# Patient Record
Sex: Female | Born: 1945 | Race: White | Hispanic: No | Smoking: Former smoker
Health system: Southern US, Community
[De-identification: ages and names within clinical notes are randomized; demographics above are authoritative.]

## PROBLEM LIST (undated history)

## (undated) DIAGNOSIS — J189 Pneumonia, unspecified organism: Secondary | ICD-10-CM

## (undated) DIAGNOSIS — F329 Major depressive disorder, single episode, unspecified: Secondary | ICD-10-CM

## (undated) DIAGNOSIS — M1990 Unspecified osteoarthritis, unspecified site: Secondary | ICD-10-CM

## (undated) DIAGNOSIS — T8859XA Other complications of anesthesia, initial encounter: Secondary | ICD-10-CM

## (undated) DIAGNOSIS — E785 Hyperlipidemia, unspecified: Secondary | ICD-10-CM

## (undated) DIAGNOSIS — Z9989 Dependence on other enabling machines and devices: Secondary | ICD-10-CM

## (undated) DIAGNOSIS — D369 Benign neoplasm, unspecified site: Secondary | ICD-10-CM

## (undated) DIAGNOSIS — E041 Nontoxic single thyroid nodule: Secondary | ICD-10-CM

## (undated) DIAGNOSIS — M5030 Other cervical disc degeneration, unspecified cervical region: Secondary | ICD-10-CM

## (undated) DIAGNOSIS — G4733 Obstructive sleep apnea (adult) (pediatric): Secondary | ICD-10-CM

## (undated) DIAGNOSIS — F32A Depression, unspecified: Secondary | ICD-10-CM

## (undated) DIAGNOSIS — G8929 Other chronic pain: Secondary | ICD-10-CM

## (undated) DIAGNOSIS — Z9181 History of falling: Secondary | ICD-10-CM

## (undated) DIAGNOSIS — T4145XA Adverse effect of unspecified anesthetic, initial encounter: Secondary | ICD-10-CM

## (undated) DIAGNOSIS — M549 Dorsalgia, unspecified: Secondary | ICD-10-CM

## (undated) DIAGNOSIS — S060X9A Concussion with loss of consciousness of unspecified duration, initial encounter: Secondary | ICD-10-CM

## (undated) DIAGNOSIS — E119 Type 2 diabetes mellitus without complications: Secondary | ICD-10-CM

## (undated) DIAGNOSIS — K219 Gastro-esophageal reflux disease without esophagitis: Secondary | ICD-10-CM

## (undated) DIAGNOSIS — S060XAA Concussion with loss of consciousness status unknown, initial encounter: Secondary | ICD-10-CM

## (undated) DIAGNOSIS — I4891 Unspecified atrial fibrillation: Secondary | ICD-10-CM

## (undated) DIAGNOSIS — R42 Dizziness and giddiness: Secondary | ICD-10-CM

## (undated) DIAGNOSIS — Z8489 Family history of other specified conditions: Secondary | ICD-10-CM

## (undated) DIAGNOSIS — K900 Celiac disease: Secondary | ICD-10-CM

## (undated) DIAGNOSIS — K811 Chronic cholecystitis: Secondary | ICD-10-CM

## (undated) HISTORY — DX: Hyperlipidemia, unspecified: E78.5

## (undated) HISTORY — DX: Celiac disease: K90.0

## (undated) HISTORY — PX: CARPAL TUNNEL RELEASE: SHX101

## (undated) HISTORY — PX: BACK SURGERY: SHX140

## (undated) HISTORY — DX: Benign neoplasm, unspecified site: D36.9

## (undated) HISTORY — DX: Other cervical disc degeneration, unspecified cervical region: M50.30

## (undated) HISTORY — DX: Unspecified atrial fibrillation: I48.91

## (undated) HISTORY — DX: Gastro-esophageal reflux disease without esophagitis: K21.9

## (undated) HISTORY — DX: Nontoxic single thyroid nodule: E04.1

## (undated) HISTORY — PX: CATARACT EXTRACTION, BILATERAL: SHX1313

---

## 1979-02-09 HISTORY — PX: TUBAL LIGATION: SHX77

## 1983-05-05 HISTORY — PX: TONSILLECTOMY: SUR1361

## 2005-05-04 HISTORY — PX: LUMBAR DISC SURGERY: SHX700

## 2007-03-22 ENCOUNTER — Encounter: Admission: RE | Admit: 2007-03-22 | Discharge: 2007-03-22 | Payer: Self-pay | Admitting: Family Medicine

## 2009-11-01 DIAGNOSIS — F411 Generalized anxiety disorder: Secondary | ICD-10-CM | POA: Insufficient documentation

## 2010-05-24 ENCOUNTER — Encounter: Payer: Self-pay | Admitting: Family Medicine

## 2011-06-19 DIAGNOSIS — E119 Type 2 diabetes mellitus without complications: Secondary | ICD-10-CM | POA: Insufficient documentation

## 2012-01-25 ENCOUNTER — Other Ambulatory Visit: Payer: Self-pay | Admitting: Nurse Practitioner

## 2012-01-25 DIAGNOSIS — Z1231 Encounter for screening mammogram for malignant neoplasm of breast: Secondary | ICD-10-CM

## 2012-02-01 ENCOUNTER — Ambulatory Visit
Admission: RE | Admit: 2012-02-01 | Discharge: 2012-02-01 | Disposition: A | Discharge status: Home / Self Care | Payer: PRIVATE HEALTH INSURANCE | Source: Ambulatory Visit | Attending: Nurse Practitioner | Admitting: Nurse Practitioner

## 2012-02-01 DIAGNOSIS — Z1231 Encounter for screening mammogram for malignant neoplasm of breast: Secondary | ICD-10-CM

## 2013-05-04 HISTORY — PX: ANTERIOR CERVICAL DECOMP/DISCECTOMY FUSION: SHX1161

## 2013-07-10 ENCOUNTER — Other Ambulatory Visit: Payer: Self-pay

## 2013-07-11 ENCOUNTER — Other Ambulatory Visit: Payer: Self-pay | Admitting: *Deleted

## 2013-07-11 DIAGNOSIS — N644 Mastodynia: Secondary | ICD-10-CM

## 2013-07-13 ENCOUNTER — Ambulatory Visit
Admission: RE | Admit: 2013-07-13 | Discharge: 2013-07-13 | Disposition: A | Discharge status: Home / Self Care | Payer: Medicare Other | Source: Ambulatory Visit | Attending: *Deleted | Admitting: *Deleted

## 2013-07-13 DIAGNOSIS — N644 Mastodynia: Secondary | ICD-10-CM

## 2013-10-27 DIAGNOSIS — G4733 Obstructive sleep apnea (adult) (pediatric): Secondary | ICD-10-CM | POA: Insufficient documentation

## 2013-12-04 ENCOUNTER — Ambulatory Visit: Payer: Medicare Other | Attending: Orthopaedic Surgery

## 2013-12-04 DIAGNOSIS — G4730 Sleep apnea, unspecified: Secondary | ICD-10-CM | POA: Diagnosis not present

## 2013-12-04 DIAGNOSIS — M79609 Pain in unspecified limb: Secondary | ICD-10-CM | POA: Diagnosis not present

## 2013-12-04 DIAGNOSIS — Z981 Arthrodesis status: Secondary | ICD-10-CM | POA: Diagnosis not present

## 2013-12-04 DIAGNOSIS — E119 Type 2 diabetes mellitus without complications: Secondary | ICD-10-CM | POA: Diagnosis not present

## 2013-12-04 DIAGNOSIS — R5381 Other malaise: Secondary | ICD-10-CM | POA: Insufficient documentation

## 2013-12-04 DIAGNOSIS — IMO0001 Reserved for inherently not codable concepts without codable children: Secondary | ICD-10-CM | POA: Diagnosis present

## 2013-12-04 DIAGNOSIS — R262 Difficulty in walking, not elsewhere classified: Secondary | ICD-10-CM | POA: Insufficient documentation

## 2013-12-04 DIAGNOSIS — M25579 Pain in unspecified ankle and joints of unspecified foot: Secondary | ICD-10-CM | POA: Diagnosis not present

## 2013-12-06 ENCOUNTER — Ambulatory Visit: Payer: Medicare Other | Admitting: Physical Therapy

## 2013-12-13 ENCOUNTER — Ambulatory Visit: Payer: Medicare Other | Admitting: Physical Therapy

## 2013-12-13 DIAGNOSIS — IMO0001 Reserved for inherently not codable concepts without codable children: Secondary | ICD-10-CM | POA: Diagnosis not present

## 2013-12-26 ENCOUNTER — Ambulatory Visit: Payer: Medicare Other

## 2013-12-26 DIAGNOSIS — IMO0001 Reserved for inherently not codable concepts without codable children: Secondary | ICD-10-CM | POA: Diagnosis not present

## 2013-12-29 ENCOUNTER — Ambulatory Visit: Payer: Medicare Other | Admitting: Physical Therapy

## 2013-12-29 DIAGNOSIS — IMO0001 Reserved for inherently not codable concepts without codable children: Secondary | ICD-10-CM | POA: Diagnosis not present

## 2014-01-02 ENCOUNTER — Ambulatory Visit: Payer: Medicare Other | Attending: Orthopaedic Surgery | Admitting: Physical Therapy

## 2014-01-02 DIAGNOSIS — M79609 Pain in unspecified limb: Secondary | ICD-10-CM | POA: Insufficient documentation

## 2014-01-02 DIAGNOSIS — R5381 Other malaise: Secondary | ICD-10-CM | POA: Diagnosis not present

## 2014-01-02 DIAGNOSIS — R262 Difficulty in walking, not elsewhere classified: Secondary | ICD-10-CM | POA: Insufficient documentation

## 2014-01-02 DIAGNOSIS — IMO0001 Reserved for inherently not codable concepts without codable children: Secondary | ICD-10-CM | POA: Diagnosis present

## 2014-01-02 DIAGNOSIS — G4730 Sleep apnea, unspecified: Secondary | ICD-10-CM | POA: Insufficient documentation

## 2014-01-02 DIAGNOSIS — E119 Type 2 diabetes mellitus without complications: Secondary | ICD-10-CM | POA: Insufficient documentation

## 2014-01-02 DIAGNOSIS — Z981 Arthrodesis status: Secondary | ICD-10-CM | POA: Diagnosis not present

## 2014-01-02 DIAGNOSIS — M25579 Pain in unspecified ankle and joints of unspecified foot: Secondary | ICD-10-CM | POA: Diagnosis not present

## 2014-03-06 ENCOUNTER — Other Ambulatory Visit: Payer: Self-pay | Admitting: Physician Assistant

## 2014-03-06 DIAGNOSIS — R5381 Other malaise: Secondary | ICD-10-CM

## 2014-03-07 ENCOUNTER — Other Ambulatory Visit: Payer: Self-pay | Admitting: Physician Assistant

## 2014-03-07 DIAGNOSIS — Z780 Asymptomatic menopausal state: Secondary | ICD-10-CM

## 2014-05-25 ENCOUNTER — Other Ambulatory Visit: Payer: Medicare Other

## 2014-06-12 ENCOUNTER — Ambulatory Visit
Admission: RE | Admit: 2014-06-12 | Discharge: 2014-06-12 | Disposition: A | Discharge status: Home / Self Care | Payer: Medicare Other | Source: Ambulatory Visit | Attending: Physician Assistant | Admitting: Physician Assistant

## 2014-06-12 DIAGNOSIS — Z780 Asymptomatic menopausal state: Secondary | ICD-10-CM

## 2014-08-03 ENCOUNTER — Other Ambulatory Visit: Payer: Self-pay

## 2014-08-03 DIAGNOSIS — Z1231 Encounter for screening mammogram for malignant neoplasm of breast: Secondary | ICD-10-CM

## 2014-09-05 ENCOUNTER — Ambulatory Visit: Payer: Medicare Other

## 2014-10-03 ENCOUNTER — Ambulatory Visit: Payer: Medicare Other

## 2014-10-30 ENCOUNTER — Ambulatory Visit
Admission: RE | Admit: 2014-10-30 | Discharge: 2014-10-30 | Disposition: A | Discharge status: Home / Self Care | Payer: Medicare Other | Source: Ambulatory Visit

## 2014-10-30 DIAGNOSIS — Z1231 Encounter for screening mammogram for malignant neoplasm of breast: Secondary | ICD-10-CM

## 2014-11-09 ENCOUNTER — Other Ambulatory Visit: Payer: Self-pay | Admitting: Neurosurgery

## 2014-11-09 DIAGNOSIS — M5126 Other intervertebral disc displacement, lumbar region: Secondary | ICD-10-CM

## 2014-11-13 ENCOUNTER — Ambulatory Visit
Admission: RE | Admit: 2014-11-13 | Discharge: 2014-11-13 | Disposition: A | Discharge status: Home / Self Care | Payer: Medicare Other | Source: Ambulatory Visit | Attending: Neurosurgery | Admitting: Neurosurgery

## 2014-11-13 DIAGNOSIS — M5126 Other intervertebral disc displacement, lumbar region: Secondary | ICD-10-CM

## 2014-11-13 MED ORDER — GADOBENATE DIMEGLUMINE 529 MG/ML IV SOLN
15.0000 mL | Freq: Once | INTRAVENOUS | Status: AC | PRN
  Administered 2014-11-13: 15 mL via INTRAVENOUS

## 2014-12-13 ENCOUNTER — Ambulatory Visit: Payer: Medicare Other | Attending: Neurosurgery

## 2014-12-13 DIAGNOSIS — M25659 Stiffness of unspecified hip, not elsewhere classified: Secondary | ICD-10-CM | POA: Diagnosis present

## 2014-12-13 DIAGNOSIS — M5441 Lumbago with sciatica, right side: Secondary | ICD-10-CM

## 2014-12-13 NOTE — Patient Instructions (Signed)
Perform all exercises below:  Hold _20___ seconds. Repeat _3___ times.  Do __3__ sessions per day. CAUTION: Movement should be gentle, steady and slow.  Knee to Chest  Lying supine, bend involved knee to chest. Perform with each leg.  Copyright  VHI. All rights reserved.  Double Knee to Chest (Flexion)   Gently pull both knees toward chest. Feel stretch in lower back or buttock area. Breathing deeply, Lumbar Rotation: Caudal - Bilateral (Supine)  Feet and knees together, arms outstretched, rotate knees left, turning head in opposite direction, until stretch is felt.      HIP: Hamstrings - Short Sitting   Rest leg on raised surface. Keep knee straight. Lift chest.   [facility] [address], [place], [place] [address] Phone # [phone] Fax [phone]

## 2014-12-13 NOTE — Therapy (Signed)
[facility] [address] [place], [place], [address] Phone: [phone]   Fax:  [phone]  Physical Therapy Evaluation  Patient Details  Name: Krista Austin MRN: [id number] Date of Birth: 1946/02/11 Referring Provider:  Kary Kos, MD  Encounter Date: 12/13/2014      PT End of Session - 12/13/14 1300    Visit Number 1   Number of Visits 10   Date for PT Re-Evaluation 02/07/15   PT Start Time 9417   PT Stop Time 1310   PT Time Calculation (min) 39 min   Activity Tolerance Patient tolerated treatment well   Behavior During Therapy [facility] for tasks assessed/performed      Past Medical History  Diagnosis Date  . Cataract   . Hyperlipidemia     Past Surgical History  Procedure Laterality Date  . Spine surgery  2007    lumbar  . Spine surgery  2015    cervical fusion    There were no vitals filed for this visit.  Visit Diagnosis:  Bilateral low back pain with right-sided sciatica - Plan: PT plan of care cert/re-cert  Hip stiffness, unspecified laterality - Plan: PT plan of care cert/re-cert      Subjective Assessment - 12/13/14 1230    Subjective Pt presents to PT with LBP and Rt LE radiculopathy.  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  MD recommended injection but pt does not want to do this due to her diabetes.     Patient is accompained by: Family member   Pertinent History Lumbar surgery 2007, neck surgery 2015   Limitations Standing;Walking   How long can you stand comfortably? 1 hour max with housework   How long can you walk comfortably? walking aggravates the pain   Diagnostic tests see MRI results   Currently in Pain? Yes   Pain Score 4   2-4/10   Pain Location Back   Pain Orientation Right;Lower;Lateral   Pain Descriptors / Indicators Constant;Burning;Tingling   Pain Type Chronic pain   Pain Radiating Towards Rt LE-nerve  pain (burning, tingling)   Pain Onset More than a month ago   Pain Frequency Constant   Aggravating Factors  standing, walking, standing on Rt LE   Pain Relieving Factors sitting down, Advil   Effect of Pain on Daily Activities difficulty with preparing meals, not able to walk for exercise, limited community function due to pain   Multiple Pain Sites No            [facility] PT Assessment - 12/13/14 0001    Assessment   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 2  unexplained, just happened   Has the patient had a decrease in activity level because of a fear of falling?  No   Is the patient reluctant to leave their home because of a fear of falling?  No   Home Environment   Living Environment Private residence   Living Arrangements Other relatives   Type of [place] Two level   Prior Function   Level of Independence Independent   Vocation Retired  Vocation Requirements Pt helps to care for her grandchildren.  Pt has an adult child with Down's Syndrome that she cares for.   Cognition   Overall Cognitive Status Within Functional Limits for tasks assessed   Observation/Other Assessments   Focus on Therapeutic Outcomes (FOTO)  50% limitation   Posture/Postural Control   Posture/Postural Control Postural limitations   Postural Limitations Rounded Shoulders;Forward head;Decreased lumbar lordosis   ROM / Strength   AROM / PROM / Strength AROM;PROM;Strength   AROM   Overall AROM  Within functional limits for tasks performed   Overall AROM Comments Pt with full lumbar AROM with Rt lumbar pain reported with Lt sidbending and flexion   PROM   Overall PROM  Deficits   Overall PROM Comments Bil. hip flexibility limited  by 50% in all directions without pain   Strength   Overall Strength Within functional limits for tasks performed   Overall Strength Comments 4+/5 to 5/5 bilateral LE strength   Palpation   Spinal mobility reduced PA mobility with pain T3-L5.     Palpation comment Pt with tenderness and muscle tension over bilateral thoracic, lumbar and deep gluteal musculaure   Special Tests    Special Tests Lumbar   Lumbar Tests Slump Test;Straight Leg Raise   Slump test   Findings Negative   Side Right   Straight Leg Raise   Findings Negative   Side  Right   Ambulation/Gait   Ambulation/Gait Yes   Ambulation/Gait Assistance 7: Independent                           PT Education - 12/13/14 1259    Education provided Yes   Education Details HEP: lumbar flexibility exercises,  discussed home TENs   Person(s) Educated Patient   Methods Explanation;Demonstration;Handout   Comprehension Verbalized understanding;Returned demonstration          PT Short Term Goals - 12/13/14 1315    PT SHORT TERM GOAL #1   Title be independent in initial HEP   Time 4   Period Weeks   Status New   PT SHORT TERM GOAL #2   Title report a 30% reduction in LBP/LE pain with standing to perform cooking and housework   Time 4   Period Weeks   Status New   PT SHORT TERM GOAL #3   Title resume regular walking program for exercise without increase in pain   Time 4   Period Weeks   Status New           PT Long Term Goals - 12/13/14 1318    PT LONG TERM GOAL #1   Title be independent in advanced HEP   Time 8   Period Weeks   Status New   PT LONG TERM GOAL #2   Title reduce FOTO to < or = to 45% limitation   Time 8   Period Weeks   Status New   PT LONG TERM GOAL #3   Title report a 60% reduction in LBP/LE pain with housework and cooking tasks   Time 8   Period Weeks   Status New   PT LONG TERM GOAL #4   Title walk in the community x30-45 minutes for shopping/errands with < or = to  2/10 LBP   Time 8   Period Weeks   Status New               Plan - 12/13/14 1300  Clinical Impression Statement Pt is a y.o. female who presents to PT with chronic history of LBP and radiculopathy.  Recent MRI indicated lumbar degeneration, nerve root impingement and stenosis.  Pt demonstrates bilateral hip stiffness, painful lumbar flexiblity and reduced spinal segmental mobility.  FOTO score is 50% limitation and pt is limited to standing long periods and performing home and community function.  Pt will beneit from PT for flexiblity, core stability, body mechanics educaiton, and modalities/manual PRN   Pt will benefit from skilled therapeutic intervention in order to improve on the following deficits Hypomobility;Pain;Decreased activity tolerance;Impaired flexibility   Rehab Potential Good   PT Frequency 2x / week   PT Duration 8 weeks   PT Treatment/Interventions ADLs/Self Care Home Management;Cryotherapy;Electrical Stimulation;Moist Heat;Traction;Ultrasound;Patient/family education;Neuromuscular re-education;Therapeutic exercise;Therapeutic activities;Manual techniques;Passive range of motion   PT Next Visit Plan body mechanics, decompression exercise, hip and lumbar flexibility, try e-stim and heat   Consulted and Agree with Plan of Care Family member/caregiver   Family Member Consulted Pt's son          G-Codes - Dec 26, 2014 1226    Functional Assessment Tool Used FOTO: 50% limitation   Functional Limitation Other PT primary   Other PT Primary Current Status (M0802) At least 40 percent but less than 60 percent impaired, limited or restricted   Other PT Primary Goal Status (M3361) At least 40 percent but less than 60 percent impaired, limited or restricted       Problem List There are no active problems to display for this patient.   TAKACS,KELLY, PT December 26, 2014, 1:22 PM  [facility] [address], [place], [place], [address] Phone: [phone]   Fax:  [phone]

## 2014-12-19 ENCOUNTER — Ambulatory Visit: Payer: Medicare Other

## 2014-12-27 ENCOUNTER — Encounter: Payer: Medicare Other | Admitting: Physical Therapy

## 2015-01-11 ENCOUNTER — Ambulatory Visit: Payer: Medicare Other | Admitting: Physical Therapy

## 2015-01-14 ENCOUNTER — Encounter: Payer: Medicare Other | Admitting: Physical Therapy

## 2015-01-16 ENCOUNTER — Ambulatory Visit: Payer: Medicare Other | Attending: Neurosurgery

## 2015-01-16 DIAGNOSIS — M25659 Stiffness of unspecified hip, not elsewhere classified: Secondary | ICD-10-CM | POA: Diagnosis present

## 2015-01-16 DIAGNOSIS — R51 Headache: Secondary | ICD-10-CM | POA: Diagnosis present

## 2015-01-16 DIAGNOSIS — M542 Cervicalgia: Secondary | ICD-10-CM | POA: Diagnosis present

## 2015-01-16 DIAGNOSIS — R293 Abnormal posture: Secondary | ICD-10-CM | POA: Insufficient documentation

## 2015-01-16 DIAGNOSIS — M5441 Lumbago with sciatica, right side: Secondary | ICD-10-CM

## 2015-01-16 NOTE — Therapy (Signed)
[facility] [address] [place], [place], [address] Phone: [phone]   Fax:  [phone]  Physical Therapy Treatment  Patient Details  Name: Krista Austin MRN: [id number] Date of Birth: [birth year] Referring Provider:  Kary Kos, MD  Encounter Date: 01/16/2015      PT End of Session - 01/16/15 1214    Visit Number 2   Number of Visits 10   Date for PT Re-Evaluation 02/07/15   PT Start Time 5638   PT Stop Time 7564   PT Time Calculation (min) 50 min   Activity Tolerance Patient tolerated treatment well   Behavior During Therapy [facility] for tasks assessed/performed      Past Medical History  Diagnosis Date  . Cataract   . Hyperlipidemia     Past Surgical History  Procedure Laterality Date  . Spine surgery  2007    lumbar  . Spine surgery  2015    cervical fusion    There were no vitals filed for this visit.  Visit Diagnosis:  Bilateral low back pain with right-sided sciatica  Hip stiffness, unspecified laterality      Subjective Assessment - 01/16/15 1142    Subjective Pt has not been able to attend PT due to death in the family and then had elevated A1C.     Currently in Pain? Yes   Pain Score 2    Pain Location Back   Pain Orientation Right;Lateral;Lower   Pain Descriptors / Indicators Constant;Burning;Tingling   Pain Radiating Towards Rt LE nerve pain (burning, tingling)   Pain Onset More than a month ago   Pain Frequency Constant   Aggravating Factors  standing, walking, standing on Rt LE   Pain Relieving Factors sitting down, Advil                         [facility] Adult PT Treatment/Exercise - 01/16/15 0001    Exercises   Exercises Lumbar;Knee/Hip   Lumbar Exercises: Stretches   Active Hamstring Stretch 3 reps;20 seconds   Single Knee to Chest Stretch 3 reps;20 seconds   Double Knee to Chest Stretch 3 reps;20 seconds   Lower Trunk Rotation 3 reps;20 seconds   Knee/Hip  Exercises: Aerobic   Nustep Level 1 x 8 minutes  seat 7, arms 10   Modalities   Modalities Electrical Stimulation;Ultrasound;Moist Heat   Moist Heat Therapy   Number Minutes Moist Heat 15 Minutes   Moist Heat Location Lumbar Spine   Electrical Stimulation   Electrical Stimulation Location Rt low back/SI joint   Electrical Stimulation Action IFC   Electrical Stimulation Parameters 15 min   Electrical Stimulation Goals Pain   Ultrasound   Ultrasound Location Rt SI joint/lumbar paraspinals   Ultrasound Parameters 1.3 w/cm2 contx 8 mintues   Ultrasound Goals Pain                  PT Short Term Goals - 01/16/15 1148    PT SHORT TERM GOAL #1   Title be independent in initial HEP   Time 4   Period Weeks   Status Achieved   PT SHORT TERM GOAL #2   Title report a 30% reduction in LBP/LE pain with standing to perform cooking and housework   Time 4   Period Weeks   Status On-going  no change due to inability to to HEP or attend PT   PT SHORT TERM GOAL #3   Title resume regular  walking program for exercise without increase in pain   Time 4   Period Weeks   Status On-going  walking shorter distances more frequently but still limited by pain           PT Long Term Goals - 24-Dec-2014 1318    PT LONG TERM GOAL #1   Title be independent in advanced HEP   Time 8   Period Weeks   Status New   PT LONG TERM GOAL #2   Title reduce FOTO to < or = to 45% limitation   Time 8   Period Weeks   Status New   PT LONG TERM GOAL #3   Title report a 60% reduction in LBP/LE pain with housework and cooking tasks   Time 8   Period Weeks   Status New   PT LONG TERM GOAL #4   Title walk in the community x30-45 minutes for shopping/errands with < or = to 2/10 LBP   Time 8   Period Weeks   Status New               Plan - Jan 27, 2015 1150    Clinical Impression Statement Pt with limited attendance 12-24-2014-2014/01/26 due to death in the familiy and other medical issues.  Pt  reports limited ability to perform HEP.  Pt with continued Rt LE and low back pain.  Pt will benefit from skilled PT for flexibility, core stability, body mechanics educaiton and modalities/manual.     Pt will benefit from skilled therapeutic intervention in order to improve on the following deficits Hypomobility;Pain;Decreased activity tolerance;Impaired flexibility   Rehab Potential Good   PT Frequency 2x / week   PT Duration 8 weeks   PT Treatment/Interventions ADLs/Self Care Home Management;Cryotherapy;Electrical Stimulation;Moist Heat;Traction;Ultrasound;Patient/family education;Neuromuscular re-education;Therapeutic exercise;Therapeutic activities;Manual techniques;Passive range of motion   PT Next Visit Plan body mechanics, decompression exercise, hip and lumbar flexibility, modalities PRN   Consulted and Agree with Plan of Care Patient        Problem List There are no active problems to display for this patient.   Deanna Boehlke, PT 27-Jan-2015, 12:21 PM  [facility] [address], [place], [place], [address] Phone: [phone]   Fax:  [phone]

## 2015-01-17 ENCOUNTER — Encounter: Payer: Medicare Other | Admitting: Physical Therapy

## 2015-01-18 ENCOUNTER — Other Ambulatory Visit: Payer: Self-pay | Admitting: Neurosurgery

## 2015-01-18 DIAGNOSIS — M5023 Other cervical disc displacement, cervicothoracic region: Secondary | ICD-10-CM

## 2015-01-22 ENCOUNTER — Ambulatory Visit: Payer: Medicare Other

## 2015-01-22 DIAGNOSIS — R293 Abnormal posture: Secondary | ICD-10-CM

## 2015-01-22 DIAGNOSIS — M5441 Lumbago with sciatica, right side: Secondary | ICD-10-CM | POA: Diagnosis not present

## 2015-01-22 DIAGNOSIS — M542 Cervicalgia: Secondary | ICD-10-CM

## 2015-01-22 DIAGNOSIS — R519 Headache, unspecified: Secondary | ICD-10-CM

## 2015-01-22 DIAGNOSIS — R51 Headache: Secondary | ICD-10-CM

## 2015-01-22 DIAGNOSIS — M25659 Stiffness of unspecified hip, not elsewhere classified: Secondary | ICD-10-CM

## 2015-01-22 NOTE — Therapy (Signed)
[facility] [address], [place], [place], [address] Phone: [phone]   Fax:  [phone]  Physical Therapy Treatment  Patient Details  Name: Krista Austin MRN: [id number] Date of Birth: 05/20/1945 Referring Provider:  Kary Kos, MD  Encounter Date: 01/22/2015      PT End of Session - 01/22/15 1304    Visit Number 3   Number of Visits 10   Date for PT Re-Evaluation 03/19/15   PT Start Time 7253   PT Stop Time 1315   PT Time Calculation (min) 44 min   Activity Tolerance Patient tolerated treatment well   Behavior During Therapy [facility] for tasks assessed/performed      Past Medical History  Diagnosis Date  . Cataract   . Hyperlipidemia     Past Surgical History  Procedure Laterality Date  . Spine surgery  2007    lumbar  . Spine surgery  2015    cervical fusion    There were no vitals filed for this visit.  Visit Diagnosis:  Bilateral low back pain with right-sided sciatica - Plan: PT plan of care cert/re-cert  Hip stiffness, unspecified laterality - Plan: PT plan of care cert/re-cert  Cervicalgia - Plan: PT plan of care cert/re-cert  Posture abnormality - Plan: PT plan of care cert/re-cert  Bilateral headaches - Plan: PT plan of care cert/re-cert      Subjective Assessment - 01/22/15 1225    Subjective Order received for cervical spine.  Pt with chronic neck pain and has fusion last year.  Pt will have neck MRI next week   Pertinent History Lumbar surgery 2007, neck surgery 2015   Diagnostic tests MRI of neck scheduled for 01/30/15   Patient Stated Goals reduce pain, reduce headaches   Currently in Pain? Yes   Pain Score 1    Pain Orientation Right;Left;Lower   Pain Descriptors / Indicators Constant;Burning;Tingling   Pain Type Chronic pain   Pain Onset More than a month ago   Pain Frequency Constant   Aggravating Factors  standing, walking, standing on Rt LE   Pain Relieving Factors  sitting down, Advil   Multiple Pain Sites Yes   Pain Score 1   Pain Location Neck   Pain Orientation Right;Left;Mid   Pain Descriptors / Indicators Aching;Burning   Pain Radiating Towards between shoulder blades.  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 2  unexplained   Has the patient had a decrease in activity level because of a fear of falling?  No   Is the patient reluctant to leave their home because of a fear of falling?  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  Pt has an adult child with Down's Syndrome that she cares for.   Cognition   Overall Cognitive Status Within Functional Limits for tasks assessed   Observation/Other Assessments   Focus on Therapeutic Outcomes (FOTO)  54% limitation  neck   Posture/Postural Control   Posture/Postural Control Postural limitations   Postural Limitations Rounded Shoulders;Forward head;Decreased lumbar lordosis   ROM / Strength   AROM / PROM / Strength AROM;Strength   AROM   Overall AROM  Deficits   Overall AROM Comments Cervical AROM is limited by 25% in all directions and pt reports feeling dizzy with head motion   Strength   Overall Strength Within functional limits for tasks performed   Overall Strength Comments 4+/5 bilateral UE strength    Palpation   Spinal mobility reduced PA mobilty C4-T3   Palpation comment tension and active trigger point over bilateral UT and cervical paraspinals                     [facility] Adult PT Treatment/Exercise - 01/22/15 0001    Modalities   Modalities Electrical Stimulation;Moist Heat   Moist Heat Therapy   Number Minutes Moist Heat 15 Minutes   Moist Heat Location Lumbar Spine;Cervical   Electrical Stimulation   Electrical Stimulation Location bil low back and cervical paraspinals   Electrical Stimulation Action IFC   Electrical Stimulation Parameters 15 min   Electrical Stimulation Goals Pain                PT Education - 01/22/15 1250    Education provided Yes   Education Details HEP: cervical AROM, posture education   Person(s) Educated Patient   Methods Demonstration;Explanation;Handout   Comprehension Verbalized understanding;Returned demonstration          PT Short Term Goals - 01/22/15 1235    PT SHORT TERM GOAL #1   Title be independent in initial HEP   Status Achieved   PT SHORT TERM GOAL #2   Title report a 30% reduction in LBP/LE pain with standing to perform cooking and housework   Time 4   Period Weeks   Status Achieved   PT SHORT TERM GOAL #3   Title resume regular walking program for exercise without increase in pain   Status Achieved  short bouts           PT Long Term Goals - 01/22/15 1236    PT LONG TERM GOAL #1   Title be independent in advanced HEP   Time 8   Period Weeks   Status On-going   PT LONG TERM GOAL #2   Title reduce FOTO to < or = to 45% limitation for lumbar spine   Time 8   Period Weeks   Status On-going   PT LONG TERM GOAL #3   Title report a 60% reduction in LBP/LE pain with housework and cooking tasks   Time 8   Period Weeks   Status On-going  50% report   PT LONG TERM GOAL #4   Title walk in the community x30-45 minutes for shopping/errands with < or = to 2/10 LBP   Time 8   Period Weeks    Status On-going  LBP 3-4/10 after an hour   PT LONG TERM GOAL #5   Title report a 50% reduction in the frequency and intensity of headaches    Time 8   Period Weeks   Status New   Additional Long Term Goals  Additional Long Term Goals Yes   PT LONG TERM GOAL #6   Title reduce FOTO to < or 47% limitation (neck)   Time 8   Period Weeks   Status New               Plan - 02/14/15 1304    Clinical Impression Statement Pt presents to PT with chronic neck pain and headaches that have recently worsened with onset of some dizziness.  Pt will have MRI next week.  Pt also wit hcontniued Rt LE pain and low back pain that is 50% improved since the start of care.  Pt demonstrates postural abnormality and weakness and limited cervical AROM.  FOTO for neck is 54% limitaiton.  Pt will benefit from skilled PT for cervical pain management, postural strength and advancement of exercise for low back/Rt LE pain as needed.     Pt will benefit from skilled therapeutic intervention in order to improve on the following deficits Hypomobility;Pain;Decreased activity tolerance;Impaired flexibility;Postural dysfunction;Decreased strength   Rehab Potential Good   PT Frequency 2x / week   PT Duration 8 weeks   PT Treatment/Interventions ADLs/Self Care Home Management;Cryotherapy;Electrical Stimulation;Moist Heat;Traction;Ultrasound;Patient/family education;Neuromuscular re-education;Therapeutic exercise;Therapeutic activities;Manual techniques;Passive range of motion   PT Next Visit Plan  decompression exercise, postural strength, hip and lumbar flexibility, modalities PRN   Consulted and Agree with Plan of Care Patient          G-Codes - 2015-02-14 1227    Functional Assessment Tool Used FOTO neck: 54% limitation   Functional Limitation Other PT subsequent   Other PT Secondary Current Status (D1761) At least 40 percent but less than 60 percent impaired, limited or restricted   Other PT Secondary Goal  Status (Y0737) At least 40 percent but less than 60 percent impaired, limited or restricted   Other PT Secondary Discharge Status (T0626) At least 40 percent but less than 60 percent impaired, limited or restricted      Problem List There are no active problems to display for this patient.   TAKACS,KELLY, PT 02/14/15, 1:10 PM  [facility] [address], [place], [place], [address] Phone: [phone]   Fax:  [phone]

## 2015-01-22 NOTE — Patient Instructions (Signed)
PERFORM ALL EXERCISES GENTLY AND WITH GOOD POSTURE.    20 SECOND HOLD, 3 REPS TO EACH SIDE. 4-5 TIMES EACH DAY.   AROM: Neck Rotation   Turn head slowly to look over one shoulder, then the other.   AROM: Neck Flexion   Bend head forward.   AROM: Lateral Neck Flexion   Slowly tilt head toward one shoulder, then the other.    Posture - Standing   Good posture is important. Avoid slouching and forward head thrust. Maintain curve in low back and align ears over shoulders, hips over ankles.  Pull your belly button in toward your back bone. Posture Tips DO: - stand tall and erect - keep chin tucked in - keep head and shoulders in alignment - check posture regularly in mirror or large window - pull head back against headrest in car seat;  Change your position often.  Sit with lumbar support. DON'T: - slouch or slump while watching TV or reading - sit, stand or lie in one position  for too long;  Sitting is especially hard on the spine so if you sit at a desk/use the computer, then stand up often! Copyright  VHI. All rights reserved.  Posture - Sitting  Sit upright, head facing forward. Try using a roll to support lower back. Keep shoulders relaxed, and avoid rounded back. Keep hips level with knees. Avoid crossing legs for long periods. Copyright  VHI. All rights reserved.  Chronic neck strain can develop because of poor posture and faulty work habits  Postural strain related to slumped sitting and forward head posture is a leading cause of headaches, neck and upper back pain  General strengthening and flexibility exercises are helpful in the treatment of neck pain.  Most importantly, you should learn to correct the posture that may be contributing to chronic pain.   Change positions frequently  Change your work or home environment to improve posture and mechanics.   [facility] [address], [address] [place],  [address] Phone # [phone] Fax  [phone] 

## 2015-01-29 ENCOUNTER — Ambulatory Visit: Payer: Medicare Other | Admitting: Physical Therapy

## 2015-01-29 ENCOUNTER — Encounter: Payer: Self-pay | Admitting: Physical Therapy

## 2015-01-29 DIAGNOSIS — R51 Headache: Secondary | ICD-10-CM

## 2015-01-29 DIAGNOSIS — M25659 Stiffness of unspecified hip, not elsewhere classified: Secondary | ICD-10-CM

## 2015-01-29 DIAGNOSIS — M5441 Lumbago with sciatica, right side: Secondary | ICD-10-CM

## 2015-01-29 DIAGNOSIS — R519 Headache, unspecified: Secondary | ICD-10-CM

## 2015-01-29 DIAGNOSIS — R293 Abnormal posture: Secondary | ICD-10-CM

## 2015-01-29 DIAGNOSIS — M542 Cervicalgia: Secondary | ICD-10-CM

## 2015-01-29 NOTE — Patient Instructions (Signed)
RE-ALIGNMENT ROUTINE EXERCISES-OSTEOPROROSIS BASIC FOR POSTURAL CORRECTION   RE-ALIGNMENT Tips BENEFITS: 1.It helps to re-align the curves of the back and improve standing posture. 2.It allows the back muscles to rest and strengthen in preparation for more activity. FREQUENCY: Daily, even after weeks, months and years of more advanced exercises. START: 1.All exercises start in the same position: lying on the back, arms resting on the supporting surface, palms up and slightly away from the body, backs of hands down, knees bent, feet flat. 2.The head, neck, arms, and legs are supported according to specific instructions of your therapist. Copyright  VHI. All rights reserved.    1. Decompression Exercise: Basic.   Takes compression off the vertebral bodies; increases tolerance for lying on the back; helps relieve back pain   Lie on back on firm surface, knees bent, feet flat, arms turned up, out to sides (~35 degrees). Head neck and arms supported as necessary. Time _5-15__ minutes. Surface: floor     2. Shoulder Press  Strengthens upper back extensors and scapular retractors.   Press both shoulders down. Hold  5 seconds. Repeat _5__ times. Surface: floor        3. Head Press With Perry  Strengthens neck extensors   Tuck chin SLIGHTLY toward chest, keep mouth closed. Feel weight on back of head. Increase weight by pressing head down. Hold _2-3__ seconds. Relax. Repeat 3-5___ times. Surface: floor   4. Leg Lengthener: stretches quadratus lumborum and hip flexors.  Strengthens quads and ankle dorsiflexors.   Straighten one leg. Pull toes AND forefoot toward knee, extend heel. Lengthen leg by pulling pelvis away from ribs. Hold 5  seconds. Relax. Repeat 1 time. Re-bend knee. Do other leg. Each leg 5  times. Surface: floor  Bed is fine  Copyright  VHI. All rights reserved.   5. Leg Press.   Strengthens gluteus maximus, lower erector spinae and ankle dorsiflexors.     Repeat the above motion (Leg lengthener) and press entire leg downward.  Imagine making an impression of the leg in the sand.  Hold  5 seconds and relax.  Repeat on the other leg.  Each leg 5  times.

## 2015-01-29 NOTE — Therapy (Signed)
[facility] [address] [place], [place], [address] Phone: [phone]   Fax:  [phone]  Physical Therapy Treatment  Patient Details  Name: Krista Austin MRN: [id number] Date of Birth: [birth year] Referring Provider:  Kary Kos, MD  Encounter Date: 01/29/2015      PT End of Session - 01/29/15 1054    Visit Number 4   Number of Visits 10   Date for PT Re-Evaluation 03/19/15   PT Start Time 1017   PT Stop Time 1100   PT Time Calculation (min) 45 min   Activity Tolerance Patient tolerated treatment well   Behavior During Therapy [facility] for tasks assessed/performed      Past Medical History  Diagnosis Date  . Cataract   . Hyperlipidemia     Past Surgical History  Procedure Laterality Date  . Spine surgery  2007    lumbar  . Spine surgery  2015    cervical fusion    There were no vitals filed for this visit.  Visit Diagnosis:  Bilateral low back pain with right-sided sciatica  Hip stiffness, unspecified laterality  Cervicalgia  Posture abnormality  Bilateral headaches      Subjective Assessment - 01/29/15 1022    Subjective My back is doing better pain rated as 1/10. My neck pain is up to 4/10. Pt will have MRI tomorrow and Neurosurgon appointment on Friday Sept 30th. Pt brought in her TENS unit   Currently in Pain? Yes   Pain Score 1    Pain Location Back   Pain Orientation Right;Left;Lower   Pain Descriptors / Indicators Constant   Pain Type Chronic pain   Pain Onset More than a month ago   Pain Frequency Constant   Multiple Pain Sites Yes   Pain Score 1  can go up to 3-4/10   Pain Location Neck   Pain Orientation Right;Left;Mid   Pain Descriptors / Indicators Aching;Burning   Pain Type Chronic pain   Pain Onset More than a month ago   Pain Frequency Constant                         [facility] Adult PT Treatment/Exercise - 01/29/15 0001    Bed Mobility   Bed Mobility  --  TENS pt educated and practiced use of her personal TENS unit   Posture/Postural Control   Posture/Postural Control Postural limitations   Postural Limitations Rounded Shoulders;Forward head;Decreased lumbar lordosis   Exercises   Exercises Neck   Lumbar Exercises: Supine   Other Supine Lumbar Exercises Decompression exercises pt with pillow and small towel under neck,  x 5 & 5 sec hold each exercis, needs vc's for technique   Knee/Hip Exercises: Aerobic   Nustep Level 1 x 6 minutes  Seat#5, arms #8   Knee/Hip Exercises: Standing   Other Standing Knee Exercises leaning on green physioball isometric contraction x 5 with 5 sec hold, squatting with TA activation x 5.                 PT Education - 01/29/15 1048    Education provided Yes   Education Details TENS Unit, Decompression exercises    Person(s) Educated Patient   Methods Demonstration;Explanation;Handout   Comprehension Verbalized understanding;Returned demonstration          PT Short Term Goals - 01/22/15 1235    PT SHORT TERM GOAL #1   Title be independent in initial HEP   Status  Achieved   PT SHORT TERM GOAL #2   Title report a 30% reduction in LBP/LE pain with standing to perform cooking and housework   Time 4   Period Weeks   Status Achieved   PT SHORT TERM GOAL #3   Title resume regular walking program for exercise without increase in pain   Status Achieved  short bouts           PT Long Term Goals - 01/29/15 1158    PT LONG TERM GOAL #1   Title be independent in advanced HEP   Time 8   Period Weeks   Status On-going   PT LONG TERM GOAL #2   Title reduce FOTO to < or = to 45% limitation for lumbar spine   Time 8   Period Weeks   Status On-going   PT LONG TERM GOAL #3   Title report a 60% reduction in LBP/LE pain with housework and cooking tasks   Time 8   Period Weeks   Status On-going   PT LONG TERM GOAL #4   Title walk in the community x30-45 minutes for shopping/errands with <  or = to 2/10 LBP   Time 8   Period Weeks   Status On-going   PT LONG TERM GOAL #5   Title report a 50% reduction in the frequency and intensity of headaches    Time 8   Period Weeks   Status On-going   PT LONG TERM GOAL #6   Title reduce FOTO to < or 47% limitation (neck)   Time 8   Period Weeks   Status On-going               Plan - 01/29/15 1054    Clinical Impression Statement Pt with chronic neck pain and headaches. Pt was able to participate well with strengthening    Pt will benefit from skilled therapeutic intervention in order to improve on the following deficits Hypomobility;Pain;Decreased activity tolerance;Impaired flexibility;Postural dysfunction;Decreased strength   Rehab Potential Good   PT Frequency 2x / week   PT Duration 6 weeks   PT Treatment/Interventions ADLs/Self Care Home Management;Cryotherapy;Electrical Stimulation;Moist Heat;Traction;Ultrasound;Patient/family education;Neuromuscular re-education;Therapeutic exercise;Therapeutic activities;Manual techniques;Passive range of motion   PT Next Visit Plan review decompression exercise, add postural strength, hip and lumbar flexibility, modalities PRN   Consulted and Agree with Plan of Care Patient        Problem List There are no active problems to display for this patient.   NAUMANN-HOUEGNIFIO,ELKE PTA 01/29/2015, 12:01 PM  [facility] [address], [place], [place], [address] Phone: [phone]   Fax:  [phone]

## 2015-01-30 ENCOUNTER — Ambulatory Visit
Admission: RE | Admit: 2015-01-30 | Discharge: 2015-01-30 | Disposition: A | Discharge status: Home / Self Care | Payer: Medicare Other | Source: Ambulatory Visit | Attending: Neurosurgery | Admitting: Neurosurgery

## 2015-01-30 DIAGNOSIS — M5023 Other cervical disc displacement, cervicothoracic region: Secondary | ICD-10-CM

## 2015-02-05 ENCOUNTER — Ambulatory Visit: Payer: Medicare Other | Attending: Neurosurgery

## 2015-02-05 DIAGNOSIS — R519 Headache, unspecified: Secondary | ICD-10-CM

## 2015-02-05 DIAGNOSIS — R51 Headache: Secondary | ICD-10-CM | POA: Insufficient documentation

## 2015-02-05 DIAGNOSIS — M5441 Lumbago with sciatica, right side: Secondary | ICD-10-CM | POA: Diagnosis present

## 2015-02-05 DIAGNOSIS — R293 Abnormal posture: Secondary | ICD-10-CM

## 2015-02-05 DIAGNOSIS — M542 Cervicalgia: Secondary | ICD-10-CM | POA: Insufficient documentation

## 2015-02-05 DIAGNOSIS — M25659 Stiffness of unspecified hip, not elsewhere classified: Secondary | ICD-10-CM | POA: Diagnosis present

## 2015-02-05 NOTE — Therapy (Signed)
[facility] [address], [place], [place], [address] Phone: [phone]   Fax:  [phone]  Physical Therapy Treatment  Patient Details  Name: Krista Austin MRN: [id number] Date of Birth: Apr 27, 1946 Referring Provider:  Kary Kos, MD  Encounter Date: 02/05/2015      PT End of Session - 02/05/15 1050    Visit Number 5   Number of Visits 10   Date for PT Re-Evaluation 03/19/15   PT Start Time 6256   PT Stop Time 1104   PT Time Calculation (min) 50 min   Activity Tolerance Patient tolerated treatment well   Behavior During Therapy [facility] for tasks assessed/performed      Past Medical History  Diagnosis Date  . Cataract   . Hyperlipidemia     Past Surgical History  Procedure Laterality Date  . Spine surgery  2007    lumbar  . Spine surgery  2015    cervical fusion    There were no vitals filed for this visit.  Visit Diagnosis:  Bilateral low back pain with right-sided sciatica  Hip stiffness, unspecified laterality  Cervicalgia  Posture abnormality  Bilateral headaches      Subjective Assessment - 02/05/15 1012    Subjective I've had a great week but woke up with increased pain in the low back.  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     Currently in Pain? Yes   Pain Score 3    Pain Location Back   Pain Descriptors / Indicators Sharp;Shooting   Pain Type Chronic pain   Pain Onset More than a month ago   Pain Frequency Constant   Aggravating Factors  woke up with LBP, activity, standing/walking   Pain Relieving Factors sitting down, heat, Advil, home TENs   Multiple Pain Sites Yes   Pain Score 1   Pain Location Neck   Pain Orientation Right;Left;Mid   Pain Type Chronic pain   Pain  Radiating Towards between shoulder blades   Pain Onset More than a month ago   Pain Frequency Constant                         [facility] Adult PT Treatment/Exercise - 02/05/15 0001    Neck Exercises: Seated   Other Seated Exercise cervical AROM 3 ways 3x20 seconds   Neck Exercises: Supine   Other Supine Exercise decompression, head press, shoulder press  good review of HEP   Modalities   Modalities Electrical Stimulation;Moist Heat   Moist Heat Therapy   Number Minutes Moist Heat 15 Minutes   Moist Heat Location Lumbar Spine;Cervical   Electrical Stimulation   Electrical Stimulation Location bil low back and cervical paraspinals   Electrical Stimulation Action IFC   Electrical Stimulation Parameters 15   Electrical Stimulation Goals Pain   Ultrasound   Ultrasound Location Rt SI joint and lumbar paraspinals   Ultrasound Parameters 1.3 w/cm2 cont x 8 minutes   Ultrasound Goals Pain                  PT Short Term Goals - 01/22/15 1235    PT SHORT TERM GOAL #1   Title be independent in initial HEP   Status Achieved   PT SHORT TERM GOAL #2   Title  report a 30% reduction in LBP/LE pain with standing to perform cooking and housework   Time 4   Period Weeks   Status Achieved   PT SHORT TERM GOAL #3   Title resume regular walking program for exercise without increase in pain   Status Achieved  short bouts           PT Long Term Goals - 02/05/15 1020    PT LONG TERM GOAL #1   Title be independent in advanced HEP   Time 8   Period Weeks   Status On-going   PT LONG TERM GOAL #2   Title reduce FOTO to < or = to 45% limitation for lumbar spine   Time 8   Period Weeks   Status On-going   PT LONG TERM GOAL #3   Title report a 60% reduction in LBP/LE pain with housework and cooking tasks   Time 8   Period Weeks   Status On-going   PT LONG TERM GOAL #5   Title report a 50% reduction in the frequency and intensity of headaches    Time 8   Period  Weeks   Status On-going  >75% reduction   PT LONG TERM GOAL #6   Title reduce FOTO to < or 47% limitation (neck)   Time 8   Period Weeks   Status On-going               Plan - 02/05/15 1024    Clinical Impression Statement Pt with flare up of LBP today due to waking up with sharp and stabbing pain in low back.  Pt with 75% reduction in headache frequency and intensity.  Pt reports that overall reduction in LBP by 50% prior to flare-up today.  Pt demonstrates tight musculature in the spine, painful mobility and will benefit from skilled PT for strength, flexibility, relaxation exercises and modalities PRN   Pt will benefit from skilled therapeutic intervention in order to improve on the following deficits Hypomobility;Pain;Decreased activity tolerance;Impaired flexibility;Postural dysfunction;Decreased strength   Rehab Potential Good   PT Frequency 2x / week   PT Duration 6 weeks   PT Treatment/Interventions ADLs/Self Care Home Management;Cryotherapy;Electrical Stimulation;Moist Heat;Traction;Ultrasound;Patient/family education;Neuromuscular re-education;Therapeutic exercise;Therapeutic activities;Manual techniques;Passive range of motion   PT Next Visit Plan  add postural strength, hip and lumbar flexibility, modalities PRN   Consulted and Agree with Plan of Care Patient        Problem List There are no active problems to display for this patient.   TAKACS,KELLY, PT 02/05/2015, 10:52 AM  [facility] [address] [place], [place], [address] Phone: [phone]   Fax:  [phone]

## 2015-02-27 ENCOUNTER — Ambulatory Visit: Payer: Medicare Other

## 2015-02-27 DIAGNOSIS — M5441 Lumbago with sciatica, right side: Secondary | ICD-10-CM

## 2015-02-27 DIAGNOSIS — M25659 Stiffness of unspecified hip, not elsewhere classified: Secondary | ICD-10-CM

## 2015-02-27 NOTE — Patient Instructions (Signed)
   Lifting Principles  Maintain proper posture and head alignment. Slide object as close as possible before lifting. Move obstacles out of the way. Test before lifting; ask for help if too heavy. Tighten stomach muscles without holding breath. Use smooth movements; do not jerk. Use legs to do the work, and pivot with feet. Distribute the work load symmetrically and close to the center of trunk. Push instead of pull whenever possible.   Squat down and hold basket close to stand. Use leg muscles to do the work.    Avoid twisting or bending back. Pivot around using foot movements, and bend at knees if needed when reaching for articles.        Getting Into / Out of Bed   Lower self to lie down on one side by raising legs and lowering head at the same time. Use arms to assist moving without twisting. Bend both knees to roll onto back if desired. To sit up, start from lying on side, and use same move-ments in reverse. Keep trunk aligned with legs.    Shift weight from front foot to back foot as item is lifted off shelf.    When leaning forward to pick object up from floor, extend one leg out behind. Keep back straight. Hold onto a sturdy support with other hand.      Sit upright, head facing forward. Try using a roll to support lower back. Keep shoulders relaxed, and avoid rounded back. Keep hips level with knees. Avoid crossing legs for long periods.     [facility] [address], [address] [place], [place] [address] Phone # [phone] Fax [phone]  

## 2015-02-27 NOTE — Therapy (Signed)
[facility] [address], [place], [place], [address] Phone: [phone]   Fax:  [phone]  Physical Therapy Treatment  Patient Details  Name: Krista Austin MRN: [id number] Date of Birth: July 24, 1945 Referring Provider: Kary Kos, MD  Encounter Date: 02/27/2015      PT End of Session - 02/27/15 1054    Visit Number 6   PT Start Time 4128   PT Stop Time 1055   PT Time Calculation (min) 39 min   Activity Tolerance Patient tolerated treatment well   Behavior During Therapy [facility] for tasks assessed/performed      Past Medical History  Diagnosis Date  . Cataract   . Hyperlipidemia     Past Surgical History  Procedure Laterality Date  . Spine surgery  2007    lumbar  . Spine surgery  2015    cervical fusion    There were no vitals filed for this visit.  Visit Diagnosis:  Bilateral low back pain with right-sided sciatica  Hip stiffness, unspecified laterality      Subjective Assessment - 02/27/15 1018    Subjective Ready for D/C now.  Will to HEP and walking at home.    Currently in Pain? Yes   Pain Score 1    Pain Location Back   Pain Orientation Right;Medial;Lower   Pain Descriptors / Indicators Sharp   Pain Type Chronic pain   Pain Onset More than a month ago   Pain Frequency Constant   Aggravating Factors  activity, housework, walking   Pain Relieving Factors sitting down, heat, Advil   Multiple Pain Sites Yes   Pain Score 1   Pain Location Neck   Pain Orientation Right;Left;Mid   Pain Descriptors / Indicators Aching   Pain Type Chronic pain   Pain Onset More than a month ago   Pain Frequency Intermittent   Aggravating Factors  unknonwn   Pain Relieving Factors heat, rest, avoiding activity            [facility] PT Assessment - 02/27/15 0001    Assessment   Medical Diagnosis 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  Pt has an adult child with Down's Syndrome that she cares for.   Observation/Other Assessments   Focus on Therapeutic Outcomes (FOTO)  58% lumbar/neck 60% limitation                     [facility] Adult PT Treatment/Exercise - 02/27/15 0001    Modalities   Modalities Ultrasound   Ultrasound   Ultrasound Location Rt SI joint and lumbar paraspinals   Ultrasound Parameters 1.3 w/cm2 cont x 8 minutes   Ultrasound Goals Pain                PT Education - 02/27/15 1026    Education provided Yes   Education Details body mechanics education   Person(s) Educated Patient   Methods Explanation;Demonstration;Handout   Comprehension Verbalized understanding;Returned demonstration          PT Short Term Goals - 01/22/15 1235    PT SHORT TERM GOAL #1   Title be independent in initial HEP   Status Achieved   PT SHORT TERM GOAL #2  Title report a 30% reduction in LBP/LE pain with standing to perform cooking and housework   Time 4   Period Weeks   Status Achieved   PT SHORT TERM GOAL #3   Title resume regular walking program for exercise without increase in pain   Status Achieved  short bouts           PT Long Term Goals - 03/28/15 1026    PT LONG TERM GOAL #1   Title be independent in advanced HEP   Status Achieved   PT LONG TERM GOAL #2   Title reduce FOTO to < or = to 45% limitation for lumbar spine   Status Partially Met  58% limitation   PT LONG TERM GOAL #3   Title report a 60% reduction in LBP/LE pain with housework and cooking tasks   Status Achieved   PT LONG TERM GOAL #4   Title walk in the community x30-45 minutes for shopping/errands with < or = to 2/10 LBP   Status Achieved   PT LONG TERM GOAL #5   Title report a 50% reduction in the frequency and  intensity of headaches    Status Achieved   PT LONG TERM GOAL #6   Title reduce FOTO to < or 47% limitation (neck)   Status Partially Met  60% limiation               Plan - Mar 28, 2015 1028    Clinical Impression Statement Pt would like to D/C to HEP as she is doing well with exercises and is feeling much better.  Pt reports >75% reducition in headache frequency and intensity.  reports 50% reduction in LBP.  Pt will discharge to HEP, body mechanics modifications and use of TENs for pain management.     PT Next Visit Plan D/C PT to HEP   Consulted and Agree with Plan of Care Patient          G-Codes - 03/28/2015 1031    Functional Assessment Tool Used FOTO: 58% and clinical judgement   Functional Limitation Other PT primary   Other PT Primary Goal Status (H0388) At least 40 percent but less than 60 percent impaired, limited or restricted   Other PT Primary Discharge Status (E2800) At least 40 percent but less than 60 percent impaired, limited or restricted      Problem List There are no active problems to display for this patient.  PHYSICAL THERAPY DISCHARGE SUMMARY  Visits from Start of Care: 6  Current functional level related to goals / functional outcomes: See above for current status.  Pt had HEP in place and is using activity modification and HEP to manage symptoms.   Remaining deficits: Pt with chronic neck and lumbar pain.  Pt is managing with exercise, activity modifications and TENs.   Education / Equipment: HEP, Economist Plan: Patient agrees to discharge.  Patient goals were partially met. Patient is being discharged due to being pleased with the current functional level.  ?????     Ezra Denne, PT 03/28/15, 10:55 AM  [facility] [address] [place], [place], [address] Phone: [phone]   Fax:  [phone]  Name: Krista Austin MRN: [id number] Date of Birth:  16-May-1945

## 2015-03-01 ENCOUNTER — Emergency Department (HOSPITAL_COMMUNITY)
Admission: EM | Admit: 2015-03-01 | Discharge: 2015-03-01 | Disposition: A | Discharge status: Home / Self Care | Payer: Medicare Other | Attending: Emergency Medicine | Admitting: Emergency Medicine

## 2015-03-01 ENCOUNTER — Emergency Department (HOSPITAL_COMMUNITY): Payer: Medicare Other

## 2015-03-01 DIAGNOSIS — H269 Unspecified cataract: Secondary | ICD-10-CM | POA: Insufficient documentation

## 2015-03-01 DIAGNOSIS — Z720 Tobacco use: Secondary | ICD-10-CM | POA: Insufficient documentation

## 2015-03-01 DIAGNOSIS — Y92009 Unspecified place in unspecified non-institutional (private) residence as the place of occurrence of the external cause: Secondary | ICD-10-CM | POA: Diagnosis not present

## 2015-03-01 DIAGNOSIS — S0003XA Contusion of scalp, initial encounter: Secondary | ICD-10-CM | POA: Diagnosis not present

## 2015-03-01 DIAGNOSIS — S0990XA Unspecified injury of head, initial encounter: Secondary | ICD-10-CM | POA: Diagnosis present

## 2015-03-01 DIAGNOSIS — Y9389 Activity, other specified: Secondary | ICD-10-CM | POA: Insufficient documentation

## 2015-03-01 DIAGNOSIS — E785 Hyperlipidemia, unspecified: Secondary | ICD-10-CM | POA: Diagnosis not present

## 2015-03-01 DIAGNOSIS — W01198A Fall on same level from slipping, tripping and stumbling with subsequent striking against other object, initial encounter: Secondary | ICD-10-CM | POA: Insufficient documentation

## 2015-03-01 DIAGNOSIS — S199XXA Unspecified injury of neck, initial encounter: Secondary | ICD-10-CM | POA: Diagnosis not present

## 2015-03-01 DIAGNOSIS — Z79899 Other long term (current) drug therapy: Secondary | ICD-10-CM | POA: Insufficient documentation

## 2015-03-01 DIAGNOSIS — Z7982 Long term (current) use of aspirin: Secondary | ICD-10-CM | POA: Diagnosis not present

## 2015-03-01 DIAGNOSIS — Z91040 Latex allergy status: Secondary | ICD-10-CM | POA: Insufficient documentation

## 2015-03-01 DIAGNOSIS — Y998 Other external cause status: Secondary | ICD-10-CM | POA: Diagnosis not present

## 2015-03-01 DIAGNOSIS — E119 Type 2 diabetes mellitus without complications: Secondary | ICD-10-CM | POA: Insufficient documentation

## 2015-03-01 DIAGNOSIS — T148XXA Other injury of unspecified body region, initial encounter: Secondary | ICD-10-CM

## 2015-03-01 DIAGNOSIS — M542 Cervicalgia: Secondary | ICD-10-CM

## 2015-03-01 NOTE — ED Notes (Signed)
Patient had a mechanical fall at home. Observed lump on back of her head post fall. No loss of consciousness, No pre-fall cardiac or neurologic symptoms. Woke this morning with dizziness and mild pain to back of head.

## 2015-03-01 NOTE — Discharge Instructions (Signed)

## 2015-03-01 NOTE — ED Notes (Signed)
Pt hit the back of her head yesterday after having a fall. Slipped on plastic placemat. Did not lose consciousness. Not on blood thinners. Pt initially had a bump on the back of her head and a HA which resolved last night. This am pt has been having dizziness that would not improve. Pt has hx of positional vertigo, but normally is intermittent and stops after offending activity. Also having a HA and neck pain. 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

## 2015-03-01 NOTE — ED Provider Notes (Signed)
CSN: [id number]     Arrival date & time 03/01/15  1024 History   First MD Initiated Contact with Patient 03/01/15 1125     Chief Complaint  Patient presents with  . Fall  . Head Injury   Patient is a 69 y.o. female presenting with fall and head injury.  Fall  Head Injury  Krista Austin is an 69 y.o. female with h/o C-spine and L-spine fusion and arthropathy, vertigo, DM, and HLD who presents to the ED for evaluation after a fall. She states she was in her normal state of health until yesterday evening when she slipped on a plastic placemat and hit the drown of her head on the corner of a wooden cabinet. Denies LOC. States that initially she had a mild occipital headache which initially resolved on its own but returned this morning. She states that she now feels lightheaded and "swimming" but states this does not feel like her baseline vertigo. She also states that she has chronic neck and back pain and just finished physical therapy but now has neck soreness increased from baseline. She states that her vision also feels a little bit more blurry than usual. Denies tinnitus or hearing loss. Denies numbness, tingling, paresthesias, weakness, facial droop. She was able to ambulate after her fall immediately and is able to ambulate today without assistance. Denies chest pain, SOB, fever, abdominal pain. She is not on anticoagulation.    Past Medical History  Diagnosis Date  . Cataract   . Hyperlipidemia    Past Surgical History  Procedure Laterality Date  . Spine surgery  2007    lumbar  . Spine surgery  2015    cervical fusion   No family history on file. Social History  Substance Use Topics  . Smoking status: Current Some Day Smoker  . Smokeless tobacco: Not on file  . Alcohol Use: Not on file   OB History    No data available     Review of Systems  All other systems reviewed and are negative.     Allergies  Lactose intolerance (gi) and Latex  Home Medications   Prior to  Admission medications   Medication Sig Start Date End Date Taking? Authorizing Provider  aspirin EC 81 MG tablet Take 81 mg by mouth at bedtime.    Yes Historical Provider, MD  atorvastatin (LIPITOR) 20 MG tablet Take 20 mg by mouth at bedtime.   Yes Historical Provider, MD  Calcium-Phosphorus-Vitamin D (CITRACAL +D3 PO) Take 1 tablet by mouth at bedtime.   Yes Historical Provider, MD  FLUoxetine (PROZAC) 40 MG capsule Take 40 mg by mouth at bedtime.    Yes Historical Provider, MD  hydrocortisone cream 1 % Apply 1 application topically daily as needed for itching.   Yes Historical Provider, MD  ibuprofen (ADVIL,MOTRIN) 200 MG tablet Take 400-800 mg by mouth 2 (two) times daily as needed for moderate pain.   Yes Historical Provider, MD  loratadine (CLARITIN) 10 MG tablet Take 10 mg by mouth at bedtime.   Yes Historical Provider, MD  Multiple Vitamins-Minerals (MULTIVITAMIN WITH MINERALS) tablet Take 1 tablet by mouth at bedtime.    Yes Historical Provider, MD  omega-3 acid ethyl esters (LOVAZA) 1 G capsule Take 540 mg by mouth at bedtime.    Yes Historical Provider, MD  omeprazole (PRILOSEC) 40 MG capsule Take 40 mg by mouth at bedtime.    Yes Historical Provider, MD  UNABLE TO FIND CPAP   Yes Historical Provider, MD  BP 148/74 mmHg  Pulse 86  Temp(Src) 97.7 F (36.5 C) (Oral)  Resp 16  SpO2 96% Physical Exam  Constitutional: She is oriented to person, place, and time. No distress.  HENT:  Head:    Right Ear: External ear normal.  Left Ear: External ear normal.  Nose: Nose normal.  Mouth/Throat: Oropharynx is clear and moist. No oropharyngeal exudate.  Eyes: Conjunctivae and EOM are normal. Pupils are equal, round, and reactive to light.  Neck: Normal range of motion. Neck supple.  Cardiovascular: Normal rate, regular rhythm, normal heart sounds and intact distal pulses.   No murmur heard. Pulmonary/Chest: Effort normal and breath sounds normal. No respiratory distress. She has no  wheezes. She exhibits no tenderness.  Abdominal: Soft. Bowel sounds are normal. She exhibits no distension. There is no tenderness. There is no rebound and no guarding.  Musculoskeletal: Normal range of motion. She exhibits no edema.  R cervical paraspinal muscles tender. FROM.   Lymphadenopathy:    She has no cervical adenopathy.  Neurological: She is alert and oriented to person, place, and time. No cranial nerve deficit.  5/5 strength upper and lower extremities bilaterally. Good grip strength. Sensation intact. Steady gait. No pronator drift.   Skin: Skin is warm and dry.  Psychiatric: She has a normal mood and affect.  Nursing note and vitals reviewed.   ED Course  Procedures (including critical care time) Labs Review Labs Reviewed - No data to display  Imaging Review Ct Head Wo Contrast  03/01/2015  CLINICAL DATA:  Struck back of head yesterday after a fall, slipped on plastic placemat, no loss of consciousness, bump on back of head, headache, dizziness, neck pain EXAM: CT HEAD WITHOUT CONTRAST CT CERVICAL SPINE WITHOUT CONTRAST TECHNIQUE: Multidetector CT imaging of the head and cervical spine was performed following the standard protocol without intravenous contrast. Multiplanar CT image reconstructions of the cervical spine were also generated. COMPARISON:  None. FINDINGS: CT HEAD FINDINGS Normal ventricular morphology. No midline shift or mass effect. Normal appearance of brain parenchyma. No intracranial hemorrhage, mass lesion or evidence acute infarction. No extra-axial fluid collections. Visualized paranasal sinuses and mastoid air cells clear. Osseous structures appear intact. CT CERVICAL SPINE FINDINGS 2.5 x 1.9 cm diameter RIGHT thyroid nodule with a partially calcified rim. Prevertebral soft tissues normal thickness. Visualized skullbase intact. Prior anterior fusion C5-C6. Minimal disc space narrowing and endplate spur formation C4-C5. Vertebral body and disc space heights  otherwise maintained. No acute fracture, subluxation or bone destruction. Minimal scattered facet degenerative changes slightly greater on RIGHT. Lung apices clear. IMPRESSION: No acute intracranial abnormalities. Prior C5-C6 anterior fusion. No acute cervical spine abnormalities. 2.5 x 1.9 cm diameter RIGHT thyroid nodule with partially calcified rim. Consider further evaluation with thyroid ultrasound. If patient is clinically hyperthyroid, consider nuclear medicine thyroid uptake and scan. Electronically Signed   By: Lavonia Dana M.D.   On: 03/01/2015 12:59   Ct Cervical Spine Wo Contrast  03/01/2015  CLINICAL DATA:  Struck back of head yesterday after a fall, slipped on plastic placemat, no loss of consciousness, bump on back of head, headache, dizziness, neck pain EXAM: CT HEAD WITHOUT CONTRAST CT CERVICAL SPINE WITHOUT CONTRAST TECHNIQUE: Multidetector CT imaging of the head and cervical spine was performed following the standard protocol without intravenous contrast. Multiplanar CT image reconstructions of the cervical spine were also generated. COMPARISON:  None. FINDINGS: CT HEAD FINDINGS Normal ventricular morphology. No midline shift or mass effect. Normal appearance of brain parenchyma. No intracranial  hemorrhage, mass lesion or evidence acute infarction. No extra-axial fluid collections. Visualized paranasal sinuses and mastoid air cells clear. Osseous structures appear intact. CT CERVICAL SPINE FINDINGS 2.5 x 1.9 cm diameter RIGHT thyroid nodule with a partially calcified rim. Prevertebral soft tissues normal thickness. Visualized skullbase intact. Prior anterior fusion C5-C6. Minimal disc space narrowing and endplate spur formation C4-C5. Vertebral body and disc space heights otherwise maintained. No acute fracture, subluxation or bone destruction. Minimal scattered facet degenerative changes slightly greater on RIGHT. Lung apices clear. IMPRESSION: No acute intracranial abnormalities. Prior C5-C6  anterior fusion. No acute cervical spine abnormalities. 2.5 x 1.9 cm diameter RIGHT thyroid nodule with partially calcified rim. Consider further evaluation with thyroid ultrasound. If patient is clinically hyperthyroid, consider nuclear medicine thyroid uptake and scan. Electronically Signed   By: Lavonia Dana M.D.   On: 03/01/2015 12:59   I have personally reviewed and evaluated these images and lab results as part of my medical decision-making.   EKG Interpretation None      MDM   Final diagnoses:  Contusion  Neck pain    Given pt's age, lightheadedness, and possible visual changes, will get CT head. Although she does not meet candian c-spine CT criteria, I will CT her c-spine as well given her age and concern about possibly having damaged something from prior surgeries. She denies pain at this time and does not request any pain medication. I do not see any indication for bloodwork at this time. Anticipate dispo home pending CT scans.    CT shows no acute abnormalities. D/c home with return precautions.    Anne Ng, PA-C 03/01/15 [place], MD 03/06/15 [id number]

## 2015-08-09 ENCOUNTER — Emergency Department (HOSPITAL_COMMUNITY): Payer: Medicare Other

## 2015-08-09 ENCOUNTER — Emergency Department (HOSPITAL_COMMUNITY)
Admission: EM | Admit: 2015-08-09 | Discharge: 2015-08-09 | Disposition: A | Discharge status: Home / Self Care | Payer: Medicare Other | Attending: Emergency Medicine | Admitting: Emergency Medicine

## 2015-08-09 ENCOUNTER — Encounter (HOSPITAL_COMMUNITY): Payer: Self-pay | Admitting: Emergency Medicine

## 2015-08-09 DIAGNOSIS — Z8669 Personal history of other diseases of the nervous system and sense organs: Secondary | ICD-10-CM | POA: Diagnosis not present

## 2015-08-09 DIAGNOSIS — S0990XA Unspecified injury of head, initial encounter: Secondary | ICD-10-CM | POA: Diagnosis present

## 2015-08-09 DIAGNOSIS — W01198A Fall on same level from slipping, tripping and stumbling with subsequent striking against other object, initial encounter: Secondary | ICD-10-CM | POA: Insufficient documentation

## 2015-08-09 DIAGNOSIS — Z91040 Latex allergy status: Secondary | ICD-10-CM | POA: Insufficient documentation

## 2015-08-09 DIAGNOSIS — S0512XA Contusion of eyeball and orbital tissues, left eye, initial encounter: Secondary | ICD-10-CM | POA: Insufficient documentation

## 2015-08-09 DIAGNOSIS — Z79899 Other long term (current) drug therapy: Secondary | ICD-10-CM | POA: Diagnosis not present

## 2015-08-09 DIAGNOSIS — F17200 Nicotine dependence, unspecified, uncomplicated: Secondary | ICD-10-CM | POA: Insufficient documentation

## 2015-08-09 DIAGNOSIS — W19XXXA Unspecified fall, initial encounter: Secondary | ICD-10-CM

## 2015-08-09 DIAGNOSIS — Z7982 Long term (current) use of aspirin: Secondary | ICD-10-CM | POA: Insufficient documentation

## 2015-08-09 DIAGNOSIS — E785 Hyperlipidemia, unspecified: Secondary | ICD-10-CM | POA: Diagnosis not present

## 2015-08-09 DIAGNOSIS — S0081XA Abrasion of other part of head, initial encounter: Secondary | ICD-10-CM

## 2015-08-09 DIAGNOSIS — Y9301 Activity, walking, marching and hiking: Secondary | ICD-10-CM | POA: Insufficient documentation

## 2015-08-09 DIAGNOSIS — Y998 Other external cause status: Secondary | ICD-10-CM | POA: Insufficient documentation

## 2015-08-09 DIAGNOSIS — S6991XA Unspecified injury of right wrist, hand and finger(s), initial encounter: Secondary | ICD-10-CM | POA: Insufficient documentation

## 2015-08-09 DIAGNOSIS — Y92093 Driveway of other non-institutional residence as the place of occurrence of the external cause: Secondary | ICD-10-CM | POA: Insufficient documentation

## 2015-08-09 DIAGNOSIS — M25531 Pain in right wrist: Secondary | ICD-10-CM

## 2015-08-09 MED ORDER — ACETAMINOPHEN 325 MG PO TABS
650.0000 mg | ORAL_TABLET | Freq: Once | ORAL | Status: AC
  Administered 2015-08-09: 650 mg via ORAL
  Filled 2015-08-09: qty 2

## 2015-08-09 MED ORDER — IBUPROFEN 200 MG PO TABS
600.0000 mg | ORAL_TABLET | Freq: Once | ORAL | Status: AC
  Administered 2015-08-09: 600 mg via ORAL
  Filled 2015-08-09: qty 3

## 2015-08-09 NOTE — ED Notes (Signed)
Patient transported to CT 

## 2015-08-09 NOTE — ED Notes (Signed)
Pt with Hx of vertigo tripped and fell today at 1400. Periorbital ecchymosis, edema and point tenderness to right wrist. Dizziness onset after fall. No LOC. No anticoagulants. Only antiplatelet is baby aspirin. No vision changes.

## 2015-08-09 NOTE — Discharge Instructions (Signed)
1. Medications: ibuprofen or tylenol for pain, usual home medications 2. Treatment: rest, drink plenty of fluids, ice and elevate right wrist and wear splint for comfort 3. Follow Up: please followup with your primary doctor for discussion of your diagnoses and further evaluation after today's visit; please return to the ER for severe headache, vomiting, weakness, new or worsening symptoms   Head Injury, Adult You have a head injury. Headaches and throwing up (vomiting) are common after a head injury. It should be easy to wake up from sleeping. Sometimes you must stay in the hospital. Most problems happen within the first 24 hours. Side effects may occur up to 7-10 days after the injury.  WHAT ARE THE TYPES OF HEAD INJURIES? Head injuries can be as minor as a bump. Some head injuries can be more severe. More severe head injuries include:  A jarring injury to the brain (concussion).  A bruise of the brain (contusion). This mean there is bleeding in the brain that can cause swelling.  A cracked skull (skull fracture).  Bleeding in the brain that collects, clots, and forms a bump (hematoma). WHEN SHOULD I GET HELP RIGHT AWAY?   You are confused or sleepy.  You cannot be woken up.  You feel sick to your stomach (nauseous) or keep throwing up (vomiting).  Your dizziness or unsteadiness is getting worse.  You have very bad, lasting headaches that are not helped by medicine. Take medicines only as told by your doctor.  You cannot use your arms or legs like normal.  You cannot walk.  You notice changes in the black spots in the center of the colored part of your eye (pupil).  You have clear or bloody fluid coming from your nose or ears.  You have trouble seeing. During the next 24 hours after the injury, you must stay with someone who can watch you. This person should get help right away (call 911 in the [place] if you start to shake and are not able to control it (have seizures), you  pass out, or you are unable to wake up. HOW CAN I PREVENT A HEAD INJURY IN THE FUTURE?  Wear seat belts.  Wear a helmet while bike riding and playing sports like football.  Stay away from dangerous activities around the house. WHEN CAN I RETURN TO NORMAL ACTIVITIES AND ATHLETICS? See your doctor before doing these activities. You should not do normal activities or play contact sports until 1 week after the following symptoms have stopped:  Headache that does not go away.  Dizziness.  Poor attention.  Confusion.  Memory problems.  Sickness to your stomach or throwing up.  Tiredness.  Fussiness.  Bothered by bright lights or loud noises.  Anxiousness or depression.  Restless sleep. MAKE SURE YOU:   Understand these instructions.  Will watch your condition.  Will get help right away if you are not doing well or get worse.   This information is not intended to replace advice given to you by your health care provider. Make sure you discuss any questions you have with your health care provider.   Document Released: 04/02/2008 Document Revised: 05/11/2014 Document Reviewed: 12/26/2012 Elsevier Interactive Patient Education 2016 [employer]  Wrist Pain There are many things that can cause wrist pain. Some common causes include:  An injury to the wrist area.  Overuse of the joint.  A condition that causes too much pressure to be put on a nerve in the wrist (carpal tunnel syndrome).  Wear  and tear of the joints that happens as a person gets older (osteoarthritis).  Other types of arthritis. Sometimes, the cause is not known. The pain often goes away when you follow instructions from your doctor about relieving pain at home. If your wrist pain does not go away, tests may need to be done to find the cause. HOME CARE Pay attention to any changes in your symptoms. Take these actions to help with your pain:  Rest your wrist for at least 48 hours or as told by your  doctor.  If your doctor tells you to, put ice on the injured area:  Put ice in a plastic bag.  Place a towel between your skin and the bag.  Leave the ice on for 20 minutes, 2-3 times per day.  Keep your arm raised (elevated) above the level of your heart while you are sitting or lying down.  If a splint or elastic bandage has been put on the injured area:  Wear it as told by your doctor.  Take the splint or bandage off only as told by your doctor.  Loosen the splint or bandage if your fingers lose feeling (are numb) or have a tingling feeling, or if they turn cold or blue.  Take over-the-counter and prescription medicines only as told by your doctor.  Keep all follow-up visits as told by your doctor. This is important. GET HELP IF:  Your pain is not helped by treatment.  Your pain gets worse. GET HELP RIGHT AWAY IF:   Your fingers swell.  Your fingers turn white, very red, or cold and blue.  Your fingers lose feeling or have a tingling feeling.  You have trouble moving your fingers.   This information is not intended to replace advice given to you by your health care provider. Make sure you discuss any questions you have with your health care provider.   Document Released: 10/07/2007 Document Revised: 01/09/2015 Document Reviewed: 09/05/2014 Elsevier Interactive Patient Education [employer].

## 2015-08-09 NOTE — ED Notes (Signed)
PT DISCHARGED. INSTRUCTIONS GIVEN. AAOX3. PT IN NO APPARENT DISTRESS. THE OPPORTUNITY TO ASK QUESTIONS WAS PROVIDED. 

## 2015-08-09 NOTE — ED Provider Notes (Signed)
CSN: [id number]     Arrival date & time 08/09/15  1727 History   First MD Initiated Contact with Patient 08/09/15 1812     Chief Complaint  Patient presents with  . Fall  . Arm Injury  . Head Injury    HPI   Krista Austin is a 70 y.o. female with a PMH of HLD and vertigo who presents to the ED s/p fall. She states she was walking down her driveway and fell around 2 PM this afternoon. She states she thinks she braced herself with her right wrist and hand and hit her face and head. She denies LOC. She notes pain to her right wrist and hand, pain to her left forehead, and dizziness. She denies vision changes, neck pain, numbness, weakness, paresthesia. She denies chest pain, shortness of breath, abdominal pain, nausea, vomiting, additional injury.  Past Medical History  Diagnosis Date  . Cataract   . Hyperlipidemia    Past Surgical History  Procedure Laterality Date  . Spine surgery  2007    lumbar  . Spine surgery  2015    cervical fusion   History reviewed. No pertinent family history. Social History  Substance Use Topics  . Smoking status: Current Some Day Smoker  . Smokeless tobacco: None  . Alcohol Use: None   OB History    No data available      Review of Systems  Constitutional: Negative for fever and chills.  Eyes: Negative for visual disturbance.  Respiratory: Negative for shortness of breath.   Cardiovascular: Negative for chest pain.  Gastrointestinal: Negative for nausea, vomiting and abdominal pain.  Musculoskeletal: Positive for arthralgias.  Neurological: Positive for dizziness and headaches. Negative for syncope, weakness, light-headedness and numbness.  All other systems reviewed and are negative.     Allergies  Lactose intolerance (gi) and Latex  Home Medications   Prior to Admission medications   Medication Sig Start Date End Date Taking? Authorizing Provider  aspirin EC 81 MG tablet Take 81 mg by mouth at bedtime.    Yes Historical Provider,  MD  atorvastatin (LIPITOR) 20 MG tablet Take 20 mg by mouth at bedtime.   Yes Historical Provider, MD  Calcium-Phosphorus-Vitamin D (CITRACAL +D3 PO) Take 1 tablet by mouth at bedtime.   Yes Historical Provider, MD  cetirizine (ZYRTEC) 10 MG tablet Take 10 mg by mouth at bedtime.   Yes Historical Provider, MD  FLUoxetine (PROZAC) 40 MG capsule Take 40 mg by mouth at bedtime.    Yes Historical Provider, MD  ibuprofen (ADVIL,MOTRIN) 200 MG tablet Take 400-800 mg by mouth 2 (two) times daily as needed for moderate pain.   Yes Historical Provider, MD  lisinopril (PRINIVIL,ZESTRIL) 10 MG tablet Take 10 mg by mouth at bedtime.  07/17/15  Yes Historical Provider, MD  Multiple Vitamins-Minerals (MULTIVITAMIN WITH MINERALS) tablet Take 1 tablet by mouth at bedtime.    Yes Historical Provider, MD  omeprazole (PRILOSEC) 40 MG capsule Take 40 mg by mouth at bedtime.    Yes Historical Provider, MD  rosuvastatin (CRESTOR) 10 MG tablet Take 10 mg by mouth at bedtime. 07/22/15  Yes Historical Provider, MD  hydrocortisone cream 1 % Apply 1 application topically daily as needed for itching.    Historical Provider, MD    BP 118/76 mmHg  Pulse 70  Temp(Src) 98.2 F (36.8 C) (Oral)  Resp 17  SpO2 98% Physical Exam  Constitutional: She is oriented to person, place, and time. She appears well-developed and well-nourished.  No distress.  HENT:  Head: Normocephalic. Head is with abrasion.    Right Ear: Hearing, tympanic membrane, external ear and ear canal normal. No hemotympanum.  Left Ear: Hearing, tympanic membrane, external ear and ear canal normal. No hemotympanum.  Nose: Nose normal.  Mouth/Throat: Uvula is midline, oropharynx is clear and moist and mucous membranes are normal.  Superficial abrasion to left forehead with associated TTP. Periorbital ecchymosis and edema to left eye.  Eyes: Conjunctivae, EOM and lids are normal. Pupils are equal, round, and reactive to light. Right eye exhibits no discharge.  Left eye exhibits no discharge. No scleral icterus.  Neck: Normal range of motion. Neck supple.  Cardiovascular: Normal rate, regular rhythm, normal heart sounds, intact distal pulses and normal pulses.   Pulmonary/Chest: Effort normal and breath sounds normal. No respiratory distress. She has no wheezes. She has no rales. She exhibits no tenderness.  Abdominal: Soft. Normal appearance and bowel sounds are normal. She exhibits no distension and no mass. There is no tenderness. There is no rigidity, no rebound and no guarding.  Musculoskeletal: Normal range of motion. She exhibits tenderness. She exhibits no edema.  TTP to right wrist and hand with decreased ROM due to pain. No significant edema, erythema, or heat. Otherwise, no TTP to upper extremities, chest wall, spine, pelvis, lower extremities.  Neurological: She is alert and oriented to person, place, and time. She has normal strength. No cranial nerve deficit or sensory deficit.  Skin: Skin is warm, dry and intact. No rash noted. She is not diaphoretic. No erythema. No pallor.  Psychiatric: She has a normal mood and affect. Her speech is normal and behavior is normal.  Nursing note and vitals reviewed.   ED Course  Procedures (including critical care time)  Labs Review Labs Reviewed - No data to display  Imaging Review Dg Wrist Complete Right  08/09/2015  CLINICAL DATA:  Fall this evening on outstretched hand. Diffuse right wrist pain. EXAM: RIGHT WRIST - COMPLETE 3+ VIEW COMPARISON:  None. FINDINGS: No acute fracture or dislocation. 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 Multifocal cystic change throughout the carpal bones without osseous erosion. No focal soft tissue abnormality. IMPRESSION: No acute fracture or dislocation of the right wrist. Osteoarthritis. Electronically Signed   By: Jeb Levering M.D.   On: 08/09/2015 18:31   Ct Head Wo Contrast  08/09/2015  EXAM: CT HEAD WITHOUT CONTRAST CT  MAXILLOFACIAL WITHOUT CONTRAST TECHNIQUE: Multidetector CT imaging of the head and maxillofacial structures were performed using the standard protocol without intravenous contrast. Multiplanar CT image reconstructions of the maxillofacial structures were also generated. COMPARISON:  03/01/2015 FINDINGS: CT HEAD FINDINGS The ventricles are normal in size and configuration. There are no parenchymal masses or mass effect, no evidence of infarct, no extra-axial masses or abnormal fluid collections and no intracranial hemorrhage. No skull fracture. Mastoid air cells and middle ear cavities are clear. CT MAXILLOFACIAL FINDINGS There is preseptal left periorbital soft tissue swelling that extends from the inferior left forehead. There is no postseptal left orbital edema or hemorrhage. There changes consistent with bilateral cataract surgery. Globes are otherwise unremarkable. Normal right orbit. No other soft tissue swelling.  No soft tissue masses or adenopathy. No fractures.  Sinuses are clear. IMPRESSION: HEAD CT:  No intracranial abnormality.  No skull fracture. MAXILLOFACIAL CT: No fractures. Left preseptal periorbital soft tissue swelling. Electronically Signed   By: Lajean Manes M.D.   On: 08/09/2015 19:21   Dg Hand Complete Right  08/09/2015  CLINICAL DATA:  Injury to right hand with pain. EXAM: RIGHT HAND - COMPLETE 3+ VIEW COMPARISON:  None. FINDINGS: No acute fracture or dislocation identified. Mild osteoarthritis is seen involving the interphalangeal joints and the first [place] joint. No bony lesions or erosions. Soft tissues are unremarkable. IMPRESSION: No acute fracture identified.  Mild osteoarthritis of the hand. Electronically Signed   By: Aletta Edouard M.D.   On: 08/09/2015 19:34   Ct Maxillofacial Wo Cm  08/09/2015  EXAM: CT HEAD WITHOUT CONTRAST CT MAXILLOFACIAL WITHOUT CONTRAST TECHNIQUE: Multidetector CT imaging of the head and maxillofacial structures were performed using the standard protocol  without intravenous contrast. Multiplanar CT image reconstructions of the maxillofacial structures were also generated. COMPARISON:  03/01/2015 FINDINGS: CT HEAD FINDINGS The ventricles are normal in size and configuration. There are no parenchymal masses or mass effect, no evidence of infarct, no extra-axial masses or abnormal fluid collections and no intracranial hemorrhage. No skull fracture. Mastoid air cells and middle ear cavities are clear. CT MAXILLOFACIAL FINDINGS There is preseptal left periorbital soft tissue swelling that extends from the inferior left forehead. There is no postseptal left orbital edema or hemorrhage. There changes consistent with bilateral cataract surgery. Globes are otherwise unremarkable. Normal right orbit. No other soft tissue swelling.  No soft tissue masses or adenopathy. No fractures.  Sinuses are clear. IMPRESSION: HEAD CT:  No intracranial abnormality.  No skull fracture. MAXILLOFACIAL CT: No fractures. Left preseptal periorbital soft tissue swelling. Electronically Signed   By: Lajean Manes M.D.   On: 08/09/2015 19:21   I have personally reviewed and evaluated these images and lab results as part of my medical decision-making.   EKG Interpretation None      MDM   Final diagnoses:  Fall  Right wrist pain  Forehead abrasion, initial encounter    70 year old female presents with right wrist pain and head injury s/p fall today prior to arrival. Notes she hit her head, though did not lose consciousness. Reports associated dizziness. Denies vision changes, neck pain, numbness, weakness, paresthesia. Denies chest pain, shortness of breath, abdominal pain, nausea, vomiting, additional injury.  Patient is afebrile. Vital signs stable. GCS 15. Normal neuro exam with no focal deficit. Superficial abrasion to left forehead with associated TTP. Periorbital ecchymosis and edema to left eye. TTP to right wrist and hand with decreased ROM due to pain. No significant  edema, erythema, or heat. Otherwise, no TTP to upper extremities, chest wall, spine, pelvis, lower extremities. Patient is NVI.  Will obtain imaging of right hand and right wrist as well as CT head and CT maxillofacial. Will give tylenol (patient declines vicodin or percocet).  Imaging of right hand negative for acute fracture.  Imaging of right wrist negative for acute fracture or dislocation. CT head negative for intracranial abnormality or skull fracture. CT maxillofacial negative for fracture, remarkable for left preseptal periorbital soft tissue swelling.  Discussed findings with patient and family members. Patient is non-toxic and well-appearing, feel she is stable for discharge at this time. Will give wrist splint for comfort and advised to rest, ice, and elevate and to take tylenol or ibuprofen for pain. Patient to follow-up with PCP. Strict return precautions discussed. Patient verbalizes her understanding and is in agreement with plan.  BP 118/76 mmHg  Pulse 70  Temp(Src) 98.2 F (36.8 C) (Oral)  Resp 17  SpO2 98%     Marella Chimes, PA-C 08/09/15 2013  Veryl Speak, MD 08/09/15 2146

## 2015-09-18 ENCOUNTER — Encounter: Payer: Self-pay | Admitting: Neurology

## 2015-09-18 ENCOUNTER — Ambulatory Visit (INDEPENDENT_AMBULATORY_CARE_PROVIDER_SITE_OTHER): Payer: Medicare Other | Admitting: Neurology

## 2015-09-18 VITALS — BP 106/74 | HR 64 | Ht 65.0 in | Wt 167.0 lb

## 2015-09-18 DIAGNOSIS — R42 Dizziness and giddiness: Secondary | ICD-10-CM

## 2015-09-18 DIAGNOSIS — R296 Repeated falls: Secondary | ICD-10-CM | POA: Diagnosis not present

## 2015-09-18 NOTE — Progress Notes (Signed)
Chart forwarded.  

## 2015-09-18 NOTE — Progress Notes (Addendum)
NEUROLOGY CONSULTATION NOTE  Krista Austin MRN: [id number] DOB: [birth year]  Referring provider: [name], PA Primary care provider: [name], PA  Reason for consult:  Vertigo, falls  HISTORY OF PRESENT ILLNESS: Krista Austin is a 70 year old woman with hyperlipidemia and history of vertigo who presents for dizziness and falls.  History obtained by patient, PCP note and ED note.  Imaging of head and maxillofacial CT personally reviewed.  She has had vertigo for 3 years, however it has progressively gotten worse in regards to longer duration and more frequent.  It is triggered with any quick movement such as turning to walk, looking down or bending down.  She describes a spinning sensation lasting 15 seconds or so.  More recently, she has had some nausea.  She notes occasional ringing in the ear.  She denies headache, facial numbness or double vision.  He has had increased falls, usually related to the dizziness.  However, she has had falls in which she simply tripped or one time she slipped on a mat.  Her last fall was on 08/09/15.  She went to the ED.  CT of head and maxillofacial were negative for fracture or acute intracranial process, although she did demonstrate left preseptal periorbital soft tissue swelling.    Meclizine is helpful.  She tried the Epley maneuver for a couple of months, which helped but the dizziness returned quickly when she discontinued it.  She has been worked up by ENT, including for Meniere's, which has been negative.  She does have cervical disc disease with history of surgery, as demonstrated on recent MRI of cervical spine from September.  No cord compression noted.  Carotid doppler from 08/26/15 showed no hemodynamically significant stenosis.  PAST MEDICAL HISTORY: Past Medical History  Diagnosis Date  . Cataract   . Hyperlipidemia   . Diabetes mellitus without complication ([place])   . DDD (degenerative disc disease), cervical   . Sleep apnea    . Thyroid nodule   . GERD (gastroesophageal reflux disease)   . Adenomatous polyp   . Celiac disease     genetically positive    PAST SURGICAL HISTORY: Past Surgical History  Procedure Laterality Date  . Spine surgery  2007    lumbar  . Spine surgery  2015    cervical fusion  . Tubal ligation  02/09/1979  . Tonsillectomy  05/05/1983  . Cataract extraction  09/10/2014    and 09/24/14 by Darleen Crocker    MEDICATIONS: Current Outpatient Prescriptions on File Prior to Visit  Medication Sig Dispense Refill  . aspirin EC 81 MG tablet Take 81 mg by mouth at bedtime.     Marland Kitchen atorvastatin (LIPITOR) 20 MG tablet Take 20 mg by mouth at bedtime.    . Calcium-Phosphorus-Vitamin D (CITRACAL +D3 PO) Take 1 tablet by mouth at bedtime.    . cetirizine (ZYRTEC) 10 MG tablet Take 10 mg by mouth at bedtime.    Marland Kitchen FLUoxetine (PROZAC) 40 MG capsule Take 40 mg by mouth at bedtime.     . hydrocortisone cream 1 % Apply 1 application topically daily as needed for itching.    Marland Kitchen ibuprofen (ADVIL,MOTRIN) 200 MG tablet Take 400-800 mg by mouth 2 (two) times daily as needed for moderate pain.    Marland Kitchen lisinopril (PRINIVIL,ZESTRIL) 10 MG tablet Take 10 mg by mouth at bedtime.   5  . Multiple Vitamins-Minerals (MULTIVITAMIN WITH MINERALS) tablet Take 1 tablet by mouth at bedtime.     Marland Kitchen omeprazole (  PRILOSEC) 40 MG capsule Take 40 mg by mouth at bedtime.     . rosuvastatin (CRESTOR) 10 MG tablet Take 10 mg by mouth at bedtime. Reported on 09/18/2015  5   No current facility-administered medications on file prior to visit.    ALLERGIES: Allergies  Allergen Reactions  . Lactose Intolerance (Gi) Diarrhea  . Latex Rash    FAMILY HISTORY: Family History  Problem Relation Age of Onset  . Mitral valve prolapse Mother   . Hyperlipidemia Mother   . Macular degeneration Mother   . Hypothyroidism Mother   . Arthritis Mother   . Atrial fibrillation Father   . COPD Father   . Osteoarthritis Sister   . Hypothyroidism  Sister   . Hyperlipidemia Sister   . Dementia Maternal Grandmother   . Breast cancer Maternal Grandmother   . Hypertension Maternal Grandmother   . Arthritis Maternal Grandmother   . Diabetes type I Maternal Grandfather   . Arthritis Sister   . Hypothyroidism Sister   . Fibromyalgia Sister   . Other Other     Osgood-schlatter    SOCIAL HISTORY: Social History   Social History  . Marital Status: Single    Spouse Name: N/A  . Number of Children: N/A  . Years of Education: N/A   Occupational History  . Not on file.   Social History Main Topics  . Smoking status: Current Some Day Smoker  . Smokeless tobacco: Not on file  . Alcohol Use: Not on file  . Drug Use: Not on file  . Sexual Activity: Not on file   Other Topics Concern  . Not on file   Social History Narrative    REVIEW OF SYSTEMS: Constitutional: No fevers, chills, or sweats, no generalized fatigue, change in appetite Eyes: No visual changes, double vision, eye pain Ear, nose and throat: No hearing loss, ear pain, nasal congestion, sore throat Cardiovascular: No chest pain, palpitations Respiratory:  No shortness of breath at rest or with exertion, wheezes GastrointestinaI: No nausea, vomiting, diarrhea, abdominal pain, fecal incontinence Genitourinary:  No dysuria, urinary retention or frequency Musculoskeletal:  No neck pain, back pain Integumentary: No rash, pruritus, skin lesions Neurological: as above Psychiatric: No depression, insomnia, anxiety Endocrine: No palpitations, fatigue, diaphoresis, mood swings, change in appetite, change in weight, increased thirst Hematologic/Lymphatic:  No purpura, petechiae. Allergic/Immunologic: no itchy/runny eyes, nasal congestion, recent allergic reactions, rashes  PHYSICAL EXAM: BP 106/74, HR 64, Wt 167 lb, Ht 5'5" General: No acute distress.  Patient appears well-groomed.  Head:  Normocephalic/atraumatic Eyes:  fundi examined but not visualized Neck: supple,  no paraspinal tenderness, full range of motion Back: No paraspinal tenderness Heart: regular rate and rhythm Lungs: Clear to auscultation bilaterally. Vascular: No carotid bruits. Neurological Exam: Mental status: alert and oriented to person, place, and time, recent and remote memory intact, fund of knowledge intact, attention and concentration intact, speech fluent and not dysarthric, language intact. Cranial nerves: CN I: not tested CN II: pupils equal, round and reactive to light, visual fields intact CN III, IV, VI:  full range of motion, no nystagmus, no ptosis CN V: facial sensation intact CN VII: upper and lower face symmetric CN VIII: hearing intact CN IX, X: gag intact, uvula midline CN XI: sternocleidomastoid and trapezius muscles intact CN XII: tongue midline Bulk & Tone: normal, no fasciculations. Motor:  5/5 throughout  Sensation: temperature and vibration sensation intact. Deep Tendon Reflexes:  2+ throughout, toes downgoing.  Finger to nose testing:  Without dysmetria.  Heel to shin:  Without dysmetria.  Gait:  Cautious but overall normal station and stride.  Able to turn but difficulty with tandem walk. Romberg with sway. Dix-Hallpike negative, although she developed slight vertigo with head turned to right.  IMPRESSION: Positional vertigo.  Clinically, it sounds like BPPV.  However, further investigation is warranted because it is fairly constant and doesn't resolve anymore.  Also, she has had several falls.  PLAN: MRI of brain and internal auditory canals with and without contrast If unremarkable, recommend referral to vestibular rehab  Thank you for allowing me to take part in the care of this patient.  Metta Clines, DO  CC:  [name], PA

## 2015-09-18 NOTE — Patient Instructions (Signed)
We will get an MRI of brain and internal auditory canals with and without contrast to look for something in the brain and ears to cause the dizziness.  If unrevealing, we will refer you to vestibular rehabilitation.

## 2015-09-27 ENCOUNTER — Ambulatory Visit
Admission: RE | Admit: 2015-09-27 | Discharge: 2015-09-27 | Disposition: A | Discharge status: Home / Self Care | Payer: Medicare Other | Source: Ambulatory Visit | Attending: Neurology | Admitting: Neurology

## 2015-09-27 DIAGNOSIS — R42 Dizziness and giddiness: Secondary | ICD-10-CM

## 2015-09-27 DIAGNOSIS — R296 Repeated falls: Secondary | ICD-10-CM

## 2015-09-27 MED ORDER — GADOBENATE DIMEGLUMINE 529 MG/ML IV SOLN
15.0000 mL | Freq: Once | INTRAVENOUS | Status: AC | PRN
  Administered 2015-09-27: 15 mL via INTRAVENOUS

## 2015-10-02 ENCOUNTER — Telehealth: Payer: Self-pay

## 2015-10-02 DIAGNOSIS — H8110 Benign paroxysmal vertigo, unspecified ear: Secondary | ICD-10-CM

## 2015-10-02 NOTE — Telephone Encounter (Signed)
-----   Message from Pieter Partridge, DO sent at 10/02/2015  7:00 AM EDT ----- As MRI of brain and internal auditory canals is normal, recommend vestibular rehabilitation.

## 2015-10-02 NOTE — Telephone Encounter (Signed)
-----   Message from Alda Berthold, DO sent at 09/27/2015  1:54 PM EDT ----- Please inform patient that MRI brain does not reveal any reason for her vertigo, everything looks stable. Dr. Tomi Likens can review upon his return on Wednesday and decide the next step.

## 2015-10-02 NOTE — Telephone Encounter (Signed)
Message relayed to pt. Referral for vestibular rehab submitted.

## 2015-10-03 ENCOUNTER — Encounter: Payer: Self-pay | Admitting: Physical Therapy

## 2015-10-03 ENCOUNTER — Ambulatory Visit: Payer: Medicare Other | Attending: Neurology | Admitting: Physical Therapy

## 2015-10-03 DIAGNOSIS — M542 Cervicalgia: Secondary | ICD-10-CM | POA: Diagnosis present

## 2015-10-03 DIAGNOSIS — R29898 Other symptoms and signs involving the musculoskeletal system: Secondary | ICD-10-CM | POA: Diagnosis present

## 2015-10-03 DIAGNOSIS — R42 Dizziness and giddiness: Secondary | ICD-10-CM | POA: Diagnosis not present

## 2015-10-03 DIAGNOSIS — R2681 Unsteadiness on feet: Secondary | ICD-10-CM | POA: Diagnosis present

## 2015-10-03 NOTE — Patient Instructions (Addendum)
Strengthening: Lateral Flexion - Resisted, Beginning to End Range    Facing forward, fingertips on right temple, head side bent away, tilt head back toward other shoulder. Give light resistance. Hold __30__ seconds. Repeat _2-3_ times per set. Do __1__ sets per session. Do _2-3_ sessions per day.  http://orth.exer.us/335   Copyright  VHI. All rights reserved.  Gaze Stabilization: Tip Card  1.Target must remain in focus, not blurry, and appear stationary while head is in motion. 2.Perform exercises with small head movements (45 to either side of midline). 3.Increase speed of head motion so long as target is in focus. 4.If you wear eyeglasses, be sure you can see target through lens (therapist will give specific instructions for bifocal / progressive lenses). 5.These exercises may provoke dizziness or nausea. Work through these symptoms. If too dizzy, slow head movement slightly. Rest between each exercise. 6.Exercises demand concentration; avoid distractions. 7.For safety, perform standing exercises close to a counter, wall, corner, or next to someone.  Copyright  VHI. All rights reserved.  Gaze Stabilization: Standing Feet Apart (Compliant Surface)   Feet apart on pillow, keeping eyes on target on wall __6_ feet away, tilt head down 15-30 and move head side to side for _60___ seconds. Repeat while moving head up and down for __60__ seconds. Do __3-5__ sessions per day. Repeat using target on pattern background.  Copyright  VHI. All rights reserved.

## 2015-10-06 NOTE — Therapy (Signed)
[facility] [address] [place], [place], [address] Phone: [phone]   Fax:  [phone]  Physical Therapy Evaluation  Patient Details  Name: Krista Austin MRN: [id number] Date of Birth: 1946-02-05 Referring Provider: Dr. Metta Clines  Encounter Date: 10/03/2015      PT End of Session - 10/06/15 0933    Visit Number 1  G1   Number of Visits 9   Date for PT Re-Evaluation 12/02/15   Authorization Type Medicare   Authorization Time Period 10-03-15 - 12-02-15   Authorization - Visit Number 1   PT Start Time 1101   PT Stop Time 1153   PT Time Calculation (min) 52 min      Past Medical History  Diagnosis Date  . Cataract   . Hyperlipidemia   . Diabetes mellitus without complication ([place])   . DDD (degenerative disc disease), cervical   . Sleep apnea   . Thyroid nodule   . GERD (gastroesophageal reflux disease)   . Adenomatous polyp   . Celiac disease     genetically positive    Past Surgical History  Procedure Laterality Date  . Spine surgery  2007    lumbar  . Spine surgery  2015    cervical fusion  . Tubal ligation  02/09/1979  . Tonsillectomy  05/05/1983  . Cataract extraction  09/10/2014    and 09/24/14 by Darleen Crocker    There were no vitals filed for this visit.           [facility] PT Assessment - 10/06/15 0001    Assessment   Medical Diagnosis Vertigo:  BPPV   Referring Provider Dr. Metta Clines   Onset Date/Surgical Date --  approx. 1 year ago   Balance Screen   Has the patient fallen in the past 6 months Yes   How many times? 5   Has the patient had a decrease in activity level because of a fear of falling?  Yes   Is the patient reluctant to leave their home because of a fear of falling?  No   Home Environment   Living Environment Private residence   Living Arrangements Other relatives   Type of [place] Two level   Palpation   Palpation comment Pt has significant  tightnessin bil. upper traps            Vestibular Assessment - 10/06/15 0001    Vestibular Assessment   General Observation Pt is a 69 year old lady with c/o dizziness which she says is mostly constant but is worse with quick turns and with bending over/returing to upright position  pt rates intensity 4-5/10   Symptom Behavior   Type of Dizziness Unsteady with head/body turns   Frequency of Dizziness varies but is mostly constant   Duration of Dizziness constant but severity increases with certain movements   Aggravating Factors Turning body quickly;Forward bending   Relieving Factors Lying supine   Occulomotor Exam   Occulomotor Alignment Normal   Smooth Pursuits Intact   Saccades Intact   Visual Acuity   Static line 8   Dynamic line 5                       PT Education - 10/06/15 0932    Education provided Yes   Education Details cervical stretches/ROM:  Mulligans stretch for rotation:  VOR in standing   Person(s) Educated Patient   Methods Explanation;Demonstration;Handout  Comprehension Verbalized understanding;Returned demonstration             PT Long Term Goals - 10/06/15 0946    PT LONG TERM GOAL #1   Title Complete SOT and establish LTG as appropriate.  11-02-15   Time 4   Period Weeks   Status New   PT LONG TERM GOAL #2   Title Report at least 50% improvement in cervical tightness/discomfort with AROM.  11-02-15   Time 4   Period Weeks   Status New   PT LONG TERM GOAL #3   Title Report at least 50% improvement in vertigo.  11-02-15   Time 4   Period Weeks   Status New   PT LONG TERM GOAL #4   Title Improve DGA to </= 2 line difference for improved VOR.  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  Symptoms are not completely consistent with BPPV and appears to be partly cervicogenic in etiology.  PMH includes chronic cervical and back DDD, DM type 2:, and GERD and sleep apnea with use of C-PAP machine.   Surgical hx includes lumbar  disc sx laminectomy 2007 and ACD with fusion and plating in March 2015.  Pt presents with tightness and trigger points in bil. upper trap regions with c/o discomfort at  end cervical ROM.     Rehab Potential Good   PT Frequency 1x / week   PT Duration 4 weeks   PT Treatment/Interventions ADLs/Self Care Home Management;Canalith Repostioning;Electrical Stimulation;Moist Heat;Ultrasound;Balance training;Therapeutic exercise;Therapeutic activities;Gait training;Neuromuscular re-education;Patient/family education;Passive range of motion;Vestibular   PT Next Visit Plan check HEP - begin US/e-stim for cervical region: Do SOT   PT Home Exercise Plan cervical stretches   Consulted and Agree with Plan of Care Patient      Patient will benefit from skilled therapeutic intervention in order to improve the following deficits and impairments:  Decreased balance, Decreased range of motion, Difficulty walking, Dizziness  Visit Diagnosis: Dizziness and giddiness - Plan: PT plan of care cert/re-cert  Other symptoms and signs involving the musculoskeletal system - Plan: PT plan of care cert/re-cert  Cervicalgia - Plan: PT plan of care cert/re-cert  Unsteadiness on feet - Plan: PT plan of care cert/re-cert     Problem List There are no active problems to display for this patient.   Krista Austin, PT 10/06/2015, 9:57 AM  [facility] [address] [place], [place], [address] Phone: [phone]   Fax:  [phone]  Name: Krista Austin MRN: [id number] Date of Birth: [birth year]

## 2015-10-24 ENCOUNTER — Ambulatory Visit: Payer: Medicare Other | Admitting: Physical Therapy

## 2015-10-24 DIAGNOSIS — R42 Dizziness and giddiness: Secondary | ICD-10-CM

## 2015-10-24 DIAGNOSIS — M542 Cervicalgia: Secondary | ICD-10-CM

## 2015-10-25 ENCOUNTER — Other Ambulatory Visit: Payer: Self-pay | Admitting: Physician Assistant

## 2015-10-25 DIAGNOSIS — Z1231 Encounter for screening mammogram for malignant neoplasm of breast: Secondary | ICD-10-CM

## 2015-10-27 NOTE — Therapy (Signed)
[facility] [address] [place], [place], [address] Phone: [phone]   Fax:  [phone]  Physical Therapy Treatment  Patient Details  Name: Krista Austin MRN: [id number] Date of Birth: [birth year] Referring Provider: Dr. Metta Clines  Encounter Date: 10/24/2015      PT End of Session - 10/27/15 2202    Visit Number 2   Number of Visits 9   Date for PT Re-Evaluation 12/02/15   Authorization Type Medicare   Authorization Time Period 10-03-15 - 12-02-15   Authorization - Visit Number 2   PT Start Time 1020   PT Stop Time 1105   PT Time Calculation (min) 45 min      Past Medical History  Diagnosis Date  . Cataract   . Hyperlipidemia   . Diabetes mellitus without complication ([place])   . DDD (degenerative disc disease), cervical   . Sleep apnea   . Thyroid nodule   . GERD (gastroesophageal reflux disease)   . Adenomatous polyp   . Celiac disease     genetically positive    Past Surgical History  Procedure Laterality Date  . Spine surgery  2007    lumbar  . Spine surgery  2015    cervical fusion  . Tubal ligation  02/09/1979  . Tonsillectomy  05/05/1983  . Cataract extraction  09/10/2014    and 09/24/14 by Darleen Crocker    There were no vitals filed for this visit.      Subjective Assessment - 10/27/15 2157    Subjective Pt states the vertigo is much better since she has been doing the neck stretches   Patient Stated Goals resolve the vertigo   Currently in Pain? No/denies                         [facility] Adult PT Treatment/Exercise - 10/27/15 0001    Modalities   Modalities Ultrasound   Ultrasound   Ultrasound Location R and L upper trap and posterior cervical regions   Ultrasound Parameters 1.5 w/cm2 100% continuous  8"; 5" on R side and 3" on L side   Ultrasound Goals Pain       NeuroRe-ed:  Sensory Organizaiton Test score 71/100 for composite score (N= 69/100)  No falls  occurred during testing Somatosensory input 78/100; N=90/100 Visual input 68/100 with N= 80/100 Vestibular input WNL's at 60/100 with N= 53/100  All trials Normal except on conditions 2 and 4 - with all 3 trials on each of these conditions below N but as stated Above no LOB/FALL occurred on any trial  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  11-02-15   Time 4   Period Weeks   Status New   PT LONG TERM GOAL #2   Title Report at least 50% improvement in cervical tightness/discomfort with AROM.  11-02-15   Time 4  Period Weeks   Status New   PT LONG TERM GOAL #3   Title Report at least 50% improvement in vertigo.  11-02-15   Time 4   Period Weeks   Status New   PT LONG TERM GOAL #4   Title Improve DGA to </= 2 line difference for improved VOR.  11-02-15   Time 4   Period Weeks   Status New   PT LONG TERM GOAL #5   Title Independent in HEP   11-02-15   Time 4   Period Weeks   Status New               Plan - 10/27/15 2203    Clinical Impression Statement Composite score of SOT WNL's with score 71/100:  vestibular input WNL's with decreased somatosensory and visual inputs:  stretches for cervical region appear to be decreasing the vertigo by decreasing cervical musc. tightness   Rehab Potential Good   PT Frequency 1x / week   PT Duration 4 weeks   PT Treatment/Interventions ADLs/Self Care Home Management;Canalith  Repostioning;Electrical Stimulation;Moist Heat;Ultrasound;Balance training;Therapeutic exercise;Therapeutic activities;Gait training;Neuromuscular re-education;Patient/family education;Passive range of motion;Vestibular   PT Next Visit Plan check HEP; [place] if benefit achieved   PT Home Exercise Plan cervical stretches   Consulted and Agree with Plan of Care Patient      Patient will benefit from skilled therapeutic intervention in order to improve the following deficits and impairments:  Decreased balance, Decreased range of motion, Difficulty walking, Dizziness  Visit Diagnosis: Dizziness and giddiness  Cervicalgia     Problem List There are no active problems to display for this patient.   [name], PT 10/27/2015, 10:08 PM  [facility] [address] [place], [place], [address] Phone: [phone]   Fax:  [phone]  Name: Krista Austin MRN: [id number] Date of Birth: 24-Jul-1945

## 2015-10-29 ENCOUNTER — Ambulatory Visit: Payer: Medicare Other | Admitting: Physical Therapy

## 2015-11-18 ENCOUNTER — Ambulatory Visit: Payer: Medicare Other | Admitting: Physical Therapy

## 2015-11-25 ENCOUNTER — Ambulatory Visit: Payer: Medicare Other

## 2016-01-01 ENCOUNTER — Ambulatory Visit: Payer: Medicare Other

## 2016-01-27 ENCOUNTER — Ambulatory Visit: Payer: Medicare Other

## 2016-06-08 ENCOUNTER — Ambulatory Visit
Admission: RE | Admit: 2016-06-08 | Discharge: 2016-06-08 | Disposition: A | Discharge status: Home / Self Care | Payer: Medicare Other | Source: Ambulatory Visit | Attending: Physician Assistant | Admitting: Physician Assistant

## 2016-06-08 DIAGNOSIS — Z1231 Encounter for screening mammogram for malignant neoplasm of breast: Secondary | ICD-10-CM

## 2016-08-05 DIAGNOSIS — Z981 Arthrodesis status: Secondary | ICD-10-CM | POA: Insufficient documentation

## 2016-09-22 ENCOUNTER — Other Ambulatory Visit: Payer: Self-pay | Admitting: Gastroenterology

## 2016-09-22 DIAGNOSIS — R1011 Right upper quadrant pain: Secondary | ICD-10-CM

## 2016-09-22 DIAGNOSIS — R110 Nausea: Secondary | ICD-10-CM

## 2016-10-02 ENCOUNTER — Ambulatory Visit (HOSPITAL_COMMUNITY)
Admission: RE | Admit: 2016-10-02 | Discharge: 2016-10-02 | Disposition: A | Discharge status: Home / Self Care | Payer: Medicare Other | Source: Ambulatory Visit | Attending: Gastroenterology | Admitting: Gastroenterology

## 2016-10-02 ENCOUNTER — Encounter (HOSPITAL_COMMUNITY)
Admission: RE | Admit: 2016-10-02 | Discharge: 2016-10-02 | Disposition: A | Discharge status: Home / Self Care | Payer: Medicare Other | Source: Ambulatory Visit | Attending: Gastroenterology | Admitting: Gastroenterology

## 2016-10-02 DIAGNOSIS — R110 Nausea: Secondary | ICD-10-CM

## 2016-10-02 DIAGNOSIS — R932 Abnormal findings on diagnostic imaging of liver and biliary tract: Secondary | ICD-10-CM | POA: Insufficient documentation

## 2016-10-02 DIAGNOSIS — R1011 Right upper quadrant pain: Secondary | ICD-10-CM

## 2016-10-02 MED ORDER — TECHNETIUM TC 99M MEBROFENIN IV KIT: 5.5000 | PACK | Freq: Once | INTRAVENOUS | Status: DC | PRN

## 2016-10-05 ENCOUNTER — Ambulatory Visit: Payer: Self-pay | Admitting: Surgery

## 2016-10-06 ENCOUNTER — Emergency Department (HOSPITAL_BASED_OUTPATIENT_CLINIC_OR_DEPARTMENT_OTHER): Payer: Medicare Other

## 2016-10-06 ENCOUNTER — Other Ambulatory Visit: Payer: Self-pay

## 2016-10-06 ENCOUNTER — Observation Stay (HOSPITAL_BASED_OUTPATIENT_CLINIC_OR_DEPARTMENT_OTHER)
Admission: EM | Admit: 2016-10-06 | Discharge: 2016-10-07 | Disposition: A | Discharge status: Home / Self Care | Payer: Medicare Other | Attending: Internal Medicine | Admitting: Internal Medicine

## 2016-10-06 ENCOUNTER — Encounter (HOSPITAL_BASED_OUTPATIENT_CLINIC_OR_DEPARTMENT_OTHER): Payer: Self-pay | Admitting: Emergency Medicine

## 2016-10-06 DIAGNOSIS — K828 Other specified diseases of gallbladder: Secondary | ICD-10-CM | POA: Diagnosis not present

## 2016-10-06 DIAGNOSIS — R0602 Shortness of breath: Secondary | ICD-10-CM | POA: Diagnosis not present

## 2016-10-06 DIAGNOSIS — R9389 Abnormal findings on diagnostic imaging of other specified body structures: Secondary | ICD-10-CM

## 2016-10-06 DIAGNOSIS — Z79899 Other long term (current) drug therapy: Secondary | ICD-10-CM | POA: Insufficient documentation

## 2016-10-06 DIAGNOSIS — R938 Abnormal findings on diagnostic imaging of other specified body structures: Secondary | ICD-10-CM | POA: Insufficient documentation

## 2016-10-06 DIAGNOSIS — R42 Dizziness and giddiness: Secondary | ICD-10-CM | POA: Diagnosis not present

## 2016-10-06 DIAGNOSIS — E119 Type 2 diabetes mellitus without complications: Secondary | ICD-10-CM | POA: Insufficient documentation

## 2016-10-06 DIAGNOSIS — R0789 Other chest pain: Secondary | ICD-10-CM | POA: Diagnosis not present

## 2016-10-06 DIAGNOSIS — I10 Essential (primary) hypertension: Secondary | ICD-10-CM | POA: Insufficient documentation

## 2016-10-06 DIAGNOSIS — E785 Hyperlipidemia, unspecified: Secondary | ICD-10-CM | POA: Diagnosis not present

## 2016-10-06 DIAGNOSIS — R079 Chest pain, unspecified: Secondary | ICD-10-CM

## 2016-10-06 DIAGNOSIS — F17210 Nicotine dependence, cigarettes, uncomplicated: Secondary | ICD-10-CM | POA: Diagnosis not present

## 2016-10-06 HISTORY — DX: Family history of other specified conditions: Z84.89

## 2016-10-06 HISTORY — DX: Other complications of anesthesia, initial encounter: T88.59XA

## 2016-10-06 HISTORY — DX: Obstructive sleep apnea (adult) (pediatric): Z99.89

## 2016-10-06 HISTORY — DX: Other chronic pain: G89.29

## 2016-10-06 HISTORY — DX: Depression, unspecified: F32.A

## 2016-10-06 HISTORY — DX: Adverse effect of unspecified anesthetic, initial encounter: T41.45XA

## 2016-10-06 HISTORY — DX: Obstructive sleep apnea (adult) (pediatric): G47.33

## 2016-10-06 HISTORY — DX: Unspecified osteoarthritis, unspecified site: M19.90

## 2016-10-06 HISTORY — DX: Major depressive disorder, single episode, unspecified: F32.9

## 2016-10-06 HISTORY — DX: Type 2 diabetes mellitus without complications: E11.9

## 2016-10-06 HISTORY — DX: Pneumonia, unspecified organism: J18.9

## 2016-10-06 HISTORY — DX: Dorsalgia, unspecified: M54.9

## 2016-10-06 LAB — CBC
HCT: 44.7 % (ref 36.0–46.0)
HCT: 45.9 % (ref 36.0–46.0)
Hemoglobin: 15.6 g/dL — ABNORMAL HIGH (ref 12.0–15.0)
Hemoglobin: 15.6 g/dL — ABNORMAL HIGH (ref 12.0–15.0)
MCH: 31.2 pg (ref 26.0–34.0)
MCH: 31.1 pg (ref 26.0–34.0)
MCHC: 34.9 g/dL (ref 30.0–36.0)
MCHC: 34 g/dL (ref 30.0–36.0)
MCV: 89.4 fL (ref 78.0–100.0)
MCV: 91.4 fL (ref 78.0–100.0)
Platelets: 208 10*3/uL (ref 150–400)
Platelets: 199 10*3/uL (ref 150–400)
RBC: 5 MIL/uL (ref 3.87–5.11)
RBC: 5.02 MIL/uL (ref 3.87–5.11)
RDW: 14.3 % (ref 11.5–15.5)
RDW: 14.2 % (ref 11.5–15.5)
WBC: 7.7 10*3/uL (ref 4.0–10.5)
WBC: 7.9 10*3/uL (ref 4.0–10.5)

## 2016-10-06 LAB — CBG MONITORING, ED: Glucose-Capillary: 155 mg/dL — ABNORMAL HIGH (ref 65–99)

## 2016-10-06 LAB — COMPREHENSIVE METABOLIC PANEL
ALT: 22 U/L (ref 14–54)
AST: 28 U/L (ref 15–41)
Albumin: 4.2 g/dL (ref 3.5–5.0)
Alkaline Phosphatase: 85 U/L (ref 38–126)
Anion gap: 8 (ref 5–15)
BUN: 21 mg/dL — ABNORMAL HIGH (ref 6–20)
CO2: 24 mmol/L (ref 22–32)
Calcium: 9.4 mg/dL (ref 8.9–10.3)
Chloride: 107 mmol/L (ref 101–111)
Creatinine, Ser: 0.98 mg/dL (ref 0.44–1.00)
GFR calc Af Amer: >60 mL/min (ref >60)
GFR calc non Af Amer: 58 mL/min — ABNORMAL LOW (ref >60)
Glucose, Bld: 137 mg/dL — ABNORMAL HIGH (ref 65–99)
Potassium: 3.6 mmol/L (ref 3.5–5.1)
Sodium: 139 mmol/L (ref 135–145)
Total Bilirubin: 0.4 mg/dL (ref 0.3–1.2)
Total Protein: 7.3 g/dL (ref 6.5–8.1)

## 2016-10-06 LAB — TROPONIN I
Troponin I: <0.03 ng/mL (ref <0.03)
Troponin I: <0.03 ng/mL (ref <0.03)
Troponin I: <0.03 ng/mL (ref <0.03)

## 2016-10-06 LAB — LIPASE, BLOOD: Lipase: 35 U/L (ref 11–51)

## 2016-10-06 LAB — CREATININE, SERUM
Creatinine, Ser: 0.93 mg/dL (ref 0.44–1.00)
GFR calc Af Amer: >60 mL/min (ref >60)
GFR calc non Af Amer: >60 mL/min (ref >60)

## 2016-10-06 MED ORDER — NITROGLYCERIN 0.4 MG SL SUBL: 0.4000 mg | SUBLINGUAL_TABLET | SUBLINGUAL | Status: DC | PRN

## 2016-10-06 MED ORDER — ROSUVASTATIN CALCIUM 10 MG PO TABS
10.0000 mg | ORAL_TABLET | Freq: Every day | ORAL | Status: DC
  Administered 2016-10-06: 10 mg via ORAL
  Filled 2016-10-06: qty 1

## 2016-10-06 MED ORDER — PANTOPRAZOLE SODIUM 40 MG PO TBEC
40.0000 mg | DELAYED_RELEASE_TABLET | Freq: Every day | ORAL | Status: DC
  Administered 2016-10-06 – 2016-10-07 (×2): 40 mg via ORAL
  Filled 2016-10-06 (×2): qty 1

## 2016-10-06 MED ORDER — ASPIRIN EC 81 MG PO TBEC
81.0000 mg | DELAYED_RELEASE_TABLET | Freq: Every day | ORAL | Status: DC
  Administered 2016-10-06: 81 mg via ORAL
  Filled 2016-10-06: qty 1

## 2016-10-06 MED ORDER — ASPIRIN 81 MG PO CHEW
324.0000 mg | CHEWABLE_TABLET | Freq: Once | ORAL | Status: AC
  Administered 2016-10-06: 324 mg via ORAL
  Filled 2016-10-06: qty 4

## 2016-10-06 MED ORDER — ACETAMINOPHEN 325 MG PO TABS: 650.0000 mg | ORAL_TABLET | ORAL | Status: DC | PRN

## 2016-10-06 MED ORDER — FAMOTIDINE 20 MG PO TABS: 10.0000 mg | ORAL_TABLET | Freq: Every day | ORAL | Status: DC

## 2016-10-06 MED ORDER — LISINOPRIL 10 MG PO TABS
10.0000 mg | ORAL_TABLET | Freq: Every day | ORAL | Status: DC
  Administered 2016-10-06: 10 mg via ORAL
  Filled 2016-10-06: qty 1

## 2016-10-06 MED ORDER — FLUOXETINE HCL 10 MG PO CAPS
40.0000 mg | ORAL_CAPSULE | Freq: Every day | ORAL | Status: DC
  Administered 2016-10-06: 40 mg via ORAL
  Filled 2016-10-06: qty 4

## 2016-10-06 MED ORDER — ENOXAPARIN SODIUM 40 MG/0.4ML ~~LOC~~ SOLN
40.0000 mg | SUBCUTANEOUS | Status: DC
  Administered 2016-10-06: 40 mg via SUBCUTANEOUS
  Filled 2016-10-06: qty 0.4

## 2016-10-06 MED ORDER — ONDANSETRON HCL 4 MG/2ML IJ SOLN: 4.0000 mg | Freq: Four times a day (QID) | INTRAMUSCULAR | Status: DC | PRN

## 2016-10-06 NOTE — Plan of Care (Signed)
Problem: Education: Goal: Knowledge of [facility] Education information/materials will improve Outcome: Progressing Patient was given and reviewed Welcome packet and gave info and reviewed regarding safety, how to call for RN/N, using the white board, POC and testing and new medications, patient verbalized understanding and all questions were answered.

## 2016-10-06 NOTE — ED Notes (Addendum)
Pt reports feeling some dizziness since 1030 this morning. States this is not unusual due to her diabetes and she felt like her sugar was possibly low. However at 1100 she had sudden onset of severe left arm pain. States this was different pain from any she has had before (she has Hx of degenerative disc pain). Pain is resolved at this time. Speech clear, facial symmetry present, grips strong and equal, pt cao x 4.

## 2016-10-06 NOTE — ED Notes (Signed)
EKG dept. Called and stated to get it a few minutes for the EKG to show up for MD to view. If not to return a call to the EKG dept. RN Joss informed.

## 2016-10-06 NOTE — Plan of Care (Signed)
Problem: Safety: Goal: Ability to remain free from injury will improve Outcome: Progressing Patient is aware to call for assistance and how to use the call light and her white phone, all personal items within her reach at her bedside.

## 2016-10-06 NOTE — ED Notes (Signed)
Patient transported to X-ray 

## 2016-10-06 NOTE — ED Provider Notes (Signed)
[facility]PT [facility] Provider Note   CSN: [id number] Arrival date & time: 10/06/16  1320     History   Chief Complaint Chief Complaint  Patient presents with  . Arm Pain    HPI Krista Austin is a 71 y.o. female.  The history is provided by the patient.  Extremity Pain  This is a new problem. The current episode started 1 to 2 hours ago. The problem occurs constantly. The problem has been resolved. Associated symptoms include chest pain and shortness of breath. Pertinent negatives include no abdominal pain and no headaches. Associated symptoms comments: Nausea, dizziness, and flushing. Nothing aggravates the symptoms. Relieved by: self resolved. She has tried nothing for the symptoms.   No prior MIs, stress testing, cardiac workup.  Denied any leg swelling/pain, recent travel/surgeries, history of DVT/PE, personal history of cancer, use of hormone replacement therapy.  Past Medical History:  Diagnosis Date  . Adenomatous polyp   . Cataract   . Celiac disease    genetically positive  . DDD (degenerative disc disease), cervical   . Diabetes mellitus without complication ([place])   . GERD (gastroesophageal reflux disease)   . Hyperlipidemia   . Sleep apnea   . Thyroid nodule     Patient Active Problem List   Diagnosis Date Noted  . Chest pain 10/06/2016    Past Surgical History:  Procedure Laterality Date  . CATARACT EXTRACTION  09/10/2014   and 09/24/14 by Darleen Crocker  . SPINE SURGERY  2007   lumbar  . SPINE SURGERY  2015   cervical fusion  . TONSILLECTOMY  05/05/1983  . TUBAL LIGATION  02/09/1979    OB History    No data available       Home Medications    Prior to Admission medications   Medication Sig Start Date End Date Taking? Authorizing Provider  ranitidine (ZANTAC) 150 MG capsule Take 150 mg by mouth 2 (two) times daily.   Yes [provider]  aspirin EC 81 MG tablet Take 81 mg by mouth at bedtime.     [provider]    atorvastatin (LIPITOR) 20 MG tablet Take 20 mg by mouth at bedtime.    [provider]  Calcium-Phosphorus-Vitamin D (CITRACAL +D3 PO) Take 1 tablet by mouth at bedtime.    [provider]  cetirizine (ZYRTEC) 10 MG tablet Take 10 mg by mouth at bedtime.    [provider]  FLUoxetine (PROZAC) 40 MG capsule Take 40 mg by mouth at bedtime.     [provider]  hydrocortisone cream 1 % Apply 1 application topically daily as needed for itching.    [provider]  ibuprofen (ADVIL,MOTRIN) 200 MG tablet Take 400-800 mg by mouth 2 (two) times daily as needed for moderate pain.    [provider]  lisinopril (PRINIVIL,ZESTRIL) 10 MG tablet Take 10 mg by mouth at bedtime.  07/17/15   [provider]  Multiple Vitamins-Minerals (MULTIVITAMIN WITH MINERALS) tablet Take 1 tablet by mouth at bedtime.     [provider]  omeprazole (PRILOSEC) 40 MG capsule Take 40 mg by mouth at bedtime.     [provider]  rosuvastatin (CRESTOR) 10 MG tablet Take 10 mg by mouth at bedtime. Reported on 09/18/2015 07/22/15   [provider]    Family History Family History  Problem Relation Age of Onset  . Mitral valve prolapse Mother   . Hyperlipidemia Mother   . Macular degeneration Mother   .  Hypothyroidism Mother   . Arthritis Mother   . Atrial fibrillation Father   . COPD Father   . Osteoarthritis Sister   . Hypothyroidism Sister   . Hyperlipidemia Sister   . Dementia Maternal Grandmother   . Breast cancer Maternal Grandmother   . Hypertension Maternal Grandmother   . Arthritis Maternal Grandmother   . Diabetes type I Maternal Grandfather   . Arthritis Sister   . Hypothyroidism Sister   . Fibromyalgia Sister   . Other Other        [place]-schlatter    Social History Social History  Substance Use Topics  . Smoking status: Current Some Day Smoker  . Smokeless tobacco: Never Used  . Alcohol use No      Allergies   Lactose intolerance (gi) and Latex   Review of Systems Review of Systems  Respiratory: Positive for shortness of breath.   Cardiovascular: Positive for chest pain.  Gastrointestinal: Negative for abdominal pain.  Neurological: Negative for headaches.  All other systems are reviewed and are negative for acute change except as noted in the HPI    Physical Exam Updated Vital Signs BP 132/82 (BP Location: Right Arm)   Pulse 86   Temp 98.6 F (37 C) (Oral)   Resp 18   Ht 5\' 5"  (1.651 m)   Wt 73.5 kg (162 lb)   SpO2 97%   BMI 26.96 kg/m   Physical Exam  Constitutional: She is oriented to person, place, and time. She appears well-developed and well-nourished. No distress.  HENT:  Head: Normocephalic and atraumatic.  Nose: Nose normal.  Eyes: Conjunctivae and EOM are normal. Pupils are equal, round, and reactive to light. Right eye exhibits no discharge. Left eye exhibits no discharge. No scleral icterus.  Neck: Normal range of motion. Neck supple.  Cardiovascular: Normal rate and regular rhythm.  Exam reveals no gallop and no friction rub.   No murmur heard. Pulmonary/Chest: Effort normal and breath sounds normal. No stridor. No respiratory distress. She has no rales.  Abdominal: Soft. She exhibits no distension. There is no tenderness.  Musculoskeletal: She exhibits no edema or tenderness.  Neurological: She is alert and oriented to person, place, and time.  Skin: Skin is warm and dry. No rash noted. She is not diaphoretic. No erythema.  Psychiatric: She has a normal mood and affect.  Vitals reviewed.    ED Treatments / Results  Labs (all labs ordered are listed, but only abnormal results are displayed) Labs Reviewed  CBC - Abnormal; Notable for the following:       Result Value   Hemoglobin 15.6 (*)    All other components within normal limits  COMPREHENSIVE METABOLIC PANEL - Abnormal; Notable for the following:    Glucose, Bld 137 (*)    BUN 21  (*)    GFR calc non Af Amer 58 (*)    All other components within normal limits  CBG MONITORING, ED - Abnormal; Notable for the following:    Glucose-Capillary 155 (*)    All other components within normal limits  LIPASE, BLOOD  TROPONIN I    EKG  EKG Interpretation  Date/Time:  Tuesday October 06 2016 15:02:41 EDT Ventricular Rate:  73 PR Interval:    QRS Duration: 95 QT Interval:  383 QTC Calculation: 422 R Axis:   70 Text Interpretation:  Sinus rhythm Low voltage, precordial leads NO STEMI Confirmed by Addison Lank [phone]) on 10/06/2016 3:18:38 PM       Radiology Dg Chest  2 View  Result Date: 10/06/2016 CLINICAL DATA:  Dizziness began approximately 90 minutes prior to admission associated with 10 out of 10 left upper arm pain. History of diabetes and hyperlipidemia. Current smoker. EXAM: CHEST  2 VIEW COMPARISON:  None in PACs FINDINGS: The lungs are well-expanded. The interstitial markings are coarse. Subtle increased densities noted lateral to the left heart border. The heart is normal in size. The pulmonary vascularity is not engorged. The mediastinum is normal in width. There is faint calcification in the wall of the aortic arch. There is mild multilevel degenerative disc disease of the thoracic spine. IMPRESSION: Mild interstitial prominence of both lungs likely reflecting the patient's smoking history. There is an area of confluent density adjacent to the left heart border laterally. This may reflect focal atelectasis or infiltrate or less likely a parenchymal nodule. This likely lies in the lower lobe. No evidence of CHF. PA and lateral chest X-ray is recommended in 3-4 weeks following trial of antibiotic therapy to ensure resolution and exclude underlying malignancy. Thoracic aortic atherosclerosis. Electronically Signed   By: David  Martinique M.D.   On: 10/06/2016 14:42    Procedures Procedures (including critical care time)  Medications Ordered in ED Medications   nitroGLYCERIN (NITROSTAT) SL tablet 0.4 mg (not administered)  aspirin chewable tablet 324 mg (324 mg Oral Given 10/06/16 1421)     Initial Impression / Assessment and Plan / ED Course  I have reviewed the triage vital signs and the nursing notes.  Pertinent labs & imaging results that were available during my care of the patient were reviewed by me and considered in my medical decision making (see chart for details).  Clinical Course as of Oct 06 1608  Tue Oct 06, 2016  1408 Atypical chest pain. EKG without evidence of acute ischemia or evidence of pericarditis. HEAR 4. If initial troponin is negative and other workup is unremarkable, patient will require admission for ACS rule out.  Low pretest probability for pulmonary embolism. Presentation not classic for aortic dissection or esophageal perforation.  [PC]  9242 Chest x-ray without evidence suggestive of pneumonia, pneumothorax, pneumomediastinum.  No abnormal contour of the mediastinum to suggest dissection. No evidence of acute injuries.  Initial troponin negative. Rest of the labs reassuring.   Discussed case with Dr. [place] Pink, who will admit the patient for ACS rule out.  [PC]    Clinical Course User Index [PC] Cardama, Grayce Sessions, MD      Final Clinical Impressions(s) / ED Diagnoses   Final diagnoses:  Chest pain     Cardama, Grayce Sessions, MD 10/06/16 1610

## 2016-10-06 NOTE — ED Triage Notes (Signed)
Patient states that she became actually dizzy about and hour and half ago and had 10/10 left arm pain. The patient reports that the pain has eased off but still slightly dizzy. No focal neuro deficits at this time.

## 2016-10-06 NOTE — H&P (Signed)
History and Physical    Krista Austin:[id number] DOB: 01-Jun-1945 DOA: 10/06/2016  PCP: Blair Heys, PA-C  Patient coming from: home, [facility]   Chief Complaint: left arm pain   HPI: Krista Austin is a 71 y.o. female with medical history significant of hyperlipidemia, hypertension, GERD, diet-controlled diabetes, tobacco abuse, OSA who presents with sudden onset of sharp and throbbing left arm pain, left shoulder pain that started at rest this morning. It lasted about 10 minutes and resolved spontaneously. She also admits to some pain surrounding her neck which she thought was due to her degenerative disc disease. Her arm pain returned, although was less intense than her initial pain. This also resolved spontaneously. She sought care at [facility] [place]. Currently, she is asymptomatic. She denies any anterior chest pain, back pain, fevers or chills. She does have chronic nausea as well as indigestion due to her biliary dyskinesia. She does have cholecystectomy planned for next Friday. No new abdominal pain, denies any dysuria or leg swelling.  ED Course: Concern for angina equivalent. Initial troponin was negative, initial EKG unremarkable. Patient transferred to [facility] for further evaluation.  Review of Systems: As per HPI otherwise 10 point review of systems negative.   Past Medical History:  Diagnosis Date  . Adenomatous polyp   . Cataract   . Celiac disease    genetically positive  . DDD (degenerative disc disease), cervical   . Diabetes mellitus without complication ([place])   . GERD (gastroesophageal reflux disease)   . Hyperlipidemia   . Sleep apnea   . Thyroid nodule     Past Surgical History:  Procedure Laterality Date  . CATARACT EXTRACTION  09/10/2014   and 09/24/14 by Darleen Crocker  . SPINE SURGERY  2007   lumbar  . SPINE SURGERY  2015   cervical fusion  . TONSILLECTOMY  05/05/1983  . TUBAL LIGATION  02/09/1979     reports that she has been smoking.  She has  never used smokeless tobacco. She reports that she does not drink alcohol or use drugs.  Allergies  Allergen Reactions  . Lactose Intolerance (Gi) Diarrhea  . Latex Rash    Family History  Problem Relation Age of Onset  . Mitral valve prolapse Mother   . Hyperlipidemia Mother   . Macular degeneration Mother   . Hypothyroidism Mother   . Arthritis Mother   . Atrial fibrillation Father   . COPD Father   . Osteoarthritis Sister   . Hypothyroidism Sister   . Hyperlipidemia Sister   . Dementia Maternal Grandmother   . Breast cancer Maternal Grandmother   . Hypertension Maternal Grandmother   . Arthritis Maternal Grandmother   . Diabetes type I Maternal Grandfather   . Arthritis Sister   . Hypothyroidism Sister   . Fibromyalgia Sister   . Other Other        Osgood-schlatter    Prior to Admission medications   Medication Sig Start Date End Date Taking? Authorizing Provider  ranitidine (ZANTAC) 150 MG capsule Take 150 mg by mouth 2 (two) times daily.   Yes [provider]  aspirin EC 81 MG tablet Take 81 mg by mouth at bedtime.     [provider]  atorvastatin (LIPITOR) 20 MG tablet Take 20 mg by mouth at bedtime.    [provider]  Calcium-Phosphorus-Vitamin D (CITRACAL +D3 PO) Take 1 tablet by mouth at bedtime.    [provider]  cetirizine (ZYRTEC) 10 MG tablet Take  10 mg by mouth at bedtime.    [provider]  FLUoxetine (PROZAC) 40 MG capsule Take 40 mg by mouth at bedtime.     [provider]  hydrocortisone cream 1 % Apply 1 application topically daily as needed for itching.    [provider]  ibuprofen (ADVIL,MOTRIN) 200 MG tablet Take 400-800 mg by mouth 2 (two) times daily as needed for moderate pain.    [provider]  lisinopril (PRINIVIL,ZESTRIL) 10 MG tablet Take 10 mg by mouth at bedtime.  07/17/15   [provider]  Multiple Vitamins-Minerals (MULTIVITAMIN WITH MINERALS) tablet  Take 1 tablet by mouth at bedtime.     [provider]  omeprazole (PRILOSEC) 40 MG capsule Take 40 mg by mouth at bedtime.     [provider]  rosuvastatin (CRESTOR) 10 MG tablet Take 10 mg by mouth at bedtime. Reported on 09/18/2015 07/22/15   [provider]    Physical Exam: Vitals:   10/06/16 1502 10/06/16 1625 10/06/16 1850 10/06/16 2006  BP: 126/74 129/80 139/65 120/75  Pulse: 75 74 79 69  Resp: 11 17 18 18   Temp:   97.7 F (36.5 C) 98.3 F (36.8 C)  TempSrc:   Axillary Oral  SpO2: 95% 93% 95% 97%  Weight:   73 kg (160 lb 14.4 oz)   Height:   5\' 5"  (1.651 m)     Constitutional: NAD, calm, comfortable Eyes: PERRL, lids and conjunctivae normal ENMT: Mucous membranes are moist. Normal dentition.  Neck: normal, supple, no masses, no thyromegaly Respiratory: clear to auscultation bilaterally, no wheezing, no crackles. Normal respiratory effort. No accessory muscle use.  Cardiovascular: Regular rate and rhythm, no murmurs / rubs / gallops. No extremity edema.  Abdomen: no tenderness, no masses palpated. No hepatosplenomegaly. Bowel sounds positive.  Musculoskeletal: no clubbing / cyanosis. No joint deformity upper and lower extremities. Good ROM Normal muscle tone.  Skin: no rashes, lesions, ulcers.  Neurologic: CN 2-12 grossly intact. Strength 5/5 in all 4.  Psychiatric: Normal judgment and insight. Alert and oriented x 3. Normal mood.   Labs on Admission: I have personally reviewed following labs and imaging studies  CBC:  Recent Labs Lab 10/06/16 1412 10/06/16 1941  WBC 7.7 7.9  HGB 15.6* 15.6*  HCT 44.7 45.9  MCV 89.4 91.4  PLT 208 169   Basic Metabolic Panel:  Recent Labs Lab 10/06/16 1412  NA 139  K 3.6  CL 107  CO2 24  GLUCOSE 137*  BUN 21*  CREATININE 0.98  CALCIUM 9.4   GFR: Estimated Creatinine Clearance: 54.2 mL/min (by C-G formula based on SCr of 0.98 mg/dL). Liver Function Tests:  Recent Labs Lab 10/06/16 1412    AST 28  ALT 22  ALKPHOS 85  BILITOT 0.4  PROT 7.3  ALBUMIN 4.2    Recent Labs Lab 10/06/16 1412  LIPASE 35   No results for input(s): AMMONIA in the last 168 hours. Coagulation Profile: No results for input(s): INR, PROTIME in the last 168 hours. Cardiac Enzymes:  Recent Labs Lab 10/06/16 1412  TROPONINI <0.03   BNP (last 3 results) No results for input(s): PROBNP in the last 8760 hours. HbA1C: No results for input(s): HGBA1C in the last 72 hours. CBG:  Recent Labs Lab 10/06/16 1350  GLUCAP 155*   Lipid Profile: No results for input(s): CHOL, HDL, LDLCALC, TRIG, CHOLHDL, LDLDIRECT in the last 72 hours. Thyroid Function Tests: No results for input(s): TSH, T4TOTAL, FREET4, T3FREE, THYROIDAB in  the last 72 hours. Anemia Panel: No results for input(s): VITAMINB12, FOLATE, FERRITIN, TIBC, IRON, RETICCTPCT in the last 72 hours. Urine analysis: No results found for: COLORURINE, APPEARANCEUR, LABSPEC, PHURINE, GLUCOSEU, HGBUR, BILIRUBINUR, KETONESUR, PROTEINUR, UROBILINOGEN, NITRITE, LEUKOCYTESUR Sepsis Labs: !!!!!!!!!!!!!!!!!!!!!!!!!!!!!!!!!!!!!!!!!!!! @LABRCNTIP (procalcitonin:4,lacticidven:4) )No results found for this or any previous visit (from the past 240 hour(s)).   Radiological Exams on Admission: Dg Chest 2 View  Result Date: 10/06/2016 CLINICAL DATA:  Dizziness began approximately 90 minutes prior to admission associated with 10 out of 10 left upper arm pain. History of diabetes and hyperlipidemia. Current smoker. EXAM: CHEST  2 VIEW COMPARISON:  None in PACs FINDINGS: The lungs are well-expanded. The interstitial markings are coarse. Subtle increased densities noted lateral to the left heart border. The heart is normal in size. The pulmonary vascularity is not engorged. The mediastinum is normal in width. There is faint calcification in the wall of the aortic arch. There is mild multilevel degenerative disc disease of the thoracic spine. IMPRESSION: Mild  interstitial prominence of both lungs likely reflecting the patient's smoking history. There is an area of confluent density adjacent to the left heart border laterally. This may reflect focal atelectasis or infiltrate or less likely a parenchymal nodule. This likely lies in the lower lobe. No evidence of CHF. PA and lateral chest X-ray is recommended in 3-4 weeks following trial of antibiotic therapy to ensure resolution and exclude underlying malignancy. Thoracic aortic atherosclerosis. Electronically Signed   By: David  Martinique M.D.   On: 10/06/2016 14:42    EKG: Independently reviewed. Normal sinus rhythm, no ST changes.   Assessment/Plan Active Problems:   Chest pain  Atypical chest pain  -Sudden sharp and throbbing left arm pain which resolved spontaneously, concerning for angina equivalent.  -Trend trop. Initial trop negative -Repeat EKG in AM. Initial EKG unremarkable.  -NPO after midnight  -Aspirin -Echo   HLD -Crestor  HTN -Lisinopril  GERD -PPI, zantac   DM type 2 -Diet controlled -SSI   Tobacco abuse -Cessation counseling   Biliary dyskinesia -Cholecystectomy planned for next Friday   Abnormal CXR -Asymptomatic from pulmonary standpoint. CXR with area of confluent density adjacent to the left heart border laterally. PA and lateral chest X-ray is recommended in 3-4 weeks    DVT prophylaxis: lovenox Code Status: full  Family Communication: no family at bedside Disposition Plan: pending work up [employer] called: none, may need cardiology consult in morning   Admission status: observation    Dessa Phi, DO [facility] [email or web address] Password [email or web address] 10/06/2016, 8:25 PM

## 2016-10-07 ENCOUNTER — Encounter (HOSPITAL_COMMUNITY): Payer: Self-pay | Admitting: General Practice

## 2016-10-07 ENCOUNTER — Observation Stay (HOSPITAL_BASED_OUTPATIENT_CLINIC_OR_DEPARTMENT_OTHER): Payer: Medicare Other

## 2016-10-07 ENCOUNTER — Observation Stay (HOSPITAL_COMMUNITY): Payer: Medicare Other

## 2016-10-07 DIAGNOSIS — R079 Chest pain, unspecified: Secondary | ICD-10-CM

## 2016-10-07 DIAGNOSIS — E785 Hyperlipidemia, unspecified: Secondary | ICD-10-CM | POA: Diagnosis not present

## 2016-10-07 DIAGNOSIS — R938 Abnormal findings on diagnostic imaging of other specified body structures: Secondary | ICD-10-CM | POA: Diagnosis not present

## 2016-10-07 DIAGNOSIS — R9389 Abnormal findings on diagnostic imaging of other specified body structures: Secondary | ICD-10-CM

## 2016-10-07 DIAGNOSIS — E119 Type 2 diabetes mellitus without complications: Secondary | ICD-10-CM | POA: Diagnosis not present

## 2016-10-07 DIAGNOSIS — R911 Solitary pulmonary nodule: Secondary | ICD-10-CM | POA: Insufficient documentation

## 2016-10-07 DIAGNOSIS — I5030 Unspecified diastolic (congestive) heart failure: Secondary | ICD-10-CM | POA: Diagnosis not present

## 2016-10-07 DIAGNOSIS — I200 Unstable angina: Secondary | ICD-10-CM

## 2016-10-07 DIAGNOSIS — R0789 Other chest pain: Secondary | ICD-10-CM | POA: Diagnosis not present

## 2016-10-07 DIAGNOSIS — R0602 Shortness of breath: Secondary | ICD-10-CM | POA: Diagnosis not present

## 2016-10-07 LAB — ECHOCARDIOGRAM COMPLETE
Height: 65 in
Weight: 2574.4 oz

## 2016-10-07 LAB — D-DIMER, QUANTITATIVE: D-Dimer, Quant: 0.57 ug/mL-FEU — ABNORMAL HIGH (ref 0.00–0.50)

## 2016-10-07 LAB — LIPID PANEL
Cholesterol: 111 mg/dL (ref 0–200)
HDL: 33 mg/dL — ABNORMAL LOW (ref >40)
LDL Cholesterol: 55 mg/dL (ref 0–99)
Total CHOL/HDL Ratio: 3.4 RATIO
Triglycerides: 113 mg/dL (ref <150)
VLDL: 23 mg/dL (ref 0–40)

## 2016-10-07 LAB — NM MYOCAR MULTI W/SPECT W/WALL MOTION / EF
Estimated workload: 1 METS
Exercise duration (min): 6 min
Exercise duration (sec): 2 s
Peak HR: 109 {beats}/min
Rest HR: 63 {beats}/min

## 2016-10-07 LAB — TROPONIN I: Troponin I: <0.03 ng/mL (ref <0.03)

## 2016-10-07 MED ORDER — TECHNETIUM TC 99M TETROFOSMIN IV KIT: 30.0000 | PACK | Freq: Once | INTRAVENOUS | Status: DC | PRN

## 2016-10-07 MED ORDER — LISINOPRIL 10 MG PO TABS: 10.0000 mg | ORAL_TABLET | Freq: Every day | ORAL | 1 refills | Status: DC

## 2016-10-07 MED ORDER — REGADENOSON 0.4 MG/5ML IV SOLN
INTRAVENOUS | Status: AC
  Filled 2016-10-07: qty 5

## 2016-10-07 MED ORDER — AMINOPHYLLINE 25 MG/ML IV SOLN
INTRAVENOUS | Status: AC
  Filled 2016-10-07: qty 10

## 2016-10-07 MED ORDER — REGADENOSON 0.4 MG/5ML IV SOLN
0.4000 mg | Freq: Once | INTRAVENOUS | Status: AC
  Administered 2016-10-07: 0.4 mg via INTRAVENOUS
  Filled 2016-10-07: qty 5

## 2016-10-07 MED ORDER — TECHNETIUM TC 99M TETROFOSMIN IV KIT
10.0000 | PACK | Freq: Once | INTRAVENOUS | Status: AC | PRN
  Administered 2016-10-07: 10 via INTRAVENOUS

## 2016-10-07 NOTE — Progress Notes (Signed)
Report received in patient's room via [facility] RN using SBAR format, reviewed orders, labs, VS, meds and patient's general condition, assumed care of patient.

## 2016-10-07 NOTE — Discharge Summary (Addendum)
Physician Discharge Summary  Krista Austin MRN: [id number] DOB/AGE: December 16, 1945 71 y.o.  PCP: Blair Heys, PA-C   Admit date: 10/06/2016 Discharge date: 10/07/2016  Discharge Diagnoses:  noncardiac chest pain dyslipidemia OSA HTN    Follow-up recommendations Follow-up with PCP in 3-5 days , including all  additional recommended appointments as below Follow-up CBC, CMP in 3-5 days Patient will follow-up with PCP to review results of CT chest which was still pending at the time of discharge      Current Discharge Medication List    CONTINUE these medications which have CHANGED   Details          CONTINUE these medications which have NOT CHANGED   Details  aspirin EC 81 MG tablet Take 81 mg by mouth at bedtime.     Calcium-Phosphorus-Vitamin D (CITRACAL +D3 PO) Take 1 tablet by mouth at bedtime.    cetirizine (ZYRTEC) 10 MG tablet Take 10 mg by mouth at bedtime.    FLUoxetine (PROZAC) 40 MG capsule Take 40 mg by mouth at bedtime.     hydrocortisone cream 1 % Apply 1 application topically daily as needed for itching.    Multiple Vitamins-Minerals (MULTIVITAMIN WITH MINERALS) tablet Take 1 tablet by mouth at bedtime.     pantoprazole (PROTONIX) 40 MG tablet Take 40 mg by mouth daily.    rosuvastatin (CRESTOR) 10 MG tablet Take 10 mg by mouth at bedtime. Reported on 09/18/2015 Refills: 5      STOP taking these medications     ibuprofen (ADVIL,MOTRIN) 200 MG tablet      omeprazole (PRILOSEC) 40 MG capsule      ranitidine (ZANTAC) 150 MG capsule          Discharge Condition: *stable  Discharge Instructions Get Medicines reviewed and adjusted: Please take all your medications with you for your next visit with your Primary MD  Please request your Primary MD to go over all hospital tests and procedure/radiological results at the follow up, please ask your Primary MD to get all Hospital records sent to his/her office.  If you experience worsening of your  admission symptoms, develop shortness of breath, life threatening emergency, suicidal or homicidal thoughts you must seek medical attention immediately by calling 911 or calling your MD immediately if symptoms less severe.  You must read complete instructions/literature along with all the possible adverse reactions/side effects for all the Medicines you take and that have been prescribed to you. Take any new Medicines after you have completely understood and accpet all the possible adverse reactions/side effects.   Do not drive when taking Pain medications.   Do not take more than prescribed Pain, Sleep and Anxiety Medications  Special Instructions: If you have smoked or chewed Tobacco in the last 2 yrs please stop smoking, stop any regular Alcohol and or any Recreational drug use.  Wear Seat belts while driving.  Please note  You were cared for by a hospitalist during your hospital stay. Once you are discharged, your primary care physician will handle any further medical issues. Please note that NO REFILLS for any discharge medications will be authorized once you are discharged, as it is imperative that you return to your primary care physician (or establish a relationship with a primary care physician if you do not have one) for your aftercare needs so that they can reassess your need for medications and monitor your lab values.     Allergies  Allergen Reactions  . Lactose Intolerance (Gi) Diarrhea  .  Latex Rash      Disposition: 01-Home or Self Care   Consults: cardiology    Significant Diagnostic Studies:  Dg Chest 2 View  Result Date: 10/06/2016 CLINICAL DATA:  Dizziness began approximately 90 minutes prior to admission associated with 10 out of 10 left upper arm pain. History of diabetes and hyperlipidemia. Current smoker. EXAM: CHEST  2 VIEW COMPARISON:  None in PACs FINDINGS: The lungs are well-expanded. The interstitial markings are coarse. Subtle increased densities  noted lateral to the left heart border. The heart is normal in size. The pulmonary vascularity is not engorged. The mediastinum is normal in width. There is faint calcification in the wall of the aortic arch. There is mild multilevel degenerative disc disease of the thoracic spine. IMPRESSION: Mild interstitial prominence of both lungs likely reflecting the patient's smoking history. There is an area of confluent density adjacent to the left heart border laterally. This may reflect focal atelectasis or infiltrate or less likely a parenchymal nodule. This likely lies in the lower lobe. No evidence of CHF. PA and lateral chest X-ray is recommended in 3-4 weeks following trial of antibiotic therapy to ensure resolution and exclude underlying malignancy. Thoracic aortic atherosclerosis. Electronically Signed   By: David  Martinique M.D.   On: 10/06/2016 14:42   US Abdomen Complete  Result Date: 10/02/2016 CLINICAL DATA:  Right upper quadrant pain for 2-3 weeks EXAM: ABDOMEN ULTRASOUND COMPLETE COMPARISON:  None. FINDINGS: Gallbladder: No gallstones or wall thickening visualized. No sonographic Murphy sign noted by sonographer. Common bile duct: Diameter: 4.5 mm Liver: No focal lesion identified. Increased hepatic parenchymal echogenicity (image 56). IVC: Limited visualization secondary to overlying bowel gas. Pancreas: Sir bowel gas Spleen: Size and appearance within normal limits. Right Kidney: Length: 10.9 cm. Echogenicity within normal limits. No mass or hydronephrosis visualized. Left Kidney: Length: 11.4 cm. Echogenicity within normal limits. No mass or hydronephrosis visualized. Abdominal aorta: No aneurysm visualized. Other findings: None. IMPRESSION: No cholelithiasis or sonographic evidence of acute cholecystitis. Mild increased hepatic echogenicity as can be seen with hepatic steatosis. Electronically Signed   By: Kathreen Devoid   On: 10/02/2016 09:15   Nm Hepato W/eject Fract  Result Date: 10/02/2016 CLINICAL  DATA:  RIGHT upper quadrant pain and nausea for 3 weeks EXAM: NUCLEAR MEDICINE HEPATOBILIARY IMAGING WITH GALLBLADDER EF TECHNIQUE: Sequential images of the abdomen were obtained out to 60 minutes following intravenous administration of radiopharmaceutical. After oral ingestion of Ensure, gallbladder ejection fraction was determined. At 60 min, normal ejection fraction is greater than 33%. RADIOPHARMACEUTICALS:  5.5 mCi Tc-75m Choletec IV COMPARISON:  None FINDINGS: Normal tracer extraction from bloodstream indicating normal hepatocellular function. Normal excretion of tracer into biliary tree. Gallbladder visualized at 49 min. Small bowel visualized at 16 min. No hepatic retention of tracer. Mild patient motion artifacts degrade post Ensure imaging. Subjectively, mildly decreased emptying of tracer from gallbladder following fatty meal stimulation. Calculated gallbladder ejection fraction is 30%, below the normal range. Patient reported no symptoms following Ensure ingestion. Normal gallbladder ejection fraction following Ensure ingestion is greater than 33% at 1 hour. IMPRESSION: Patent biliary tree. Slightly decreased gallbladder ejection fraction of 30% following fatty meal stimulation. Electronically Signed   By: MLavonia DanaM.D.   On: 10/02/2016 16:08   Nm Myocar Multi W/spect W/wall Motion / Ef  Result Date: 10/07/2016 CLINICAL DATA:  Chest pain. Diabetes, hypertension, and hyperlipidemia. Smoker. EXAM: MYOCARDIAL IMAGING WITH SPECT (REST AND PHARMACOLOGIC-STRESS) GATED LEFT VENTRICULAR WALL MOTION STUDY LEFT VENTRICULAR EJECTION FRACTION  TECHNIQUE: Standard myocardial SPECT imaging was performed after resting intravenous injection of 10 mCi Tc-13mtetrofosmin. Subsequently, intravenous infusion of Lexiscan was performed under the supervision of the Cardiology staff. At peak effect of the drug, 30 mCi Tc-964metrofosmin was injected intravenously and standard myocardial SPECT imaging was performed.  Quantitative gated imaging was also performed to evaluate left ventricular wall motion, and estimate left ventricular ejection fraction. COMPARISON:  None. FINDINGS: Perfusion: No decreased activity in the left ventricle on stress imaging to suggest reversible ischemia or infarction. Wall Motion: Normal left ventricular wall motion. No left ventricular dilation. Left Ventricular Ejection Fraction: 65 % End diastolic volume 65 ml End systolic volume 23 ml IMPRESSION: 1. No reversible ischemia or infarction. 2. Normal left ventricular wall motion. 3. Left ventricular ejection fraction 65% 4. Non invasive risk stratification*: Low *2012 Appropriate Use Criteria for Coronary Revascularization Focused Update: J Am Coll Cardiol. 209449;67(5):[email or web address]://content.[email or web address]?articleid=[email or web address] Electronically Signed   By: JoEarle Gell.D.   On: 10/07/2016 13:41    echocardiogram  results pending     MYOCARDIAL IMAGING WITH SPECT (REST AND PHARMACOLOGIC-STRESS 1. No reversible ischemia or infarction.  2. Normal left ventricular wall motion.  3. Left ventricular ejection fraction 65%  4. Non invasive risk stratification*: Low  Filed Weights   10/06/16 1326 10/06/16 1850  Weight: 73.5 kg (162 lb) 73 kg (160 lb 14.4 oz)     Microbiology: No results found for this or any previous visit (from the past 240 hour(s)).     Blood Culture No results found for: SDES, SPBunnCULT, REPTSTATUS    Labs: Results for orders placed or performed during the hospital encounter of 10/06/16 (from the past 48 hour(s))  POC CBG, ED     Status: Abnormal   Collection Time: 10/06/16  1:50 PM  Result Value Ref Range   Glucose-Capillary 155 (H) 65 - 99 mg/dL  CBC     Status: Abnormal   Collection Time: 10/06/16  2:12 PM  Result Value Ref Range   WBC 7.7 4.0 - 10.5 K/uL   RBC 5.00 3.87 - 5.11 MIL/uL   Hemoglobin 15.6 (H) 12.0 - 15.0 g/dL   HCT 44.7 36.0 - 46.0 %   MCV 89.4 78.0 - 100.0  fL   MCH 31.2 26.0 - 34.0 pg   MCHC 34.9 30.0 - 36.0 g/dL   RDW 14.3 11.5 - 15.5 %   Platelets 208 150 - 400 K/uL  Comprehensive metabolic panel     Status: Abnormal   Collection Time: 10/06/16  2:12 PM  Result Value Ref Range   Sodium 139 135 - 145 mmol/L   Potassium 3.6 3.5 - 5.1 mmol/L   Chloride 107 101 - 111 mmol/L   CO2 24 22 - 32 mmol/L   Glucose, Bld 137 (H) 65 - 99 mg/dL   BUN 21 (H) 6 - 20 mg/dL   Creatinine, Ser 0.98 0.44 - 1.00 mg/dL   Calcium 9.4 8.9 - 10.3 mg/dL   Total Protein 7.3 6.5 - 8.1 g/dL   Albumin 4.2 3.5 - 5.0 g/dL   AST 28 15 - 41 U/L   ALT 22 14 - 54 U/L   Alkaline Phosphatase 85 38 - 126 U/L   Total Bilirubin 0.4 0.3 - 1.2 mg/dL   GFR calc non Af Amer 58 (L) >60 mL/min   GFR calc Af Amer >60 >60 mL/min    Comment: (NOTE) The eGFR has been calculated using the CKD EPI equation. This calculation has not been  validated in all clinical situations. eGFR's persistently <60 mL/min signify possible Chronic Kidney Disease.    Anion gap 8 5 - 15  Lipase, blood     Status: None   Collection Time: 10/06/16  2:12 PM  Result Value Ref Range   Lipase 35 11 - 51 U/L  Troponin I     Status: None   Collection Time: 10/06/16  2:12 PM  Result Value Ref Range   Troponin I <0.03 <0.03 ng/mL  Troponin I-serum (0, 3, 6 hours)     Status: None   Collection Time: 10/06/16  7:41 PM  Result Value Ref Range   Troponin I <0.03 <0.03 ng/mL  CBC     Status: Abnormal   Collection Time: 10/06/16  7:41 PM  Result Value Ref Range   WBC 7.9 4.0 - 10.5 K/uL   RBC 5.02 3.87 - 5.11 MIL/uL   Hemoglobin 15.6 (H) 12.0 - 15.0 g/dL   HCT 45.9 36.0 - 46.0 %   MCV 91.4 78.0 - 100.0 fL   MCH 31.1 26.0 - 34.0 pg   MCHC 34.0 30.0 - 36.0 g/dL   RDW 14.2 11.5 - 15.5 %   Platelets 199 150 - 400 K/uL  Creatinine, serum     Status: None   Collection Time: 10/06/16  7:41 PM  Result Value Ref Range   Creatinine, Ser 0.93 0.44 - 1.00 mg/dL   GFR calc non Af Amer >60 >60 mL/min   GFR calc  Af Amer >60 >60 mL/min    Comment: (NOTE) The eGFR has been calculated using the CKD EPI equation. This calculation has not been validated in all clinical situations. eGFR's persistently <60 mL/min signify possible Chronic Kidney Disease.   Troponin I-serum (0, 3, 6 hours)     Status: None   Collection Time: 10/06/16  9:38 PM  Result Value Ref Range   Troponin I <0.03 <0.03 ng/mL  Troponin I-serum (0, 3, 6 hours)     Status: None   Collection Time: 10/07/16  1:01 AM  Result Value Ref Range   Troponin I <0.03 <0.03 ng/mL  D-dimer, quantitative (not at [facility])     Status: Abnormal   Collection Time: 10/07/16  8:01 AM  Result Value Ref Range   D-Dimer, Quant 0.57 (H) 0.00 - 0.50 ug/mL-FEU    Comment: (NOTE) At the manufacturer cut-off of 0.50 ug/mL FEU, this assay has been documented to exclude PE with a sensitivity and negative predictive value of 97 to 99%.  At this time, this assay has not been approved by the FDA to exclude DVT/VTE. Results should be correlated with clinical presentation.   Lipid panel     Status: Abnormal   Collection Time: 10/07/16  8:08 AM  Result Value Ref Range   Cholesterol 111 0 - 200 mg/dL   Triglycerides 113 <150 mg/dL   HDL 33 (L) >40 mg/dL   Total CHOL/HDL Ratio 3.4 RATIO   VLDL 23 0 - 40 mg/dL   LDL Cholesterol 55 0 - 99 mg/dL    Comment:        Total Cholesterol/HDL:CHD Risk Coronary Heart Disease Risk Table                     Men   Women  1/2 Average Risk   3.4   3.3  Average Risk       5.0   4.4  2 X Average Risk   9.6   7.1  3 X Average  Risk  23.4   11.0        Use the calculated Patient Ratio above and the CHD Risk Table to determine the patient's CHD Risk.        ATP III CLASSIFICATION (LDL):  <100     mg/dL   Optimal  100-129  mg/dL   Near or Above                    Optimal  130-159  mg/dL   Borderline  160-189  mg/dL   High  >190     mg/dL   Very High      Lipid Panel     Component Value Date/Time   CHOL 111  10/07/2016 0808   TRIG 113 10/07/2016 0808   HDL 33 (L) 10/07/2016 0808   CHOLHDL 3.4 10/07/2016 0808   VLDL 23 10/07/2016 0808   LDLCALC 55 10/07/2016 0808     No results found for: HGBA1C   Lab Results  Component Value Date   LDLCALC 55 10/07/2016   CREATININE 0.93 10/06/2016     HPI : * 71 y.o.femalewith medical history significant of hyperlipidemia, hypertension, GERD, diet-controlled diabetes, tobacco abuse, OSA who presents with sudden onset of sharp and throbbing left arm pain, left shoulder pain that started at rest this morning. It lasted about 10 minutes and resolved spontaneously. She also admits to some pain surrounding her neck which she thought was due to her degenerative disc disease. Her arm pain returned, although was less intense than her initial pain. This also resolved spontaneously. She sought care at [facility] [place]. Currently, she is asymptomatic. She denies any anterior chest pain, back pain, fevers or chills. She does have chronic nausea as well as indigestion due to her biliary dyskinesia. She has cholecystectomy planned for next Friday. Admitted for chest pain associated with dizziness   Hospital course Atypical chest pain, known biliary dyskinesia  Sudden sharp and throbbing left arm pain which resolved spontaneously, concerning for angina equivalent.  Serial troponins negative 3 no acute ECG changes  Continue Aspirin Echo to rule out wall motion abnormalities pending Cardiology consult for risk stratification-stress Myoview low risk ,EF 65% D-dimer 0.57  , slightly elevated at 0.57,   Bilateral lower extremities are negative for deep vein thrombosis doubt PE based on wells score   HLD -Crestor, LDL 55  HTN -Lisinopril dc due to low BP  GERD -PPI, zantac   DM type 2 -Diet controlled, hemoglobin A1c pending     Tobacco abuse -Cessation counseling done, refused nicotine patch  Biliary dyskinesia -Cholecystectomy planned for  next Friday   Abnormal CXR -Asymptomatic from pulmonary standpoint. CXR with area of confluent density adjacent to the left heart border laterally. PA and lateral CT CHEST ordered by cardiology, results pending at this time  History of vertigo Patient presented with dizziness and had a negative MRI last year and has seen Dr. Tomi Likens with neurology May need repeat MRI of vertigo/dizziness persists,attributed to cervical radiculopathy and vertebro basilar insufficiency PT evaluation for possible vestibular rehabilitation      Discharge Exam:  Blood pressure (!) 92/47, pulse 64, temperature 97.8 F (36.6 C), temperature source Oral, resp. rate 18, height 5' 5"  (1.651 m), weight 73 kg (160 lb 14.4 oz), SpO2 99 %.  General exam: Appears calm and comfortable  Respiratory system: Clear to auscultation. Respiratory effort normal. Cardiovascular system: S1 & S2 heard, RRR. No JVD, murmurs, rubs, gallops or clicks. No pedal edema. Gastrointestinal system:  Abdomen is nondistended, soft and nontender. No organomegaly or masses felt. Normal bowel sounds heard. Central nervous system: Alert and oriented. No focal neurological deficits. Extremities: Symmetric 5 x 5 power. Skin: No rashes, lesions or ulcers Psychiatry: Judgement and insight appear normal. Mood & affect appropriate.      Follow-up Information    Long, Caryl Pina, [place]. Call.   Specialty:  Physician Assistant Why:  hospital follow up in 3-5 days  Contact information: [address] [place] [address] [phone]           Signed: Reyne Dumas 10/07/2016, 5:06 PM        Time spent >1 hour

## 2016-10-07 NOTE — Consult Note (Signed)
Cardiology Consultation:   Patient ID: Krista Austin; [id number]; Oct 16, 1945   Admit date: 10/06/2016 Date of Consult: 10/07/2016  Primary Care Provider: Blair Heys, PA-C Primary Cardiologist: New/Callahan Wild Primary Electrophysiologist:  None   Patient Profile:   Krista Austin is a 71 y.o. female with a hx of Smoking, cervical spine disease DM, elevated lipids who is being seen today for the evaluation of chest pain at the request of Abrol.  History of Present Illness:   Krista Austin 71 y.o. had sharp left sided arm pain yesterday morning. Started at rest radiated to shoulder and chest. Lasted 10 minutes and resolved spontaneously. Recurred multiple times During day she went to med [facility]. She has biliary dyskinesis and has some chronic nausea and indigestion but this pain was different. She denies pleuritic component LE edema or leg pain She walks daily without issue She has OSA and wears CPAP She is concerned about heart disease due to her multiple risk factors has not had previous stress testing. R/O ECG no acute ECG changes Admission CXR abnormal No cough sputum or fever No weight loss   Past Medical History:  Diagnosis Date  . Adenomatous polyp   . Cataract   . Celiac disease    genetically positive  . DDD (degenerative disc disease), cervical   . Diabetes mellitus without complication ([address])   . GERD (gastroesophageal reflux disease)   . Hyperlipidemia   . Sleep apnea   . Thyroid nodule     Past Surgical History:  Procedure Laterality Date  . CATARACT EXTRACTION  09/10/2014   and 09/24/14 by Darleen Crocker  . SPINE SURGERY  2007   lumbar  . SPINE SURGERY  2015   cervical fusion  . TONSILLECTOMY  05/05/1983  . TUBAL LIGATION  02/09/1979     Inpatient Medications: Scheduled Meds: . aspirin EC  81 mg Oral QHS  . enoxaparin (LOVENOX) injection  40 mg Subcutaneous Q24H  . famotidine  10 mg Oral Daily  . FLUoxetine  40 mg Oral QHS  . lisinopril  10 mg Oral QHS  .  pantoprazole  40 mg Oral Daily  . rosuvastatin  10 mg Oral QHS   Continuous Infusions:  PRN Meds: acetaminophen, nitroGLYCERIN, ondansetron (ZOFRAN) IV  Allergies:    Allergies  Allergen Reactions  . Lactose Intolerance (Gi) Diarrhea  . Latex Rash    Social History:   Social History   Social History  . Marital status: Single    Spouse name: N/A  . Number of children: N/A  . Years of education: N/A   Occupational History  . Not on file.   Social History Main Topics  . Smoking status: Current Some Day Smoker  . Smokeless tobacco: Never Used  . Alcohol use No  . Drug use: No  . Sexual activity: Not on file   Other Topics Concern  . Not on file   Social History Narrative  . No narrative on file    Family History:   The patient's family history includes Arthritis in her maternal grandmother, mother, and sister; Atrial fibrillation in her father; Breast cancer in her maternal grandmother; COPD in her father; Dementia in her maternal grandmother; Diabetes type I in her maternal grandfather; Fibromyalgia in her sister; Hyperlipidemia in her mother and sister; Hypertension in her maternal grandmother; Hypothyroidism in her mother, sister, and sister; Macular degeneration in her mother; Mitral valve prolapse in her mother; Osteoarthritis in her sister; Other in her other.  ROS:  Please see the history of present illness.  ROS  All other ROS reviewed and negative.     Physical Exam/Data:   Vitals:   10/06/16 2006 10/06/16 2136 10/07/16 0003 10/07/16 0435  BP: 120/75 120/76 116/65 101/63  Pulse: 69  63 63  Resp: 18  18 18   Temp: 98.3 F (36.8 C)  97.9 F (36.6 C) 98.3 F (36.8 C)  TempSrc: Oral  Oral Oral  SpO2: 97%  97% 98%  Weight:      Height:        Intake/Output Summary (Last 24 hours) at 10/07/16 0753 Last data filed at 10/07/16 0000  Gross per 24 hour  Intake              240 ml  Output              100 ml  Net              140 ml   Filed Weights    10/06/16 1326 10/06/16 1850  Weight: 162 lb (73.5 kg) 160 lb 14.4 oz (73 kg)   Body mass index is 26.78 kg/m.  General:  Well nourished, well developed, in no acute distress  HEENT: poor dentition Lymph: no adenopathy Neck: no JVD Endocrine:  No thryomegaly Vascular: No carotid bruits; FA pulses 2+ bilaterally without bruits  Cardiac:  normal S1, S2; RRR; no murmur   Lungs:  clear to auscultation bilaterally, no wheezing, rhonchi or rales  Abd: soft, nontender, no hepatomegaly  Ext: no edema Musculoskeletal:  No deformities, BUE and BLE strength normal and equal Skin: warm and dry  Neuro:  CNs 2-12 intact, no focal abnormalities noted Psych:  Normal affect    EKG:  The EKG was personally reviewed and demonstrates NSR no acute changes  Relevant CV Studies: Echo pending   Laboratory Data:  Chemistry Recent Labs Lab 10/06/16 1412 10/06/16 1941  NA 139  --   K 3.6  --   CL 107  --   CO2 24  --   GLUCOSE 137*  --   BUN 21*  --   CREATININE 0.98 0.93  CALCIUM 9.4  --   GFRNONAA 58* >60  GFRAA >60 >60  ANIONGAP 8  --      Recent Labs Lab 10/06/16 1412  PROT 7.3  ALBUMIN 4.2  AST 28  ALT 22  ALKPHOS 85  BILITOT 0.4   Hematology Recent Labs Lab 10/06/16 1412 10/06/16 1941  WBC 7.7 7.9  RBC 5.00 5.02  HGB 15.6* 15.6*  HCT 44.7 45.9  MCV 89.4 91.4  MCH 31.2 31.1  MCHC 34.9 34.0  RDW 14.3 14.2  PLT 208 199   Cardiac Enzymes Recent Labs Lab 10/06/16 1412 10/06/16 1941 10/06/16 2138 10/07/16 0101  TROPONINI <0.03 <0.03 <0.03 <0.03   No results for input(s): TROPIPOC in the last 168 hours.  BNPNo results for input(s): BNP, PROBNP in the last 168 hours.  DDimer No results for input(s): DDIMER in the last 168 hours.  Radiology/Studies:  Dg Chest 2 View  Result Date: 10/06/2016 CLINICAL DATA:  Dizziness began approximately 90 minutes prior to admission associated with 10 out of 10 left upper arm pain. History of diabetes and hyperlipidemia. Current  smoker. EXAM: CHEST  2 VIEW COMPARISON:  None in PACs FINDINGS: The lungs are well-expanded. The interstitial markings are coarse. Subtle increased densities noted lateral to the left heart border. The heart is normal in size. The pulmonary vascularity is not engorged. The  mediastinum is normal in width. There is faint calcification in the wall of the aortic arch. There is mild multilevel degenerative disc disease of the thoracic spine. IMPRESSION: Mild interstitial prominence of both lungs likely reflecting the patient's smoking history. There is an area of confluent density adjacent to the left heart border laterally. This may reflect focal atelectasis or infiltrate or less likely a parenchymal nodule. This likely lies in the lower lobe. No evidence of CHF. PA and lateral chest X-ray is recommended in 3-4 weeks following trial of antibiotic therapy to ensure resolution and exclude underlying malignancy. Thoracic aortic atherosclerosis. Electronically Signed   By: David  Martinique M.D.   On: 10/06/2016 14:42    Assessment and Plan:   1. Chest/Arm pain: somewhat atypical r/o ECG ok given ER evaluation and risk factors will order exercise myovue.  2. Smoking:  Abnormal CXR will order non contrast chest CT to further evaluate  3. OSA: continue CPAP 4. DM:  Discussed low carb diet.  Target hemoglobin A1c is 6.5 or less.  Continue current medications. 5. HTN:  Well controlled.  Continue current medications and low sodium Dash type diet.   6. Cholesterol:  On statin    Signed, Jenkins Rouge, MD  10/07/2016 7:53 AM

## 2016-10-07 NOTE — Care Management Obs Status (Signed)
[facility] NOTIFICATION   Patient Details  Name: Krista Austin MRN: [id number] Date of Birth: December 11, 1945   Medicare Observation Status Notification Given:  Yes    Carles Collet, RN 10/07/2016, 2:38 PM

## 2016-10-07 NOTE — Progress Notes (Signed)
Myoview low risk.   Kerin Ransom PA-C 10/07/2016 2:43 PM

## 2016-10-07 NOTE — Progress Notes (Signed)
PT Cancellation Note  Patient Details Name: Krista Austin MRN: [id number] DOB: 03-26-1946   Cancelled Treatment:    Reason Eval/Treat Not Completed: Patient at procedure or test/unavailable Pt getting ECHO. Will follow up as time allows.   [name] 10/07/2016, 3:20 PM  Wray Kearns, [place], DPT [phone]

## 2016-10-07 NOTE — Progress Notes (Signed)
Patient presented for exercise myoview however changed to [facility] given hx of dizziness with ambulation. Tolerated procedure well. Pending final stress imaging result.

## 2016-10-07 NOTE — Progress Notes (Signed)
  Echocardiogram 2D Echocardiogram has been performed.  Randa Lynn Sequan Auxier 10/07/2016, 4:34 PM

## 2016-10-07 NOTE — Discharge Instructions (Signed)
Nonspecific Chest Pain °Chest pain can be caused by many different conditions. There is always a chance that your pain could be related to something serious, such as a heart attack or a blood clot in your lungs. Chest pain can also be caused by conditions that are not life-threatening. If you have chest pain, it is very important to follow up with your health care provider. °What are the causes? °Causes of this condition include: °· Heartburn. °· Pneumonia or bronchitis. °· Anxiety or stress. °· Inflammation around your heart (pericarditis) or lung (pleuritis or pleurisy). °· A blood clot in your lung. °· A collapsed lung (pneumothorax). This can develop suddenly on its own (spontaneous pneumothorax) or from trauma to the chest. °· Shingles infection (varicella-zoster virus). °· Heart attack. °· Damage to the bones, muscles, and cartilage that make up your chest wall. This can include: °? Bruised bones due to injury. °? Strained muscles or cartilage due to frequent or repeated coughing or overwork. °? Fracture to one or more ribs. °? Sore cartilage due to inflammation (costochondritis). ° °What increases the risk? °Risk factors for this condition may include: °· Activities that increase your risk for trauma or injury to your chest. °· Respiratory infections or conditions that cause frequent coughing. °· Medical conditions or overeating that can cause heartburn. °· Heart disease or family history of heart disease. °· Conditions or health behaviors that increase your risk of developing a blood clot. °· Having had chicken pox (varicella zoster). ° °What are the signs or symptoms? °Chest pain can feel like: °· Burning or tingling on the surface of your chest or deep in your chest. °· Crushing, pressure, aching, or squeezing pain. °· Dull or sharp pain that is worse when you move, cough, or take a deep breath. °· Pain that is also felt in your back, neck, shoulder, or arm, or pain that spreads to any of these  areas. ° °Your chest pain may come and go, or it may stay constant. °How is this diagnosed? °Lab tests or other studies may be needed to find the cause of your pain. Your health care provider may have you take a test called an ECG (electrocardiogram). An ECG records your heartbeat patterns at the time the test is performed. You may also have other tests, such as: °· Transthoracic echocardiogram (TTE). In this test, sound waves are used to create a picture of the heart structures and to look at how blood flows through your heart. °· Transesophageal echocardiogram (TEE). This is a more advanced imaging test that takes images from inside your body. It allows your health care provider to see your heart in finer detail. °· Cardiac monitoring. This allows your health care provider to monitor your heart rate and rhythm in real time. °· Holter monitor. This is a portable device that records your heartbeat and can help to diagnose abnormal heartbeats. It allows your health care provider to track your heart activity for several days, if needed. °· Stress tests. These can be done through exercise or by taking medicine that makes your heart beat more quickly. °· Blood tests. °· Other imaging tests. ° °How is this treated? °Treatment depends on what is causing your chest pain. Treatment may include: °· Medicines. These may include: °? Acid blockers for heartburn. °? Anti-inflammatory medicine. °? Pain medicine for inflammatory conditions. °? Antibiotic medicine, if an infection is present. °? Medicines to dissolve blood clots. °? Medicines to treat coronary artery disease (CAD). °· Supportive care for conditions that   do not require medicines. This may include: °? Resting. °? Applying heat or cold packs to injured areas. °? Limiting activities until pain decreases. ° °Follow these instructions at home: °Medicines °· If you were prescribed an antibiotic, take it as told by your health care provider. Do not stop taking the  antibiotic even if you start to feel better. °· Take over-the-counter and prescription medicines only as told by your health care provider. °Lifestyle °· Do not use any products that contain nicotine or tobacco, such as cigarettes and e-cigarettes. If you need help quitting, ask your health care provider. °· Do not drink alcohol. °· Make lifestyle changes as directed by your health care provider. These may include: °? Getting regular exercise. Ask your health care provider to suggest some activities that are safe for you. °? Eating a heart-healthy diet. A registered dietitian can help you to learn healthy eating options. °? Maintaining a healthy weight. °? Managing diabetes, if necessary. °? Reducing stress, such as with yoga or relaxation techniques. °General instructions °· Avoid any activities that bring on chest pain. °· If heartburn is the cause for your chest pain, raise (elevate) the head of your bed about 6 inches (15 cm) by putting blocks under the legs. Sleeping with more pillows does not effectively relieve heartburn because it only changes the position of your head. °· Keep all follow-up visits as told by your health care provider. This is important. This includes any further testing if your chest pain does not go away. °Contact a health care provider if: °· Your chest pain does not go away. °· You have a rash with blisters on your chest. °· You have a fever. °· You have chills. °Get help right away if: °· Your chest pain is worse. °· You have a cough that gets worse, or you cough up blood. °· You have severe pain in your abdomen. °· You have severe weakness. °· You faint. °· You have sudden, unexplained chest discomfort. °· You have sudden, unexplained discomfort in your arms, back, neck, or jaw. °· You have shortness of breath at any time. °· You suddenly start to sweat, or your skin gets clammy. °· You feel nauseous or you vomit. °· You suddenly feel light-headed or dizzy. °· Your heart begins to beat  quickly, or it feels like it is skipping beats. °These symptoms may represent a serious problem that is an emergency. Do not wait to see if the symptoms will go away. Get medical help right away. Call your local emergency services (911 in the [place] Do not drive yourself to the hospital. °This information is not intended to replace advice given to you by your health care provider. Make sure you discuss any questions you have with your health care provider. °Document Released: 01/28/2005 Document Revised: 01/13/2016 Document Reviewed: 01/13/2016 °[employer] Interactive Patient Education © 2017 [employer] ° °

## 2016-10-07 NOTE — Progress Notes (Addendum)
[facility] PROGRESS NOTE  Krista Austin NID:[id number] DOB: 1945/06/28 DOA: 10/06/2016   PCP: Blair Heys, PA-C     Assessment/Plan: Active Problems:   Chest pain   71 y.o. female with medical history significant of hyperlipidemia, hypertension, GERD, diet-controlled diabetes, tobacco abuse, OSA who presents with sudden onset of sharp and throbbing left arm pain, left shoulder pain that started at rest this morning. It lasted about 10 minutes and resolved spontaneously. She also admits to some pain surrounding her neck which she thought was due to her degenerative disc disease. Her arm pain returned, although was less intense than her initial pain. This also resolved spontaneously. She sought care at [facility] [place]. Currently, she is asymptomatic. She denies any anterior chest pain, back pain, fevers or chills. She does have chronic nausea as well as indigestion due to her biliary dyskinesia. She has cholecystectomy planned for next Friday. Admitted for chest pain associated with dizziness   Assessment and plan Atypical chest pain, known biliary dyskinesia  -Sudden sharp and throbbing left arm pain which resolved spontaneously, concerning for angina equivalent.  -Serial troponins negative 3  no acute ECG changes  Aspirin Echo to rule out wall motion abnormalities pending Cardiology consult for risk stratification-stress Myoview low risk ,EF 65% D-dimer 0.57  , slightly elevated at 0.57,   venous Doppler to rule out DVT, doubt PE based on wells score  HLD -Crestor, LDL 55  HTN -Lisinopril  GERD -PPI, zantac   DM type 2 -Diet controlled, check hemoglobin A1c -SSI   Tobacco abuse -Cessation counseling   Biliary dyskinesia -Cholecystectomy planned for next Friday   Abnormal CXR -Asymptomatic from pulmonary standpoint. CXR with area of confluent density adjacent to the left heart border laterally. PA and lateral CT CHEST ordered by  cardiology  History of vertigo Patient presented with dizziness and had a negative MRI last year and has seen Dr. Tomi Likens with neurology May need repeat MRI of vertigo/dizziness persists PT evaluation for possible vestibular rehabilitation    DVT prophylaxsis  Lovenox  Code Status:  Full code      Family Communication: Discussed in detail with the patient, all imaging results, lab results explained to the patient   Disposition Plan: dc home if work up negative      Consultants:  Cardiology  Procedures:  None  Antibiotics: Anti-infectives    None         HPI/Subjective:  Currently chest pain-free, admits to smoking a pack a day  Objective: Vitals:   10/06/16 2006 10/06/16 2136 10/07/16 0003 10/07/16 0435  BP: 120/75 120/76 116/65 101/63  Pulse: 69  63 63  Resp: 18  18 18   Temp: 98.3 F (36.8 C)  97.9 F (36.6 C) 98.3 F (36.8 C)  TempSrc: Oral  Oral Oral  SpO2: 97%  97% 98%  Weight:      Height:        Intake/Output Summary (Last 24 hours) at 10/07/16 0756 Last data filed at 10/07/16 0000  Gross per 24 hour  Intake              240 ml  Output              100 ml  Net              140 ml    Exam:  Examination:  General exam: Appears calm and comfortable  Respiratory system: Clear to auscultation. Respiratory effort normal. Cardiovascular system:  S1 & S2 heard, RRR. No JVD, murmurs, rubs, gallops or clicks. No pedal edema. Gastrointestinal system: Abdomen is nondistended, soft and nontender. No organomegaly or masses felt. Normal bowel sounds heard. Central nervous system: Alert and oriented. No focal neurological deficits. Extremities: Symmetric 5 x 5 power. Skin: No rashes, lesions or ulcers Psychiatry: Judgement and insight appear normal. Mood & affect appropriate.     Data Reviewed: I have personally reviewed following labs and imaging studies  Micro Results No results found for this or any previous visit (from the past 240  hour(s)).  Radiology Reports Dg Chest 2 View  Result Date: 10/06/2016 CLINICAL DATA:  Dizziness began approximately 90 minutes prior to admission associated with 10 out of 10 left upper arm pain. History of diabetes and hyperlipidemia. Current smoker. EXAM: CHEST  2 VIEW COMPARISON:  None in PACs FINDINGS: The lungs are well-expanded. The interstitial markings are coarse. Subtle increased densities noted lateral to the left heart border. The heart is normal in size. The pulmonary vascularity is not engorged. The mediastinum is normal in width. There is faint calcification in the wall of the aortic arch. There is mild multilevel degenerative disc disease of the thoracic spine. IMPRESSION: Mild interstitial prominence of both lungs likely reflecting the patient's smoking history. There is an area of confluent density adjacent to the left heart border laterally. This may reflect focal atelectasis or infiltrate or less likely a parenchymal nodule. This likely lies in the lower lobe. No evidence of CHF. PA and lateral chest X-ray is recommended in 3-4 weeks following trial of antibiotic therapy to ensure resolution and exclude underlying malignancy. Thoracic aortic atherosclerosis. Electronically Signed   By: David  Martinique M.D.   On: 10/06/2016 14:42   US Abdomen Complete  Result Date: 10/02/2016 CLINICAL DATA:  Right upper quadrant pain for 2-3 weeks EXAM: ABDOMEN ULTRASOUND COMPLETE COMPARISON:  None. FINDINGS: Gallbladder: No gallstones or wall thickening visualized. No sonographic Murphy sign noted by sonographer. Common bile duct: Diameter: 4.5 mm Liver: No focal lesion identified. Increased hepatic parenchymal echogenicity (image 56). IVC: Limited visualization secondary to overlying bowel gas. Pancreas: Sir bowel gas Spleen: Size and appearance within normal limits. Right Kidney: Length: 10.9 cm. Echogenicity within normal limits. No mass or hydronephrosis visualized. Left Kidney: Length: 11.4 cm.  Echogenicity within normal limits. No mass or hydronephrosis visualized. Abdominal aorta: No aneurysm visualized. Other findings: None. IMPRESSION: No cholelithiasis or sonographic evidence of acute cholecystitis. Mild increased hepatic echogenicity as can be seen with hepatic steatosis. Electronically Signed   By: Kathreen Devoid   On: 10/02/2016 09:15   Nm Hepato W/eject Fract  Result Date: 10/02/2016 CLINICAL DATA:  RIGHT upper quadrant pain and nausea for 3 weeks EXAM: NUCLEAR MEDICINE HEPATOBILIARY IMAGING WITH GALLBLADDER EF TECHNIQUE: Sequential images of the abdomen were obtained out to 60 minutes following intravenous administration of radiopharmaceutical. After oral ingestion of Ensure, gallbladder ejection fraction was determined. At 60 min, normal ejection fraction is greater than 33%. RADIOPHARMACEUTICALS:  5.5 mCi Tc-40m  Choletec IV COMPARISON:  None FINDINGS: Normal tracer extraction from bloodstream indicating normal hepatocellular function. Normal excretion of tracer into biliary tree. Gallbladder visualized at 49 min. Small bowel visualized at 16 min. No hepatic retention of tracer. Mild patient motion artifacts degrade post Ensure imaging. Subjectively, mildly decreased emptying of tracer from gallbladder following fatty meal stimulation. Calculated gallbladder ejection fraction is 30%, below the normal range. Patient reported no symptoms following Ensure ingestion. Normal gallbladder ejection fraction following Ensure ingestion is greater  than 33% at 1 hour. IMPRESSION: Patent biliary tree. Slightly decreased gallbladder ejection fraction of 30% following fatty meal stimulation. Electronically Signed   By: Lavonia Dana M.D.   On: 10/02/2016 16:08     CBC  Recent Labs Lab 10/06/16 1412 10/06/16 1941  WBC 7.7 7.9  HGB 15.6* 15.6*  HCT 44.7 45.9  PLT 208 199  MCV 89.4 91.4  MCH 31.2 31.1  MCHC 34.9 34.0  RDW 14.3 14.2    Chemistries   Recent Labs Lab 10/06/16 1412  10/06/16 1941  NA 139  --   K 3.6  --   CL 107  --   CO2 24  --   GLUCOSE 137*  --   BUN 21*  --   CREATININE 0.98 0.93  CALCIUM 9.4  --   AST 28  --   ALT 22  --   ALKPHOS 85  --   BILITOT 0.4  --    ------------------------------------------------------------------------------------------------------------------ estimated creatinine clearance is 57.1 mL/min (by C-G formula based on SCr of 0.93 mg/dL). ------------------------------------------------------------------------------------------------------------------ No results for input(s): HGBA1C in the last 72 hours. ------------------------------------------------------------------------------------------------------------------ No results for input(s): CHOL, HDL, LDLCALC, TRIG, CHOLHDL, LDLDIRECT in the last 72 hours. ------------------------------------------------------------------------------------------------------------------ No results for input(s): TSH, T4TOTAL, T3FREE, THYROIDAB in the last 72 hours.  Invalid input(s): FREET3 ------------------------------------------------------------------------------------------------------------------ No results for input(s): VITAMINB12, FOLATE, FERRITIN, TIBC, IRON, RETICCTPCT in the last 72 hours.  Coagulation profile No results for input(s): INR, PROTIME in the last 168 hours.  No results for input(s): DDIMER in the last 72 hours.  Cardiac Enzymes  Recent Labs Lab 10/06/16 1941 10/06/16 2138 10/07/16 0101  TROPONINI <0.03 <0.03 <0.03   ------------------------------------------------------------------------------------------------------------------ Invalid input(s): POCBNP   CBG:  Recent Labs Lab 10/06/16 1350  GLUCAP 155*       Studies: Dg Chest 2 View  Result Date: 10/06/2016 CLINICAL DATA:  Dizziness began approximately 90 minutes prior to admission associated with 10 out of 10 left upper arm pain. History of diabetes and hyperlipidemia. Current smoker.  EXAM: CHEST  2 VIEW COMPARISON:  None in PACs FINDINGS: The lungs are well-expanded. The interstitial markings are coarse. Subtle increased densities noted lateral to the left heart border. The heart is normal in size. The pulmonary vascularity is not engorged. The mediastinum is normal in width. There is faint calcification in the wall of the aortic arch. There is mild multilevel degenerative disc disease of the thoracic spine. IMPRESSION: Mild interstitial prominence of both lungs likely reflecting the patient's smoking history. There is an area of confluent density adjacent to the left heart border laterally. This may reflect focal atelectasis or infiltrate or less likely a parenchymal nodule. This likely lies in the lower lobe. No evidence of CHF. PA and lateral chest X-ray is recommended in 3-4 weeks following trial of antibiotic therapy to ensure resolution and exclude underlying malignancy. Thoracic aortic atherosclerosis. Electronically Signed   By: David  Martinique M.D.   On: 10/06/2016 14:42      No results found for: HGBA1C Lab Results  Component Value Date   CREATININE 0.93 10/06/2016       Scheduled Meds: . aspirin EC  81 mg Oral QHS  . enoxaparin (LOVENOX) injection  40 mg Subcutaneous Q24H  . famotidine  10 mg Oral Daily  . FLUoxetine  40 mg Oral QHS  . lisinopril  10 mg Oral QHS  . pantoprazole  40 mg Oral Daily  . rosuvastatin  10 mg Oral QHS   Continuous Infusions:  LOS: 0 days    Time spent: >30 MINS    Reyne Dumas  [facility] Pager [phone]. If 7PM-7AM, please contact night-coverage at [email or web address], password [facility] 10/07/2016, 7:56 AM  LOS: 0 days

## 2016-10-07 NOTE — Progress Notes (Signed)
*  Preliminary Results* Bilateral lower extremity venous duplex completed. Bilateral lower extremities are negative for deep vein thrombosis. There is no evidence of Baker's cyst bilaterally.  10/07/2016 5:10 PM Maudry Mayhew, BS, RVT, RDCS, RDMS

## 2016-10-07 NOTE — Progress Notes (Signed)
Pt discharged to home, ambulatory, discharge education complete, advised pt to follow up with PCP for ct results and post hospital visit, pt accompanied by son and friend, left facility via private vehicle.  Edward Qualia RN

## 2016-10-08 LAB — HEMOGLOBIN A1C
Hgb A1c MFr Bld: 6 % — ABNORMAL HIGH (ref 4.8–5.6)
Mean Plasma Glucose: 126 mg/dL

## 2016-10-14 ENCOUNTER — Encounter (HOSPITAL_COMMUNITY): Payer: Self-pay | Admitting: *Deleted

## 2016-10-14 NOTE — Progress Notes (Signed)
Patient verbalized understanding of instructions  7 days prior to surgery STOP taking any Aspirin, Aleve, Naproxen, Ibuprofen, Motrin, Advil, Goody's, BC's, all herbal medications, fish oil, and all vitamins

## 2016-10-15 ENCOUNTER — Ambulatory Visit: Payer: Self-pay | Admitting: Surgery

## 2016-10-15 MED ORDER — CEFAZOLIN SODIUM-DEXTROSE 2-4 GM/100ML-% IV SOLN
2.0000 g | INTRAVENOUS | Status: AC
  Administered 2016-10-16: 2 g via INTRAVENOUS
  Filled 2016-10-15 (×2): qty 100

## 2016-10-15 NOTE — H&P (Signed)
History of Present Illness Krista Austin. Selma Mink MD; 10/15/2016 4:27 PM) The patient is a 71 year old female who presents with abdominal pain. PCP - [name] Referring - Dr. Juanita Craver Reason: gallbladder symptoms  This is a 71 yo female with PMH significant for OSA, hypertension, and dyslipidemia who presents with a 5-6 week history of epigastric pain radiating through to her back. These intermittent symptoms are exacerbated by eating greasy foods. These attacks are accompanied by nausea as well as diarrhea. The patient has lost about 10 pounds in the last 8 weeks because of decreased by mouth intake especially of red meat. She was referred for evaluation by GI. An ultrasound was unremarkable. A HIDA scan was positive for biliary dyskinesia. The patient has a constant epigastric discomfort with intermittent severe attacks.  Her only abdominal surgery was a tubal ligation.  CLINICAL DATA: Right upper quadrant pain for 2-3 weeks  EXAM: ABDOMEN ULTRASOUND COMPLETE  COMPARISON: None.  FINDINGS: Gallbladder: No gallstones or wall thickening visualized. No sonographic Murphy sign noted by sonographer.  Common bile duct: Diameter: 4.5 mm  Liver: No focal lesion identified. Increased hepatic parenchymal echogenicity (image 56).  IVC: Limited visualization secondary to overlying bowel gas.  Pancreas: Sir bowel gas  Spleen: Size and appearance within normal limits.  Right Kidney: Length: 10.9 cm. Echogenicity within normal limits. No mass or hydronephrosis visualized.  Left Kidney: Length: 11.4 cm. Echogenicity within normal limits. No mass or hydronephrosis visualized.  Abdominal aorta: No aneurysm visualized.  Other findings: None.  IMPRESSION: No cholelithiasis or sonographic evidence of acute cholecystitis.  Mild increased hepatic echogenicity as can be seen with hepatic steatosis.   Electronically Signed By: Kathreen Devoid On: 10/02/2016 09:15  CLINICAL  DATA: RIGHT upper quadrant pain and nausea for 3 weeks  EXAM: NUCLEAR MEDICINE HEPATOBILIARY IMAGING WITH GALLBLADDER EF  TECHNIQUE: Sequential images of the abdomen were obtained out to 60 minutes following intravenous administration of radiopharmaceutical. After oral ingestion of Ensure, gallbladder ejection fraction was determined. At 60 min, normal ejection fraction is greater than 33%.  RADIOPHARMACEUTICALS: 5.5 mCi Tc-54m Choletec IV  COMPARISON: None  FINDINGS: Normal tracer extraction from bloodstream indicating normal hepatocellular function.  Normal excretion of tracer into biliary tree.  Gallbladder visualized at 49 min.  Small bowel visualized at 16 min.  No hepatic retention of tracer.  Mild patient motion artifacts degrade post Ensure imaging.  Subjectively, mildly decreased emptying of tracer from gallbladder following fatty meal stimulation.  Calculated gallbladder ejection fraction is 30%, below the normal range.  Patient reported no symptoms following Ensure ingestion.  Normal gallbladder ejection fraction following Ensure ingestion is greater than 33% at 1 hour.  IMPRESSION: Patent biliary tree.  Slightly decreased gallbladder ejection fraction of 30% following fatty meal stimulation.   Electronically Signed By: Lavonia Dana M.D. On: 10/02/2016 16:08    Past Surgical History Malachy Moan, [place]; 10/15/2016 3:39 PM) Cataract Surgery Bilateral. Spinal Surgery - Lower Back Spinal Surgery - Neck Tonsillectomy  Diagnostic Studies History Malachy Moan, [place]; 10/15/2016 3:39 PM) Colonoscopy 1-5 years ago Mammogram within last year  Allergies Malachy Moan, RMA; 10/15/2016 3:43 PM) Latex Lactose Intolerance *DIGESTIVE AIDS*  Medication History Malachy Moan, RMA; 10/15/2016 3:47 PM) Rosuvastatin Calcium (10MG  Tablet, Oral at bedtime) Active. FLUoxetine HCl (40MG  Capsule, Oral at bedtime) Active. Pantoprazole  Sodium (40MG  Tablet DR, Oral at bedtime) Active. Lisinopril (10MG  Tablet, Oral at bedtime) Active. Citracal +D3 (Oral at bedtime) Specific strength unknown - Active. Daily Multiple Vitamin (Oral at bedtime) Active.  ZyrTEC (10MG  Tablet, Oral at bedtime) Active. Omega-3 (Oral) Specific strength unknown - Active. BuPROPion HCl ER (SR) (150MG  Tablet ER 12HR, Oral) Active. ALPRAZolam (0.5MG  Tablet, Oral three times daily as needed) Active. Advil (200MG  Tablet, 2 tabs Oral as needed) Active. Medications Reconciled  Social History Malachy Moan, [place]; 10/15/2016 3:39 PM) Caffeine use Tea. No alcohol use No drug use  Family History Malachy Moan, [place]; 10/15/2016 3:39 PM) Anesthetic complications Daughter. Arthritis Father, Mother, Sister, Son. Colon Polyps Daughter. Depression Father, Mother. Heart Disease Father, Mother. Migraine Headache Daughter. Thyroid problems Sister.  Pregnancy / Birth History Malachy Moan, [place]; 10/15/2016 3:39 PM) Age at menarche 89 years. Age of menopause 58-50 Contraceptive History Oral contraceptives. Gravida 2 Length (months) of breastfeeding 3-6 Maternal age 48-30 Para 3  Other Problems Malachy Moan, [place]; 10/15/2016 3:39 PM) Arthritis Back Pain Sleep Apnea Thyroid Disease Vascular Disease     Review of Systems Malachy Moan RMA; 10/15/2016 3:39 PM) General Not Present- Appetite Loss, Chills, Fatigue, Fever, Night Sweats, Weight Gain and Weight Loss. Skin Not Present- Change in Wart/Mole, Dryness, Hives, Jaundice, New Lesions, Non-Healing Wounds, Rash and Ulcer. HEENT Present- Wears glasses/contact lenses. Not Present- Earache, Hearing Loss, Hoarseness, Nose Bleed, Oral Ulcers, Ringing in the Ears, Seasonal Allergies, Sinus Pain, Sore Throat, Visual Disturbances and Yellow Eyes. Respiratory Not Present- Bloody sputum, Chronic Cough, Difficulty Breathing, Snoring and Wheezing. Breast Not Present-  Breast Mass, Breast Pain, Nipple Discharge and Skin Changes. Cardiovascular Not Present- Chest Pain, Difficulty Breathing Lying Down, Leg Cramps, Palpitations, Rapid Heart Rate, Shortness of Breath and Swelling of Extremities. Gastrointestinal Present- Abdominal Pain and Nausea. Not Present- Bloating, Bloody Stool, Change in Bowel Habits, Chronic diarrhea, Constipation, Difficulty Swallowing, Excessive gas, Gets full quickly at meals, Hemorrhoids, Indigestion, Rectal Pain and Vomiting. Female Genitourinary Present- Frequency. Not Present- Nocturia, Painful Urination, Pelvic Pain and Urgency. Musculoskeletal Present- Back Pain. Not Present- Joint Pain, Joint Stiffness, Muscle Pain, Muscle Weakness and Swelling of Extremities. Neurological Not Present- Decreased Memory, Fainting, Headaches, Numbness, Seizures, Tingling, Tremor, Trouble walking and Weakness. Psychiatric Not Present- Anxiety, Bipolar, Change in Sleep Pattern, Depression, Fearful and Frequent crying. Endocrine Not Present- Cold Intolerance, Excessive Hunger, Hair Changes, Heat Intolerance, Hot flashes and New Diabetes. Hematology Present- Gland problems. Not Present- Blood Thinners, Easy Bruising, Excessive bleeding, HIV and Persistent Infections.  Vitals Malachy Moan RMA; 10/15/2016 3:48 PM) 10/15/2016 3:47 PM Weight: 158 lb Height: 65in Body Surface Area: 1.79 m Body Mass Index: 26.29 kg/m  Temp.: 97.1F  Pulse: 77 (Regular)  BP: 100/60 (Sitting, Left Arm, Standard)      Physical Exam Rodman Key K. Stanely Sexson MD; 10/15/2016 4:28 PM)  The physical exam findings are as follows: Note:WDWN in NAD Eyes: Pupils equal, round; sclera anicteric HENT: Oral mucosa moist; good dentition Neck: No masses palpated, no thyromegaly Lungs: CTA bilaterally; normal respiratory effort CV: Regular rate and rhythm; no murmurs; extremities well-perfused with no edema Abd: +bowel sounds, soft, mild RUQ epigastric/ RUQ tenderness, no  palpable organomegaly; no palpable hernias Skin: Warm, dry; no sign of jaundice Psychiatric - alert and oriented x 4; calm mood and affect    Assessment & Plan Rodman Key K. 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 The surgical procedure has been discussed with the patient. Potential risks, benefits, alternative treatments, and expected outcomes have been explained. All of the patient's questions at this time have been answered. The likelihood of reaching the patient's treatment goal is  good. The patient understand the proposed surgical procedure and wishes to proceed.  Will keep patient overnight because of sleep apnea.  Krista Austin. Georgette Dover, MD, [facility]  General/ Trauma Surgery  10/15/2016 4:29 PM

## 2016-10-15 NOTE — Progress Notes (Signed)
Anesthesia Chart Review:  Pt is a same day work up  Pt is a 71 year old female scheduled for laparoscopic cholecystectomy with possible intraoperative cholangiogram on 10/16/2016 with Krista Mesa, MD  - PCP is Krista Austin. Krista Austin, [place] (notes in care everywhere)   PMH includes:  DM, hyperlipidemia, OSA thyroid nodule, pulmonary nodule, GERD. Current smoker. BMI 27.  - Hospitalized 6/5-6/18 for chest pain. Serial troponins negative, echo and stress test (results below) unremarkable. Determined to be non-cardiac chest pain.   Medications include: ASA 81 mg, lisinopril, Protonix, rosuvastatin  Labs from hospitalization 6/5-6/18 reviewed.  HbA1c 6.0, glucose 137. CMET and CBC acceptable for surgery.   Chest CT 10/07/16:  1. Small opacity described in the lower left lung on the recent chest radiograph correlates with mild pleural-parenchymal scarring in the lingula. 2. Solitary 6 mm basilar right upper lobe ground-glass pulmonary nodule. Initial follow-up with CT at 6-12 months is recommended to confirm persistence. If persistent, repeat CT is recommended every 2 years until 5 years of stability has been established.  3. Otherwise no active pulmonary disease . 4. Aortic atherosclerosis.  Three-vessel coronary atherosclerosis. 5. Peripherally calcified 2.3 cm right thyroid lobe nodule, for which thyroid ultrasound correlation is recommended.  CXR 10/06/16:  1. Mild interstitial prominence of both lungs likely reflecting the patient's smoking history. There is an area of confluent density adjacent to the left heart border laterally. This may reflect focal atelectasis or infiltrate or less likely a parenchymal nodule. This likely lies in the lower lobe. No evidence of CHF. PA and lateral chest X-ray is recommended in 3-4 weeks following trial of antibiotic therapy to ensure resolution and exclude underlying malignancy. 2. Thoracic aortic atherosclerosis.  EKG 10/07/16: NSR. Cannot rule out Anterior  infarct, age undetermined  Echo 10/07/16:  - Left ventricle: The cavity size was normal. Wall thickness was normal. Systolic function was normal. The estimated ejection fraction was in the range of 60% to 65%. Wall motion was normal; there were no regional wall motion abnormalities. Doppler parameters are consistent with abnormal left ventricular relaxation (grade 1 diastolic dysfunction). - Aortic valve: There was no stenosis. - Mitral valve: There was trivial regurgitation. - Right ventricle: The cavity size was normal. Systolic function was normal. - Pulmonary arteries: No complete TR doppler jet so unable to estimate PA systolic pressure. - Inferior vena cava: The vessel was normal in size. The respirophasic diameter changes were in the normal range (>= 50%), consistent with normal central venous pressure. - Impressions: Normal LV size with EF 60-65%. Normal RV size and systolic function. No significant valvular abnormalities.  Nuclear stress test 10/07/16:  1. No reversible ischemia or infarction. 2. Normal left ventricular wall motion. 3. Left ventricular ejection fraction 65% 4. Non invasive risk stratification: Low  Thyroid US 07/23/16 (care everywhere):  1) Multinodular goiter. No significant changes since previous study. The dominant right lobe nodule measures 3.1 cm in maximum diameter. 2) No significant cervical adenopathy.  Carotid duplex 08/26/15 (care everywhere): Right ECA and carotid bulb plaque. No significant stenosis.  If no changes, I anticipate pt can proceed with surgery as scheduled.   Krista Cass, FNP-BC [facility]/Anesthesiology Phone: [phone] 10/15/2016 9:41 AM

## 2016-10-16 ENCOUNTER — Ambulatory Visit (HOSPITAL_COMMUNITY): Payer: Medicare Other | Admitting: Emergency Medicine

## 2016-10-16 ENCOUNTER — Encounter (HOSPITAL_COMMUNITY): Payer: Self-pay | Admitting: *Deleted

## 2016-10-16 ENCOUNTER — Encounter (HOSPITAL_COMMUNITY)
Admission: RE | Disposition: A | Discharge status: Home / Self Care | Payer: Self-pay | Source: Ambulatory Visit | Attending: Surgery

## 2016-10-16 ENCOUNTER — Ambulatory Visit (HOSPITAL_COMMUNITY)
Admission: RE | Admit: 2016-10-16 | Discharge: 2016-10-17 | Disposition: A | Discharge status: Home / Self Care | Payer: Medicare Other | Source: Ambulatory Visit | Attending: Surgery | Admitting: Surgery

## 2016-10-16 DIAGNOSIS — K811 Chronic cholecystitis: Secondary | ICD-10-CM | POA: Diagnosis present

## 2016-10-16 DIAGNOSIS — Z79899 Other long term (current) drug therapy: Secondary | ICD-10-CM | POA: Diagnosis not present

## 2016-10-16 DIAGNOSIS — I999 Unspecified disorder of circulatory system: Secondary | ICD-10-CM | POA: Diagnosis not present

## 2016-10-16 DIAGNOSIS — N736 Female pelvic peritoneal adhesions (postinfective): Secondary | ICD-10-CM | POA: Diagnosis not present

## 2016-10-16 DIAGNOSIS — K219 Gastro-esophageal reflux disease without esophagitis: Secondary | ICD-10-CM | POA: Insufficient documentation

## 2016-10-16 DIAGNOSIS — Z8249 Family history of ischemic heart disease and other diseases of the circulatory system: Secondary | ICD-10-CM | POA: Insufficient documentation

## 2016-10-16 DIAGNOSIS — Z8261 Family history of arthritis: Secondary | ICD-10-CM | POA: Diagnosis not present

## 2016-10-16 DIAGNOSIS — Z820 Family history of epilepsy and other diseases of the nervous system: Secondary | ICD-10-CM | POA: Diagnosis not present

## 2016-10-16 DIAGNOSIS — Z818 Family history of other mental and behavioral disorders: Secondary | ICD-10-CM | POA: Insufficient documentation

## 2016-10-16 DIAGNOSIS — E042 Nontoxic multinodular goiter: Secondary | ICD-10-CM | POA: Insufficient documentation

## 2016-10-16 DIAGNOSIS — G4733 Obstructive sleep apnea (adult) (pediatric): Secondary | ICD-10-CM | POA: Diagnosis not present

## 2016-10-16 DIAGNOSIS — Z8371 Family history of colonic polyps: Secondary | ICD-10-CM | POA: Diagnosis not present

## 2016-10-16 DIAGNOSIS — E739 Lactose intolerance, unspecified: Secondary | ICD-10-CM | POA: Diagnosis not present

## 2016-10-16 DIAGNOSIS — F17200 Nicotine dependence, unspecified, uncomplicated: Secondary | ICD-10-CM | POA: Insufficient documentation

## 2016-10-16 DIAGNOSIS — I10 Essential (primary) hypertension: Secondary | ICD-10-CM | POA: Insufficient documentation

## 2016-10-16 DIAGNOSIS — Z8349 Family history of other endocrine, nutritional and metabolic diseases: Secondary | ICD-10-CM | POA: Insufficient documentation

## 2016-10-16 DIAGNOSIS — E785 Hyperlipidemia, unspecified: Secondary | ICD-10-CM | POA: Insufficient documentation

## 2016-10-16 DIAGNOSIS — K828 Other specified diseases of gallbladder: Secondary | ICD-10-CM | POA: Diagnosis not present

## 2016-10-16 DIAGNOSIS — Z91040 Latex allergy status: Secondary | ICD-10-CM | POA: Insufficient documentation

## 2016-10-16 HISTORY — DX: History of falling: Z91.81

## 2016-10-16 HISTORY — DX: Concussion with loss of consciousness status unknown, initial encounter: S06.0XAA

## 2016-10-16 HISTORY — DX: Dizziness and giddiness: R42

## 2016-10-16 HISTORY — PX: CHOLECYSTECTOMY: SHX55

## 2016-10-16 HISTORY — DX: Concussion with loss of consciousness of unspecified duration, initial encounter: S06.0X9A

## 2016-10-16 HISTORY — PX: LAPAROSCOPIC CHOLECYSTECTOMY: SUR755

## 2016-10-16 HISTORY — DX: Chronic cholecystitis: K81.1

## 2016-10-16 LAB — GLUCOSE, CAPILLARY
Glucose-Capillary: 113 mg/dL — ABNORMAL HIGH (ref 65–99)
Glucose-Capillary: 123 mg/dL — ABNORMAL HIGH (ref 65–99)
Glucose-Capillary: 119 mg/dL — ABNORMAL HIGH (ref 65–99)

## 2016-10-16 SURGERY — LAPAROSCOPIC CHOLECYSTECTOMY WITH INTRAOPERATIVE CHOLANGIOGRAM
Anesthesia: General | Site: Abdomen

## 2016-10-16 MED ORDER — ONDANSETRON HCL 4 MG/2ML IJ SOLN
INTRAMUSCULAR | Status: DC | PRN
  Administered 2016-10-16: 4 mg via INTRAVENOUS

## 2016-10-16 MED ORDER — SIMETHICONE 80 MG PO CHEW: 40.0000 mg | CHEWABLE_TABLET | Freq: Four times a day (QID) | ORAL | Status: DC | PRN

## 2016-10-16 MED ORDER — ONDANSETRON HCL 4 MG/2ML IJ SOLN: 4.0000 mg | Freq: Four times a day (QID) | INTRAMUSCULAR | Status: DC | PRN

## 2016-10-16 MED ORDER — DEXAMETHASONE SODIUM PHOSPHATE 10 MG/ML IJ SOLN
INTRAMUSCULAR | Status: AC
  Filled 2016-10-16: qty 1

## 2016-10-16 MED ORDER — ROCURONIUM BROMIDE 100 MG/10ML IV SOLN
INTRAVENOUS | Status: DC | PRN
  Administered 2016-10-16: 40 mg via INTRAVENOUS
  Administered 2016-10-16: 10 mg via INTRAVENOUS

## 2016-10-16 MED ORDER — KETOROLAC TROMETHAMINE 30 MG/ML IJ SOLN
INTRAMUSCULAR | Status: DC | PRN
  Administered 2016-10-16: 30 mg via INTRAVENOUS

## 2016-10-16 MED ORDER — DIPHENHYDRAMINE HCL 50 MG/ML IJ SOLN: 12.5000 mg | Freq: Four times a day (QID) | INTRAMUSCULAR | Status: DC | PRN

## 2016-10-16 MED ORDER — ENOXAPARIN SODIUM 40 MG/0.4ML ~~LOC~~ SOLN
40.0000 mg | SUBCUTANEOUS | Status: DC
  Administered 2016-10-17: 40 mg via SUBCUTANEOUS
  Filled 2016-10-16: qty 0.4

## 2016-10-16 MED ORDER — ROCURONIUM BROMIDE 10 MG/ML (PF) SYRINGE
PREFILLED_SYRINGE | INTRAVENOUS | Status: AC
  Filled 2016-10-16: qty 5

## 2016-10-16 MED ORDER — SUGAMMADEX SODIUM 200 MG/2ML IV SOLN
INTRAVENOUS | Status: AC
  Filled 2016-10-16: qty 2

## 2016-10-16 MED ORDER — ASPIRIN EC 81 MG PO TBEC
81.0000 mg | DELAYED_RELEASE_TABLET | Freq: Every day | ORAL | Status: DC
  Administered 2016-10-16: 81 mg via ORAL
  Filled 2016-10-16: qty 1

## 2016-10-16 MED ORDER — CEFAZOLIN SODIUM-DEXTROSE 2-4 GM/100ML-% IV SOLN
2.0000 g | Freq: Three times a day (TID) | INTRAVENOUS | Status: AC
  Administered 2016-10-16: 2 g via INTRAVENOUS
  Filled 2016-10-16: qty 100

## 2016-10-16 MED ORDER — CHLORHEXIDINE GLUCONATE CLOTH 2 % EX PADS: 6.0000 | MEDICATED_PAD | Freq: Once | CUTANEOUS | Status: DC

## 2016-10-16 MED ORDER — EPHEDRINE 5 MG/ML INJ
INTRAVENOUS | Status: AC
  Filled 2016-10-16: qty 10

## 2016-10-16 MED ORDER — PROMETHAZINE HCL 25 MG/ML IJ SOLN
6.2500 mg | INTRAMUSCULAR | Status: DC | PRN
  Administered 2016-10-16: 6.25 mg via INTRAVENOUS

## 2016-10-16 MED ORDER — LORATADINE 10 MG PO TABS
10.0000 mg | ORAL_TABLET | Freq: Every day | ORAL | Status: DC
  Administered 2016-10-16 – 2016-10-17 (×2): 10 mg via ORAL
  Filled 2016-10-16 (×2): qty 1

## 2016-10-16 MED ORDER — EPHEDRINE SULFATE-NACL 50-0.9 MG/10ML-% IV SOSY
PREFILLED_SYRINGE | INTRAVENOUS | Status: DC | PRN
  Administered 2016-10-16: 10 mg via INTRAVENOUS

## 2016-10-16 MED ORDER — PROPOFOL 10 MG/ML IV BOLUS
INTRAVENOUS | Status: AC
  Filled 2016-10-16: qty 20

## 2016-10-16 MED ORDER — SODIUM CHLORIDE 0.9 % IR SOLN
Status: DC | PRN
  Administered 2016-10-16: 1

## 2016-10-16 MED ORDER — KETOROLAC TROMETHAMINE 30 MG/ML IJ SOLN
30.0000 mg | Freq: Four times a day (QID) | INTRAMUSCULAR | Status: DC
  Administered 2016-10-16 – 2016-10-17 (×3): 30 mg via INTRAVENOUS
  Filled 2016-10-16 (×3): qty 1

## 2016-10-16 MED ORDER — KETOROLAC TROMETHAMINE 30 MG/ML IJ SOLN
INTRAMUSCULAR | Status: AC
  Filled 2016-10-16: qty 1

## 2016-10-16 MED ORDER — HYDROCODONE-ACETAMINOPHEN 5-325 MG PO TABS: 1.0000 | ORAL_TABLET | ORAL | Status: DC | PRN

## 2016-10-16 MED ORDER — FENTANYL CITRATE (PF) 100 MCG/2ML IJ SOLN: 50.0000 ug | INTRAMUSCULAR | Status: DC | PRN

## 2016-10-16 MED ORDER — HYDROMORPHONE HCL 1 MG/ML IJ SOLN
0.2500 mg | INTRAMUSCULAR | Status: DC | PRN
  Administered 2016-10-16 (×2): 0.25 mg via INTRAVENOUS

## 2016-10-16 MED ORDER — LIDOCAINE 2% (20 MG/ML) 5 ML SYRINGE
INTRAMUSCULAR | Status: DC | PRN
  Administered 2016-10-16: 100 mg via INTRAVENOUS

## 2016-10-16 MED ORDER — IBUPROFEN 600 MG PO TABS
600.0000 mg | ORAL_TABLET | Freq: Four times a day (QID) | ORAL | Status: DC | PRN
  Administered 2016-10-16 – 2016-10-17 (×2): 600 mg via ORAL
  Filled 2016-10-16 (×3): qty 1

## 2016-10-16 MED ORDER — DEXAMETHASONE SODIUM PHOSPHATE 10 MG/ML IJ SOLN
INTRAMUSCULAR | Status: DC | PRN
  Administered 2016-10-16: 10 mg via INTRAVENOUS

## 2016-10-16 MED ORDER — POLYVINYL ALCOHOL 1.4 % OP SOLN: 1.0000 [drp] | Freq: Three times a day (TID) | OPHTHALMIC | Status: DC | PRN

## 2016-10-16 MED ORDER — LIDOCAINE 2% (20 MG/ML) 5 ML SYRINGE
INTRAMUSCULAR | Status: AC
  Filled 2016-10-16: qty 5

## 2016-10-16 MED ORDER — OXYCODONE HCL 5 MG/5ML PO SOLN: 5.0000 mg | Freq: Once | ORAL | Status: DC | PRN

## 2016-10-16 MED ORDER — MIDAZOLAM HCL 2 MG/2ML IJ SOLN
INTRAMUSCULAR | Status: DC | PRN
  Administered 2016-10-16: 2 mg via INTRAVENOUS

## 2016-10-16 MED ORDER — FENTANYL CITRATE (PF) 250 MCG/5ML IJ SOLN
INTRAMUSCULAR | Status: AC
  Filled 2016-10-16: qty 5

## 2016-10-16 MED ORDER — FLUOXETINE HCL 20 MG PO CAPS
40.0000 mg | ORAL_CAPSULE | Freq: Every day | ORAL | Status: DC
  Administered 2016-10-16: 40 mg via ORAL
  Filled 2016-10-16: qty 2

## 2016-10-16 MED ORDER — ROSUVASTATIN CALCIUM 10 MG PO TABS
10.0000 mg | ORAL_TABLET | Freq: Every day | ORAL | Status: DC
  Administered 2016-10-16: 10 mg via ORAL
  Filled 2016-10-16: qty 1

## 2016-10-16 MED ORDER — LISINOPRIL 10 MG PO TABS
10.0000 mg | ORAL_TABLET | Freq: Every day | ORAL | Status: DC
  Administered 2016-10-17: 10 mg via ORAL
  Filled 2016-10-16: qty 1

## 2016-10-16 MED ORDER — ONDANSETRON 4 MG PO TBDP: 4.0000 mg | ORAL_TABLET | Freq: Four times a day (QID) | ORAL | Status: DC | PRN

## 2016-10-16 MED ORDER — PROMETHAZINE HCL 25 MG/ML IJ SOLN
INTRAMUSCULAR | Status: AC
  Administered 2016-10-16: 6.25 mg via INTRAVENOUS
  Filled 2016-10-16: qty 1

## 2016-10-16 MED ORDER — DIPHENHYDRAMINE HCL 12.5 MG/5ML PO ELIX: 12.5000 mg | ORAL_SOLUTION | Freq: Four times a day (QID) | ORAL | Status: DC | PRN

## 2016-10-16 MED ORDER — BUPROPION HCL ER (SR) 150 MG PO TB12
150.0000 mg | ORAL_TABLET | Freq: Two times a day (BID) | ORAL | Status: DC
  Administered 2016-10-16 – 2016-10-17 (×2): 150 mg via ORAL
  Filled 2016-10-16 (×2): qty 1

## 2016-10-16 MED ORDER — SODIUM CHLORIDE 0.9 % IV SOLN
INTRAVENOUS | Status: DC
  Administered 2016-10-16: 16:00:00 via INTRAVENOUS

## 2016-10-16 MED ORDER — SUGAMMADEX SODIUM 200 MG/2ML IV SOLN
INTRAVENOUS | Status: DC | PRN
  Administered 2016-10-16: 300 mg via INTRAVENOUS

## 2016-10-16 MED ORDER — HYDROMORPHONE HCL 1 MG/ML IJ SOLN
INTRAMUSCULAR | Status: AC
  Administered 2016-10-16: 0.25 mg via INTRAVENOUS
  Filled 2016-10-16: qty 0.5

## 2016-10-16 MED ORDER — MEPERIDINE HCL 25 MG/ML IJ SOLN: 6.2500 mg | INTRAMUSCULAR | Status: DC | PRN

## 2016-10-16 MED ORDER — LACTATED RINGERS IV SOLN
INTRAVENOUS | Status: DC
  Administered 2016-10-16 (×3): via INTRAVENOUS

## 2016-10-16 MED ORDER — IOPAMIDOL (ISOVUE-300) INJECTION 61%
INTRAVENOUS | Status: AC
  Filled 2016-10-16: qty 50

## 2016-10-16 MED ORDER — ONDANSETRON HCL 4 MG/2ML IJ SOLN
INTRAMUSCULAR | Status: AC
  Filled 2016-10-16: qty 2

## 2016-10-16 MED ORDER — OXYCODONE HCL 5 MG PO TABS: 5.0000 mg | ORAL_TABLET | Freq: Once | ORAL | Status: DC | PRN

## 2016-10-16 MED ORDER — BUPIVACAINE-EPINEPHRINE (PF) 0.25% -1:200000 IJ SOLN
INTRAMUSCULAR | Status: AC
  Filled 2016-10-16: qty 30

## 2016-10-16 MED ORDER — 0.9 % SODIUM CHLORIDE (POUR BTL) OPTIME
TOPICAL | Status: DC | PRN
  Administered 2016-10-16: 1000 mL

## 2016-10-16 MED ORDER — MIDAZOLAM HCL 2 MG/2ML IJ SOLN
INTRAMUSCULAR | Status: AC
  Filled 2016-10-16: qty 2

## 2016-10-16 MED ORDER — BUPIVACAINE-EPINEPHRINE 0.25% -1:200000 IJ SOLN
INTRAMUSCULAR | Status: DC | PRN
  Administered 2016-10-16: 10 mL

## 2016-10-16 MED ORDER — FENTANYL CITRATE (PF) 100 MCG/2ML IJ SOLN
INTRAMUSCULAR | Status: DC | PRN
  Administered 2016-10-16: 100 ug via INTRAVENOUS

## 2016-10-16 MED ORDER — PROPOFOL 10 MG/ML IV BOLUS
INTRAVENOUS | Status: DC | PRN
  Administered 2016-10-16: 150 mg via INTRAVENOUS

## 2016-10-16 MED ORDER — PANTOPRAZOLE SODIUM 40 MG PO TBEC
40.0000 mg | DELAYED_RELEASE_TABLET | Freq: Every day | ORAL | Status: DC
  Administered 2016-10-17: 40 mg via ORAL
  Filled 2016-10-16: qty 1

## 2016-10-16 SURGICAL SUPPLY — 40 items
APPLIER CLIP ROT 10 11.4 M/L (STAPLE) ×2
BENZOIN TINCTURE PRP APPL 2/3 (GAUZE/BANDAGES/DRESSINGS) ×2 IMPLANT
BLADE CLIPPER SURG (BLADE) ×2 IMPLANT
CANISTER SUCT 3000ML PPV (MISCELLANEOUS) ×2 IMPLANT
CHLORAPREP W/TINT 26ML (MISCELLANEOUS) ×2 IMPLANT
CLIP APPLIE ROT 10 11.4 M/L (STAPLE) ×1 IMPLANT
COVER MAYO STAND STRL (DRAPES) ×2 IMPLANT
COVER SURGICAL LIGHT HANDLE (MISCELLANEOUS) ×2 IMPLANT
DRAPE C-ARM 42X72 X-RAY (DRAPES) ×2 IMPLANT
DRSG TEGADERM 2-3/8X2-3/4 SM (GAUZE/BANDAGES/DRESSINGS) ×6 IMPLANT
DRSG TEGADERM 4X4.75 (GAUZE/BANDAGES/DRESSINGS) ×2 IMPLANT
ELECT REM PT RETURN 9FT ADLT (ELECTROSURGICAL) ×2
ELECTRODE REM PT RTRN 9FT ADLT (ELECTROSURGICAL) ×1 IMPLANT
FILTER SMOKE EVAC LAPAROSHD (FILTER) ×2 IMPLANT
GAUZE SPONGE 2X2 8PLY STRL LF (GAUZE/BANDAGES/DRESSINGS) ×1 IMPLANT
GLOVE BIO SURGEON STRL SZ7 (GLOVE) IMPLANT
GLOVE BIOGEL PI IND STRL 7.5 (GLOVE) ×1 IMPLANT
GLOVE BIOGEL PI INDICATOR 7.5 (GLOVE) ×1
GOWN STRL REUS W/ TWL LRG LVL3 (GOWN DISPOSABLE) ×3 IMPLANT
GOWN STRL REUS W/TWL LRG LVL3 (GOWN DISPOSABLE) ×3
KIT BASIN OR (CUSTOM PROCEDURE TRAY) ×2 IMPLANT
KIT ROOM TURNOVER OR (KITS) ×2 IMPLANT
NS IRRIG 1000ML POUR BTL (IV SOLUTION) ×2 IMPLANT
PAD ARMBOARD 7.5X6 YLW CONV (MISCELLANEOUS) ×2 IMPLANT
POUCH SPECIMEN RETRIEVAL 10MM (ENDOMECHANICALS) ×2 IMPLANT
SCISSORS LAP 5X35 DISP (ENDOMECHANICALS) ×2 IMPLANT
SET CHOLANGIOGRAPH 5 50 .035 (SET/KITS/TRAYS/PACK) ×2 IMPLANT
SET IRRIG TUBING LAPAROSCOPIC (IRRIGATION / IRRIGATOR) ×2 IMPLANT
SLEEVE ENDOPATH XCEL 5M (ENDOMECHANICALS) ×2 IMPLANT
SPECIMEN JAR SMALL (MISCELLANEOUS) ×2 IMPLANT
SPONGE GAUZE 2X2 STER 10/PKG (GAUZE/BANDAGES/DRESSINGS) ×1
STRIP CLOSURE SKIN 1/2X4 (GAUZE/BANDAGES/DRESSINGS) ×2 IMPLANT
SUT MNCRL AB 4-0 PS2 18 (SUTURE) ×2 IMPLANT
TOWEL OR 17X24 6PK STRL BLUE (TOWEL DISPOSABLE) ×2 IMPLANT
TOWEL OR 17X26 10 PK STRL BLUE (TOWEL DISPOSABLE) ×2 IMPLANT
TRAY LAPAROSCOPIC MC (CUSTOM PROCEDURE TRAY) ×2 IMPLANT
TROCAR XCEL BLUNT TIP 100MML (ENDOMECHANICALS) ×2 IMPLANT
TROCAR XCEL NON-BLD 11X100MML (ENDOMECHANICALS) ×2 IMPLANT
TROCAR XCEL NON-BLD 5MMX100MML (ENDOMECHANICALS) ×2 IMPLANT
TUBING INSUFFLATION (TUBING) ×2 IMPLANT

## 2016-10-16 NOTE — H&P (View-Only) (Signed)
History of Present Illness Krista Austin. Krista Danis MD; 10/15/2016 4:27 PM) The patient is a 71 year old female who presents with abdominal pain. PCP - [name] Referring - Dr. Juanita Craver Reason: gallbladder symptoms  This is a 71 yo female with PMH significant for OSA, hypertension, and dyslipidemia who presents with a 5-6 week history of epigastric pain radiating through to her back. These intermittent symptoms are exacerbated by eating greasy foods. These attacks are accompanied by nausea as well as diarrhea. The patient has lost about 10 pounds in the last 8 weeks because of decreased by mouth intake especially of red meat. She was referred for evaluation by GI. An ultrasound was unremarkable. A HIDA scan was positive for biliary dyskinesia. The patient has a constant epigastric discomfort with intermittent severe attacks.  Her only abdominal surgery was a tubal ligation.  CLINICAL DATA: Right upper quadrant pain for 2-3 weeks  EXAM: ABDOMEN ULTRASOUND COMPLETE  COMPARISON: None.  FINDINGS: Gallbladder: No gallstones or wall thickening visualized. No sonographic Murphy sign noted by sonographer.  Common bile duct: Diameter: 4.5 mm  Liver: No focal lesion identified. Increased hepatic parenchymal echogenicity (image 56).  IVC: Limited visualization secondary to overlying bowel gas.  Pancreas: Sir bowel gas  Spleen: Size and appearance within normal limits.  Right Kidney: Length: 10.9 cm. Echogenicity within normal limits. No mass or hydronephrosis visualized.  Left Kidney: Length: 11.4 cm. Echogenicity within normal limits. No mass or hydronephrosis visualized.  Abdominal aorta: No aneurysm visualized.  Other findings: None.  IMPRESSION: No cholelithiasis or sonographic evidence of acute cholecystitis.  Mild increased hepatic echogenicity as can be seen with hepatic steatosis.   Electronically Signed By: Kathreen Devoid On: 10/02/2016 09:15  CLINICAL  DATA: RIGHT upper quadrant pain and nausea for 3 weeks  EXAM: NUCLEAR MEDICINE HEPATOBILIARY IMAGING WITH GALLBLADDER EF  TECHNIQUE: Sequential images of the abdomen were obtained out to 60 minutes following intravenous administration of radiopharmaceutical. After oral ingestion of Ensure, gallbladder ejection fraction was determined. At 60 min, normal ejection fraction is greater than 33%.  RADIOPHARMACEUTICALS: 5.5 mCi Tc-26m Choletec IV  COMPARISON: None  FINDINGS: Normal tracer extraction from bloodstream indicating normal hepatocellular function.  Normal excretion of tracer into biliary tree.  Gallbladder visualized at 49 min.  Small bowel visualized at 16 min.  No hepatic retention of tracer.  Mild patient motion artifacts degrade post Ensure imaging.  Subjectively, mildly decreased emptying of tracer from gallbladder following fatty meal stimulation.  Calculated gallbladder ejection fraction is 30%, below the normal range.  Patient reported no symptoms following Ensure ingestion.  Normal gallbladder ejection fraction following Ensure ingestion is greater than 33% at 1 hour.  IMPRESSION: Patent biliary tree.  Slightly decreased gallbladder ejection fraction of 30% following fatty meal stimulation.   Electronically Signed By: Lavonia Dana M.D. On: 10/02/2016 16:08    Past Surgical History Malachy Moan, [place]; 10/15/2016 3:39 PM) Cataract Surgery Bilateral. Spinal Surgery - Lower Back Spinal Surgery - Neck Tonsillectomy  Diagnostic Studies History Malachy Moan, [place]; 10/15/2016 3:39 PM) Colonoscopy 1-5 years ago Mammogram within last year  Allergies Malachy Moan, RMA; 10/15/2016 3:43 PM) Latex Lactose Intolerance *DIGESTIVE AIDS*  Medication History Malachy Moan, RMA; 10/15/2016 3:47 PM) Rosuvastatin Calcium (10MG  Tablet, Oral at bedtime) Active. FLUoxetine HCl (40MG  Capsule, Oral at bedtime) Active. Pantoprazole  Sodium (40MG  Tablet DR, Oral at bedtime) Active. Lisinopril (10MG  Tablet, Oral at bedtime) Active. Citracal +D3 (Oral at bedtime) Specific strength unknown - Active. Daily Multiple Vitamin (Oral at bedtime) Active.  ZyrTEC (10MG  Tablet, Oral at bedtime) Active. Omega-3 (Oral) Specific strength unknown - Active. BuPROPion HCl ER (SR) (150MG  Tablet ER 12HR, Oral) Active. ALPRAZolam (0.5MG  Tablet, Oral three times daily as needed) Active. Advil (200MG  Tablet, 2 tabs Oral as needed) Active. Medications Reconciled  Social History Malachy Moan, [place]; 10/15/2016 3:39 PM) Caffeine use Tea. No alcohol use No drug use  Family History Malachy Moan, [place]; 10/15/2016 3:39 PM) Anesthetic complications Daughter. Arthritis Father, Mother, Sister, Son. Colon Polyps Daughter. Depression Father, Mother. Heart Disease Father, Mother. Migraine Headache Daughter. Thyroid problems Sister.  Pregnancy / Birth History Malachy Moan, [place]; 10/15/2016 3:39 PM) Age at menarche 60 years. Age of menopause 66-50 Contraceptive History Oral contraceptives. Gravida 2 Length (months) of breastfeeding 3-6 Maternal age 77-30 Para 3  Other Problems Malachy Moan, [place]; 10/15/2016 3:39 PM) Arthritis Back Pain Sleep Apnea Thyroid Disease Vascular Disease     Review of Systems Malachy Moan RMA; 10/15/2016 3:39 PM) General Not Present- Appetite Loss, Chills, Fatigue, Fever, Night Sweats, Weight Gain and Weight Loss. Skin Not Present- Change in Wart/Mole, Dryness, Hives, Jaundice, New Lesions, Non-Healing Wounds, Rash and Ulcer. HEENT Present- Wears glasses/contact lenses. Not Present- Earache, Hearing Loss, Hoarseness, Nose Bleed, Oral Ulcers, Ringing in the Ears, Seasonal Allergies, Sinus Pain, Sore Throat, Visual Disturbances and Yellow Eyes. Respiratory Not Present- Bloody sputum, Chronic Cough, Difficulty Breathing, Snoring and Wheezing. Breast Not Present-  Breast Mass, Breast Pain, Nipple Discharge and Skin Changes. Cardiovascular Not Present- Chest Pain, Difficulty Breathing Lying Down, Leg Cramps, Palpitations, Rapid Heart Rate, Shortness of Breath and Swelling of Extremities. Gastrointestinal Present- Abdominal Pain and Nausea. Not Present- Bloating, Bloody Stool, Change in Bowel Habits, Chronic diarrhea, Constipation, Difficulty Swallowing, Excessive gas, Gets full quickly at meals, Hemorrhoids, Indigestion, Rectal Pain and Vomiting. Female Genitourinary Present- Frequency. Not Present- Nocturia, Painful Urination, Pelvic Pain and Urgency. Musculoskeletal Present- Back Pain. Not Present- Joint Pain, Joint Stiffness, Muscle Pain, Muscle Weakness and Swelling of Extremities. Neurological Not Present- Decreased Memory, Fainting, Headaches, Numbness, Seizures, Tingling, Tremor, Trouble walking and Weakness. Psychiatric Not Present- Anxiety, Bipolar, Change in Sleep Pattern, Depression, Fearful and Frequent crying. Endocrine Not Present- Cold Intolerance, Excessive Hunger, Hair Changes, Heat Intolerance, Hot flashes and New Diabetes. Hematology Present- Gland problems. Not Present- Blood Thinners, Easy Bruising, Excessive bleeding, HIV and Persistent Infections.  Vitals Malachy Moan RMA; 10/15/2016 3:48 PM) 10/15/2016 3:47 PM Weight: 158 lb Height: 65in Body Surface Area: 1.79 m Body Mass Index: 26.29 kg/m  Temp.: 97.58F  Pulse: 77 (Regular)  BP: 100/60 (Sitting, Left Arm, Standard)      Physical Exam Rodman Key K. Elisabel Hanover MD; 10/15/2016 4:28 PM)  The physical exam findings are as follows: Note:WDWN in NAD Eyes: Pupils equal, round; sclera anicteric HENT: Oral mucosa moist; good dentition Neck: No masses palpated, no thyromegaly Lungs: CTA bilaterally; normal respiratory effort CV: Regular rate and rhythm; no murmurs; extremities well-perfused with no edema Abd: +bowel sounds, soft, mild RUQ epigastric/ RUQ tenderness, no  palpable organomegaly; no palpable hernias Skin: Warm, dry; no sign of jaundice Psychiatric - alert and oriented x 4; calm mood and affect    Assessment & Plan Rodman Key K. Devarious Pavek MD; 10/15/2016 4:28 PM)  CHRONIC CHOLECYSTITIS (K81.1)  Current Plans Schedule for Surgery - Laparoscopic cholecystectomy with intraoperative cholangiogram. The surgical procedure has been discussed with the patient. Potential risks, benefits, alternative treatments, and expected outcomes have been explained. All of the patient's questions at this time have been answered. The likelihood of reaching the patient's treatment goal is  good. The patient understand the proposed surgical procedure and wishes to proceed.  Will keep patient overnight because of sleep apnea.  Krista Austin. Georgette Dover, MD, [facility] Surgery  General/ Trauma Surgery  10/15/2016 4:29 PM

## 2016-10-16 NOTE — Anesthesia Procedure Notes (Signed)
Procedure Name: Intubation Date/Time: 10/16/2016 12:42 PM Performed by: Myna Bright Pre-anesthesia Checklist: Patient identified, Emergency Drugs available, Suction available and Patient being monitored Patient Re-evaluated:Patient Re-evaluated prior to inductionOxygen Delivery Method: Circle System Utilized Preoxygenation: Pre-oxygenation with 100% oxygen Intubation Type: IV induction Ventilation: Mask ventilation without difficulty Laryngoscope Size: Mac and 3 Grade View: Grade III Tube type: Oral Tube size: 7.0 mm Number of attempts: 1 Airway Equipment and Method: Stylet and Oral airway Placement Confirmation: ETT inserted through vocal cords under direct vision,  positive ETCO2 and breath sounds checked- equal and bilateral Secured at: 21 cm Tube secured with: Tape Dental Injury: Teeth and Oropharynx as per pre-operative assessment

## 2016-10-16 NOTE — Progress Notes (Signed)
Received pt from PACU, A&O x4, 4 lap sites to abd with dressing dry and intact. Mild soreness per pt, do not want pain meds at this time.

## 2016-10-16 NOTE — Op Note (Signed)
Laparoscopic Cholecystectomy Procedure Note  Indications: This patient presents with symptomatic gallbladder disease and will undergo laparoscopic cholecystectomy.  HIDA scan confirmed decreased gallbladder ejection fraction.  Pre-operative Diagnosis: Chronic cholecystitis  Post-operative Diagnosis: Same  Surgeon: Verlia Kaney K.   Assistants: none  Anesthesia: General endotracheal anesthesia  ASA Class: 2  Procedure Details  The patient was seen again in the Holding Room. The risks, benefits, complications, treatment options, and expected outcomes were discussed with the patient. The possibilities of reaction to medication, pulmonary aspiration, perforation of viscus, bleeding, recurrent infection, finding a normal gallbladder, the need for additional procedures, failure to diagnose a condition, the possible need to convert to an open procedure, and creating a complication requiring transfusion or operation were discussed with the patient. The likelihood of improving the patient's symptoms with return to their baseline status is good.  The patient and/or family concurred with the proposed plan, giving informed consent. The site of surgery properly noted. The patient was taken to Operating Room, identified as TEHILLAH CIPRIANI and the procedure verified as Laparoscopic Cholecystectomy with Intraoperative Cholangiogram. A Time Out was held and the above information confirmed.  Prior to the induction of general anesthesia, antibiotic prophylaxis was administered. General endotracheal anesthesia was then administered and tolerated well. After the induction, the abdomen was prepped with Chloraprep and draped in sterile fashion. The patient was positioned in the supine position.  Local anesthetic agent was injected into the skin near the umbilicus and an incision made. We dissected down to the abdominal fascia with blunt dissection.  The fascia was incised vertically and we entered the peritoneal  cavity bluntly.  A pursestring suture of 0-Vicryl was placed around the fascial opening.  The Hasson cannula was inserted and secured with the stay suture.  Pneumoperitoneum was then created with CO2 and tolerated well without any adverse changes in the patient's vital signs. An 11-mm port was placed in the subxiphoid position.  Two 5-mm ports were placed in the right upper quadrant. All skin incisions were infiltrated with a local anesthetic agent before making the incision and placing the trocars.   We positioned the patient in reverse Trendelenburg, tilted slightly to the patient's left.  The gallbladder was identified, the fundus grasped and retracted cephalad. Adhesions were lysed bluntly and with the electrocautery where indicated, taking care not to injure any adjacent organs or viscus. The infundibulum was grasped and retracted laterally, exposing the peritoneum overlying the triangle of Calot. This was then divided and exposed in a blunt fashion. The cystic duct was clearly identified and bluntly dissected circumferentially.  The cystic duct is very diminutive and is smaller than a cholangiogram catheter, so we decided against an intraoperative cholangiogram. A critical view of the cystic duct and cystic artery was obtained.  The cystic duct was then ligated with clips and divided. The cystic artery was dissected free, ligated with clips and divided as well.   The gallbladder was dissected from the liver bed in retrograde fashion with the electrocautery. The gallbladder was removed and placed in an Endocatch sac. The liver bed was irrigated and inspected. Hemostasis was achieved with the electrocautery. Copious irrigation was utilized and was repeatedly aspirated until clear.  The gallbladder and Endocatch sac were then removed through the umbilical port site.  The pursestring suture was used to close the umbilical fascia.    We again inspected the right upper quadrant for hemostasis.   Pneumoperitoneum was released as we removed the trocars.  4-0 Monocryl was used to close  the skin.   Benzoin, steri-strips, and clean dressings were applied. The patient was then extubated and brought to the recovery room in stable condition. Instrument, sponge, and needle counts were correct at closure and at the conclusion of the case.   Findings: Cholecystitis without Cholelithiasis  Estimated Blood Loss: Minimal         Drains: none         Specimens: Gallbladder           Complications: None; patient tolerated the procedure well.         Disposition: PACU - hemodynamically stable.         Condition: stable  Imogene Burn. Georgette Dover, MD, [facility] Surgery  General/ Trauma Surgery  10/16/2016 1:30 PM

## 2016-10-16 NOTE — Anesthesia Preprocedure Evaluation (Addendum)
Anesthesia Evaluation  Patient identified by MRN, date of birth, ID band Patient awake    Reviewed: Allergy & Precautions, NPO status , Patient's Chart, lab work & pertinent test results  History of Anesthesia Complications (+) Family history of anesthesia reaction and history of anesthetic complications  Airway Mallampati: II  TM Distance: >3 FB Neck ROM: Full    Dental no notable dental hx.    Pulmonary sleep apnea , pneumonia, Current Smoker,    Pulmonary exam normal breath sounds clear to auscultation       Cardiovascular negative cardio ROS Normal cardiovascular exam Rhythm:Regular Rate:Normal  Echo 10/07/2016 - Left ventricle: The cavity size was normal. Wall thickness was normal. Systolic function was normal. The estimated ejection fraction was in the range of 60% to 65%. Wall motion was normal; there were no regional wall motion abnormalities. Doppler parameters are consistent with abnormal left ventricular relaxation (grade 1 diastolic dysfunction). - Aortic valve: There was no stenosis. - Mitral valve: There was trivial regurgitation. - Right ventricle: The cavity size was normal. Systolic function was normal. - Pulmonary arteries: No complete TR doppler jet so unable to estimate PA systolic pressure. - Inferior vena cava: The vessel was normal in size. The   respirophasic diameter changes were in the normal range (>= 50%), consistent with normal central venous pressure.  Impressions: - Normal LV size with EF 60-65%. Normal RV size and systolic function. No significant valvular abnormalities.   Neuro/Psych PSYCHIATRIC DISORDERS Depression negative neurological ROS     GI/Hepatic Neg liver ROS, GERD  ,  Endo/Other  negative endocrine ROSdiabetes  Renal/GU negative Renal ROS     Musculoskeletal  (+) Arthritis ,   Abdominal   Peds  Hematology negative hematology ROS (+)   Anesthesia Other Findings   Reproductive/Obstetrics negative OB ROS                            Anesthesia Physical Anesthesia Plan  ASA: II  Anesthesia Plan: General   Post-op Pain Management:    Induction: Intravenous  PONV Risk Score and Plan: 3 and Ondansetron, Dexamethasone, Propofol, Midazolam and Treatment may vary due to age or medical condition  Airway Management Planned: Oral ETT  Additional Equipment:   Intra-op Plan:   Post-operative Plan: Extubation in OR  Informed Consent: I have reviewed the patients History and Physical, chart, labs and discussed the procedure including the risks, benefits and alternatives for the proposed anesthesia with the patient or authorized representative who has indicated his/her understanding and acceptance.   Dental advisory given  Plan Discussed with: CRNA  Anesthesia Plan Comments:         Anesthesia Quick Evaluation

## 2016-10-16 NOTE — Transfer of Care (Signed)
Immediate Anesthesia Transfer of Care Note  Patient: ALEZANDRA EGLI  Procedure(s) Performed: Procedure(s): LAPAROSCOPIC CHOLECYSTECTOMY (N/A)  Patient Location: PACU  Anesthesia Type:General  Level of Consciousness: awake, alert  and oriented  Airway & Oxygen Therapy: Patient Spontanous Breathing and Patient connected to nasal cannula oxygen  Post-op Assessment: Report given to RN, Post -op Vital signs reviewed and stable and Patient moving all extremities  Post vital signs: Reviewed and stable  Last Vitals:  Vitals:   10/16/16 1343 10/16/16 1344  BP:  126/72  Pulse:  78  Resp:  14  Temp: 36.4 C     Last Pain:  Vitals:   10/16/16 1343  TempSrc:   PainSc: 0-No pain      Patients Stated Pain Goal: 2 (28/20/60 1561)  Complications: No apparent anesthesia complications

## 2016-10-16 NOTE — Interval H&P Note (Signed)
History and Physical Interval Note:  10/16/2016 10:39 AM  Krista Austin  has presented today for surgery, with the diagnosis of Chronic cholecystitis  The various methods of treatment have been discussed with the patient and family. After consideration of risks, benefits and other options for treatment, the patient has consented to  Procedure(s): LAPAROSCOPIC CHOLECYSTECTOMY WITH POSSIBLE INTRAOPERATIVE CHOLANGIOGRAM (N/A) as a surgical intervention .  The patient's history has been reviewed, patient examined, no change in status, stable for surgery.  I have reviewed the patient's chart and labs.  Questions were answered to the patient's satisfaction.     [name]

## 2016-10-17 DIAGNOSIS — K811 Chronic cholecystitis: Secondary | ICD-10-CM | POA: Diagnosis not present

## 2016-10-17 MED ORDER — HYDROCODONE-ACETAMINOPHEN 5-325 MG PO TABS: 1.0000 | ORAL_TABLET | ORAL | 0 refills | Status: DC | PRN

## 2016-10-17 NOTE — Discharge Instructions (Signed)
[facility] ______[facility] °LAPAROSCOPIC SURGERY: POST OP INSTRUCTIONS °Always review your discharge instruction sheet given to you by the facility where your surgery was performed. °IF YOU HAVE DISABILITY OR FAMILY LEAVE FORMS, YOU MUST BRING THEM TO THE OFFICE FOR PROCESSING.   °DO NOT GIVE THEM TO YOUR DOCTOR. ° °1. A prescription for pain medication may be given to you upon discharge.  Take your pain medication as prescribed, if needed.  If narcotic pain medicine is not needed, then you may take acetaminophen (Tylenol) or ibuprofen (Advil) as needed. °2. Take your usually prescribed medications unless otherwise directed. °3. If you need a refill on your pain medication, please contact your pharmacy.  They will contact our office to request authorization. Prescriptions will not be filled after 5pm or on week-ends. °4. You should follow a light diet the first few days after arrival home, such as soup and crackers, etc.  Be sure to include lots of fluids daily. °5. Most patients will experience some swelling and bruising in the area of the incisions.  Ice packs will help.  Swelling and bruising can take several days to resolve.  °6. It is common to experience some constipation if taking pain medication after surgery.  Increasing fluid intake and taking a stool softener (such as Colace) will usually help or prevent this problem from occurring.  A mild laxative (Milk of Magnesia or Miralax) should be taken according to package instructions if there are no bowel movements after 48 hours. °7. Unless discharge instructions indicate otherwise, you may remove your bandages 24-48 hours after surgery, and you may shower at that time.  You may have steri-strips (small skin tapes) in place directly over the incision.  These strips should be left on the skin for 7-10 days.  If your surgeon used skin glue on the incision, you may shower in 24 hours.  The glue will flake off over the next 2-3 weeks.  Any sutures or  staples will be removed at the office during your follow-up visit. °8. ACTIVITIES:  You may resume regular (light) daily activities beginning the next day--such as daily self-care, walking, climbing stairs--gradually increasing activities as tolerated.  You may have sexual intercourse when it is comfortable.  Refrain from any heavy lifting or straining until approved by your doctor. °a. You may drive when you are no longer taking prescription pain medication, you can comfortably wear a seatbelt, and you can safely maneuver your car and apply brakes. °b. RETURN TO WORK:  __________________________________________________________ °9. You should see your doctor in the office for a follow-up appointment approximately 2-3 weeks after your surgery.  Make sure that you call for this appointment within a day or two after you arrive home to insure a convenient appointment time. °10. OTHER INSTRUCTIONS: __________________________________________________________________________________________________________________________ __________________________________________________________________________________________________________________________ °WHEN TO CALL YOUR DOCTOR: °1. Fever over 101.0 °2. Inability to urinate °3. Continued bleeding from incision. °4. Increased pain, redness, or drainage from the incision. °5. Increasing abdominal pain ° °The clinic staff is available to answer your questions during regular business hours.  Please don’t hesitate to call and ask to speak to one of the nurses for clinical concerns.  If you have a medical emergency, go to the nearest emergency room or call 911.  A surgeon from [facility] is always on call at the hospital. °[address], [address], [place], [place]  [address] ? [address] [address], [place], [place]   [address] °[phone] ? [phone] ? FAX [phone] °Web site:   [email or web address] °

## 2016-10-17 NOTE — Discharge Summary (Signed)
Physician Discharge Summary  Patient ID: AAVA DELAND MRN: [id number] DOB/AGE: [id number] 69 y.o.  Admit date: 10/16/2016 Discharge date: 10/17/2016  Admission Diagnoses: Patient Active Problem List   Diagnosis Date Noted  . Chronic cholecystitis 10/16/2016  . Dyslipidemia 10/07/2016  . Abnormal CXR   . Chest pain 10/06/2016     Discharge Diagnoses:  Active Problems:   Chronic cholecystitis OSA  Discharged Condition: good  Hospital Course:  Pt was admitted to the floor following lap chole by Dr. Georgette Dover 6/15.  She did well overnight with minimal pain.  She has ambulated independently.  She has tolerated diet without n/v.  She feels good.  She is ready for d/c.    Consults: None  Significant Diagnostic Studies: n/a  Treatments: surgery: see above  Discharge Exam: Blood pressure 110/69, pulse 69, temperature 98.1 F (36.7 C), temperature source Oral, resp. rate 18, height 5\' 5"  (1.651 m), weight 72.6 kg (160 lb), SpO2 98 %. General appearance: alert, cooperative and no distress Resp: breathing comfortably GI: soft, non distended, non tender, dressings c/d/i. Extremities: extremities normal, atraumatic, no cyanosis or edema  Disposition: 01-Home or Self Care  Discharge Instructions    Call MD for:  difficulty breathing, headache or visual disturbances    Complete by:  As directed    Call MD for:  persistant dizziness or light-headedness    Complete by:  As directed    Call MD for:  persistant nausea and vomiting    Complete by:  As directed    Call MD for:  redness, tenderness, or signs of infection (pain, swelling, redness, odor or green/yellow discharge around incision site)    Complete by:  As directed    Call MD for:  severe uncontrolled pain    Complete by:  As directed    Call MD for:  temperature >100.4    Complete by:  As directed    Diet - low sodium heart healthy    Complete by:  As directed    Increase activity slowly    Complete by:  As directed       Allergies as of 10/17/2016      Reactions   Lactose Intolerance (gi) Diarrhea   Latex Rash      Medication List    TAKE these medications   ALPRAZolam 0.5 MG tablet Commonly known as:  XANAX Take 0.5 mg by mouth 3 (three) times daily as needed for anxiety.   aspirin EC 81 MG tablet Take 81 mg by mouth at bedtime.   buPROPion 150 MG 12 hr tablet Commonly known as:  WELLBUTRIN SR Take 150 mg by mouth See admin instructions. Take 1 tablet (150 mg) by mouth once daily x3 days then increase to twice daily   cetirizine 10 MG tablet Commonly known as:  ZYRTEC Take 10 mg by mouth at bedtime.   CITRACAL +D3 PO Take 1 tablet by mouth at bedtime.   FLUoxetine 40 MG capsule Commonly known as:  PROZAC Take 40 mg by mouth at bedtime.   HYDROcodone-acetaminophen 5-325 MG tablet Commonly known as:  NORCO/VICODIN Take 1-2 tablets by mouth every 4 (four) hours as needed for moderate pain.   hydrocortisone cream 1 % Apply 1 application topically daily as needed for itching.   ibuprofen 200 MG tablet Commonly known as:  ADVIL,MOTRIN Take 400-800 mg by mouth every 8 (eight) hours as needed (for pain.).   lisinopril 10 MG tablet Commonly known as:  PRINIVIL,ZESTRIL Take 10 mg by mouth daily.  multivitamin with minerals tablet Take 1 tablet by mouth at bedtime.   OMEGA-3 PO Take 540 mg by mouth at bedtime.   pantoprazole 40 MG tablet Commonly known as:  PROTONIX Take 40 mg by mouth daily.   rosuvastatin 10 MG tablet Commonly known as:  CRESTOR Take 10 mg by mouth at bedtime. Reported on 09/18/2015   SYSTANE 0.4-0.3 % Soln Generic drug:  Polyethyl Glycol-Propyl Glycol Place 1 drop into both eyes 3 (three) times daily as needed (for dry/irritated eyes).      Follow-up Information    [name], MD. Schedule an appointment as soon as possible for a visit in 3 week(s).   Specialty:  General Surgery Contact information: [address] [place]  [id number] [phone]           Signed: Stark Klein 10/17/2016, 10:03 AM

## 2016-10-17 NOTE — Progress Notes (Signed)
Patient discharged to home with instructions and prescription. 

## 2016-10-19 ENCOUNTER — Encounter (HOSPITAL_COMMUNITY): Payer: Self-pay | Admitting: Surgery

## 2016-10-19 NOTE — Anesthesia Postprocedure Evaluation (Signed)
Anesthesia Post Note  Patient: JENNIFER PAYES  Procedure(s) Performed: Procedure(s) (LRB): LAPAROSCOPIC CHOLECYSTECTOMY (N/A)     Patient location during evaluation: PACU Anesthesia Type: General Level of consciousness: awake and alert Pain management: pain level controlled Vital Signs Assessment: post-procedure vital signs reviewed and stable Respiratory status: spontaneous breathing, nonlabored ventilation, respiratory function stable and patient connected to nasal cannula oxygen Cardiovascular status: blood pressure returned to baseline and stable Postop Assessment: no signs of nausea or vomiting Anesthetic complications: no    Last Vitals:  Vitals:   10/17/16 0159 10/17/16 0602  BP: (!) 110/56 110/69  Pulse: 67 69  Resp: 16 18  Temp: 36.8 C 36.7 C    Last Pain:  Vitals:   10/17/16 0859  TempSrc:   PainSc: 0-No pain                 Arabell Neria

## 2016-10-28 ENCOUNTER — Telehealth: Payer: Self-pay | Admitting: Cardiovascular Disease

## 2016-10-28 NOTE — Telephone Encounter (Signed)
New message     Pt is requesting to switch from Dr Johnsie Cancel and Dr Marlou Porch her son is a pt of [facility] and she would like the same doctor

## 2016-10-29 NOTE — Telephone Encounter (Signed)
That's fine

## 2016-11-03 NOTE — Telephone Encounter (Signed)
Spoke with pt, aware to follow up as needed. Pt agreed with this plan.

## 2016-11-03 NOTE — Telephone Encounter (Signed)
In review of chart, she does not need cardiology follow up. NUC stress low risk. She may see me back on as needed basis. Thanks  Candee Furbish, MD

## 2016-11-10 ENCOUNTER — Telehealth: Payer: Self-pay

## 2016-11-10 ENCOUNTER — Encounter: Payer: Self-pay | Admitting: Thoracic Surgery (Cardiothoracic Vascular Surgery)

## 2016-11-10 ENCOUNTER — Institutional Professional Consult (permissible substitution) (INDEPENDENT_AMBULATORY_CARE_PROVIDER_SITE_OTHER): Payer: Medicare Other | Admitting: Thoracic Surgery (Cardiothoracic Vascular Surgery)

## 2016-11-10 VITALS — BP 99/62 | HR 76 | Resp 20 | Ht 65.0 in | Wt 155.0 lb

## 2016-11-10 DIAGNOSIS — R911 Solitary pulmonary nodule: Secondary | ICD-10-CM | POA: Diagnosis not present

## 2016-11-10 NOTE — Progress Notes (Signed)
PCP is Blair Heys, PA-C Referring Provider is Blair Heys, [place]  Chief Complaint  Patient presents with  . Lung Lesion    Surgical eval, Chest CT 10/07/2016    HPI: Ms. Iannacone is 71 year old woman sent for consultation regarding a right lung groundglass opacity.  Ms. Mamone is a 71 year old woman with a 50-pack-year history of smoking (quit 10/19/2016), type 2 diabetes, thyroid nodules, hyperlipidemia, obstructive sleep apnea requiring CPAP, arthritis, chronic back pain, reflux, and depression. She recently was seen in the emergency room with chest pain. As part of her evaluation she had a chest x-ray which showed a possible left lung nodule. A CT of the chest showed this was likely pleural parenchymal scarring that there was a 6 mm groundglass opacity in the right upper lobe.  She had a cholecystectomy on 10/16/2016. She did well with that has lost about 15 pounds since the surgery. She has not had any cough or shortness of breath. She has not had any chest pain since her emergency room evaluation. A cardiac workup at that time was negative.  Zubrod Score: At the time of surgery this patient's most appropriate activity status/level should be described as: [x]     0    Normal activity, no symptoms []     1    Restricted in physical strenuous activity but ambulatory, able to do out light work []     2    Ambulatory and capable of self care, unable to do work activities, up and about >50 % of waking hours                              []     3    Only limited self care, in bed greater than 50% of waking hours []     4    Completely disabled, no self care, confined to bed or chair []     5    Moribund   Past Medical History:  Diagnosis Date  . Adenomatous polyp   . Arthritis    "mostly fingers" (10/07/2016)  . At high risk for falls   . 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 Chronic back pain    "worse in my neck; lower back also affected"  (10/07/2016)  . Complication of anesthesia    "never fully regained my taste after my cervical fusion; I don't take much RX so I'm very sensitive to any sedation" (10/07/2016)  . Concussion    X2  . DDD (degenerative disc disease), cervical    lumbar  . Depression    "hx; take Prozac to keep me stable" (10/07/2016)  . Dizziness    caused by compression of cervical disc  . Family history of adverse reaction to anesthesia    "daughters get PONV; one daughter breaks out severely; grandson got suspended in a state of anxiousness after kidney surgery; had to be held for hours"  . GERD (gastroesophageal reflux disease)   . Hyperlipidemia   . OSA on CPAP   . Pneumonia    "once as an adult" (10/07/2016)  . Thyroid nodule   . Type II diabetes mellitus ([place])    controlled by diet and exercide    Past Surgical History:  Procedure Laterality Date  . ANTERIOR CERVICAL DECOMP/DISCECTOMY FUSION  2015  . BACK SURGERY    . CARPAL TUNNEL RELEASE Bilateral   . CATARACT EXTRACTION, BILATERAL Bilateral  09/10/2014 - 09/24/2014   by Darleen Crocker  . CHOLECYSTECTOMY N/A 10/16/2016   Procedure: LAPAROSCOPIC CHOLECYSTECTOMY;  Surgeon: [name], MD;  Location: [facility];  Service: General;  Laterality: N/A;  . LAPAROSCOPIC CHOLECYSTECTOMY  10/16/2016  . [facility]  2007  . TONSILLECTOMY  05/05/1983  . TUBAL LIGATION  02/09/1979    Family History  Problem Relation Age of Onset  . Mitral valve prolapse Mother   . Hyperlipidemia Mother   . Macular degeneration Mother   . Hypothyroidism Mother   . Arthritis Mother   . Atrial fibrillation Father   . COPD Father   . Osteoarthritis Sister   . Hypothyroidism Sister   . Hyperlipidemia Sister   . Dementia Maternal Grandmother   . Breast cancer Maternal Grandmother   . Hypertension Maternal Grandmother   . Arthritis Maternal Grandmother   . Diabetes type I Maternal Grandfather   . Arthritis Sister   . Hypothyroidism Sister   . Fibromyalgia Sister    . Other Other        Osgood-schlatter    Social History Social History  Substance Use Topics  . Smoking status: Former Smoker    Packs/day: 0.25    Years: 50.00    Types: Cigarettes    Quit date: 10/16/2016  . Smokeless tobacco: Never Used     Comment: 10/16/2016 "weaned down the past 9 days; on RX now; done!!!!!!!!"  . Alcohol use No    Current Outpatient Prescriptions  Medication Sig Dispense Refill  . aspirin EC 81 MG tablet Take 81 mg by mouth at bedtime.     Marland Kitchen buPROPion (WELLBUTRIN SR) 150 MG 12 hr tablet Take 150 mg by mouth See admin instructions. Take 1 tablet (150 mg) by mouth once daily x3 days then increase to twice daily    . Calcium-Phosphorus-Vitamin D (CITRACAL +D3 PO) Take 1 tablet by mouth at bedtime.    . cetirizine (ZYRTEC) 10 MG tablet Take 10 mg by mouth at bedtime.    Marland Kitchen FLUoxetine (PROZAC) 40 MG capsule Take 40 mg by mouth at bedtime.     Marland Kitchen ibuprofen (ADVIL,MOTRIN) 200 MG tablet Take 400-800 mg by mouth every 8 (eight) hours as needed (for pain.).    Marland Kitchen lisinopril (PRINIVIL,ZESTRIL) 10 MG tablet Take 10 mg by mouth daily.    . Multiple Vitamins-Minerals (MULTIVITAMIN WITH MINERALS) tablet Take 1 tablet by mouth at bedtime.     . Omega-3 Fatty Acids (OMEGA-3 PO) Take 540 mg by mouth at bedtime.    . pantoprazole (PROTONIX) 40 MG tablet Take 40 mg by mouth daily.    . rosuvastatin (CRESTOR) 10 MG tablet Take 10 mg by mouth at bedtime. Reported on 09/18/2015  5  . hydrocortisone cream 1 % Apply 1 application topically daily as needed for itching.     No current facility-administered medications for this visit.     Allergies  Allergen Reactions  . Lactose Intolerance (Gi) Diarrhea  . Latex Rash    Review of Systems  Constitutional: Positive for unexpected weight change (lost 15 pounds after cholecystectomy). Negative for activity change and appetite change.  HENT: Negative for trouble swallowing and voice change.        Nodule in neck  Respiratory:  Positive for apnea (CPAP). Negative for cough, shortness of breath and wheezing.   Cardiovascular: Positive for chest pain. Negative for palpitations and leg swelling.  Gastrointestinal: Positive for abdominal pain (Reflux). Negative for blood in stool.  Genitourinary: Negative for difficulty urinating  and dysuria.  Musculoskeletal: Positive for arthralgias and joint swelling.  Neurological: Positive for dizziness. Negative for seizures, syncope and headaches.  Hematological: Negative for adenopathy. Does not bruise/bleed easily.    BP 99/62   Pulse 76   Resp 20   Ht 5\' 5"  (1.651 m)   Wt 155 lb (70.3 kg)   SpO2 95% Comment: RA  BMI 25.79 kg/m  Physical Exam  Constitutional: She is oriented to person, place, and time. She appears well-developed and well-nourished. No distress.  HENT:  Head: Normocephalic and atraumatic.  Mouth/Throat: No oropharyngeal exudate.  Eyes: Conjunctivae and EOM are normal. No scleral icterus.  Neck:  Palpable 2 cm nodule right thyroid  Cardiovascular: Normal rate, regular rhythm, normal heart sounds and intact distal pulses.  Exam reveals no gallop and no friction rub.   No murmur heard. Pulmonary/Chest: Effort normal and breath sounds normal. No respiratory distress. She has no wheezes. She has no rales.  Abdominal: Soft. She exhibits no distension. There is no tenderness.  Musculoskeletal: She exhibits no edema or deformity.  Lymphadenopathy:    She has no cervical adenopathy.  Neurological: She is alert and oriented to person, place, and time. No cranial nerve deficit.  No focal motor deficit  Skin: Skin is warm and dry.  Vitals reviewed.    Diagnostic Tests: CT CHEST WITHOUT CONTRAST  TECHNIQUE: Multidetector CT imaging of the chest was performed following the standard protocol without IV contrast.  COMPARISON:  Chest radiograph from one day prior.  FINDINGS: Cardiovascular: Normal heart size. No significant  pericardial fluid/thickening. Left anterior descending, left circumflex and right coronary atherosclerosis. Atherosclerotic nonaneurysmal thoracic. Normal caliber pulmonary arteries.  Mediastinum/Nodes: Peripherally calcified hypodense 2.3 cm right thyroid lobe nodule Unremarkable esophagus. No pathologically enlarged axillary, mediastinal or gross hilar lymph nodes, noting limited sensitivity for the detection of hilar adenopathy on this noncontrast study.  Lungs/Pleura: No pneumothorax. No pleural effusion. Anterior basilar right upper lobe 6 mm ground-glass pulmonary nodule (series 4/ image 71). Minimal pleural-parenchymal scarring in the anterior lingula. No acute consolidative airspace disease, lung masses or additional significant pulmonary nodules.  Upper abdomen: Unremarkable.  Musculoskeletal: No aggressive appearing focal osseous lesions. Mild thoracic spondylosis. Partially visualized surgical hardware from ACDF in the lower cervical spine.  IMPRESSION: 1. Small opacity described in the lower left lung on the recent chest radiograph correlates with mild pleural-parenchymal scarring in the lingula. 2. Solitary 6 mm basilar right upper lobe ground-glass pulmonary nodule. Initial follow-up with CT at 6-12 months is recommended to confirm persistence. If persistent, repeat CT is recommended every 2 years until 5 years of stability has been established. This recommendation follows the consensus statement: Guidelines for Management of Incidental Pulmonary Nodules Detected on CT Images: From the Fleischner Society 2017; Radiology 2017; 284:228-243. 3. Otherwise no active pulmonary disease . 4. Aortic atherosclerosis.  Three-vessel coronary atherosclerosis. 5. Peripherally calcified 2.3 cm right thyroid lobe nodule, for which thyroid ultrasound correlation is recommended. This follows ACR consensus guidelines: Managing Incidental Thyroid Nodules Detected on Imaging:  White Paper of the ACR Incidental Thyroid Findings Committee. J Am Coll Radiol 2015; 12:143-150.   Electronically Signed   By: Ilona Sorrel M.D.   On: 10/07/2016 18:46 I personally reviewed the CT chest and concur with the findings noted above.  Impression: 71 year old woman with a 50-pack-year history of tobacco abuse who has a 6 mm round last nodule in the right upper lobe as well as opacity in the left lower lobe. Radiology feels the left  lower lobe opacity is consistent with pleural-parenchymal scarring in the lingula. While I suspect that his true, I cannot rule out the possibility of a lung nodule in that area. For that reason I think she needs earlier follow-up with a CT at 3 months.  Regarding the groundglass opacity in the right upper lobe. There is no indication for intervention with biopsy or surgery at this time. She does need follow-up CT of that area as well. If not for the findings in the left lower lobe, I would recommend a CT at 6 months. That area will have to be followed either to resolution or out to 5 years to ensure stability if it persists.  She has a right thyroid nodule. She has had biopsies previously. Her primary care is managing that issue.  Plan:  Return in 2 months (3 months from previous scan) with CT chest to follow-up groundglass opacity in right upper lobe and opacity in left lingula.  Melrose Nakayama, MD [facility] [phone]

## 2016-11-10 NOTE — Telephone Encounter (Signed)
SENT NOTES TO SCHEDULING 

## 2016-12-16 ENCOUNTER — Emergency Department (HOSPITAL_COMMUNITY): Payer: Medicare Other

## 2016-12-16 ENCOUNTER — Inpatient Hospital Stay (HOSPITAL_COMMUNITY)
Admission: EM | Admit: 2016-12-16 | Discharge: 2016-12-18 | DRG: 200 | Disposition: A | Discharge status: Home Health | Payer: Medicare Other | Attending: General Surgery | Admitting: General Surgery

## 2016-12-16 ENCOUNTER — Encounter (HOSPITAL_COMMUNITY): Payer: Self-pay

## 2016-12-16 DIAGNOSIS — G4733 Obstructive sleep apnea (adult) (pediatric): Secondary | ICD-10-CM | POA: Diagnosis present

## 2016-12-16 DIAGNOSIS — F329 Major depressive disorder, single episode, unspecified: Secondary | ICD-10-CM | POA: Diagnosis present

## 2016-12-16 DIAGNOSIS — I10 Essential (primary) hypertension: Secondary | ICD-10-CM | POA: Diagnosis present

## 2016-12-16 DIAGNOSIS — Y92002 Bathroom of unspecified non-institutional (private) residence single-family (private) house as the place of occurrence of the external cause: Secondary | ICD-10-CM

## 2016-12-16 DIAGNOSIS — R0902 Hypoxemia: Secondary | ICD-10-CM | POA: Diagnosis present

## 2016-12-16 DIAGNOSIS — W182XXA Fall in (into) shower or empty bathtub, initial encounter: Secondary | ICD-10-CM | POA: Diagnosis present

## 2016-12-16 DIAGNOSIS — Z8249 Family history of ischemic heart disease and other diseases of the circulatory system: Secondary | ICD-10-CM

## 2016-12-16 DIAGNOSIS — Z7982 Long term (current) use of aspirin: Secondary | ICD-10-CM

## 2016-12-16 DIAGNOSIS — J939 Pneumothorax, unspecified: Secondary | ICD-10-CM

## 2016-12-16 DIAGNOSIS — S270XXA Traumatic pneumothorax, initial encounter: Secondary | ICD-10-CM | POA: Diagnosis not present

## 2016-12-16 DIAGNOSIS — Z91040 Latex allergy status: Secondary | ICD-10-CM

## 2016-12-16 DIAGNOSIS — M549 Dorsalgia, unspecified: Secondary | ICD-10-CM | POA: Diagnosis present

## 2016-12-16 DIAGNOSIS — S2231XA Fracture of one rib, right side, initial encounter for closed fracture: Secondary | ICD-10-CM

## 2016-12-16 DIAGNOSIS — S2241XA Multiple fractures of ribs, right side, initial encounter for closed fracture: Secondary | ICD-10-CM | POA: Diagnosis present

## 2016-12-16 DIAGNOSIS — E785 Hyperlipidemia, unspecified: Secondary | ICD-10-CM | POA: Diagnosis present

## 2016-12-16 DIAGNOSIS — Z833 Family history of diabetes mellitus: Secondary | ICD-10-CM

## 2016-12-16 DIAGNOSIS — Z79899 Other long term (current) drug therapy: Secondary | ICD-10-CM

## 2016-12-16 DIAGNOSIS — Z86010 Personal history of colonic polyps: Secondary | ICD-10-CM

## 2016-12-16 DIAGNOSIS — E739 Lactose intolerance, unspecified: Secondary | ICD-10-CM | POA: Diagnosis present

## 2016-12-16 DIAGNOSIS — Z87891 Personal history of nicotine dependence: Secondary | ICD-10-CM

## 2016-12-16 DIAGNOSIS — K219 Gastro-esophageal reflux disease without esophagitis: Secondary | ICD-10-CM | POA: Diagnosis present

## 2016-12-16 DIAGNOSIS — E119 Type 2 diabetes mellitus without complications: Secondary | ICD-10-CM | POA: Diagnosis present

## 2016-12-16 DIAGNOSIS — Y93E1 Activity, personal bathing and showering: Secondary | ICD-10-CM

## 2016-12-16 DIAGNOSIS — G8929 Other chronic pain: Secondary | ICD-10-CM | POA: Diagnosis present

## 2016-12-16 HISTORY — DX: Chronic cholecystitis: K81.1

## 2016-12-16 LAB — BASIC METABOLIC PANEL
Anion gap: 8 (ref 5–15)
BUN: 25 mg/dL — ABNORMAL HIGH (ref 6–20)
CO2: 27 mmol/L (ref 22–32)
Calcium: 9.6 mg/dL (ref 8.9–10.3)
Chloride: 107 mmol/L (ref 101–111)
Creatinine, Ser: 1.01 mg/dL — ABNORMAL HIGH (ref 0.44–1.00)
GFR calc Af Amer: >60 mL/min (ref >60)
GFR calc non Af Amer: 55 mL/min — ABNORMAL LOW (ref >60)
Glucose, Bld: 157 mg/dL — ABNORMAL HIGH (ref 65–99)
Potassium: 4.3 mmol/L (ref 3.5–5.1)
Sodium: 142 mmol/L (ref 135–145)

## 2016-12-16 LAB — CBC WITH DIFFERENTIAL/PLATELET
Basophils Absolute: 0 10*3/uL (ref 0.0–0.1)
Basophils Relative: 0 %
Eosinophils Absolute: 0 10*3/uL (ref 0.0–0.7)
Eosinophils Relative: 0 %
HCT: 41 % (ref 36.0–46.0)
Hemoglobin: 14.6 g/dL (ref 12.0–15.0)
Lymphocytes Relative: 10 %
Lymphs Abs: 1.2 10*3/uL (ref 0.7–4.0)
MCH: 31.4 pg (ref 26.0–34.0)
MCHC: 35.6 g/dL (ref 30.0–36.0)
MCV: 88.2 fL (ref 78.0–100.0)
Monocytes Absolute: 0.7 10*3/uL (ref 0.1–1.0)
Monocytes Relative: 5 %
Neutro Abs: 11 10*3/uL — ABNORMAL HIGH (ref 1.7–7.7)
Neutrophils Relative %: 85 %
Platelets: 198 10*3/uL (ref 150–400)
RBC: 4.65 MIL/uL (ref 3.87–5.11)
RDW: 13.4 % (ref 11.5–15.5)
WBC: 12.9 10*3/uL — ABNORMAL HIGH (ref 4.0–10.5)

## 2016-12-16 MED ORDER — MORPHINE SULFATE (PF) 2 MG/ML IV SOLN
4.0000 mg | Freq: Once | INTRAVENOUS | Status: AC
  Administered 2016-12-16: 4 mg via INTRAVENOUS
  Filled 2016-12-16: qty 2

## 2016-12-16 MED ORDER — HYDROMORPHONE HCL 1 MG/ML IJ SOLN
1.0000 mg | Freq: Once | INTRAMUSCULAR | Status: AC
  Administered 2016-12-16: 1 mg via INTRAVENOUS
  Filled 2016-12-16: qty 1

## 2016-12-16 NOTE — ED Triage Notes (Signed)
She states she slipped and fell in her shower at home. She states she landed on her right side, and is c/o right thoracic/ribs area pain; also c/o some right-sided neck discomfort and is concerned about this d/t having a "cervical fusion in 2015". Some ecchymosis noted at right ribs area and I feel what I believe to be subcutaneous emphysema in a small area of mid right lat. Thorax area. She has rec'd. 143mcg of Fentanyl IV en route to E.D.

## 2016-12-16 NOTE — ED Provider Notes (Signed)
[facility] DEPT Provider Note   CSN: [id number] Arrival date & time: 12/16/16  1745     History   Chief Complaint Chief Complaint  Patient presents with  . Fall  . Rib Injury    HPI Krista Austin is a 71 y.o. female.  HPI   71 year old female presents today with complaints of rib pain.  Patient reports she was taking a shower when she slipped in the tub landing on the side of the tub.  She notes she struck her right lateral chest wall causing immediate pain.  She notes pain with inspiration, but denies any significant shortness of breath.  Patient notes that she has cervical and lumbar fusions, notes that after the fall she is having pain in the posterior neck and lower back.  She notes these pains are not sharp, she did not strike her head, lose consciousness because any other soft tissue damage to these areas.  She denies any loss of distal sensation strength or motor function.  She denies any abdominal pain, nausea or vomiting.   Past Medical History:  Diagnosis Date  . Adenomatous polyp   . Arthritis    "mostly fingers" (10/07/2016)  . At high risk for falls   . 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 Chronic back pain    "worse in my neck; lower back also affected" (10/07/2016)  . Complication of anesthesia    "never fully regained my taste after my cervical fusion; I don't take much RX so I'm very sensitive to any sedation" (10/07/2016)  . Concussion    X2  . DDD (degenerative disc disease), cervical    lumbar  . Depression    "hx; take Prozac to keep me stable" (10/07/2016)  . Dizziness    caused by compression of cervical disc  . Family history of adverse reaction to anesthesia    "daughters get PONV; one daughter breaks out severely; grandson got suspended in a state of anxiousness after kidney surgery; had to be held for hours"  . GERD (gastroesophageal reflux disease)   . Hyperlipidemia   . OSA on CPAP   .  Pneumonia    "once as an adult" (10/07/2016)  . Thyroid nodule   . Type II diabetes mellitus ([place])    controlled by diet and exercide    Patient Active Problem List   Diagnosis Date Noted  . Chronic cholecystitis 10/16/2016  . Dyslipidemia 10/07/2016  . Abnormal CXR   . Chest pain 10/06/2016    Past Surgical History:  Procedure Laterality Date  . ANTERIOR CERVICAL DECOMP/DISCECTOMY FUSION  2015  . BACK SURGERY    . CARPAL TUNNEL RELEASE Bilateral   . CATARACT EXTRACTION, BILATERAL Bilateral 09/10/2014 - 09/24/2014   by Darleen Crocker  . CHOLECYSTECTOMY N/A 10/16/2016   Procedure: LAPAROSCOPIC CHOLECYSTECTOMY;  Surgeon: [name], MD;  Location: [facility];  Service: General;  Laterality: N/A;  . LAPAROSCOPIC CHOLECYSTECTOMY  10/16/2016  . [place] SURGERY  2007  . TONSILLECTOMY  05/05/1983  . TUBAL LIGATION  02/09/1979    OB History    No data available       Home Medications    Prior to Admission medications   Medication Sig Start Date End Date Taking? Authorizing Provider  aspirin EC 81 MG tablet Take 81 mg by mouth at bedtime.    Yes [provider]  buPROPion (WELLBUTRIN SR) 150 MG 12 hr tablet Take 150 mg  by mouth See admin instructions. Take 1 tablet (150 mg) by mouth once daily x3 days then increase to twice daily 10/12/16  Yes [provider]  Calcium-Phosphorus-Vitamin D (CITRACAL +D3 PO) Take 1 tablet by mouth at bedtime.   Yes [provider]  cetirizine (ZYRTEC) 10 MG tablet Take 10 mg by mouth at bedtime.   Yes [provider]  CINNAMON PO Take 2 capsules by mouth daily.   Yes [provider]  FLUoxetine (PROZAC) 40 MG capsule Take 40 mg by mouth at bedtime.    Yes [provider]  ibuprofen (ADVIL,MOTRIN) 200 MG tablet Take 400-800 mg by mouth every 8 (eight) hours as needed (for pain.).   Yes [provider]  lisinopril (PRINIVIL,ZESTRIL) 10 MG tablet Take 10 mg by mouth daily.   Yes [provider]  Multiple Vitamins-Minerals (MULTIVITAMIN WITH MINERALS) tablet Take 1 tablet by mouth at bedtime.    Yes [provider]  Omega-3 Fatty Acids (FISH OIL PO) Take 2 capsules by mouth daily.   Yes [provider]  Omega-3 Fatty Acids (OMEGA-3 PO) Take 540 mg by mouth at bedtime.   Yes [provider]  pantoprazole (PROTONIX) 40 MG tablet Take 40 mg by mouth daily. 09/30/16  Yes [provider]  rosuvastatin (CRESTOR) 10 MG tablet Take 10 mg by mouth at bedtime. Reported on 09/18/2015 07/22/15  Yes [provider]  hydrocortisone cream 1 % Apply 1 application topically daily as needed for itching.    [provider]    Family History Family History  Problem Relation Age of Onset  . Mitral valve prolapse Mother   . Hyperlipidemia Mother   . Macular degeneration Mother   . Hypothyroidism Mother   . Arthritis Mother   . Atrial fibrillation Father   . COPD Father   . Osteoarthritis Sister   . Hypothyroidism Sister   . Hyperlipidemia Sister   . Dementia Maternal Grandmother   . Breast cancer Maternal Grandmother   . Hypertension Maternal Grandmother   . Arthritis Maternal Grandmother   . Diabetes type I Maternal Grandfather   . Arthritis Sister   . Hypothyroidism Sister   . Fibromyalgia Sister   . Other Other        Osgood-schlatter    Social History Social History  Substance Use Topics  . Smoking status: Former Smoker    Packs/day: 0.25    Years: 50.00    Types: Cigarettes    Quit date: 10/16/2016  . Smokeless tobacco: Never Used     Comment: 10/16/2016 "weaned down the past 9 days; on RX now; done!!!!!!!!"  . Alcohol use No     Allergies   Lactose intolerance (gi) and Latex   Review of Systems Review of Systems  All other systems reviewed and are negative.  Physical Exam Updated Vital Signs BP 120/79 (BP Location: Left Arm)   Pulse 70   Temp 98.9 F (37.2 C) (Oral)   Resp 16   Ht 5\' 5"  (1.651 m)    Wt 70.3 kg (155 lb)   SpO2 95%   BMI 25.79 kg/m   Physical Exam  Constitutional: She is oriented to person, place, and time. She appears well-developed and well-nourished.  HENT:  Head: Normocephalic and atraumatic.  Eyes: Pupils are equal, round, and reactive to light. Conjunctivae are normal. Right eye exhibits no discharge. Left eye exhibits no discharge. No scleral icterus.  Neck: Normal range of motion. No JVD present. No tracheal deviation present.  Cardiovascular: Normal rate, regular rhythm and normal heart sounds.   Pulmonary/Chest: Effort normal and breath sounds normal. No stridor.  Tenderness to palpation or right lateral and posterior ribs, lung sounds clear bilateral although poor respiratory effort due to pain   Abdominal: Soft. She exhibits no distension and no mass. There is no tenderness. There is no rebound and no guarding.  Musculoskeletal:  No CT or L-spine tenderness to palpation.   Neurological: She is alert and oriented to person, place, and time. She has normal strength. No cranial nerve deficit or sensory deficit. Coordination normal. GCS eye subscore is 4. GCS verbal subscore is 5. GCS motor subscore is 6.  Skin: Skin is warm.  Psychiatric: She has a normal mood and affect. Her behavior is normal. Judgment and thought content normal.  Nursing note and vitals reviewed.   ED Treatments / Results  Labs (all labs ordered are listed, but only abnormal results are displayed) Labs Reviewed  CBC WITH DIFFERENTIAL/PLATELET  BASIC METABOLIC PANEL    EKG  EKG Interpretation None       Radiology Dg Ribs Unilateral W/chest Right  Result Date: 12/16/2016 CLINICAL DATA:  Fall.  Right lateral chest pain and lower rib pain. EXAM: RIGHT RIBS AND CHEST - 3+ VIEW COMPARISON:  10/07/2016 FINDINGS: There is an acute fracture involving the anterior aspect of the right ninth rib. No additional fractures identified. The lungs are clear. Heart size is normal. No pleural  effusion or edema. IMPRESSION: 1. Acute fracture involves the anterior aspect of the right ninth rib. Electronically Signed   By: Kerby Moors M.D.   On: 12/16/2016 19:20   Dg Cervical Spine Complete  Result Date: 12/16/2016 CLINICAL DATA:  [place] in bathtub, with neck pain EXAM: CERVICAL SPINE - COMPLETE 4+ VIEW COMPARISON:  02/29/2012, 02/01/2012 FINDINGS: Suboptimal visualization of C7. Status post anterior plate and screw fixation at C5 and C6 with interbody disc devices and solid bone fusion present. Straightening. Upper vertebral bodies are maintained. Moderate degenerative changes at C4-C5. Lung apices clear. Dens and lateral masses are within normal limits. IMPRESSION: 1. Inadequate visualization of C7 2. Postsurgical changes at C5 and C6. Degenerative changes. No other specific abnormalities are seen. Electronically Signed   By: Donavan Foil M.D.   On: 12/16/2016 19:22   Dg Thoracic Spine 2 View  Result Date: 12/16/2016 CLINICAL DATA:  Fall EXAM: THORACIC SPINE 2 VIEWS COMPARISON:  None. FINDINGS: There is no evidence of thoracic spine fracture. Alignment is normal. No other significant bone abnormalities are identified. Lower cervical ACDF. IMPRESSION: No compression fracture or static subluxation of the thoracic spine. Electronically Signed   By: Ulyses Jarred M.D.   On: 12/16/2016 19:21   Dg Lumbar Spine Complete  Result Date: 12/16/2016 CLINICAL DATA:  Status post fall. EXAM: LUMBAR SPINE - COMPLETE 4+ VIEW COMPARISON:  None FINDINGS: There is a curvature the lumbar spine which is convex ports the left. The vertebral body heights are well preserved. Disc space narrowing and ventral endplate spurring is noted throughout the lumbar spine. This is most advanced at L2-3. Aortic atherosclerosis identified. IMPRESSION: 1. Mild curvature of the thoracic scratch set mild scoliosis and moderate degenerative disc disease Electronically Signed   By: Kerby Moors M.D.   On: 12/16/2016 19:25   Ct  Chest Wo Contrast  Result Date: 12/16/2016 CLINICAL DATA:  [place] and landed on right side, thoracic and rib pain EXAM: CT CHEST WITHOUT CONTRAST TECHNIQUE: Multidetector CT imaging of the chest was performed following  the standard protocol without IV contrast. COMPARISON:  12/16/2016, 10/07/2016, CT cervical spine 03/01/2015 FINDINGS: Cardiovascular: Limited evaluation without intravenous contrast. Aortic atherosclerosis. No aneurysmal dilatation. Coronary artery calcification. Normal heart size. No large pericardial effusion. Mediastinum/Nodes: Negative for mediastinal fluid collection. Enlarged thyroid with 2.2 cm rim calcified mass in the right lobe, unchanged. Midline trachea. No significantly enlarged lymph nodes. Esophagus within normal limits Lungs/Pleura: Tiny right anterior and basilar pneumothorax, less than 10%. Hazy density in the posterior right lung base may reflect atelectasis or mild contusion. Trace pleural fluid on the right. Previously described ground-glass density in the right upper lobe is less apparent on the current exam. Upper Abdomen: No acute abnormality is seen. Surgical clips at the gallbladder fossa Musculoskeletal: Nondisplaced right ninth posterior rib fracture. Comminuted right posterior tenth rib fracture with bone fragment impinging on the pleural surface. Small subcutaneous emphysema within the posterior lower chest wall soft tissues in the vicinity of the rib fractures. IMPRESSION: 1. Acute right ninth and tenth posterior rib fractures with small right anterior and basilar pneumothorax. No midline shift. Mild hazy density in the posterior right lung base may reflect atelectasis or mild contusion. There is small to moderate soft tissue emphysema within the right posterior chest wall soft tissues in the vicinity of the rib fractures. 2. Stable 2.2 cm rim calcified mass in the right lobe of thyroid 3. The previously described right upper lobe ground-glass nodule is less apparent on  the current study. Critical Value/emergent results were called by telephone at the time of interpretation on 12/16/2016 at 10:04 pm to Dr. Okey Regal , who verbally acknowledged these results. Aortic Atherosclerosis (ICD10-I70.0). Electronically Signed   By: Donavan Foil M.D.   On: 12/16/2016 22:04   Ct Cervical Spine Wo Contrast  Result Date: 12/16/2016 CLINICAL DATA:  71 y/o  F; fall in shower with neck pain. EXAM: CT CERVICAL SPINE WITHOUT CONTRAST TECHNIQUE: Multidetector CT imaging of the cervical spine was performed without intravenous contrast. Multiplanar CT image reconstructions were also generated. COMPARISON:  03/01/2015 CT of the cervical spine. FINDINGS: Alignment: Mild stable reversal of cervical curvature from the C3 through C5 levels. No listhesis. Skull base and vertebrae: No acute fracture or dislocation is identified. Anterior cervical discectomy and fusion at C5-6 with interbody fusion. Soft tissues and spinal canal: No prevertebral fluid or swelling. No visible canal hematoma. Disc levels: Mild C4-5 and C6-7 discogenic degenerative changes. Prominent productive changes of the anterior C1-2 articulation. Upper chest: Trace right apical pneumothorax is partially visualized. Please refer to the concurrent CT of the chest for further evaluation of lungs and mediastinum. Other: Multinodular thyroid goiter with the largest nodule in the right lobe of thyroid measuring up to 22 mm. Nodules are stable from prior CT of the cervical spine. Minimal calcific atherosclerosis of carotid bifurcations. IMPRESSION: 1. No acute fracture or dislocation of the cervical spine identified. 2. Trace right apical pneumothorax is partially visualized. Please refer to the concurrent CT of chest for further evaluation of lungs and mediastinum. These results were called by telephone at the time of interpretation on 12/16/2016 at 10:01 pm to Dr. Okey Regal , who verbally acknowledged these results. Electronically  Signed   By: Kristine Garbe M.D.   On: 12/16/2016 22:02    Procedures Procedures (including critical care time)  Medications Ordered in ED Medications  morphine 2 MG/ML injection 4 mg (4 mg Intravenous Given 12/16/16 1827)  morphine 2 MG/ML injection 4 mg (4 mg Intravenous Given 12/16/16 2000)  HYDROmorphone (DILAUDID) injection 1 mg (1 mg Intravenous Given 12/16/16 2043)    Initial Impression / Assessment and Plan / ED Course  I have reviewed the triage vital signs and the nursing notes.  Pertinent labs & imaging results that were available during my care of the patient were reviewed by me and considered in my medical decision making (see chart for details).     Final Clinical Impressions(s) / ED Diagnoses   Final diagnoses:  Closed fracture of multiple ribs of right side, initial encounter  Traumatic pneumothorax, initial encounter   Labs:   Imaging: CT cervical, CT chest without, DG ribs unilateral his chest, DG cervical dominant DG thoracic, DG lumbar  Consults: General surgery  Therapeutics: Morphine, Dilaudid  Discharge Meds:   Assessment/Plan: 71 year old female presents today with acute right ninth and 10th posterior rib fractures with small anterior and basilar pneumothorax.  Patient in no respiratory distress, pain was managed after several doses of morphine and Dilaudid.  General surgery will be consulted for evaluation.  General surgery is currently in the OR- Pt will be evaluated in the ED. Care sign to oncoming provider pending surgical evaluation and disposition.     New Prescriptions New Prescriptions   No medications on file     Francee Gentile 12/16/16 2245    Gareth Morgan, MD 12/17/16 [phone]

## 2016-12-16 NOTE — ED Notes (Signed)
RN going to draw blood work from IV.

## 2016-12-17 ENCOUNTER — Encounter (HOSPITAL_COMMUNITY): Payer: Self-pay | Admitting: Surgery

## 2016-12-17 ENCOUNTER — Observation Stay (HOSPITAL_COMMUNITY): Payer: Medicare Other

## 2016-12-17 DIAGNOSIS — G4733 Obstructive sleep apnea (adult) (pediatric): Secondary | ICD-10-CM | POA: Diagnosis present

## 2016-12-17 DIAGNOSIS — S2241XA Multiple fractures of ribs, right side, initial encounter for closed fracture: Secondary | ICD-10-CM | POA: Insufficient documentation

## 2016-12-17 DIAGNOSIS — G8929 Other chronic pain: Secondary | ICD-10-CM | POA: Diagnosis present

## 2016-12-17 DIAGNOSIS — J939 Pneumothorax, unspecified: Secondary | ICD-10-CM | POA: Diagnosis present

## 2016-12-17 DIAGNOSIS — F329 Major depressive disorder, single episode, unspecified: Secondary | ICD-10-CM | POA: Diagnosis present

## 2016-12-17 DIAGNOSIS — Z91040 Latex allergy status: Secondary | ICD-10-CM | POA: Diagnosis not present

## 2016-12-17 DIAGNOSIS — Y93E1 Activity, personal bathing and showering: Secondary | ICD-10-CM | POA: Diagnosis not present

## 2016-12-17 DIAGNOSIS — I10 Essential (primary) hypertension: Secondary | ICD-10-CM | POA: Diagnosis present

## 2016-12-17 DIAGNOSIS — Z7982 Long term (current) use of aspirin: Secondary | ICD-10-CM | POA: Diagnosis not present

## 2016-12-17 DIAGNOSIS — R0902 Hypoxemia: Secondary | ICD-10-CM | POA: Diagnosis present

## 2016-12-17 DIAGNOSIS — M549 Dorsalgia, unspecified: Secondary | ICD-10-CM | POA: Diagnosis present

## 2016-12-17 DIAGNOSIS — Z79899 Other long term (current) drug therapy: Secondary | ICD-10-CM | POA: Diagnosis not present

## 2016-12-17 DIAGNOSIS — E119 Type 2 diabetes mellitus without complications: Secondary | ICD-10-CM | POA: Diagnosis present

## 2016-12-17 DIAGNOSIS — Y92002 Bathroom of unspecified non-institutional (private) residence single-family (private) house as the place of occurrence of the external cause: Secondary | ICD-10-CM | POA: Diagnosis not present

## 2016-12-17 DIAGNOSIS — E739 Lactose intolerance, unspecified: Secondary | ICD-10-CM | POA: Diagnosis present

## 2016-12-17 DIAGNOSIS — W182XXA Fall in (into) shower or empty bathtub, initial encounter: Secondary | ICD-10-CM | POA: Diagnosis present

## 2016-12-17 DIAGNOSIS — Z87891 Personal history of nicotine dependence: Secondary | ICD-10-CM | POA: Diagnosis not present

## 2016-12-17 DIAGNOSIS — Z86010 Personal history of colonic polyps: Secondary | ICD-10-CM | POA: Diagnosis not present

## 2016-12-17 DIAGNOSIS — Z8249 Family history of ischemic heart disease and other diseases of the circulatory system: Secondary | ICD-10-CM | POA: Diagnosis not present

## 2016-12-17 DIAGNOSIS — M5136 Other intervertebral disc degeneration, lumbar region: Secondary | ICD-10-CM | POA: Insufficient documentation

## 2016-12-17 DIAGNOSIS — S270XXA Traumatic pneumothorax, initial encounter: Secondary | ICD-10-CM | POA: Diagnosis present

## 2016-12-17 DIAGNOSIS — K219 Gastro-esophageal reflux disease without esophagitis: Secondary | ICD-10-CM | POA: Diagnosis present

## 2016-12-17 DIAGNOSIS — Z833 Family history of diabetes mellitus: Secondary | ICD-10-CM | POA: Diagnosis not present

## 2016-12-17 LAB — MRSA PCR SCREENING: MRSA by PCR: NEGATIVE

## 2016-12-17 MED ORDER — SODIUM CHLORIDE 0.9 % IV SOLN
8.0000 mg | Freq: Four times a day (QID) | INTRAVENOUS | Status: DC | PRN
  Filled 2016-12-17: qty 4

## 2016-12-17 MED ORDER — METOPROLOL TARTRATE 12.5 MG HALF TABLET: 12.5000 mg | ORAL_TABLET | Freq: Two times a day (BID) | ORAL | Status: DC | PRN

## 2016-12-17 MED ORDER — ADULT MULTIVITAMIN W/MINERALS CH
1.0000 | ORAL_TABLET | Freq: Every day | ORAL | Status: DC
  Administered 2016-12-17: 1 via ORAL
  Filled 2016-12-17: qty 1

## 2016-12-17 MED ORDER — OXYCODONE HCL 5 MG PO TABS: 5.0000 mg | ORAL_TABLET | ORAL | Status: DC | PRN

## 2016-12-17 MED ORDER — ONDANSETRON HCL 4 MG/2ML IJ SOLN
4.0000 mg | Freq: Four times a day (QID) | INTRAMUSCULAR | Status: DC | PRN
  Administered 2016-12-17: 4 mg via INTRAVENOUS
  Filled 2016-12-17: qty 2

## 2016-12-17 MED ORDER — MENTHOL 3 MG MT LOZG
1.0000 | LOZENGE | OROMUCOSAL | Status: DC | PRN
  Filled 2016-12-17: qty 9

## 2016-12-17 MED ORDER — ASPIRIN EC 81 MG PO TBEC
81.0000 mg | DELAYED_RELEASE_TABLET | Freq: Every day | ORAL | Status: DC
  Administered 2016-12-17: 81 mg via ORAL
  Filled 2016-12-17: qty 1

## 2016-12-17 MED ORDER — LORATADINE 10 MG PO TABS
10.0000 mg | ORAL_TABLET | Freq: Every day | ORAL | Status: DC
  Administered 2016-12-17 – 2016-12-18 (×2): 10 mg via ORAL
  Filled 2016-12-17 (×2): qty 1

## 2016-12-17 MED ORDER — METHOCARBAMOL 500 MG PO TABS
500.0000 mg | ORAL_TABLET | Freq: Three times a day (TID) | ORAL | Status: DC
  Administered 2016-12-17 – 2016-12-18 (×4): 500 mg via ORAL
  Filled 2016-12-17 (×4): qty 1

## 2016-12-17 MED ORDER — BISACODYL 10 MG RE SUPP: 10.0000 mg | Freq: Two times a day (BID) | RECTAL | Status: DC | PRN

## 2016-12-17 MED ORDER — PHENOL 1.4 % MT LIQD
1.0000 | OROMUCOSAL | Status: DC | PRN
  Filled 2016-12-17: qty 177

## 2016-12-17 MED ORDER — ALUM & MAG HYDROXIDE-SIMETH 200-200-20 MG/5ML PO SUSP: 30.0000 mL | Freq: Four times a day (QID) | ORAL | Status: DC | PRN

## 2016-12-17 MED ORDER — HYDROCORTISONE 1 % EX CREA
1.0000 "application " | TOPICAL_CREAM | Freq: Three times a day (TID) | CUTANEOUS | Status: DC | PRN
  Filled 2016-12-17: qty 28

## 2016-12-17 MED ORDER — GABAPENTIN 300 MG PO CAPS
300.0000 mg | ORAL_CAPSULE | Freq: Two times a day (BID) | ORAL | Status: DC
  Administered 2016-12-17 – 2016-12-18 (×2): 300 mg via ORAL
  Filled 2016-12-17 (×3): qty 1

## 2016-12-17 MED ORDER — POLYETHYLENE GLYCOL 3350 17 G PO PACK: 17.0000 g | PACK | Freq: Two times a day (BID) | ORAL | Status: DC | PRN

## 2016-12-17 MED ORDER — BLISTEX MEDICATED EX OINT
1.0000 "application " | TOPICAL_OINTMENT | Freq: Two times a day (BID) | CUTANEOUS | Status: DC
  Administered 2016-12-17: 1 via TOPICAL
  Filled 2016-12-17: qty 6.3

## 2016-12-17 MED ORDER — PROCHLORPERAZINE EDISYLATE 5 MG/ML IJ SOLN: 5.0000 mg | INTRAMUSCULAR | Status: DC | PRN

## 2016-12-17 MED ORDER — LACTATED RINGERS IV BOLUS (SEPSIS): 1000.0000 mL | Freq: Three times a day (TID) | INTRAVENOUS | Status: DC | PRN

## 2016-12-17 MED ORDER — ENOXAPARIN SODIUM 40 MG/0.4ML ~~LOC~~ SOLN
40.0000 mg | Freq: Every day | SUBCUTANEOUS | Status: DC
  Administered 2016-12-17 (×2): 40 mg via SUBCUTANEOUS
  Filled 2016-12-17 (×3): qty 0.4

## 2016-12-17 MED ORDER — GUAIFENESIN-DM 100-10 MG/5ML PO SYRP
10.0000 mL | ORAL_SOLUTION | ORAL | Status: DC | PRN
Start: 2016-12-17 — End: 2016-12-18

## 2016-12-17 MED ORDER — HYDROMORPHONE HCL 1 MG/ML IJ SOLN
0.5000 mg | INTRAMUSCULAR | Status: DC | PRN
  Administered 2016-12-17 (×2): 1 mg via INTRAVENOUS
  Filled 2016-12-17 (×3): qty 1

## 2016-12-17 MED ORDER — BUPROPION HCL ER (SR) 150 MG PO TB12
150.0000 mg | ORAL_TABLET | Freq: Two times a day (BID) | ORAL | Status: DC
  Administered 2016-12-17 – 2016-12-18 (×3): 150 mg via ORAL
  Filled 2016-12-17 (×3): qty 1

## 2016-12-17 MED ORDER — BACITRACIN-NEOMYCIN-POLYMYXIN OINTMENT TUBE
1.0000 "application " | TOPICAL_OINTMENT | Freq: Every day | CUTANEOUS | Status: DC
  Filled 2016-12-17: qty 14.17

## 2016-12-17 MED ORDER — DIPHENHYDRAMINE HCL 25 MG PO CAPS
25.0000 mg | ORAL_CAPSULE | Freq: Four times a day (QID) | ORAL | Status: DC | PRN
  Administered 2016-12-17 (×2): 25 mg via ORAL
  Filled 2016-12-17 (×2): qty 1

## 2016-12-17 MED ORDER — HYDROCORTISONE 2.5 % RE CREA
1.0000 | TOPICAL_CREAM | Freq: Four times a day (QID) | RECTAL | Status: DC | PRN
Start: 2016-12-17 — End: 2016-12-18
  Filled 2016-12-17: qty 28.35

## 2016-12-17 MED ORDER — HYDROCORTISONE 1 % EX CREA: 1.0000 "application " | TOPICAL_CREAM | Freq: Every day | CUTANEOUS | Status: DC | PRN

## 2016-12-17 MED ORDER — HYDROMORPHONE HCL 1 MG/ML IJ SOLN: 0.5000 mg | INTRAMUSCULAR | Status: DC | PRN

## 2016-12-17 MED ORDER — VITAMIN C 500 MG PO TABS
500.0000 mg | ORAL_TABLET | Freq: Two times a day (BID) | ORAL | Status: DC
  Administered 2016-12-17 – 2016-12-18 (×3): 500 mg via ORAL
  Filled 2016-12-17 (×5): qty 1

## 2016-12-17 MED ORDER — FLUOXETINE HCL 20 MG PO CAPS
40.0000 mg | ORAL_CAPSULE | Freq: Every day | ORAL | Status: DC
  Administered 2016-12-17 (×2): 40 mg via ORAL
  Filled 2016-12-17 (×2): qty 2

## 2016-12-17 MED ORDER — HYDRALAZINE HCL 20 MG/ML IJ SOLN: 5.0000 mg | Freq: Four times a day (QID) | INTRAMUSCULAR | Status: DC | PRN

## 2016-12-17 MED ORDER — ACETAMINOPHEN 500 MG PO TABS
1000.0000 mg | ORAL_TABLET | Freq: Three times a day (TID) | ORAL | Status: DC
  Administered 2016-12-17 – 2016-12-18 (×5): 1000 mg via ORAL
  Filled 2016-12-17 (×5): qty 2

## 2016-12-17 MED ORDER — ROSUVASTATIN CALCIUM 10 MG PO TABS
10.0000 mg | ORAL_TABLET | Freq: Every day | ORAL | Status: DC
  Administered 2016-12-17: 10 mg via ORAL
  Filled 2016-12-17 (×2): qty 1

## 2016-12-17 MED ORDER — METHOCARBAMOL 1000 MG/10ML IJ SOLN
1000.0000 mg | Freq: Four times a day (QID) | INTRAMUSCULAR | Status: DC | PRN
  Filled 2016-12-17: qty 10

## 2016-12-17 MED ORDER — DIPHENHYDRAMINE HCL 50 MG/ML IJ SOLN
12.5000 mg | Freq: Four times a day (QID) | INTRAMUSCULAR | Status: DC | PRN
  Administered 2016-12-17: 25 mg via INTRAVENOUS
  Filled 2016-12-17: qty 1

## 2016-12-17 MED ORDER — PSYLLIUM 95 % PO PACK
1.0000 | PACK | Freq: Every day | ORAL | Status: DC
  Administered 2016-12-18: 1 via ORAL
  Filled 2016-12-17 (×2): qty 1

## 2016-12-17 MED ORDER — OXYCODONE HCL 5 MG PO TABS
5.0000 mg | ORAL_TABLET | ORAL | Status: DC | PRN
  Administered 2016-12-17 – 2016-12-18 (×5): 5 mg via ORAL
  Filled 2016-12-17 (×5): qty 1

## 2016-12-17 MED ORDER — METHOCARBAMOL 500 MG PO TABS: 1000.0000 mg | ORAL_TABLET | Freq: Four times a day (QID) | ORAL | Status: DC | PRN

## 2016-12-17 MED ORDER — METOPROLOL TARTRATE 5 MG/5ML IV SOLN: 5.0000 mg | Freq: Four times a day (QID) | INTRAVENOUS | Status: DC | PRN

## 2016-12-17 MED ORDER — LISINOPRIL 10 MG PO TABS
10.0000 mg | ORAL_TABLET | Freq: Every day | ORAL | Status: DC
  Administered 2016-12-17 – 2016-12-18 (×2): 10 mg via ORAL
  Filled 2016-12-17 (×3): qty 1

## 2016-12-17 MED ORDER — PANTOPRAZOLE SODIUM 40 MG PO TBEC
40.0000 mg | DELAYED_RELEASE_TABLET | Freq: Every day | ORAL | Status: DC
  Administered 2016-12-17 – 2016-12-18 (×2): 40 mg via ORAL
  Filled 2016-12-17 (×2): qty 1

## 2016-12-17 MED ORDER — MAGIC MOUTHWASH
15.0000 mL | Freq: Four times a day (QID) | ORAL | Status: DC | PRN
  Filled 2016-12-17: qty 15

## 2016-12-17 MED ORDER — IBUPROFEN 200 MG PO TABS: 400.0000 mg | ORAL_TABLET | Freq: Four times a day (QID) | ORAL | Status: DC | PRN

## 2016-12-17 NOTE — Progress Notes (Signed)
Patient places herself on her home CPAP. No issues at this time. Will call if any assistance needed.

## 2016-12-17 NOTE — Clinical Social Work Note (Signed)
CSW completed SBIRT and assessed. PT is recommending home health PT-- pt aware and agreeable. Pt has much support at home. Clinical Social Worker will sign off for now as social work intervention is no longer needed. Please consult [place] again if new need arises.   [place], [place]

## 2016-12-17 NOTE — ED Notes (Signed)
Phone report given to [facility]

## 2016-12-17 NOTE — Clinical Social Work Note (Signed)
Clinical Social Work Assessment  Patient Details  Name: GHADA ABBETT MRN: [id number] Date of Birth: 1946-04-24  Date of referral:  12/17/16               Reason for consult:  Trauma                Permission sought to share information with:  Family Supports Permission granted to share information::  Yes, Verbal Permission Granted  Name::     Scientist, research (life sciences)::     Relationship::  Spouse  Contact Information:     Housing/Transportation Living arrangements for the past 2 months:  Single Family Home Source of Information:  Patient Patient Interpreter Needed:  None Criminal Activity/Legal Involvement Pertinent to Current Situation/Hospitalization:  No - Comment as needed Significant Relationships:  Adult Children, Dependent Children Lives with:  Adult Children Do you feel safe going back to the place where you live?  Yes Need for family participation in patient care:  Yes (Comment)  Care giving concerns:  Pt's daughter/POA present at bedside during initial assessment/trauma screening.   Social Worker assessment / plan:  Pt verbalized permission for CSW to complete trauma screening with pt's daughter in the room. Pt lives with her daughter, her daughters family, and her son whom has down syndrome. 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 Pt can recall fall in the shower. Pt reports she slipped. Pt reports her son in law has already gone upstairs and brought down the shower chair. Pt reports she does not drink alcohol or do any drugs and she is 8 weeks free of smoking. Pt denies any concern regarding substance use/abuse. PT recommended home health PT--pt aware.   Employment status:  Retired Forensic scientist:  Medicare PT Recommendations:  Home with [place] / Referral to community resources:  SBIRT  Patient/Family's Response to care:  Pt verbalized understanding of CSW role and expressed appreciation for support. Pt denies any concern regarding pt care at this time.  Pt denies any substance use/abuse at this time.   Patient/Family's Understanding of and Emotional Response to Diagnosis, Current Treatment, and Prognosis:   Pt's responses emotionally appropriate during conversation with CSW. Pt denies any concern regarding treatment plan at this time. Pt denies any substance use/abuse at this time. CSW will continue to provide support and facilitate d/c needs.   Emotional Assessment Appearance:  Appears stated age Attitude/Demeanor/Rapport:   (Patient was appropriate.) Affect (typically observed):  Accepting, Appropriate, Calm, Happy Orientation:  Oriented to Self, Oriented to Place, Oriented to  Time, Oriented to Situation Alcohol / Substance use:  Not Applicable Psych involvement (Current and /or in the community):  No (Comment)  Discharge Needs  Concerns to be addressed:  No discharge needs identified Readmission within the last 30 days:  No Current discharge risk:  Dependent with Mobility Barriers to Discharge:  Continued Medical Work up   [employer], LCSW 12/17/2016, 11:45 AM

## 2016-12-17 NOTE — Progress Notes (Signed)
Trauma Service Note  Subjective: Patient very pleasant.  No acute distress.  A little sleepy and cloudy  Objective: Vital signs in last 24 hours: Temp:  [97.8 F (36.6 C)-98.9 F (37.2 C)] 97.8 F (36.6 C) (08/16 0743) Pulse Rate:  [66-84] 84 (08/16 0825) Resp:  [8-20] 8 (08/16 0743) BP: (103-162)/(64-135) 111/64 (08/16 0743) SpO2:  [92 %-96 %] 94 % (08/16 0825) Weight:  [70.3 kg (155 lb)-72.1 kg (158 lb 15.2 oz)] 72.1 kg (158 lb 15.2 oz) (08/16 0622) Last BM Date: 12/16/16  Intake/Output from previous day: No intake/output data recorded. Intake/Output this shift: No intake/output data recorded.  General: No acute distress  Lungs: Diminished bilaterally.  No crepitance noted bilaterally  Abd: Soft, good bowel sounds.  Benign  Extremities: No changes  Neuro: Intact  Lab Results: CBC   Recent Labs  12/16/16 2240  WBC 12.9*  HGB 14.6  HCT 41.0  PLT 198   BMET  Recent Labs  12/16/16 2240  NA 142  K 4.3  CL 107  CO2 27  GLUCOSE 157*  BUN 25*  CREATININE 1.01*  CALCIUM 9.6   PT/INR No results for input(s): LABPROT, INR in the last 72 hours. ABG No results for input(s): PHART, HCO3 in the last 72 hours.  Invalid input(s): PCO2, PO2  Studies/Results: Dg Ribs Unilateral W/chest Right  Result Date: 12/16/2016 CLINICAL DATA:  Fall.  Right lateral chest pain and lower rib pain. EXAM: RIGHT RIBS AND CHEST - 3+ VIEW COMPARISON:  10/07/2016 FINDINGS: There is an acute fracture involving the anterior aspect of the right ninth rib. No additional fractures identified. The lungs are clear. Heart size is normal. No pleural effusion or edema. IMPRESSION: 1. Acute fracture involves the anterior aspect of the right ninth rib. Electronically Signed   By: Kerby Moors M.D.   On: 12/16/2016 19:20   Dg Cervical Spine Complete  Result Date: 12/16/2016 CLINICAL DATA:  [place] in bathtub, with neck pain EXAM: CERVICAL SPINE - COMPLETE 4+ VIEW COMPARISON:  02/29/2012,  02/01/2012 FINDINGS: Suboptimal visualization of C7. Status post anterior plate and screw fixation at C5 and C6 with interbody disc devices and solid bone fusion present. Straightening. Upper vertebral bodies are maintained. Moderate degenerative changes at C4-C5. Lung apices clear. Dens and lateral masses are within normal limits. IMPRESSION: 1. Inadequate visualization of C7 2. Postsurgical changes at C5 and C6. Degenerative changes. No other specific abnormalities are seen. Electronically Signed   By: Donavan Foil M.D.   On: 12/16/2016 19:22   Dg Thoracic Spine 2 View  Result Date: 12/16/2016 CLINICAL DATA:  Fall EXAM: THORACIC SPINE 2 VIEWS COMPARISON:  None. FINDINGS: There is no evidence of thoracic spine fracture. Alignment is normal. No other significant bone abnormalities are identified. Lower cervical ACDF. IMPRESSION: No compression fracture or static subluxation of the thoracic spine. Electronically Signed   By: Ulyses Jarred M.D.   On: 12/16/2016 19:21   Dg Lumbar Spine Complete  Result Date: 12/16/2016 CLINICAL DATA:  Status post fall. EXAM: LUMBAR SPINE - COMPLETE 4+ VIEW COMPARISON:  None FINDINGS: There is a curvature the lumbar spine which is convex ports the left. The vertebral body heights are well preserved. Disc space narrowing and ventral endplate spurring is noted throughout the lumbar spine. This is most advanced at L2-3. Aortic atherosclerosis identified. IMPRESSION: 1. Mild curvature of the thoracic scratch set mild scoliosis and moderate degenerative disc disease Electronically Signed   By: Kerby Moors M.D.   On: 12/16/2016 19:25  Ct Chest Wo Contrast  Result Date: 12/16/2016 CLINICAL DATA:  [place] and landed on right side, thoracic and rib pain EXAM: CT CHEST WITHOUT CONTRAST TECHNIQUE: Multidetector CT imaging of the chest was performed following the standard protocol without IV contrast. COMPARISON:  12/16/2016, 10/07/2016, CT cervical spine 03/01/2015 FINDINGS:  Cardiovascular: Limited evaluation without intravenous contrast. Aortic atherosclerosis. No aneurysmal dilatation. Coronary artery calcification. Normal heart size. No large pericardial effusion. Mediastinum/Nodes: Negative for mediastinal fluid collection. Enlarged thyroid with 2.2 cm rim calcified mass in the right lobe, unchanged. Midline trachea. No significantly enlarged lymph nodes. Esophagus within normal limits Lungs/Pleura: Tiny right anterior and basilar pneumothorax, less than 10%. Hazy density in the posterior right lung base may reflect atelectasis or mild contusion. Trace pleural fluid on the right. Previously described ground-glass density in the right upper lobe is less apparent on the current exam. Upper Abdomen: No acute abnormality is seen. Surgical clips at the gallbladder fossa Musculoskeletal: Nondisplaced right ninth posterior rib fracture. Comminuted right posterior tenth rib fracture with bone fragment impinging on the pleural surface. Small subcutaneous emphysema within the posterior lower chest wall soft tissues in the vicinity of the rib fractures. IMPRESSION: 1. Acute right ninth and tenth posterior rib fractures with small right anterior and basilar pneumothorax. No midline shift. Mild hazy density in the posterior right lung base may reflect atelectasis or mild contusion. There is small to moderate soft tissue emphysema within the right posterior chest wall soft tissues in the vicinity of the rib fractures. 2. Stable 2.2 cm rim calcified mass in the right lobe of thyroid 3. The previously described right upper lobe ground-glass nodule is less apparent on the current study. Critical Value/emergent results were called by telephone at the time of interpretation on 12/16/2016 at 10:04 pm to Dr. Okey Regal , who verbally acknowledged these results. Aortic Atherosclerosis (ICD10-I70.0). Electronically Signed   By: Donavan Foil M.D.   On: 12/16/2016 22:04   Ct Cervical Spine Wo  Contrast  Result Date: 12/16/2016 CLINICAL DATA:  71 y/o  F; fall in shower with neck pain. EXAM: CT CERVICAL SPINE WITHOUT CONTRAST TECHNIQUE: Multidetector CT imaging of the cervical spine was performed without intravenous contrast. Multiplanar CT image reconstructions were also generated. COMPARISON:  03/01/2015 CT of the cervical spine. FINDINGS: Alignment: Mild stable reversal of cervical curvature from the C3 through C5 levels. No listhesis. Skull base and vertebrae: No acute fracture or dislocation is identified. Anterior cervical discectomy and fusion at C5-6 with interbody fusion. Soft tissues and spinal canal: No prevertebral fluid or swelling. No visible canal hematoma. Disc levels: Mild C4-5 and C6-7 discogenic degenerative changes. Prominent productive changes of the anterior C1-2 articulation. Upper chest: Trace right apical pneumothorax is partially visualized. Please refer to the concurrent CT of the chest for further evaluation of lungs and mediastinum. Other: Multinodular thyroid goiter with the largest nodule in the right lobe of thyroid measuring up to 22 mm. Nodules are stable from prior CT of the cervical spine. Minimal calcific atherosclerosis of carotid bifurcations. IMPRESSION: 1. No acute fracture or dislocation of the cervical spine identified. 2. Trace right apical pneumothorax is partially visualized. Please refer to the concurrent CT of chest for further evaluation of lungs and mediastinum. These results were called by telephone at the time of interpretation on 12/16/2016 at 10:01 pm to Dr. Okey Regal , who verbally acknowledged these results. Electronically Signed   By: Kristine Garbe M.D.   On: 12/16/2016 22:02    Anti-infectives: Anti-infectives  None      Assessment/Plan: s/p  Pain control  Transfer to the floor with telemetry  LOS: 0 days   Kathryne Eriksson. Dahlia Bailiff, MD, FACS [phone] Trauma Surgeon 12/17/2016

## 2016-12-17 NOTE — Care Management Obs Status (Signed)
[facility] NOTIFICATION   Patient Details  Name: Krista Austin MRN: [id number] Date of Birth: [birth year]   Medicare Observation Status Notification Given:  Yes    Ella Bodo, RN 12/17/2016, 2:49 PM

## 2016-12-17 NOTE — Progress Notes (Signed)
Offered Pt a bath. Pt stated "not right now, would like to wait because I did not get any sleep last night". Tech asked Pt to notify her when she is ready. Will recheck for bath.

## 2016-12-17 NOTE — Care Management Note (Signed)
Case Management Note  Patient Details  Name: Krista Austin MRN: [id number] Date of Birth: 04-Sep-1945  Subjective/Objective:  Pt admitted on 12/17/16 s/p fall with rib fx and small PTX.  PTA, pt independent, lives at home with daughter, son in law and two grandchildren.  She also is primary caregiver for her adult son with Downs Syndrome.                   Action/Plan: PT recommending HH follow up; OT consult pending.  Pt agreeable to [facility] follow up at dc; prefers [facility] for [facility] needs.  Will refer to [facility] for [facility] services; start of care 24-48h post dc date.    Expected Discharge Date:                  Expected Discharge Plan:  [place]  In-House Referral:     Discharge planning Services  CM Consult  Post Acute Care Choice:  Home Health Choice offered to:  Patient  DME Arranged:    DME Agency:     HH Arranged:  PT [place]:  [place]  Status of Service:  In process, will continue to follow  If discussed at Long Length of Stay Meetings, dates discussed:    Additional Comments:  Reinaldo Raddle, RN, BSN  Trauma/Neuro ICU Case Manager [phone]

## 2016-12-17 NOTE — Progress Notes (Signed)
1500 Received pt from [place] via bed. A&O x4, pleasant and cooperative.  Pain to right rib cage controlled.

## 2016-12-17 NOTE — H&P (Signed)
[facility]  [address] [place], [place] [address] Phone: [phone] FAX: [place]  1946/04/21 [id number]  CARE TEAM:  PCP: Blair Heys, PA-C  Outpatient Care Team: Patient Care Team: Elayne Guerin as PCP - General (Physician Assistant)  Inpatient Treatment Team: Treatment Team: Attending Provider: Md, Trauma, MD; Technician: Yetta Barre, NT; Physician Assistant: Okey Regal, PA-C; Technician: Felipa Emory, NT; Registered Nurse: Tamera Reason, RN; Physician Assistant: Delaine Lame; Consulting Physician: Md, Trauma, MD; Consulting Physician: Erroll Luna, MD   This patient is a 71 y.o.female who presents today for surgical evaluation at the request of Okey Regal, PA-C.   Chief complaint / Reason for evaluation: follow-through fractures and pneumothorax  Pleasant 71 year old female.  Former smoker.  Quit two months ago.  Was taking a shower.  She slipped in the bathtub.  She landed hard on her right side.  A sharp pain on her chest wall and ribs.  Persistent pain.  Some discomfort in neck and lower back.  Head and neck fusion done in 2015.  She is concerned that her neck may been injured.  Worsening bruising on her ribs as well.  Given sentinel on route to the hospital.  Arise some hypoxia. Obvious rib fractures. Head and neck films underwhelming.  See history of present illness chest confirms two fractures and small apical new or thorax.  Trauma consult requested.  Patient satting around 93% on room air.  Not able to take deep breaths.  Morphine did not work but dilated is controlling her pain now.  Daughter at bedside.  She denies any cardiopulmonary issues.  Had workup for chest pain that was completely negative and found to be more related to cholecystitis.  Tolerated cholecystectomy without difficulty three years ago.  She can walk 30 minutes without difficulty.   She takes aspirin but no other blood thinners.  No other major health issues.   Assessment  Krista Austin  71 y.o. female       Problem List:  Principal Problem:   Pneumothorax on right Active Problems:   Closed fracture of 9th & 10th ribs of right side   Fall onto her right side with two rib fractures and small pneumothorax.  Mild hypoxia but no dyspnea.  Plan:  Admit to trauma service for observation.    Pain control.  Serial exams and chest x-rays.  Hopefully the pneumothorax is small enough that it will resolve on its own.  We will add oxygen supplementation.   Because of her fall, may benefit from physical and occupational therapy evaluation to make sure there are no issues or need for home health care.  Following control hypertension.  GERD-PPI  -VTE prophylaxis- SCDs, etc -mobilize as tolerated to help recovery  45 minutes spent in review, evaluation, examination, counseling, and coordination of care.  More than 50% of that time was spent in counseling.  Adin Hector, M.D., F.A.C.S. Gastrointestinal and Minimally Invasive Surgery [facility] [address], [place], [place] [address] [phone] Main / Paging   12/17/2016      Past Medical History:  Diagnosis Date  . Adenomatous polyp   . Arthritis    "mostly fingers" (10/07/2016)  . At high risk for falls   . 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 Chronic back pain    "  worse in my neck; lower back also affected" (10/07/2016)  . Complication of anesthesia    "never fully regained my taste after my cervical fusion; I don't take much RX so I'm very sensitive to any sedation" (10/07/2016)  . Concussion    X2  . DDD (degenerative disc disease), cervical    lumbar  . Depression    "hx; take Prozac to keep me stable" (10/07/2016)  . Dizziness    caused by compression of cervical disc  . Family history of adverse  reaction to anesthesia    "daughters get PONV; one daughter breaks out severely; grandson got suspended in a state of anxiousness after kidney surgery; had to be held for hours"  . GERD (gastroesophageal reflux disease)   . Hyperlipidemia   . OSA on CPAP   . Pneumonia    "once as an adult" (10/07/2016)  . Thyroid nodule   . Type II diabetes mellitus (HCC)    controlled by diet and exercide    Past Surgical History:  Procedure Laterality Date  . ANTERIOR CERVICAL DECOMP/DISCECTOMY FUSION  2015  . BACK SURGERY    . CARPAL TUNNEL RELEASE Bilateral   . CATARACT EXTRACTION, BILATERAL Bilateral 09/10/2014 - 09/24/2014   by Timothy Bevis  . CHOLECYSTECTOMY N/A 10/16/2016   Procedure: LAPAROSCOPIC CHOLECYSTECTOMY;  Surgeon: Tsuei, Matthew, MD;  Location: [facility] OR;  Service: General;  Laterality: N/A;  . LAPAROSCOPIC CHOLECYSTECTOMY  10/16/2016  . LUMBAR DISC SURGERY  2007  . TONSILLECTOMY  05/05/1983  . TUBAL LIGATION  02/09/1979    Social History   Social History  . Marital status: Divorced    Spouse name: N/A  . Number of children: N/A  . Years of education: N/A   Occupational History  . Not on file.   Social History Main Topics  . Smoking status: Former Smoker    Packs/day: 0.25    Years: 50.00    Types: Cigarettes    Quit date: 10/16/2016  . Smokeless tobacco: Never Used     Comment: 10/16/2016 "weaned down the past 9 days; on RX now; done!!!!!!!!"  . Alcohol use No  . Drug use: No  . Sexual activity: Not Currently   Other Topics Concern  . Not on file   Social History Narrative  . No narrative on file    Family History  Problem Relation Age of Onset  . Mitral valve prolapse Mother   . Hyperlipidemia Mother   . Macular degeneration Mother   . Hypothyroidism Mother   . Arthritis Mother   . Atrial fibrillation Father   . COPD Father   . Osteoarthritis Sister   . Hypothyroidism Sister   . Hyperlipidemia Sister   . Dementia Maternal Grandmother   . Breast cancer  Maternal Grandmother   . Hypertension Maternal Grandmother   . Arthritis Maternal Grandmother   . Diabetes type I Maternal Grandfather   . Arthritis Sister   . Hypothyroidism Sister   . Fibromyalgia Sister   . Other Other        Osgood-schlatter    Current Facility-Administered Medications  Medication Dose Route Frequency Provider Last Rate Last Dose  . acetaminophen (TYLENOL) tablet 1,000 mg  1,000 mg Oral TID Gross, Steven, MD      . alum & mag hydroxide-simeth (MAALOX/MYLANTA) 200-200-20 MG/5ML suspension 30 mL  30 mL Oral Q6H PRN Gross, Steven, MD      . bisacodyl (DULCOLAX) suppository 10 mg  10 mg Rectal Q12H PRN Gross, Steven, MD      .   diphenhydrAMINE (BENADRYL) capsule 25 mg  25 mg Oral Q6H PRN Gross, Steven, MD      . diphenhydrAMINE (BENADRYL) injection 12.5-25 mg  12.5-25 mg Intravenous Q6H PRN Gross, Steven, MD      . enoxaparin (LOVENOX) injection 40 mg  40 mg Subcutaneous QHS Gross, Steven, MD      . gabapentin (NEURONTIN) capsule 300 mg  300 mg Oral BID Gross, Steven, MD      . guaiFENesin-dextromethorphan (ROBITUSSIN DM) 100-10 MG/5ML syrup 10 mL  10 mL Oral Q4H PRN Gross, Steven, MD      . hydrALAZINE (APRESOLINE) injection 5-20 mg  5-20 mg Intravenous Q6H PRN Gross, Steven, MD      . hydrocortisone (ANUSOL-HC) 2.5 % rectal cream 1 application  1 application Topical QID PRN Gross, Steven, MD      . hydrocortisone cream 1 % 1 application  1 application Topical TID PRN Gross, Steven, MD      . HYDROmorphone (DILAUDID) injection 0.5-2 mg  0.5-2 mg Intravenous Q1H PRN Gross, Steven, MD      . ibuprofen (ADVIL,MOTRIN) tablet 400-800 mg  400-800 mg Oral Q6H PRN Gross, Steven, MD      . lactated ringers bolus 1,000 mL  1,000 mL Intravenous Q8H PRN Gross, Steven, MD      . lip balm (CARMEX) ointment 1 application  1 application Topical BID Gross, Steven, MD      . magic mouthwash  15 mL Oral QID PRN Gross, Steven, MD      . menthol-cetylpyridinium (CEPACOL) lozenge 3 mg  1  lozenge Oral PRN Gross, Steven, MD      . methocarbamol (ROBAXIN) 1,000 mg in dextrose 5 % 50 mL IVPB  1,000 mg Intravenous Q6H PRN Gross, Steven, MD      . methocarbamol (ROBAXIN) tablet 1,000 mg  1,000 mg Oral Q6H PRN Gross, Steven, MD      . metoprolol tartrate (LOPRESSOR) injection 5 mg  5 mg Intravenous Q6H PRN Gross, Steven, MD      . metoprolol tartrate (LOPRESSOR) tablet 12.5 mg  12.5 mg Oral Q12H PRN Gross, Steven, MD      . neomycin-bacitracin-polymyxin (NEOSPORIN) ointment 1 application  1 application Topical Daily Gross, Steven, MD      . ondansetron (ZOFRAN) injection 4 mg  4 mg Intravenous Q6H PRN Gross, Steven, MD       Or  . ondansetron (ZOFRAN) 8 mg in sodium chloride 0.9 % 50 mL IVPB  8 mg Intravenous Q6H PRN Gross, Steven, MD      . oxyCODONE (Oxy IR/ROXICODONE) immediate release tablet 5-10 mg  5-10 mg Oral Q4H PRN Gross, Steven, MD      . phenol (CHLORASEPTIC) mouth spray 1-2 spray  1-2 spray Mouth/Throat PRN Gross, Steven, MD      . polyethylene glycol (MIRALAX / GLYCOLAX) packet 17 g  17 g Oral Q12H PRN Gross, Steven, MD      . prochlorperazine (COMPAZINE) injection 5-10 mg  5-10 mg Intravenous Q4H PRN Gross, Steven, MD      . psyllium (HYDROCIL/METAMUCIL) packet 1 packet  1 packet Oral Daily Gross, Steven, MD      . vitamin C (ASCORBIC ACID) tablet 500 mg  500 mg Oral BID Gross, Steven, MD       Current Outpatient Prescriptions  Medication Sig Dispense Refill  . aspirin EC 81 MG tablet Take 81 mg by mouth at bedtime.     . buPROPion (WELLBUTRIN SR) 150 MG 12 hr tablet   Take 150 mg by mouth See admin instructions. Take 1 tablet (150 mg) by mouth once daily x3 days then increase to twice daily    . Calcium-Phosphorus-Vitamin D (CITRACAL +D3 PO) Take 1 tablet by mouth at bedtime.    . cetirizine (ZYRTEC) 10 MG tablet Take 10 mg by mouth at bedtime.    . CINNAMON PO Take 2 capsules by mouth daily.    . FLUoxetine (PROZAC) 40 MG capsule Take 40 mg by mouth at bedtime.     .  ibuprofen (ADVIL,MOTRIN) 200 MG tablet Take 400-800 mg by mouth every 8 (eight) hours as needed (for pain.).    . lisinopril (PRINIVIL,ZESTRIL) 10 MG tablet Take 10 mg by mouth daily.    . Multiple Vitamins-Minerals (MULTIVITAMIN WITH MINERALS) tablet Take 1 tablet by mouth at bedtime.     . Omega-3 Fatty Acids (FISH OIL PO) Take 2 capsules by mouth daily.    . Omega-3 Fatty Acids (OMEGA-3 PO) Take 540 mg by mouth at bedtime.    . pantoprazole (PROTONIX) 40 MG tablet Take 40 mg by mouth daily.    . rosuvastatin (CRESTOR) 10 MG tablet Take 10 mg by mouth at bedtime. Reported on 09/18/2015  5  . hydrocortisone cream 1 % Apply 1 application topically daily as needed for itching.       Allergies  Allergen Reactions  . Lactose Intolerance (Gi) Diarrhea  . Latex Rash    ROS:   All other systems reviewed & are negative except per HPI or as noted below: Constitutional:  No fevers, chills, sweats.  Weight stable Eyes:  No vision changes, No discharge HENT:  No sore throats, nasal drainage Lymph: No neck swelling, No bruising easily Pulmonary:  No cough, productive sputum.  Chest pain to deep breath CV: No orthopnea, PND  Patient walks 20 minutes for about 1 miles without difficulty.  No exertional chest/neck/shoulder/arm pain. GI:  No personal nor family history of GI/colon cancer, inflammatory bowel disease, irritable bowel syndrome, allergy such as Celiac Sprue, dietary/dairy problems, colitis, ulcers nor gastritis.  No recent sick contacts/gastroenteritis.  No travel outside the country.  No changes in diet. Renal: No UTIs, No hematuria Genital:  No drainage, bleeding, masses Musculoskeletal: No severe joint pain.  Good ROM major joints Skin:  No sores or lesions.  No rashes Heme/Lymph:  No easy bleeding.  No swollen lymph nodes Neuro: No focal weakness/numbness.  No seizures Psych: No suicidal ideation.  No hallucinations  BP 123/71 (BP Location: Left Arm)   Pulse 79   Temp 98.9 F (37.2  C) (Oral)   Resp 16   Ht 5' 5" (1.651 m)   Wt 70.3 kg (155 lb)   SpO2 93%   BMI 25.79 kg/m   Physical Exam: General: Pt awake/alert/oriented x4 in no major acute distress Eyes: PERRL, normal EOM. Sclera nonicteric Neuro: CN II-XII intact w/o focal sensory/motor deficits. Lymph: No head/neck/groin lymphadenopathy Psych:  No delerium/psychosis/paranoia HENT: Normocephalic, Mucus membranes moist.  No thrush Neck: Supple, No tracheal deviation Chest: No pain.  Fair respiratory excursion.  Clear to auscultation bilaterally.  Confirmed on right lateral chest wall.  No obvious step-offs.  Some ecchymosis CV:  Pulses intact.  Regular rhythm Abdomen: Soft, Nondistended.  Nontender.  No incarcerated hernias.  Well-healed laparoscopic incisions consistent with prior cholecystectomy Gen:  No inguinal hernias.  No inguinal lymphadenopathy.   Ext:  SCDs BLE.  No significant edema.  No cyanosis Skin: No petechiae / purpurea.  No major sores Musculoskeletal:   No severe joint pain.  Good ROM major joints   Results:   Labs: Results for orders placed or performed during the hospital encounter of 12/16/16 (from the past 48 hour(s))  CBC with Differential     Status: Abnormal   Collection Time: 12/16/16 10:40 PM  Result Value Ref Range   WBC 12.9 (H) 4.0 - 10.5 K/uL   RBC 4.65 3.87 - 5.11 MIL/uL   Hemoglobin 14.6 12.0 - 15.0 g/dL   HCT 41.0 36.0 - 46.0 %   MCV 88.2 78.0 - 100.0 fL   MCH 31.4 26.0 - 34.0 pg   MCHC 35.6 30.0 - 36.0 g/dL   RDW 13.4 11.5 - 15.5 %   Platelets 198 150 - 400 K/uL   Neutrophils Relative % 85 %   Neutro Abs 11.0 (H) 1.7 - 7.7 K/uL   Lymphocytes Relative 10 %   Lymphs Abs 1.2 0.7 - 4.0 K/uL   Monocytes Relative 5 %   Monocytes Absolute 0.7 0.1 - 1.0 K/uL   Eosinophils Relative 0 %   Eosinophils Absolute 0.0 0.0 - 0.7 K/uL   Basophils Relative 0 %   Basophils Absolute 0.0 0.0 - 0.1 K/uL  Basic metabolic panel     Status: Abnormal   Collection Time: 12/16/16 10:40  PM  Result Value Ref Range   Sodium 142 135 - 145 mmol/L   Potassium 4.3 3.5 - 5.1 mmol/L   Chloride 107 101 - 111 mmol/L   CO2 27 22 - 32 mmol/L   Glucose, Bld 157 (H) 65 - 99 mg/dL   BUN 25 (H) 6 - 20 mg/dL   Creatinine, Ser 1.01 (H) 0.44 - 1.00 mg/dL   Calcium 9.6 8.9 - 10.3 mg/dL   GFR calc non Af Amer 55 (L) >60 mL/min   GFR calc Af Amer >60 >60 mL/min    Comment: (NOTE) The eGFR has been calculated using the CKD EPI equation. This calculation has not been validated in all clinical situations. eGFR's persistently <60 mL/min signify possible Chronic Kidney Disease.    Anion gap 8 5 - 15    Imaging / Studies: Dg Ribs Unilateral W/chest Right  Result Date: 12/16/2016 CLINICAL DATA:  Fall.  Right lateral chest pain and lower rib pain. EXAM: RIGHT RIBS AND CHEST - 3+ VIEW COMPARISON:  10/07/2016 FINDINGS: There is an acute fracture involving the anterior aspect of the right ninth rib. No additional fractures identified. The lungs are clear. Heart size is normal. No pleural effusion or edema. IMPRESSION: 1. Acute fracture involves the anterior aspect of the right ninth rib. Electronically Signed   By: Taylor  Stroud M.D.   On: 12/16/2016 19:20   Dg Cervical Spine Complete  Result Date: 12/16/2016 CLINICAL DATA:  Fell in bathtub, with neck pain EXAM: CERVICAL SPINE - COMPLETE 4+ VIEW COMPARISON:  02/29/2012, 02/01/2012 FINDINGS: Suboptimal visualization of C7. Status post anterior plate and screw fixation at C5 and C6 with interbody disc devices and solid bone fusion present. Straightening. Upper vertebral bodies are maintained. Moderate degenerative changes at C4-C5. Lung apices clear. Dens and lateral masses are within normal limits. IMPRESSION: 1. Inadequate visualization of C7 2. Postsurgical changes at C5 and C6. Degenerative changes. No other specific abnormalities are seen. Electronically Signed   By: Kim  Fujinaga M.D.   On: 12/16/2016 19:22   Dg Thoracic Spine 2 View  Result  Date: 12/16/2016 CLINICAL DATA:  Fall EXAM: THORACIC SPINE 2 VIEWS COMPARISON:  None. FINDINGS: There is no evidence of thoracic   spine fracture. Alignment is normal. No other significant bone abnormalities are identified. Lower cervical ACDF. IMPRESSION: No compression fracture or static subluxation of the thoracic spine. Electronically Signed   By: Kevin  Herman M.D.   On: 12/16/2016 19:21   Dg Lumbar Spine Complete  Result Date: 12/16/2016 CLINICAL DATA:  Status post fall. EXAM: LUMBAR SPINE - COMPLETE 4+ VIEW COMPARISON:  None FINDINGS: There is a curvature the lumbar spine which is convex ports the left. The vertebral body heights are well preserved. Disc space narrowing and ventral endplate spurring is noted throughout the lumbar spine. This is most advanced at L2-3. Aortic atherosclerosis identified. IMPRESSION: 1. Mild curvature of the thoracic scratch set mild scoliosis and moderate degenerative disc disease Electronically Signed   By: Taylor  Stroud M.D.   On: 12/16/2016 19:25   Ct Chest Wo Contrast  Result Date: 12/16/2016 CLINICAL DATA:  Fell and landed on right side, thoracic and rib pain EXAM: CT CHEST WITHOUT CONTRAST TECHNIQUE: Multidetector CT imaging of the chest was performed following the standard protocol without IV contrast. COMPARISON:  12/16/2016, 10/07/2016, CT cervical spine 03/01/2015 FINDINGS: Cardiovascular: Limited evaluation without intravenous contrast. Aortic atherosclerosis. No aneurysmal dilatation. Coronary artery calcification. Normal heart size. No large pericardial effusion. Mediastinum/Nodes: Negative for mediastinal fluid collection. Enlarged thyroid with 2.2 cm rim calcified mass in the right lobe, unchanged. Midline trachea. No significantly enlarged lymph nodes. Esophagus within normal limits Lungs/Pleura: Tiny right anterior and basilar pneumothorax, less than 10%. Hazy density in the posterior right lung base may reflect atelectasis or mild contusion. Trace  pleural fluid on the right. Previously described ground-glass density in the right upper lobe is less apparent on the current exam. Upper Abdomen: No acute abnormality is seen. Surgical clips at the gallbladder fossa Musculoskeletal: Nondisplaced right ninth posterior rib fracture. Comminuted right posterior tenth rib fracture with bone fragment impinging on the pleural surface. Small subcutaneous emphysema within the posterior lower chest wall soft tissues in the vicinity of the rib fractures. IMPRESSION: 1. Acute right ninth and tenth posterior rib fractures with small right anterior and basilar pneumothorax. No midline shift. Mild hazy density in the posterior right lung base may reflect atelectasis or mild contusion. There is small to moderate soft tissue emphysema within the right posterior chest wall soft tissues in the vicinity of the rib fractures. 2. Stable 2.2 cm rim calcified mass in the right lobe of thyroid 3. The previously described right upper lobe ground-glass nodule is less apparent on the current study. Critical Value/emergent results were called by telephone at the time of interpretation on 12/16/2016 at 10:04 pm to Dr. JEFFREY HEDGES , who verbally acknowledged these results. Aortic Atherosclerosis (ICD10-I70.0). Electronically Signed   By: Kim  Fujinaga M.D.   On: 12/16/2016 22:04   Ct Cervical Spine Wo Contrast  Result Date: 12/16/2016 CLINICAL DATA:  71 y/o  F; fall in shower with neck pain. EXAM: CT CERVICAL SPINE WITHOUT CONTRAST TECHNIQUE: Multidetector CT imaging of the cervical spine was performed without intravenous contrast. Multiplanar CT image reconstructions were also generated. COMPARISON:  03/01/2015 CT of the cervical spine. FINDINGS: Alignment: Mild stable reversal of cervical curvature from the C3 through C5 levels. No listhesis. Skull base and vertebrae: No acute fracture or dislocation is identified. Anterior cervical discectomy and fusion at C5-6 with interbody fusion.  Soft tissues and spinal canal: No prevertebral fluid or swelling. No visible canal hematoma. Disc levels: Mild C4-5 and C6-7 discogenic degenerative changes. Prominent productive changes of the anterior C1-2 articulation.   Upper chest: Trace right apical pneumothorax is partially visualized. Please refer to the concurrent CT of the chest for further evaluation of lungs and mediastinum. Other: Multinodular thyroid goiter with the largest nodule in the right lobe of thyroid measuring up to 22 mm. Nodules are stable from prior CT of the cervical spine. Minimal calcific atherosclerosis of carotid bifurcations. IMPRESSION: 1. No acute fracture or dislocation of the cervical spine identified. 2. Trace right apical pneumothorax is partially visualized. Please refer to the concurrent CT of chest for further evaluation of lungs and mediastinum. These results were called by telephone at the time of interpretation on 12/16/2016 at 10:01 pm to Dr. Okey Regal , who verbally acknowledged these results. Electronically Signed   By: Kristine Garbe M.D.   On: 12/16/2016 22:02    Medications / Allergies: per chart  Antibiotics: Anti-infectives    None        Note: Portions of this report may have been transcribed using voice recognition software. Every effort was made to ensure accuracy; however, inadvertent computerized transcription errors may be present.   Any transcriptional errors that result from this process are unintentional.    Adin Hector, M.D., F.A.C.S. Gastrointestinal and Minimally Invasive Surgery [facility] [address], [place], [place] [address] [phone] Main / Paging   12/17/2016

## 2016-12-17 NOTE — Evaluation (Signed)
Physical Therapy Evaluation Patient Details Name: Krista Austin MRN: [id number] DOB: November 03, 1945 Today's Date: 12/17/2016   History of Present Illness  71 yo admitted with fall in tub with right 9-10 rib fx and PTX: head and neck fusion 2015, cholecystectomy, DDD  Clinical Impression  Upon arrival pt standing up beside bed attempting to untangle lines. PT asked pt to sit EOB and educated not to get up on her own. 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 Pt also reports she has been taking care of her handicapped son and they live in a house with relatives that are able to assist. Pt able to mobilize well without physical assistance; however mobility is limited due to significant dizziness and nausea. Pt presents with deficits listed in PT problem list below and feel pt will be safe to go home with family assistance; however HHPT recommended for safety screen and fall prevention education in home environment.   Vitals:  Pre ambulation HR 78 SpO2 97% (decreased from 2L O2 to RA with O2 remaining at 97%)   Post ambulation HR 80 SpO2 92%     Follow Up Recommendations Home health PT    Equipment Recommendations  None recommended by PT    Recommendations for Other Services OT consult     Precautions / Restrictions Precautions Precautions: Fall Precaution Comments: pt reports 5 falls in last 2 years with having seen OPPT with assessment of cervicogenic dizziness Restrictions Weight Bearing Restrictions: No      Mobility  Bed Mobility               General bed mobility comments: EOB on arrival   Transfers Overall transfer level: Needs assistance   Transfers: Sit to/from Stand Sit to Stand: Supervision         General transfer comment: Supervision for safety   Ambulation/Gait Ambulation/Gait assistance: Supervision Ambulation Distance  (Feet): 250 Feet Assistive device: Rolling walker (2 wheeled) Gait Pattern/deviations: Decreased stride length;Step-through pattern Gait velocity: decreased Gait velocity interpretation: Below normal speed for age/gender General Gait Details: Pt able to ambulate with supervision with use of RW with decreased gait speed and decreased stride length. No cues needed; however limited due to dizziness and nausea  Stairs            Wheelchair Mobility    Modified Rankin (Stroke Patients Only)       Balance Overall balance assessment: Needs assistance;History of Falls   Sitting balance-Leahy Scale: Good     Standing balance support: Bilateral upper extremity supported;During functional activity Standing balance-Leahy Scale: Fair                               Pertinent Vitals/Pain Pain Assessment: 0-10 Pain Score: 8  Pain Location: right side Pain Descriptors / Indicators: Sore;Aching Pain Intervention(s): Limited activity within patient's tolerance;RN gave pain meds during session;Monitored during session    [place] expects to be discharged to:: Private residence Living Arrangements: Children Available Help at Discharge: Family;Available 24 hours/day Type of Home: House Home Access: Level entry     Home Layout: Two level;Able to live on main level with bedroom/bathroom Home Equipment: Gilford Rile - 2 wheels;Walker - 4 wheels;Shower seat;Bedside commode;Wheelchair - manual;Other (comment) (stander, stair lift)      Prior Function Level of Independence: Independent  Comments: 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     Hand Dominance        Extremity/Trunk Assessment   Upper Extremity Assessment Upper Extremity Assessment: Overall WFL for tasks assessed    Lower Extremity Assessment Lower Extremity Assessment: Overall WFL for tasks assessed    Cervical / Trunk Assessment Cervical / Trunk Assessment:  Other exceptions Cervical / Trunk Exceptions: forward head  Communication   Communication: No difficulties  Cognition Arousal/Alertness: Awake/alert Behavior During Therapy: WFL for tasks assessed/performed Overall Cognitive Status: Within Functional Limits for tasks assessed                                        General Comments      Exercises     Assessment/Plan    PT Assessment Patient needs continued PT services  PT Problem List Decreased mobility;Decreased safety awareness;Decreased activity tolerance;Pain;Decreased balance;Decreased knowledge of use of DME       PT Treatment Interventions Gait training;Patient/family education;Balance training;Stair training;Functional mobility training;DME instruction;Therapeutic activities    PT Goals (Current goals can be found in the Care Plan section)  Acute Rehab PT Goals Patient Stated Goal: decrease falls and dizziness  PT Goal Formulation: With patient Time For Goal Achievement: 12/31/16 Potential to Achieve Goals: Good    Frequency Min 3X/week   Barriers to discharge        Co-evaluation               AM-PAC PT "6 Clicks" Daily Activity  Outcome Measure Difficulty turning over in bed (including adjusting bedclothes, sheets and blankets)?: None Difficulty moving from lying on back to sitting on the side of the bed? : None Difficulty sitting down on and standing up from a chair with arms (e.g., wheelchair, bedside commode, etc,.)?: None Help needed moving to and from a bed to chair (including a wheelchair)?: None Help needed walking in hospital room?: A Little Help needed climbing 3-5 steps with a railing? : A Little 6 Click Score: 22    End of Session Equipment Utilized During Treatment: Gait belt Activity Tolerance: Patient tolerated treatment well Patient left: in chair;with chair alarm set;with call bell/phone within reach Nurse Communication: Mobility status PT Visit Diagnosis:  Difficulty in walking, not elsewhere classified (R26.2);History of falling (Z91.81)    Time: 5726-2035 PT Time Calculation (min) (ACUTE ONLY): 30 min   Charges:   PT Evaluation $PT Eval Moderate Complexity: 1 Mod PT Treatments $Gait Training: 8-22 mins   PT G Codes:   PT G-Codes **NOT FOR INPATIENT CLASS** Functional Assessment Tool Used: Clinical judgement Functional Limitation: Mobility: Walking and moving around Mobility: Walking and Moving Around Current Status (D9741): At least 1 percent but less than 20 percent impaired, limited or restricted Mobility: Walking and Moving Around Goal Status [phone]): 0 percent impaired, limited or restricted    Elberta Leatherwood, [place] [facility] 12/17/2016, 9:47 AM

## 2016-12-17 NOTE — ED Provider Notes (Signed)
Trauma will admit.  Tx to [facility].   Montine Circle, PA-C 12/17/16 [phone]

## 2016-12-17 NOTE — ED Notes (Signed)
Phone report called to [place], Therapist, sports.  [facility] called for transport

## 2016-12-17 NOTE — ED Notes (Signed)
Patients daughter - Jolinda Croak [phone]

## 2016-12-18 ENCOUNTER — Encounter (HOSPITAL_COMMUNITY): Payer: Self-pay | Admitting: *Deleted

## 2016-12-18 MED ORDER — HYDROMORPHONE HCL 1 MG/ML IJ SOLN: 0.5000 mg | Freq: Four times a day (QID) | INTRAMUSCULAR | Status: DC | PRN

## 2016-12-18 MED ORDER — METHOCARBAMOL 500 MG PO TABS: 500.0000 mg | ORAL_TABLET | Freq: Three times a day (TID) | ORAL | 0 refills | Status: DC

## 2016-12-18 MED ORDER — OXYCODONE HCL 5 MG PO TABS: 5.0000 mg | ORAL_TABLET | Freq: Four times a day (QID) | ORAL | 0 refills | Status: DC | PRN

## 2016-12-18 MED ORDER — IBUPROFEN 400 MG PO TABS
400.0000 mg | ORAL_TABLET | Freq: Four times a day (QID) | ORAL | Status: DC
  Administered 2016-12-18: 400 mg via ORAL
  Filled 2016-12-18: qty 1

## 2016-12-18 MED ORDER — ACETAMINOPHEN 500 MG PO TABS: 500.0000 mg | ORAL_TABLET | ORAL | 0 refills | Status: AC

## 2016-12-18 MED ORDER — IBUPROFEN 400 MG PO TABS: 400.0000 mg | ORAL_TABLET | Freq: Four times a day (QID) | ORAL | 0 refills | Status: DC

## 2016-12-18 MED ORDER — BACITRACIN-NEOMYCIN-POLYMYXIN OINTMENT TUBE
1.0000 | TOPICAL_OINTMENT | Freq: Every day | CUTANEOUS | Status: DC
Start: 2016-12-18 — End: 2017-06-15

## 2016-12-18 NOTE — Progress Notes (Signed)
Patient ID: Krista Austin, female   DOB: October 07, 1945, 71 y.o.   MRN: [id number]  [facility] Surgery Progress Note     Subjective: CC- rib pain Patient states that she slept well last night, feeling better today than yesterday. Continues to have some pain in her ribs, but it is manageable. Denies SOB. Worked well with PT yesterday, planning for [facility] PT. Tolerating diet.  Objective: Vital signs in last 24 hours: Temp:  [97.8 F (36.6 C)-98.5 F (36.9 C)] 98.3 F (36.8 C) (08/17 0624) Pulse Rate:  [69-84] 71 (08/17 0624) Resp:  [12-17] 17 (08/17 0624) BP: (100-124)/(57-77) 124/77 (08/17 0624) SpO2:  [94 %-96 %] 96 % (08/17 0624) Last BM Date: 12/16/16  Intake/Output from previous day: 08/16 0701 - 08/17 0700 In: 480 [P.O.:480] Out: -  Intake/Output this shift: No intake/output data recorded.  PE: Gen:  Alert, NAD, pleasant HEENT: EOM's intact, pupils equal and round Card:  RRR, no M/G/R heard Pulm:  CTAB, no W/R/R, effort normal Abd: Soft, NT/ND, +BS, no HSM, no hernia Ext:  No erythema, edema, or tenderness BUE/BLE  Psych: A&Ox3  Skin: no rashes noted, warm and dry  Lab Results:   Recent Labs  12/16/16 2240  WBC 12.9*  HGB 14.6  HCT 41.0  PLT 198   BMET  Recent Labs  12/16/16 2240  NA 142  K 4.3  CL 107  CO2 27  GLUCOSE 157*  BUN 25*  CREATININE 1.01*  CALCIUM 9.6   PT/INR No results for input(s): LABPROT, INR in the last 72 hours. CMP     Component Value Date/Time   NA 142 12/16/2016 2240   K 4.3 12/16/2016 2240   CL 107 12/16/2016 2240   CO2 27 12/16/2016 2240   GLUCOSE 157 (H) 12/16/2016 2240   BUN 25 (H) 12/16/2016 2240   CREATININE 1.01 (H) 12/16/2016 2240   CALCIUM 9.6 12/16/2016 2240   PROT 7.3 10/06/2016 1412   ALBUMIN 4.2 10/06/2016 1412   AST 28 10/06/2016 1412   ALT 22 10/06/2016 1412   ALKPHOS 85 10/06/2016 1412   BILITOT 0.4 10/06/2016 1412   GFRNONAA 55 (L) 12/16/2016 2240   GFRAA >60 12/16/2016 2240   Lipase      Component Value Date/Time   LIPASE 35 10/06/2016 1412       Studies/Results: Dg Ribs Unilateral W/chest Right  Result Date: 12/16/2016 CLINICAL DATA:  Fall.  Right lateral chest pain and lower rib pain. EXAM: RIGHT RIBS AND CHEST - 3+ VIEW COMPARISON:  10/07/2016 FINDINGS: There is an acute fracture involving the anterior aspect of the right ninth rib. No additional fractures identified. The lungs are clear. Heart size is normal. No pleural effusion or edema. IMPRESSION: 1. Acute fracture involves the anterior aspect of the right ninth rib. Electronically Signed   By: Kerby Moors M.D.   On: 12/16/2016 19:20   Dg Cervical Spine Complete  Result Date: 12/16/2016 CLINICAL DATA:  [place] in bathtub, with neck pain EXAM: CERVICAL SPINE - COMPLETE 4+ VIEW COMPARISON:  02/29/2012, 02/01/2012 FINDINGS: Suboptimal visualization of C7. Status post anterior plate and screw fixation at C5 and C6 with interbody disc devices and solid bone fusion present. Straightening. Upper vertebral bodies are maintained. Moderate degenerative changes at C4-C5. Lung apices clear. Dens and lateral masses are within normal limits. IMPRESSION: 1. Inadequate visualization of C7 2. Postsurgical changes at C5 and C6. Degenerative changes. No other specific abnormalities are seen. Electronically Signed   By: Madie Reno.D.  On: 12/16/2016 19:22   Dg Thoracic Spine 2 View  Result Date: 12/16/2016 CLINICAL DATA:  Fall EXAM: THORACIC SPINE 2 VIEWS COMPARISON:  None. FINDINGS: There is no evidence of thoracic spine fracture. Alignment is normal. No other significant bone abnormalities are identified. Lower cervical ACDF. IMPRESSION: No compression fracture or static subluxation of the thoracic spine. Electronically Signed   By: Ulyses Jarred M.D.   On: 12/16/2016 19:21   Dg Lumbar Spine Complete  Result Date: 12/16/2016 CLINICAL DATA:  Status post fall. EXAM: LUMBAR SPINE - COMPLETE 4+ VIEW COMPARISON:  None FINDINGS: There  is a curvature the lumbar spine which is convex ports the left. The vertebral body heights are well preserved. Disc space narrowing and ventral endplate spurring is noted throughout the lumbar spine. This is most advanced at L2-3. Aortic atherosclerosis identified. IMPRESSION: 1. Mild curvature of the thoracic scratch set mild scoliosis and moderate degenerative disc disease Electronically Signed   By: Kerby Moors M.D.   On: 12/16/2016 19:25   Ct Chest Wo Contrast  Result Date: 12/16/2016 CLINICAL DATA:  [place] and landed on right side, thoracic and rib pain EXAM: CT CHEST WITHOUT CONTRAST TECHNIQUE: Multidetector CT imaging of the chest was performed following the standard protocol without IV contrast. COMPARISON:  12/16/2016, 10/07/2016, CT cervical spine 03/01/2015 FINDINGS: Cardiovascular: Limited evaluation without intravenous contrast. Aortic atherosclerosis. No aneurysmal dilatation. Coronary artery calcification. Normal heart size. No large pericardial effusion. Mediastinum/Nodes: Negative for mediastinal fluid collection. Enlarged thyroid with 2.2 cm rim calcified mass in the right lobe, unchanged. Midline trachea. No significantly enlarged lymph nodes. Esophagus within normal limits Lungs/Pleura: Tiny right anterior and basilar pneumothorax, less than 10%. Hazy density in the posterior right lung base may reflect atelectasis or mild contusion. Trace pleural fluid on the right. Previously described ground-glass density in the right upper lobe is less apparent on the current exam. Upper Abdomen: No acute abnormality is seen. Surgical clips at the gallbladder fossa Musculoskeletal: Nondisplaced right ninth posterior rib fracture. Comminuted right posterior tenth rib fracture with bone fragment impinging on the pleural surface. Small subcutaneous emphysema within the posterior lower chest wall soft tissues in the vicinity of the rib fractures. IMPRESSION: 1. Acute right ninth and tenth posterior rib  fractures with small right anterior and basilar pneumothorax. No midline shift. Mild hazy density in the posterior right lung base may reflect atelectasis or mild contusion. There is small to moderate soft tissue emphysema within the right posterior chest wall soft tissues in the vicinity of the rib fractures. 2. Stable 2.2 cm rim calcified mass in the right lobe of thyroid 3. The previously described right upper lobe ground-glass nodule is less apparent on the current study. Critical Value/emergent results were called by telephone at the time of interpretation on 12/16/2016 at 10:04 pm to Dr. Okey Regal , who verbally acknowledged these results. Aortic Atherosclerosis (ICD10-I70.0). Electronically Signed   By: Donavan Foil M.D.   On: 12/16/2016 22:04   Ct Cervical Spine Wo Contrast  Result Date: 12/16/2016 CLINICAL DATA:  71 y/o  F; fall in shower with neck pain. EXAM: CT CERVICAL SPINE WITHOUT CONTRAST TECHNIQUE: Multidetector CT imaging of the cervical spine was performed without intravenous contrast. Multiplanar CT image reconstructions were also generated. COMPARISON:  03/01/2015 CT of the cervical spine. FINDINGS: Alignment: Mild stable reversal of cervical curvature from the C3 through C5 levels. No listhesis. Skull base and vertebrae: No acute fracture or dislocation is identified. Anterior cervical discectomy and fusion at C5-6 with  interbody fusion. Soft tissues and spinal canal: No prevertebral fluid or swelling. No visible canal hematoma. Disc levels: Mild C4-5 and C6-7 discogenic degenerative changes. Prominent productive changes of the anterior C1-2 articulation. Upper chest: Trace right apical pneumothorax is partially visualized. Please refer to the concurrent CT of the chest for further evaluation of lungs and mediastinum. Other: Multinodular thyroid goiter with the largest nodule in the right lobe of thyroid measuring up to 22 mm. Nodules are stable from prior CT of the cervical spine.  Minimal calcific atherosclerosis of carotid bifurcations. IMPRESSION: 1. No acute fracture or dislocation of the cervical spine identified. 2. Trace right apical pneumothorax is partially visualized. Please refer to the concurrent CT of chest for further evaluation of lungs and mediastinum. These results were called by telephone at the time of interpretation on 12/16/2016 at 10:01 pm to Dr. Okey Regal , who verbally acknowledged these results. Electronically Signed   By: Kristine Garbe M.D.   On: 12/16/2016 22:02   Dg Chest Port 1 View  Result Date: 12/17/2016 CLINICAL DATA:  Right rib fracture EXAM: PORTABLE CHEST 1 VIEW COMPARISON:  12/16/2016 FINDINGS: Heart is borderline in size. Mild vascular congestion. Minimal bibasilar atelectasis. No effusions or pneumothorax. IMPRESSION: Mild vascular congestion.  Bibasilar atelectasis. Electronically Signed   By: Rolm Baptise M.D.   On: 12/17/2016 11:28    Anti-infectives: Anti-infectives    None       Assessment/Plan Fall Right rib fractures 9-10 - pulmonary toilet/IS, pain control Right pneumothorax - follow up CXR negative for PNX OSA - CPAP HTN - lisinopril GERD - protonix Chronic back pain  ID - none FEN - regular diet VTE - SCDs, lovenox Foley - none  Plan - schedule tylenol, ibuprofen, robaxin. Continue PT/mobilization/IS this morning. Will plan for discharge home this afternoon. HH PT ordered.   LOS: 1 day    Wellington [place] , [facility] Surgery 12/18/2016, 7:52 AM Pager: [phone] Consults: [phone] Mon-Fri 7:00 am-4:30 pm Sat-Sun 7:00 am-11:30 am

## 2016-12-18 NOTE — Progress Notes (Signed)
Pt was ambulating to the hall with daughter. Pain to right rib cage controlled with oral narcotics.  Discharge instructions given to pt and daughter. Discharged to home accompanied by daughter.

## 2016-12-18 NOTE — Discharge Summary (Signed)
[facility] Discharge Summary   Patient ID: Krista Austin MRN: [id number] DOB/AGE: [birth year] 71 y.o.  Admit date: 12/16/2016 Discharge date: 12/18/2016  Admitting Diagnosis: Fall Right rib fractures 9-10 Right pneumothorax   Discharge Diagnosis Patient Active Problem List   Diagnosis Date Noted  . Closed fracture of 9th & 10th ribs of right side 12/17/2016  . Pneumothorax on right 12/17/2016  . Lumbar degenerative disc disease 12/17/2016  . Essential hypertension 12/17/2016  . Dyslipidemia 10/07/2016  . Pulmonary nodule 10/07/2016  . Abnormal CXR   . Chest pain 10/06/2016  . S/P lumbar spinal fusion 08/05/2016  . Obstructive sleep apnea syndrome, mild 10/27/2013  . Controlled type 2 diabetes mellitus without complication, without long-term current use of insulin ([place]) 06/19/2011  . GAD (generalized anxiety disorder) 11/01/2009    Consultants None  Imaging: Dg Ribs Unilateral W/chest Right  Result Date: 12/16/2016 CLINICAL DATA:  Fall.  Right lateral chest pain and lower rib pain. EXAM: RIGHT RIBS AND CHEST - 3+ VIEW COMPARISON:  10/07/2016 FINDINGS: There is an acute fracture involving the anterior aspect of the right ninth rib. No additional fractures identified. The lungs are clear. Heart size is normal. No pleural effusion or edema. IMPRESSION: 1. Acute fracture involves the anterior aspect of the right ninth rib. Electronically Signed   By: Kerby Moors M.D.   On: 12/16/2016 19:20   Dg Cervical Spine Complete  Result Date: 12/16/2016 CLINICAL DATA:  [place] in bathtub, with neck pain EXAM: CERVICAL SPINE - COMPLETE 4+ VIEW COMPARISON:  02/29/2012, 02/01/2012 FINDINGS: Suboptimal visualization of C7. Status post anterior plate and screw fixation at C5 and C6 with interbody disc devices and solid bone fusion present. Straightening. Upper vertebral bodies are maintained. Moderate degenerative changes at C4-C5. Lung apices clear. Dens and lateral masses are  within normal limits. IMPRESSION: 1. Inadequate visualization of C7 2. Postsurgical changes at C5 and C6. Degenerative changes. No other specific abnormalities are seen. Electronically Signed   By: Donavan Foil M.D.   On: 12/16/2016 19:22   Dg Thoracic Spine 2 View  Result Date: 12/16/2016 CLINICAL DATA:  Fall EXAM: THORACIC SPINE 2 VIEWS COMPARISON:  None. FINDINGS: There is no evidence of thoracic spine fracture. Alignment is normal. No other significant bone abnormalities are identified. Lower cervical ACDF. IMPRESSION: No compression fracture or static subluxation of the thoracic spine. Electronically Signed   By: Ulyses Jarred M.D.   On: 12/16/2016 19:21   Dg Lumbar Spine Complete  Result Date: 12/16/2016 CLINICAL DATA:  Status post fall. EXAM: LUMBAR SPINE - COMPLETE 4+ VIEW COMPARISON:  None FINDINGS: There is a curvature the lumbar spine which is convex ports the left. The vertebral body heights are well preserved. Disc space narrowing and ventral endplate spurring is noted throughout the lumbar spine. This is most advanced at L2-3. Aortic atherosclerosis identified. IMPRESSION: 1. Mild curvature of the thoracic scratch set mild scoliosis and moderate degenerative disc disease Electronically Signed   By: Kerby Moors M.D.   On: 12/16/2016 19:25   Ct Chest Wo Contrast  Result Date: 12/16/2016 CLINICAL DATA:  [place] and landed on right side, thoracic and rib pain EXAM: CT CHEST WITHOUT CONTRAST TECHNIQUE: Multidetector CT imaging of the chest was performed following the standard protocol without IV contrast. COMPARISON:  12/16/2016, 10/07/2016, CT cervical spine 03/01/2015 FINDINGS: Cardiovascular: Limited evaluation without intravenous contrast. Aortic atherosclerosis. No aneurysmal dilatation. Coronary artery calcification. Normal heart size. No large pericardial effusion. Mediastinum/Nodes: Negative for mediastinal fluid collection. Enlarged thyroid  with 2.2 cm rim calcified mass in the right  lobe, unchanged. Midline trachea. No significantly enlarged lymph nodes. Esophagus within normal limits Lungs/Pleura: Tiny right anterior and basilar pneumothorax, less than 10%. Hazy density in the posterior right lung base may reflect atelectasis or mild contusion. Trace pleural fluid on the right. Previously described ground-glass density in the right upper lobe is less apparent on the current exam. Upper Abdomen: No acute abnormality is seen. Surgical clips at the gallbladder fossa Musculoskeletal: Nondisplaced right ninth posterior rib fracture. Comminuted right posterior tenth rib fracture with bone fragment impinging on the pleural surface. Small subcutaneous emphysema within the posterior lower chest wall soft tissues in the vicinity of the rib fractures. IMPRESSION: 1. Acute right ninth and tenth posterior rib fractures with small right anterior and basilar pneumothorax. No midline shift. Mild hazy density in the posterior right lung base may reflect atelectasis or mild contusion. There is small to moderate soft tissue emphysema within the right posterior chest wall soft tissues in the vicinity of the rib fractures. 2. Stable 2.2 cm rim calcified mass in the right lobe of thyroid 3. The previously described right upper lobe ground-glass nodule is less apparent on the current study. Critical Value/emergent results were called by telephone at the time of interpretation on 12/16/2016 at 10:04 pm to Dr. Okey Regal , who verbally acknowledged these results. Aortic Atherosclerosis (ICD10-I70.0). Electronically Signed   By: Donavan Foil M.D.   On: 12/16/2016 22:04   Ct Cervical Spine Wo Contrast  Result Date: 12/16/2016 CLINICAL DATA:  71 y/o  F; fall in shower with neck pain. EXAM: CT CERVICAL SPINE WITHOUT CONTRAST TECHNIQUE: Multidetector CT imaging of the cervical spine was performed without intravenous contrast. Multiplanar CT image reconstructions were also generated. COMPARISON:  03/01/2015 CT of  the cervical spine. FINDINGS: Alignment: Mild stable reversal of cervical curvature from the C3 through C5 levels. No listhesis. Skull base and vertebrae: No acute fracture or dislocation is identified. Anterior cervical discectomy and fusion at C5-6 with interbody fusion. Soft tissues and spinal canal: No prevertebral fluid or swelling. No visible canal hematoma. Disc levels: Mild C4-5 and C6-7 discogenic degenerative changes. Prominent productive changes of the anterior C1-2 articulation. Upper chest: Trace right apical pneumothorax is partially visualized. Please refer to the concurrent CT of the chest for further evaluation of lungs and mediastinum. Other: Multinodular thyroid goiter with the largest nodule in the right lobe of thyroid measuring up to 22 mm. Nodules are stable from prior CT of the cervical spine. Minimal calcific atherosclerosis of carotid bifurcations. IMPRESSION: 1. No acute fracture or dislocation of the cervical spine identified. 2. Trace right apical pneumothorax is partially visualized. Please refer to the concurrent CT of chest for further evaluation of lungs and mediastinum. These results were called by telephone at the time of interpretation on 12/16/2016 at 10:01 pm to Dr. Okey Regal , who verbally acknowledged these results. Electronically Signed   By: Kristine Garbe M.D.   On: 12/16/2016 22:02   Dg Chest Port 1 View  Result Date: 12/17/2016 CLINICAL DATA:  Right rib fracture EXAM: PORTABLE CHEST 1 VIEW COMPARISON:  12/16/2016 FINDINGS: Heart is borderline in size. Mild vascular congestion. Minimal bibasilar atelectasis. No effusions or pneumothorax. IMPRESSION: Mild vascular congestion.  Bibasilar atelectasis. Electronically Signed   By: Rolm Baptise M.D.   On: 12/17/2016 11:28    Procedures None  Hospital Course:  HEYDI SWANGO is a 71yo female who presented to [facility]16 after falling in  the bathtub at home. Landed hard on the right side and immediately had  pain in her chest.  Workup showed two right rib fractures with a small right pneumothorax. Otherwise CT chest and CT cervical spine were without acute injuries.  Patient was admitted to the trauma service at [facility] for pain control and observation. Follow-up chest xray the following day was negative for pneumothorax. patient worked with therapies during this admission. On 8/17 the patient was voiding well, tolerating diet, ambulating well, pain well controlled, vital signs stable and felt stable for discharge home.  Patient will follow up in our office in 2-3 weeks and knows to call with questions or concerns.    I have personally reviewed the patients medication history on the [place] controlled substance database.    Physical Exam: Gen:  Alert, NAD, pleasant HEENT: EOM's intact, pupils equal and round Card:  RRR, no M/G/R heard Pulm:  CTAB, no W/R/R, effort normal Abd: Soft, NT/ND, +BS, no HSM, no hernia Ext:  No erythema, edema, or tenderness BUE/BLE  Psych: A&Ox3  Skin: no rashes noted, warm and dry  Allergies as of 12/18/2016      Reactions   Lactose Intolerance (gi) Diarrhea   Latex Rash      Medication List    TAKE these medications   acetaminophen 500 MG tablet Commonly known as:  TYLENOL Take 1 tablet (500 mg total) by mouth every 4 (four) hours.   aspirin EC 81 MG tablet Take 81 mg by mouth at bedtime.   buPROPion 150 MG 12 hr tablet Commonly known as:  WELLBUTRIN SR Take 150 mg by mouth See admin instructions. Take 1 tablet (150 mg) by mouth once daily x3 days then increase to twice daily   cetirizine 10 MG tablet Commonly known as:  ZYRTEC Take 10 mg by mouth at bedtime.   CINNAMON PO Take 2 capsules by mouth daily.   CITRACAL +D3 PO Take 1 tablet by mouth at bedtime.   FISH OIL PO Take 2 capsules by mouth daily.   FLUoxetine 40 MG capsule Commonly known as:  PROZAC Take 40 mg by mouth at bedtime.   hydrocortisone cream 1 % Apply 1 application topically  daily as needed for itching.   ibuprofen 400 MG tablet Commonly known as:  ADVIL,MOTRIN Take 1 tablet (400 mg total) by mouth every 6 (six) hours. What changed:  medication strength  how much to take  when to take this  reasons to take this   lisinopril 10 MG tablet Commonly known as:  PRINIVIL,ZESTRIL Take 10 mg by mouth daily.   methocarbamol 500 MG tablet Commonly known as:  ROBAXIN Take 1 tablet (500 mg total) by mouth 3 (three) times daily.   multivitamin with minerals tablet Take 1 tablet by mouth at bedtime.   neomycin-bacitracin-polymyxin Oint Commonly known as:  NEOSPORIN Apply 1 application topically daily.   OMEGA-3 PO Take 540 mg by mouth at bedtime.   oxyCODONE 5 MG immediate release tablet Commonly known as:  Oxy IR/ROXICODONE Take 1 tablet (5 mg total) by mouth every 6 (six) hours as needed for severe pain.   pantoprazole 40 MG tablet Commonly known as:  PROTONIX Take 40 mg by mouth daily.   rosuvastatin 10 MG tablet Commonly known as:  CRESTOR Take 10 mg by mouth at bedtime. Reported on 09/18/2015        Follow-up Information    [facility]. Go on 01/07/2017.   Why:  Your appointment is  01/07/17 at 9:30AM. Please arrive 30 minutes prior to your appointment to check in and fill out necessary paperwork. Contact information: [place] [address] [phone]          Signed: Wellington Hampshire, [facility] Surgery 12/18/2016, 2:14 PM Pager: [phone] Consults: [phone] Mon-Fri 7:00 am-4:30 pm Sat-Sun 7:00 am-11:30 am

## 2016-12-18 NOTE — Discharge Instructions (Signed)
Rib Fracture ° °A rib fracture is a break or crack in one of the bones of the ribs. The ribs are a group of long, curved bones that wrap around your chest and attach to your spine. They protect your lungs and other organs in the chest cavity. A broken or cracked rib is often painful, but most do not cause other problems. Most rib fractures heal on their own over time. However, rib fractures can be more serious if multiple ribs are broken or if broken ribs move out of place and push against other structures. °What are the causes? °· A direct blow to the chest. For example, this could happen during contact sports, a car accident, or a fall against a hard object. °· Repetitive movements with high force, such as pitching a baseball or having severe coughing spells. °What are the signs or symptoms? °· Pain when you breathe in or cough. °· Pain when someone presses on the injured area. °How is this diagnosed? °Your caregiver will perform a physical exam. Various imaging tests may be ordered to confirm the diagnosis and to look for related injuries. These tests may include a chest X-ray, computed tomography (CT), magnetic resonance imaging (MRI), or a bone scan. °How is this treated? °Rib fractures usually heal on their own in 1-3 months. The longer healing period is often associated with a continued cough or other aggravating activities. During the healing period, pain control is very important. Medication is usually given to control pain. Hospitalization or surgery may be needed for more severe injuries, such as those in which multiple ribs are broken or the ribs have moved out of place. °Follow these instructions at home: °· Avoid strenuous activity and any activities or movements that cause pain. Be careful during activities and avoid bumping the injured rib. °· Gradually increase activity as directed by your caregiver. °· Only take over-the-counter or prescription medications as directed by your caregiver. Do not take  other medications without asking your caregiver first. °· Apply ice to the injured area for the first 1-2 days after you have been treated or as directed by your caregiver. Applying ice helps to reduce inflammation and pain. °? Put ice in a plastic bag. °? Place a towel between your skin and the bag. °? Leave the ice on for 15-20 minutes at a time, every 2 hours while you are awake. °· Perform deep breathing as directed by your caregiver. This will help prevent pneumonia, which is a common complication of a broken rib. Your caregiver may instruct you to: °? Take deep breaths several times a day. °? Try to cough several times a day, holding a pillow against the injured area. °? Use a device called an incentive spirometer to practice deep breathing several times a day. °· Drink enough fluids to keep your urine clear or pale yellow. This will help you avoid constipation. °· Do not wear a rib belt or binder. These restrict breathing, which can lead to pneumonia. °Get help right away if: °· You have a fever. °· You have difficulty breathing or shortness of breath. °· You develop a continual cough, or you cough up thick or bloody sputum. °· You feel sick to your stomach (nausea), throw up (vomit), or have abdominal pain. °· You have worsening pain not controlled with medications. °This information is not intended to replace advice given to you by your health care provider. Make sure you discuss any questions you have with your health care provider. °Document Released: 04/20/2005   Revised: 09/26/2015 Document Reviewed: 06/22/2012 Elsevier Interactive Patient Education  [employer].    I would recommend scheduling Tylenol, Ibuprofen, and Robxin around the clock for the first 3-5 days to help with pain control. Use oxycodone as needed. Wean off the scheduled medications as able.

## 2016-12-18 NOTE — Care Management Note (Signed)
Case Management Note  Patient Details  Name: Krista Austin MRN: [id number] Date of Birth: 12-Sep-1945  Subjective/Objective:  Pt admitted on 12/17/16 s/p fall with rib fx and small PTX.  PTA, pt independent, lives at home with daughter, son in law and two grandchildren.  She also is primary caregiver for her adult son with Downs Syndrome.                   Action/Plan: PT recommending HH follow up; OT consult pending.  Pt agreeable to [facility] follow up at dc; prefers [facility] for [facility] needs.  Will refer to [facility] for [facility] services; start of care 24-48h post dc date.    Expected Discharge Date:                  Expected Discharge Plan:  [place]  In-House Referral:     Discharge planning Services  CM Consult  Post Acute Care Choice:  Home Health Choice offered to:  Patient  DME Arranged:    DME Agency:     HH Arranged:  PT [place]:  [place]  Status of Service:  Completed, signed off  If discussed at [place] of Stay Meetings, dates discussed:    Additional Comments:  12/18/16 J. Tersea Aulds, RN, BSN Pt for possible dc later today.  Referral to [facility] for HHPT, as recommended, per pt choice.  Start of care 24-48h post dc date.    Reinaldo Raddle, RN, BSN  Trauma/Neuro ICU Case Manager [phone]

## 2016-12-25 ENCOUNTER — Other Ambulatory Visit: Payer: Self-pay | Admitting: Thoracic Surgery (Cardiothoracic Vascular Surgery)

## 2016-12-25 DIAGNOSIS — R911 Solitary pulmonary nodule: Secondary | ICD-10-CM

## 2016-12-27 ENCOUNTER — Encounter: Payer: Self-pay | Admitting: Thoracic Surgery (Cardiothoracic Vascular Surgery)

## 2016-12-29 ENCOUNTER — Encounter: Payer: Self-pay | Admitting: *Deleted

## 2017-01-19 ENCOUNTER — Ambulatory Visit: Payer: Medicare Other | Admitting: Thoracic Surgery (Cardiothoracic Vascular Surgery)

## 2017-01-19 ENCOUNTER — Other Ambulatory Visit: Payer: Medicare Other

## 2017-05-20 ENCOUNTER — Encounter: Payer: Self-pay | Admitting: Thoracic Surgery (Cardiothoracic Vascular Surgery)

## 2017-05-21 ENCOUNTER — Other Ambulatory Visit: Payer: Self-pay | Admitting: Thoracic Surgery (Cardiothoracic Vascular Surgery)

## 2017-05-21 DIAGNOSIS — R911 Solitary pulmonary nodule: Secondary | ICD-10-CM

## 2017-06-15 ENCOUNTER — Ambulatory Visit
Admission: RE | Admit: 2017-06-15 | Discharge: 2017-06-15 | Disposition: A | Discharge status: Home / Self Care | Payer: Medicare Other | Source: Ambulatory Visit | Attending: Thoracic Surgery (Cardiothoracic Vascular Surgery) | Admitting: Thoracic Surgery (Cardiothoracic Vascular Surgery)

## 2017-06-15 ENCOUNTER — Ambulatory Visit (INDEPENDENT_AMBULATORY_CARE_PROVIDER_SITE_OTHER): Payer: Medicare Other | Admitting: Thoracic Surgery (Cardiothoracic Vascular Surgery)

## 2017-06-15 VITALS — BP 122/79 | HR 72 | Resp 18 | Ht 65.0 in | Wt 160.0 lb

## 2017-06-15 DIAGNOSIS — R911 Solitary pulmonary nodule: Secondary | ICD-10-CM | POA: Diagnosis not present

## 2017-06-15 NOTE — Progress Notes (Signed)
[address]       [place] [address]             [phone]     HPI: Krista Austin returns for follow-up of a possible right upper lobe nodule  Krista Austin is a 72 year old woman with a history of tobacco abuse (50 pack years, quit June 2018), thyroid nodules, type 2 diabetes, hyperlipidemia, chronic back pain, reflux, and depression.  She also has obstructive sleep apnea requiring CPAP.   She was being evaluated for chest pain last summer when she had a chest x-ray which showed a possible left lung nodule.  A CT of the chest showed this was probable pleural parenchymal scarring, but there was a 6 mm groundglass opacity in the right upper lobe.  She fell and suffered rib fractures in August.  At that time she had a small right pneumothorax.  The groundglass opacity was not visible, the left lung opacity was stable.  She now returns with a repeat CT scan.  She has been feeling well.  She does still have pain associated with the area of her rib fractures.  She is not having any shortness of breath, coughing, wheezing, or hemoptysis.  Past Medical History:  Diagnosis Date  . Adenomatous polyp   . Arthritis    "mostly fingers" (10/07/2016)  . At high risk for falls   . 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 Chronic back pain    "worse in my neck; lower back also affected" (10/07/2016)  . Chronic cholecystitis 10/16/2016  . Complication of anesthesia    "never fully regained my taste after my cervical fusion; I don't take much RX so I'm very sensitive to any sedation" (10/07/2016)  . Concussion    X2  . DDD (degenerative disc disease), cervical    lumbar  . Depression    "hx; take Prozac to keep me stable" (10/07/2016)  . Dizziness    caused by compression of cervical disc  . Family history of adverse reaction to anesthesia    "daughters get PONV; one daughter breaks out severely; grandson got suspended in a state  of anxiousness after kidney surgery; had to be held for hours"  . GERD (gastroesophageal reflux disease)   . Hyperlipidemia   . OSA on CPAP   . Pneumonia    "once as an adult" (10/07/2016)  . Thyroid nodule   . Type II diabetes mellitus ([place])    controlled by diet and exercide     Current Outpatient Medications  Medication Sig Dispense Refill  . acetaminophen (TYLENOL) 500 MG tablet Take 1 tablet (500 mg total) by mouth every 4 (four) hours. 30 tablet 0  . aspirin EC 81 MG tablet Take 81 mg by mouth at bedtime.     . Calcium-Phosphorus-Vitamin D (CITRACAL +D3 PO) Take 1 tablet by mouth at bedtime.    . cetirizine (ZYRTEC) 10 MG tablet Take 10 mg by mouth at bedtime.    Marland Kitchen CINNAMON PO Take 2 capsules by mouth daily.    Marland Kitchen FLUoxetine (PROZAC) 40 MG capsule Take 40 mg by mouth at bedtime.     . hydrocortisone cream 1 % Apply 1 application topically daily as needed for itching.    Marland Kitchen ibuprofen (ADVIL,MOTRIN) 400 MG tablet Take 1 tablet (400 mg total) by mouth every 6 (six) hours. 30 tablet 0  . lisinopril (PRINIVIL,ZESTRIL) 10 MG tablet Take 10  mg by mouth daily.    . methocarbamol (ROBAXIN) 500 MG tablet Take 1 tablet (500 mg total) by mouth 3 (three) times daily. 20 tablet 0  . Multiple Vitamins-Minerals (MULTIVITAMIN WITH MINERALS) tablet Take 1 tablet by mouth at bedtime.     . Omega-3 Fatty Acids (FISH OIL PO) Take 2 capsules by mouth daily.    . pantoprazole (PROTONIX) 40 MG tablet Take 40 mg by mouth daily.    . rosuvastatin (CRESTOR) 10 MG tablet Take 10 mg by mouth at bedtime. Reported on 09/18/2015  5   No current facility-administered medications for this visit.     Physical Exam BP 122/79   Pulse 72   Resp 18   Ht 5\' 5"  (1.651 m)   Wt 160 lb (72.6 kg)   SpO2 97% Comment: RA  BMI 26.15 kg/m  72 year old woman in no acute distress Alert and oriented x3 with no focal deficits Lungs clear with equal breath sounds bilaterally Cardiac regular rate and rhythm, normal S1 and  S2  Diagnostic Tests: CT CHEST WITHOUT CONTRAST  TECHNIQUE: Multidetector CT imaging of the chest was performed following the standard protocol without IV contrast.  COMPARISON:  12/16/2016.  10/07/2016.  FINDINGS: Cardiovascular: The heart size is normal. No pericardial effusion. Coronary artery calcification is evident. Atherosclerotic calcification is noted in the wall of the thoracic aorta.  Mediastinum/Nodes: No mediastinal lymphadenopathy. No evidence for gross hilar lymphadenopathy although assessment is limited by the lack of intravenous contrast on today's study. The esophagus has normal imaging features. There is no axillary lymphadenopathy. 2.2 cm right thyroid nodule is stable in the interval.  Lungs/Pleura: The 6 mm ground-glass nodule seen on the 10/07/2016 exam has resolved in the interval. There is scattered subsegmental atelectasis. No suspicious pulmonary nodule or mass. No focal airspace consolidation. No pleural effusion.  Upper Abdomen: Unremarkable  Musculoskeletal: Old posterior right rib fractures noted Bone windows reveal no worrisome lytic or sclerotic osseous lesions.  IMPRESSION: 1. 6 mm anterior right upper lobe ground-glass nodule seen on 10/07/2016 has resolved completely. 2. Stable 2.2 cm right thyroid nodule. Ultrasound evaluation recommended. This follows ACR consensus guidelines: Managing Incidental Thyroid Nodules Detected on Imaging: White Paper of the ACR Incidental Thyroid Findings Committee. J Am Coll Radiol 2015; 12:143-150. 3.  Aortic Atherosclerois (ICD10-170.0)   Electronically Signed   By: Misty Stanley M.D.   On: 06/15/2017 12:35 I personally reviewed the CT images and concur with the findings noted above.  There is still the opacity in the lingula that is stable.  Impression: Krista Austin is a 72 year old woman with a history of tobacco abuse who was found to have a groundglass nodule on a CT scan about 6 months  ago.  That has resolved completely.  She does have other pleural parenchymal opacities.  Most worrisome these is in the lingula.  While this may represent scarring, I do not think we can completely rule out that there is a nodule in that area.  Given her smoking history I would like to rescan that again in a year to make sure there is no change before writing it off completely.  Rib fractures-continues to have pain.  Her CT today shows some callus formation and healing in that area.  These often take many months for the pain to resolve.  Tobacco abuse-50 pack years.  Quit in June 2018.Smoking cessation instruction/counseling given:  commended patient for quitting and reviewed strategies for preventing relapses   Plan: Return in 1 year with  CT chest to follow-up lingular opacity  Melrose Nakayama, MD [facility] [phone]

## 2017-06-18 ENCOUNTER — Other Ambulatory Visit: Payer: Self-pay | Admitting: Physician Assistant

## 2017-06-18 DIAGNOSIS — Z1231 Encounter for screening mammogram for malignant neoplasm of breast: Secondary | ICD-10-CM

## 2017-07-12 ENCOUNTER — Ambulatory Visit: Payer: Medicare Other

## 2017-09-08 ENCOUNTER — Ambulatory Visit: Payer: Medicare Other

## 2017-10-08 ENCOUNTER — Ambulatory Visit
Admission: RE | Admit: 2017-10-08 | Discharge: 2017-10-08 | Disposition: A | Discharge status: Home / Self Care | Payer: Medicare Other | Source: Ambulatory Visit | Attending: Physician Assistant | Admitting: Physician Assistant

## 2017-10-08 DIAGNOSIS — Z1231 Encounter for screening mammogram for malignant neoplasm of breast: Secondary | ICD-10-CM

## 2017-11-24 ENCOUNTER — Encounter: Payer: Self-pay | Admitting: Physical Therapy

## 2017-11-24 ENCOUNTER — Other Ambulatory Visit: Payer: Self-pay

## 2017-11-24 ENCOUNTER — Ambulatory Visit: Payer: Medicare Other | Attending: Neurosurgery | Admitting: Physical Therapy

## 2017-11-24 DIAGNOSIS — G8929 Other chronic pain: Secondary | ICD-10-CM | POA: Insufficient documentation

## 2017-11-24 DIAGNOSIS — M545 Low back pain: Secondary | ICD-10-CM | POA: Insufficient documentation

## 2017-11-24 DIAGNOSIS — M62830 Muscle spasm of back: Secondary | ICD-10-CM | POA: Insufficient documentation

## 2017-11-24 DIAGNOSIS — M25512 Pain in left shoulder: Secondary | ICD-10-CM | POA: Diagnosis present

## 2017-11-24 DIAGNOSIS — M542 Cervicalgia: Secondary | ICD-10-CM | POA: Diagnosis present

## 2017-11-24 NOTE — Therapy (Signed)
[facility] [address], [place], [place], [address] Phone: [phone]   Fax:  [phone]  Physical Therapy Evaluation  Patient Details  Name: Krista Austin MRN: [id number] Date of Birth: 15-Jul-1945 Referring Provider: Kary Kos, MD    Encounter Date: 11/24/2017  PT End of Session - 11/24/17 1628    Visit Number  1    Date for PT Re-Evaluation  01/25/18    Authorization Type  Medicare A and B    Authorization Time Period  11/24/17 to 01/25/18    PT Start Time  3536    PT Stop Time  1615    PT Time Calculation (min)  45 min    Activity Tolerance  No increased pain;Patient tolerated treatment well    Behavior During Therapy  [facility] for tasks assessed/performed       Past Medical History:  Diagnosis Date  . Adenomatous polyp   . Arthritis    "mostly fingers" (10/07/2016)  . At high risk for falls   . 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 Chronic back pain    "worse in my neck; lower back also affected" (10/07/2016)  . Chronic cholecystitis 10/16/2016  . Complication of anesthesia    "never fully regained my taste after my cervical fusion; I Austin't take much RX so I'm very sensitive to any sedation" (10/07/2016)  . Concussion    X2  . DDD (degenerative disc disease), cervical    lumbar  . Depression    "hx; take Prozac to keep me stable" (10/07/2016)  . Dizziness    caused by compression of cervical disc  . Family history of adverse reaction to anesthesia    "daughters get PONV; one daughter breaks out severely; grandson got suspended in a state of anxiousness after kidney surgery; had to be held for hours"  . GERD (gastroesophageal reflux disease)   . Hyperlipidemia   . OSA on CPAP   . Pneumonia    "once as an adult" (10/07/2016)  . Thyroid nodule   . Type II diabetes mellitus ([place])    controlled by diet and exercide    Past Surgical History:  Procedure  Laterality Date  . ANTERIOR CERVICAL DECOMP/DISCECTOMY FUSION  2015  . BACK SURGERY    . CARPAL TUNNEL RELEASE Bilateral   . CATARACT EXTRACTION, BILATERAL Bilateral 09/10/2014 - 09/24/2014   by Darleen Crocker  . CHOLECYSTECTOMY N/A 10/16/2016   Procedure: LAPAROSCOPIC CHOLECYSTECTOMY;  Surgeon: [name], MD;  Location: [place];  Service: General;  Laterality: N/A;  . LAPAROSCOPIC CHOLECYSTECTOMY  10/16/2016  . [place] SURGERY  2007  . TONSILLECTOMY  05/05/1983  . TUBAL LIGATION  02/09/1979    There were no vitals filed for this visit.   Subjective Assessment - 11/24/17 1540    Subjective  Pt reports that she has had neck and low back pain for many years. She has a big issue with her neck and low back. It's difficult to go to sleep sometimes. She has pain down her neck and to the Lt elbow. She has tingling down the Rt side of the low back to the foot. She has liked the ultrasound in the past and has not been able to complete her HEP recently due to a busy life style. She tries to walk atleast 20 minutes every day.     Pertinent History  ACDF C5/C6 2015; Lumbar surgery; arthritis  Currently in Pain?  Yes    Pain Score  1     Pain Location  Neck    Pain Orientation  Left    Pain Descriptors / Indicators  Aching    Pain Type  Chronic pain    Pain Radiating Towards  over Lt shoulder     Pain Onset  More than a month ago    Pain Frequency  Intermittent    Aggravating Factors   direct pressure; turning head (Lt>Rt)    Pain Relieving Factors  mostly heat    Effect of Pain on Daily Activities  difficulty sleeping     Multiple Pain Sites  Yes         [facility] PT Assessment - 11/24/17 0001      Assessment   Medical Diagnosis  cervicalgia; lumbago    Referring Provider  Kary Kos, MD     Hand Dominance  Left    Next MD Visit  01/04/18    Prior Therapy  several years ago OPPT      Precautions   Precautions  None      Balance Screen   Has the patient fallen in the past 6 months   No    Has the patient had a decrease in activity level because of a fear of falling?   No    Is the patient reluctant to leave their home because of a fear of falling?   No      Cognition   Overall Cognitive Status  Within Functional Limits for tasks assessed      Sensation   Additional Comments  denies numbness and tingling in UE, notes tingling RLE down to the foot       Posture/Postural Control   Posture Comments  forward head      ROM / Strength   AROM / PROM / Strength  AROM;Strength      AROM   AROM Assessment Site  Cervical;Lumbar    Cervical Flexion  -- (+) pain    Cervical Extension  20    Cervical - Right Side Bend  15    Cervical - Left Side Bend  15    Cervical - Right Rotation  45    Cervical - Left Rotation  50    Lumbar Flexion  x10 reps, no chang in symptoms     Lumbar Extension  x10 reps, no change in pain reported       Strength   Strength Assessment Site  Shoulder;Elbow;Hip;Knee;Ankle    Right/Left Shoulder  Right;Left    Right Shoulder Flexion  5/5    Right Shoulder ABduction  5/5    Right Shoulder Internal Rotation  5/5    Right Shoulder External Rotation  4/5    Left Shoulder Flexion  4/5    Left Shoulder ABduction  4/5 pain    Left Shoulder Internal Rotation  3/5 (+) pain    Left Shoulder External Rotation  4/5    Right/Left Elbow  Right;Left    Right Elbow Flexion  5/5    Right Elbow Extension  5/5    Left Elbow Flexion  5/5    Left Elbow Extension  5/5    Right/Left Hip  Right;Left    Right Hip Flexion  5/5    Right Hip Extension  5/5    Right Hip ABduction  5/5    Left Hip Flexion  5/5    Left Hip Extension  5/5    Left Hip  ABduction  5/5    Right/Left Knee  Right;Left    Right Knee Flexion  5/5    Right Knee Extension  5/5    Left Knee Flexion  5/5    Left Knee Extension  5/5    Right/Left Ankle  Right;Left      Flexibility   Soft Tissue Assessment /Muscle Length  yes    Hamstrings  WNL    Quadriceps  -- (+) thomas test Lt and Rt       Palpation   Palpation comment  tenderness along cervical paraspinals, Lt anterior shoulder/subscap tendon and bicep tendon; palpable tenderness along Rt gluteals, ITB                Objective measurements completed on examination: See above findings.              PT Education - 11/24/17 1627    Education Details  eval findings/POC    Person(s) Educated  Patient    Methods  Explanation    Comprehension  Verbalized understanding       PT Short Term Goals - 11/24/17 1637      PT SHORT TERM GOAL #1   Title  Pt will be independent with her initial HEP to decrease pain and improve cervical ROM.     Time  4    Period  Weeks    Status  New      PT SHORT TERM GOAL #2   Title  Pt will report atleast 30% decrease in her neck and shoulder pain from the start of PT which will allow her to complete daily housework with less difficulty.     Time  4    Period  Weeks    Status  New      PT SHORT TERM GOAL #3   Title  Pt will demo improved cervical active rotation to atleast 60 deg each direction which will allow her to check her blind spot while driving.     Time  4    Period  Weeks    Status  New        PT Long Term Goals - 11/24/17 1639      PT LONG TERM GOAL #1   Title  Pt will demo improved Lt shoulder strength to 5/5 MMT which will impove her efficiency with daily activity.     Time  8    Period  Weeks    Status  New      PT LONG TERM GOAL #2   Title  Pt will report atleast 60% improvement in cervical tightness/discomfort with daily activity.    Time  8    Period  Weeks    Status  New      PT LONG TERM GOAL #3   Title  Pt will demo improved thoracic active ROM to atleast 45 deg each direction which will assist with shoulder and neck ROM.     Time  8    Period  Weeks    Status  New      PT LONG TERM GOAL #4   Title  Pt will be independent with her advanced HEP and verbalize understanding of exercise progressions which will allow her to maintain  progress after discharge from PT.     Time  8    Period  Weeks    Status  New             Plan - 11/24/17 1629  Clinical Impression Statement  Pt is a pleasant 72 y.o F referred to OPPT with complaints of both cervical and lumbar pain, chronic in nature. She has had some success with PT in the past. She presents today with midline cervical pain, Lt shoulder pain with weakness into abduction and IR, as well as midline low back and Rt hip pain. She has limitations in cervical active and passive ROM, limited Lt shoulder strength and near full LE strength. Noted palpable tenderness to the Rt gluteals and ITB as well as the cervical paraspinals and Lt subscapularis and bicep tendon. 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      History and Personal Factors relevant to plan of care:  Cervical and lumbar surgery    Clinical Presentation  Stable    Clinical Decision Making  Low    Rehab Potential  Good    PT Frequency  2x / week    PT Duration  8 weeks    PT Treatment/Interventions  ADLs/Self Care Home Management;Traction;Moist Heat;Electrical Stimulation;Cryotherapy;Therapeutic exercise;Therapeutic activities;Functional mobility training;Neuromuscular re-education;Patient/family education;Manual techniques;Passive range of motion;Dry needling;Taping    PT Next Visit Plan  DN cervical spine; cervical ROM stretches, lumbar stretches, posture strengthening    PT Home Exercise Plan  next session    Consulted and Agree with Plan of Care  Patient       Patient will benefit from skilled therapeutic intervention in order to improve the following deficits and impairments:  Decreased activity tolerance, Decreased strength, Impaired flexibility, Postural dysfunction, Pain, Improper body mechanics, Decreased range of motion, Increased muscle spasms,  Hypomobility  Visit Diagnosis: Cervicalgia  Left shoulder pain, unspecified chronicity  Chronic low back pain, unspecified back pain laterality, with sciatica presence unspecified  Muscle spasm of back     Problem List Patient Active Problem List   Diagnosis Date Noted  . Closed fracture of 9th & 10th ribs of right side 12/17/2016  . Pneumothorax on right 12/17/2016  . Lumbar degenerative disc disease 12/17/2016  . Essential hypertension 12/17/2016  . Dyslipidemia 10/07/2016  . Pulmonary nodule 10/07/2016  . Abnormal CXR   . Chest pain 10/06/2016  . S/P lumbar spinal fusion 08/05/2016  . Obstructive sleep apnea syndrome, mild 10/27/2013  . Controlled type 2 diabetes mellitus without complication, without long-term current use of insulin ([place]) 06/19/2011  . GAD (generalized anxiety disorder) 11/01/2009   4:47 PM,11/24/17 Sherol Dade PT, DPT [place] [facility] [address] [place], [place], [address] Phone: [phone]   Fax:  [phone]  Name: Krista Austin MRN: [id number] Date of Birth: [birth year]

## 2017-11-24 NOTE — Patient Instructions (Signed)

## 2017-12-01 ENCOUNTER — Encounter: Payer: Self-pay | Admitting: Physical Therapy

## 2017-12-01 ENCOUNTER — Ambulatory Visit: Payer: Medicare Other | Admitting: Physical Therapy

## 2017-12-01 DIAGNOSIS — M62830 Muscle spasm of back: Secondary | ICD-10-CM

## 2017-12-01 DIAGNOSIS — M542 Cervicalgia: Secondary | ICD-10-CM | POA: Diagnosis not present

## 2017-12-01 DIAGNOSIS — G8929 Other chronic pain: Secondary | ICD-10-CM

## 2017-12-01 DIAGNOSIS — M545 Low back pain: Secondary | ICD-10-CM

## 2017-12-01 DIAGNOSIS — M25512 Pain in left shoulder: Secondary | ICD-10-CM

## 2017-12-01 NOTE — Therapy (Signed)
[facility] [address] [place], [place], [address] Phone: [phone]   Fax:  [phone]  Physical Therapy Treatment  Patient Details  Name: Krista Austin MRN: [id number] Date of Birth: Jan 23, 1946 Referring Provider: Kary Kos, MD    Encounter Date: 12/01/2017  PT End of Session - 12/01/17 1016    Visit Number  2    Date for PT Re-Evaluation  01/25/18    Authorization Type  Medicare A and B    Authorization Time Period  11/24/17 to 01/25/18    PT Start Time  1005    PT Stop Time  1050    PT Time Calculation (min)  45 min    Activity Tolerance  No increased pain;Patient tolerated treatment well    Behavior During Therapy  [facility] for tasks assessed/performed       Past Medical History:  Diagnosis Date  . Adenomatous polyp   . Arthritis    "mostly fingers" (10/07/2016)  . At high risk for falls   . 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 Chronic back pain    "worse in my neck; lower back also affected" (10/07/2016)  . Chronic cholecystitis 10/16/2016  . Complication of anesthesia    "never fully regained my taste after my cervical fusion; I don't take much RX so I'm very sensitive to any sedation" (10/07/2016)  . Concussion    X2  . DDD (degenerative disc disease), cervical    lumbar  . Depression    "hx; take Prozac to keep me stable" (10/07/2016)  . Dizziness    caused by compression of cervical disc  . Family history of adverse reaction to anesthesia    "daughters get PONV; one daughter breaks out severely; grandson got suspended in a state of anxiousness after kidney surgery; had to be held for hours"  . GERD (gastroesophageal reflux disease)   . Hyperlipidemia   . OSA on CPAP   . Pneumonia    "once as an adult" (10/07/2016)  . Thyroid nodule   . Type II diabetes mellitus ([place])    controlled by diet and exercide    Past Surgical History:  Procedure  Laterality Date  . ANTERIOR CERVICAL DECOMP/DISCECTOMY FUSION  2015  . BACK SURGERY    . CARPAL TUNNEL RELEASE Bilateral   . CATARACT EXTRACTION, BILATERAL Bilateral 09/10/2014 - 09/24/2014   by Darleen Crocker  . CHOLECYSTECTOMY N/A 10/16/2016   Procedure: LAPAROSCOPIC CHOLECYSTECTOMY;  Surgeon: [name], MD;  Location: [place];  Service: General;  Laterality: N/A;  . LAPAROSCOPIC CHOLECYSTECTOMY  10/16/2016  . [place] SURGERY  2007  . TONSILLECTOMY  05/05/1983  . TUBAL LIGATION  02/09/1979    There were no vitals filed for this visit.  Subjective Assessment - 12/01/17 1007    Subjective  Pt states that she saw her doctor over the past week and was confirmed she has carpal tunnel on the Lt. She is going to have an injection in one week. She still will have pain on her low neck when putting direct pressure on the neck.     Pertinent History  ACDF C5/C6 2015; Lumbar surgery; arthritis    Currently in Pain?  Yes    Pain Score  3     Pain Location  Neck    Pain Orientation  Left;Medial    Pain Descriptors / Indicators  Aching    Pain Type  Chronic  pain    Pain Radiating Towards  over the Lt shoulder     Pain Onset  More than a month ago    Pain Frequency  Intermittent    Aggravating Factors   direct pressure; turning head    Pain Relieving Factors  mostly heat    Effect of Pain on Daily Activities  difficulty sleeping                        [facility] Adult PT Treatment/Exercise - 12/01/17 0001      Exercises   Exercises  Neck;Lumbar      Neck Exercises: Machines for Strengthening   Nustep  L1 x6 min, PT present for update on doctor's visit      Neck Exercises: Supine   Neck Retraction  15 reps;3 secs      Neck Exercises: Sidelying   Other Sidelying Exercise  thoracic rotation x10 reps each side       Lumbar Exercises: Supine   Other Supine Lumbar Exercises  hooklying low trunk rotation x15 reps each side       Manual Therapy   Manual Therapy  Soft tissue  mobilization;Joint mobilization    Joint Mobilization  CPAs grade III along upper thoracic spine x2 bouts    Soft tissue mobilization  sub-occipital release, STM cervical paraspinals, Lt upper trap        Trigger Point Dry Needling - 12/01/17 1059    Consent Given?  Yes    Education Handout Provided  Yes    Muscles Treated Upper Body  Upper trapezius Multifidi from T2 to C4 on the Lt    Upper Trapezius Response  Twitch reponse elicited;Palpable increased muscle length           PT Education - 12/01/17 1016    Education Details  technique with therex; HEP provided     Person(s) Educated  Patient    Methods  Explanation;Handout;Verbal cues    Comprehension  Verbalized understanding;Returned demonstration       PT Short Term Goals - 12/01/17 1058      PT SHORT TERM GOAL #1   Title  Pt will be independent with her initial HEP to decrease pain and improve cervical ROM.     Time  4    Period  Weeks    Status  On-going      PT SHORT TERM GOAL #2   Title  Pt will report atleast 30% decrease in her neck and shoulder pain from the start of PT which will allow her to complete daily housework with less difficulty.     Time  4    Period  Weeks    Status  On-going      PT SHORT TERM GOAL #3   Title  Pt will demo improved cervical active rotation to atleast 60 deg each direction which will allow her to check her blind spot while driving.     Time  4    Period  Weeks    Status  New        PT Long Term Goals - 11/24/17 1639      PT LONG TERM GOAL #1   Title  Pt will demo improved Lt shoulder strength to 5/5 MMT which will impove her efficiency with daily activity.     Time  8    Period  Weeks    Status  New      PT LONG TERM GOAL #2  Title  Pt will report atleast 60% improvement in cervical tightness/discomfort with daily activity.    Time  8    Period  Weeks    Status  New      PT LONG TERM GOAL #3   Title  Pt will demo improved thoracic active ROM to atleast 45 deg  each direction which will assist with shoulder and neck ROM.     Time  8    Period  Weeks    Status  New      PT LONG TERM GOAL #4   Title  Pt will be independent with her advanced HEP and verbalize understanding of exercise progressions which will allow her to maintain progress after discharge from PT.     Time  8    Period  Weeks    Status  New            Plan - 12/01/17 1053    Clinical Impression Statement  Pt arrived with mild discomfort of the Lt neck and shoulder region. Therapist implemented pt's HEP and ensured she had full understanding of this. Also completed manual treatment to decrease soft tissue and joint restrictions of the thoracic and cervical spine. Pt was agreeable to dry needling treatment with several twitch responses noted during today's treatment. Ended session without increase in neck soreness and will continue to address restrictions in mobility, strength and increase her ability to complete daily activity without limitation.     Rehab Potential  Good    PT Frequency  2x / week    PT Duration  8 weeks    PT Treatment/Interventions  ADLs/Self Care Home Management;Traction;Moist Heat;Electrical Stimulation;Cryotherapy;Therapeutic exercise;Therapeutic activities;Functional mobility training;Neuromuscular re-education;Patient/family education;Manual techniques;Passive range of motion;Dry needling;Taping    PT Next Visit Plan  DN cervical spine and sub-occipitals; STM and manual to cervical/thoracic spine; cervical rotation stretches, lumbar stretches, posture strengthening    PT Home Exercise Plan  [id number]    Consulted and Agree with Plan of Care  Patient       Patient will benefit from skilled therapeutic intervention in order to improve the following deficits and impairments:  Decreased activity tolerance, Decreased strength, Impaired flexibility, Postural dysfunction, Pain, Improper body mechanics, Decreased range of motion, Increased muscle spasms,  Hypomobility  Visit Diagnosis: Cervicalgia  Left shoulder pain, unspecified chronicity  Chronic low back pain, unspecified back pain laterality, with sciatica presence unspecified  Muscle spasm of back     Problem List Patient Active Problem List   Diagnosis Date Noted  . Closed fracture of 9th & 10th ribs of right side 12/17/2016  . Pneumothorax on right 12/17/2016  . Lumbar degenerative disc disease 12/17/2016  . Essential hypertension 12/17/2016  . Dyslipidemia 10/07/2016  . Pulmonary nodule 10/07/2016  . Abnormal CXR   . Chest pain 10/06/2016  . S/P lumbar spinal fusion 08/05/2016  . Obstructive sleep apnea syndrome, mild 10/27/2013  . Controlled type 2 diabetes mellitus without complication, without long-term current use of insulin ([place]) 06/19/2011  . GAD (generalized anxiety disorder) 11/01/2009   11:04 AM,12/01/17 Sherol Dade PT, DPT [place] [facility] [address] [place], [place], [address] Phone: [phone]   Fax:  [phone]  Name: NATHAN STALLWORTH MRN: [id number] Date of Birth: 05/04/1946

## 2017-12-03 ENCOUNTER — Encounter: Payer: Self-pay | Admitting: Physical Therapy

## 2017-12-03 ENCOUNTER — Ambulatory Visit: Payer: Medicare Other | Attending: Neurosurgery | Admitting: Physical Therapy

## 2017-12-03 DIAGNOSIS — G8929 Other chronic pain: Secondary | ICD-10-CM | POA: Diagnosis present

## 2017-12-03 DIAGNOSIS — M545 Low back pain: Secondary | ICD-10-CM | POA: Insufficient documentation

## 2017-12-03 DIAGNOSIS — M25512 Pain in left shoulder: Secondary | ICD-10-CM | POA: Insufficient documentation

## 2017-12-03 DIAGNOSIS — M542 Cervicalgia: Secondary | ICD-10-CM | POA: Diagnosis present

## 2017-12-03 DIAGNOSIS — M62830 Muscle spasm of back: Secondary | ICD-10-CM | POA: Insufficient documentation

## 2017-12-03 NOTE — Therapy (Signed)
[facility] [address], [place], [place], [address] Phone: [phone]   Fax:  [phone]  Physical Therapy Treatment  Patient Details  Name: Krista Austin MRN: [id number] Date of Birth: [birth year] Referring Provider: Kary Kos, MD    Encounter Date: 12/03/2017  PT End of Session - 12/03/17 1006    Visit Number  3    Date for PT Re-Evaluation  01/25/18    Authorization Type  Medicare A and B    Authorization Time Period  11/24/17 to 01/25/18    PT Start Time  0931    PT Stop Time  1011    PT Time Calculation (min)  40 min    Activity Tolerance  No increased pain;Patient tolerated treatment well    Behavior During Therapy  [facility] for tasks assessed/performed       Past Medical History:  Diagnosis Date  . Adenomatous polyp   . Arthritis    "mostly fingers" (10/07/2016)  . At high risk for falls   . 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 Chronic back pain    "worse in my neck; lower back also affected" (10/07/2016)  . Chronic cholecystitis 10/16/2016  . Complication of anesthesia    "never fully regained my taste after my cervical fusion; I don't take much RX so I'm very sensitive to any sedation" (10/07/2016)  . Concussion    X2  . DDD (degenerative disc disease), cervical    lumbar  . Depression    "hx; take Prozac to keep me stable" (10/07/2016)  . Dizziness    caused by compression of cervical disc  . Family history of adverse reaction to anesthesia    "daughters get PONV; one daughter breaks out severely; grandson got suspended in a state of anxiousness after kidney surgery; had to be held for hours"  . GERD (gastroesophageal reflux disease)   . Hyperlipidemia   . OSA on CPAP   . Pneumonia    "once as an adult" (10/07/2016)  . Thyroid nodule   . Type II diabetes mellitus ([place])    controlled by diet and exercide    Past Surgical History:  Procedure  Laterality Date  . ANTERIOR CERVICAL DECOMP/DISCECTOMY FUSION  2015  . BACK SURGERY    . CARPAL TUNNEL RELEASE Bilateral   . CATARACT EXTRACTION, BILATERAL Bilateral 09/10/2014 - 09/24/2014   by Darleen Crocker  . CHOLECYSTECTOMY N/A 10/16/2016   Procedure: LAPAROSCOPIC CHOLECYSTECTOMY;  Surgeon: [name], MD;  Location: [place];  Service: General;  Laterality: N/A;  . LAPAROSCOPIC CHOLECYSTECTOMY  10/16/2016  . [place] SURGERY  2007  . TONSILLECTOMY  05/05/1983  . TUBAL LIGATION  02/09/1979    There were no vitals filed for this visit.  Subjective Assessment - 12/03/17 0933    Subjective  Pt states that she was doing good the past couple of days, without neck or low back pain. Her Lt shoulder will still bother her especially in the mornings.     Pertinent History  ACDF C5/C6 2015; Lumbar surgery; arthritis    Currently in Pain?  Yes    Pain Score  3     Pain Location  Shoulder    Pain Orientation  Left;Lateral    Pain Descriptors / Indicators  Throbbing    Pain Type  Chronic pain    Pain Radiating Towards  none     Pain Onset  More than  a month ago    Pain Frequency  Intermittent    Aggravating Factors   sleeping on the shoulder, alot of use of the Lt arm     Pain Relieving Factors  unsure                        [facility] Adult PT Treatment/Exercise - 12/03/17 0001      Neck Exercises: Machines for Strengthening   Nustep  L1 x5 min, PT present to discuss progress       Neck Exercises: Standing   Other Standing Exercises  LUE ranger into flexion x20 reps, L35      Neck Exercises: Seated   Other Seated Exercise  rows x10 reps (heavy cuing to decrease upper trap shrug), x10 reps moderate PT cuing for technique       Neck Exercises: Supine   Shoulder ABduction  Both;10 reps;Limitations    Shoulder Abduction Limitations  x2 sets, red TB (horizontal abduction)     Other Supine Exercise  BUE ER with towel squeeze with yellow TB 2x10 reps       Neck Exercises:  Stretches   Other Neck Stretches  cervical rotation with towel 5x10 sec hold each side             PT Education - 12/03/17 1006    Education Details  technique with therex; updated HEP    Person(s) Educated  Patient    Methods  Explanation;Handout;Verbal cues    Comprehension  Verbalized understanding;Returned demonstration       PT Short Term Goals - 12/01/17 1058      PT SHORT TERM GOAL #1   Title  Pt will be independent with her initial HEP to decrease pain and improve cervical ROM.     Time  4    Period  Weeks    Status  On-going      PT SHORT TERM GOAL #2   Title  Pt will report atleast 30% decrease in her neck and shoulder pain from the start of PT which will allow her to complete daily housework with less difficulty.     Time  4    Period  Weeks    Status  On-going      PT SHORT TERM GOAL #3   Title  Pt will demo improved cervical active rotation to atleast 60 deg each direction which will allow her to check her blind spot while driving.     Time  4    Period  Weeks    Status  New        PT Long Term Goals - 11/24/17 1639      PT LONG TERM GOAL #1   Title  Pt will demo improved Lt shoulder strength to 5/5 MMT which will impove her efficiency with daily activity.     Time  8    Period  Weeks    Status  New      PT LONG TERM GOAL #2   Title  Pt will report atleast 60% improvement in cervical tightness/discomfort with daily activity.    Time  8    Period  Weeks    Status  New      PT LONG TERM GOAL #3   Title  Pt will demo improved thoracic active ROM to atleast 45 deg each direction which will assist with shoulder and neck ROM.     Time  8    Period  Weeks  Status  New      PT LONG TERM GOAL #4   Title  Pt will be independent with her advanced HEP and verbalize understanding of exercise progressions which will allow her to maintain progress after discharge from PT.     Time  8    Period  Weeks    Status  New            Plan - 12/03/17  1007    Clinical Impression Statement  Pt continues to make progress towards her goals, noting no cervical or lumbar pain for the past 2 days. Session focused on improving scapular strength, and pt had increased difficulty with avoiding shoulder shrug and specifically activating the middle traps. Therapist provided heavy tactile and verbal cuing to improve technique and will continue to reinforce this moving forward. Ended session without reports of increased pain. Will continue with current POC.     Rehab Potential  Good    PT Frequency  2x / week    PT Duration  8 weeks    PT Treatment/Interventions  ADLs/Self Care Home Management;Traction;Moist Heat;Electrical Stimulation;Cryotherapy;Therapeutic exercise;Therapeutic activities;Functional mobility training;Neuromuscular re-education;Patient/family education;Manual techniques;Passive range of motion;Dry needling;Taping    PT Next Visit Plan  DN cervical spine and sub-occipitals if needed; STM and manual to cervical/thoracic spine; attempt rows again,  scap series in supine, lumbar stretches, posture strengthening    PT Home Exercise Plan  [id number]    Consulted and Agree with Plan of Care  Patient       Patient will benefit from skilled therapeutic intervention in order to improve the following deficits and impairments:  Decreased activity tolerance, Decreased strength, Impaired flexibility, Postural dysfunction, Pain, Improper body mechanics, Decreased range of motion, Increased muscle spasms, Hypomobility  Visit Diagnosis: Cervicalgia  Left shoulder pain, unspecified chronicity  Chronic low back pain, unspecified back pain laterality, with sciatica presence unspecified  Muscle spasm of back     Problem List Patient Active Problem List   Diagnosis Date Noted  . Closed fracture of 9th & 10th ribs of right side 12/17/2016  . Pneumothorax on right 12/17/2016  . Lumbar degenerative disc disease 12/17/2016  . Essential hypertension  12/17/2016  . Dyslipidemia 10/07/2016  . Pulmonary nodule 10/07/2016  . Abnormal CXR   . Chest pain 10/06/2016  . S/P lumbar spinal fusion 08/05/2016  . Obstructive sleep apnea syndrome, mild 10/27/2013  . Controlled type 2 diabetes mellitus without complication, without long-term current use of insulin ([place]) 06/19/2011  . GAD (generalized anxiety disorder) 11/01/2009    10:15 AM,12/03/17 Sherol Dade PT, DPT [place] [facility] [address], [place], [place], [address] Phone: [phone]   Fax:  [phone]  Name: Krista Austin MRN: [id number] Date of Birth: 1946-04-14

## 2017-12-09 ENCOUNTER — Encounter: Payer: Medicare Other | Admitting: Physical Therapy

## 2017-12-13 ENCOUNTER — Encounter: Payer: Self-pay | Admitting: Physical Therapy

## 2017-12-13 ENCOUNTER — Ambulatory Visit: Payer: Medicare Other | Admitting: Physical Therapy

## 2017-12-13 DIAGNOSIS — M542 Cervicalgia: Secondary | ICD-10-CM

## 2017-12-13 DIAGNOSIS — M25512 Pain in left shoulder: Secondary | ICD-10-CM

## 2017-12-13 DIAGNOSIS — M545 Low back pain: Secondary | ICD-10-CM

## 2017-12-13 DIAGNOSIS — G8929 Other chronic pain: Secondary | ICD-10-CM

## 2017-12-13 DIAGNOSIS — M62830 Muscle spasm of back: Secondary | ICD-10-CM

## 2017-12-13 NOTE — Therapy (Signed)
[facility] [address], [place], [place], [address] Phone: [phone]   Fax:  [phone]  Physical Therapy Treatment  Patient Details  Name: Krista Austin MRN: [id number] Date of Birth: Sep 06, 1945 Referring Provider: Kary Kos, MD    Encounter Date: 12/13/2017  PT End of Session - 12/13/17 1026    Visit Number  4    Date for PT Re-Evaluation  01/25/18    Authorization Type  Medicare A and B    Authorization Time Period  11/24/17 to 01/25/18    PT Start Time  1015    PT Stop Time  1058    PT Time Calculation (min)  43 min    Activity Tolerance  No increased pain;Patient tolerated treatment well    Behavior During Therapy  [facility] for tasks assessed/performed       Past Medical History:  Diagnosis Date  . Adenomatous polyp   . Arthritis    "mostly fingers" (10/07/2016)  . At high risk for falls   . 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 Chronic back pain    "worse in my neck; lower back also affected" (10/07/2016)  . Chronic cholecystitis 10/16/2016  . Complication of anesthesia    "never fully regained my taste after my cervical fusion; I don't take much RX so I'm very sensitive to any sedation" (10/07/2016)  . Concussion    X2  . DDD (degenerative disc disease), cervical    lumbar  . Depression    "hx; take Prozac to keep me stable" (10/07/2016)  . Dizziness    caused by compression of cervical disc  . Family history of adverse reaction to anesthesia    "daughters get PONV; one daughter breaks out severely; grandson got suspended in a state of anxiousness after kidney surgery; had to be held for hours"  . GERD (gastroesophageal reflux disease)   . Hyperlipidemia   . OSA on CPAP   . Pneumonia    "once as an adult" (10/07/2016)  . Thyroid nodule   . Type II diabetes mellitus ([place])    controlled by diet and exercide    Past Surgical History:  Procedure  Laterality Date  . ANTERIOR CERVICAL DECOMP/DISCECTOMY FUSION  2015  . BACK SURGERY    . CARPAL TUNNEL RELEASE Bilateral   . CATARACT EXTRACTION, BILATERAL Bilateral 09/10/2014 - 09/24/2014   by Darleen Crocker  . CHOLECYSTECTOMY N/A 10/16/2016   Procedure: LAPAROSCOPIC CHOLECYSTECTOMY;  Surgeon: [name], MD;  Location: [place];  Service: General;  Laterality: N/A;  . LAPAROSCOPIC CHOLECYSTECTOMY  10/16/2016  . [place] SURGERY  2007  . TONSILLECTOMY  05/05/1983  . TUBAL LIGATION  02/09/1979    There were no vitals filed for this visit.  Subjective Assessment - 12/13/17 1016    Subjective  Pt reports that her injection went well. She has not been doing her HEP and has noticed increase in soreness of the neck, shoulder and low back.     Pertinent History  ACDF C5/C6 2015; Lumbar surgery; arthritis    Currently in Pain?  Yes    Pain Score  2     Pain Location  Neck    Pain Orientation  Left    Pain Descriptors / Indicators  Throbbing;Dull    Pain Type  Chronic pain    Pain Radiating Towards  none     Pain Onset  More than a month  ago    Pain Frequency  Intermittent    Aggravating Factors   sleeping on the Lt shoulder, turning head Lt/Rt and looking down     Pain Relieving Factors  exercises help alot     Effect of Pain on Daily Activities  difficulty sleeping, limited activity tolerance                       [facility] Adult PT Treatment/Exercise - 12/13/17 0001      Exercises   Exercises  Neck;Lumbar      Neck Exercises: Machines for Strengthening   Nustep  L1 x4 min, PT present to discuss progress and activity modification       Neck Exercises: Seated   Other Seated Exercise  rows 2x15 reps, yellow band increased to red TB      Neck Exercises: Supine   Shoulder ABduction  Both;10 reps;Limitations    Shoulder Abduction Limitations  x2 sets, red TB (horizontal abduction)    Other Supine Exercise  BUE ER with towel squeeze with yellow TB 2x10 reps        Lumbar Exercises: Supine   Other Supine Lumbar Exercises  B knees to chest with LE on red physioball x15 reps       Manual Therapy   Manual Therapy  Myofascial release    Soft tissue mobilization  STM Rt cervical paraspinals    Myofascial Release  trigger point release Rt upper trap, Rt levator scap, sub occipital release       Neck Exercises: Stretches   Other Neck Stretches  Rt scalene stretch 10x10 sec             PT Education - 12/13/17 1100    Education Details  technique with therex; updates to HEP    Person(s) Educated  Patient    Methods  Explanation;Verbal cues;Tactile cues    Comprehension  Verbalized understanding;Returned demonstration       PT Short Term Goals - 12/13/17 1100      PT SHORT TERM GOAL #1   Title  Pt will be independent with her initial HEP to decrease pain and improve cervical ROM.     Time  4    Period  Weeks    Status  Achieved      PT SHORT TERM GOAL #2   Title  Pt will report atleast 30% decrease in her neck and shoulder pain from the start of PT which will allow her to complete daily housework with less difficulty.     Time  4    Period  Weeks    Status  On-going      PT SHORT TERM GOAL #3   Title  Pt will demo improved cervical active rotation to atleast 60 deg each direction which will allow her to check her blind spot while driving.     Time  4    Period  Weeks    Status  On-going        PT Long Term Goals - 11/24/17 1639      PT LONG TERM GOAL #1   Title  Pt will demo improved Lt shoulder strength to 5/5 MMT which will impove her efficiency with daily activity.     Time  8    Period  Weeks    Status  New      PT LONG TERM GOAL #2   Title  Pt will report atleast 60% improvement in cervical tightness/discomfort with daily  activity.    Time  8    Period  Weeks    Status  New      PT LONG TERM GOAL #3   Title  Pt will demo improved thoracic active ROM to atleast 45 deg each direction which will assist with shoulder and  neck ROM.     Time  8    Period  Weeks    Status  New      PT LONG TERM GOAL #4   Title  Pt will be independent with her advanced HEP and verbalize understanding of exercise progressions which will allow her to maintain progress after discharge from PT.     Time  8    Period  Weeks    Status  New            Plan - 12/13/17 1101    Clinical Impression Statement  Pt reports some increase in neck and low back pain since her last session. She was not able to complete her HEP as she had been prior. Attempted rows again this session and pt demonstrated improved understanding of scapular position with this. Pt does have palpable trigger point of the upper trap and sub occipitals which the therapist addressed with manual techniques. Ended session with neck pain resolved. Will continue with current POC.     Rehab Potential  Good    PT Frequency  2x / week    PT Duration  8 weeks    PT Treatment/Interventions  ADLs/Self Care Home Management;Traction;Moist Heat;Electrical Stimulation;Cryotherapy;Therapeutic exercise;Therapeutic activities;Functional mobility training;Neuromuscular re-education;Patient/family education;Manual techniques;Passive range of motion;Dry needling;Taping    PT Next Visit Plan  DN cervical spine and sub-occipitals; STM and manual to cervical/thoracic spine as needed; lumbar stretches, posture strengthening    PT Home Exercise Plan  [id number]    Consulted and Agree with Plan of Care  Patient       Patient will benefit from skilled therapeutic intervention in order to improve the following deficits and impairments:  Decreased activity tolerance, Decreased strength, Impaired flexibility, Postural dysfunction, Pain, Improper body mechanics, Decreased range of motion, Increased muscle spasms, Hypomobility  Visit Diagnosis: Cervicalgia  Left shoulder pain, unspecified chronicity  Chronic low back pain, unspecified back pain laterality, with sciatica presence  unspecified  Muscle spasm of back     Problem List Patient Active Problem List   Diagnosis Date Noted  . Closed fracture of 9th & 10th ribs of right side 12/17/2016  . Pneumothorax on right 12/17/2016  . Lumbar degenerative disc disease 12/17/2016  . Essential hypertension 12/17/2016  . Dyslipidemia 10/07/2016  . Pulmonary nodule 10/07/2016  . Abnormal CXR   . Chest pain 10/06/2016  . S/P lumbar spinal fusion 08/05/2016  . Obstructive sleep apnea syndrome, mild 10/27/2013  . Controlled type 2 diabetes mellitus without complication, without long-term current use of insulin ([place]) 06/19/2011  . GAD (generalized anxiety disorder) 11/01/2009   11:48 AM,12/13/17 Sherol Dade PT, DPT [place] [facility] [address] [place], [place], [address] Phone: [phone]   Fax:  [phone]  Name: Krista Austin MRN: [id number] Date of Birth: 1945/10/08

## 2017-12-15 ENCOUNTER — Ambulatory Visit: Payer: Medicare Other

## 2017-12-15 DIAGNOSIS — M542 Cervicalgia: Secondary | ICD-10-CM

## 2017-12-15 DIAGNOSIS — G8929 Other chronic pain: Secondary | ICD-10-CM

## 2017-12-15 DIAGNOSIS — M25512 Pain in left shoulder: Secondary | ICD-10-CM

## 2017-12-15 DIAGNOSIS — M62830 Muscle spasm of back: Secondary | ICD-10-CM

## 2017-12-15 DIAGNOSIS — M545 Low back pain: Secondary | ICD-10-CM

## 2017-12-15 NOTE — Therapy (Signed)
[facility] [address], [place], [place], [address] Phone: [phone]   Fax:  [phone]  Physical Therapy Treatment  Patient Details  Name: Krista Austin MRN: [id number] Date of Birth: 12-09-1945 Referring Provider: Kary Kos, MD    Encounter Date: 12/15/2017  PT End of Session - 12/15/17 1105    Visit Number  5    Date for PT Re-Evaluation  01/25/18    Authorization Type  Medicare A and B    Authorization Time Period  11/24/17 to 01/25/18    PT Start Time  1016    PT Stop Time  1108    PT Time Calculation (min)  52 min    Activity Tolerance  No increased pain;Patient tolerated treatment well    Behavior During Therapy  [facility] for tasks assessed/performed       Past Medical History:  Diagnosis Date  . Adenomatous polyp   . Arthritis    "mostly fingers" (10/07/2016)  . At high risk for falls   . 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 Chronic back pain    "worse in my neck; lower back also affected" (10/07/2016)  . Chronic cholecystitis 10/16/2016  . Complication of anesthesia    "never fully regained my taste after my cervical fusion; I don't take much RX so I'm very sensitive to any sedation" (10/07/2016)  . Concussion    X2  . DDD (degenerative disc disease), cervical    lumbar  . Depression    "hx; take Prozac to keep me stable" (10/07/2016)  . Dizziness    caused by compression of cervical disc  . Family history of adverse reaction to anesthesia    "daughters get PONV; one daughter breaks out severely; grandson got suspended in a state of anxiousness after kidney surgery; had to be held for hours"  . GERD (gastroesophageal reflux disease)   . Hyperlipidemia   . OSA on CPAP   . Pneumonia    "once as an adult" (10/07/2016)  . Thyroid nodule   . Type II diabetes mellitus ([place])    controlled by diet and exercide    Past Surgical History:  Procedure  Laterality Date  . ANTERIOR CERVICAL DECOMP/DISCECTOMY FUSION  2015  . BACK SURGERY    . CARPAL TUNNEL RELEASE Bilateral   . CATARACT EXTRACTION, BILATERAL Bilateral 09/10/2014 - 09/24/2014   by Darleen Crocker  . CHOLECYSTECTOMY N/A 10/16/2016   Procedure: LAPAROSCOPIC CHOLECYSTECTOMY;  Surgeon: [name], MD;  Location: [place];  Service: General;  Laterality: N/A;  . LAPAROSCOPIC CHOLECYSTECTOMY  10/16/2016  . [place] SURGERY  2007  . TONSILLECTOMY  05/05/1983  . TUBAL LIGATION  02/09/1979    There were no vitals filed for this visit.  Subjective Assessment - 12/15/17 1020    Subjective  I have been doing my exercises and they have been helping    Pertinent History  ACDF C5/C6 2015; Lumbar surgery; arthritis    Currently in Pain?  Yes    Pain Score  1     Pain Location  Neck    Pain Orientation  Left                       [facility] Adult PT Treatment/Exercise - 12/15/17 0001      Exercises   Exercises  Neck;Lumbar      Neck Exercises: Machines for Strengthening   Nustep  L1 x6  min, PT present to discuss progress and activity modification       Neck Exercises: Supine   Shoulder ABduction  Both;10 reps;Limitations    Shoulder Abduction Limitations  2 sets with red theraband    Upper Extremity D2  Flexion;10 reps   pain on Lt so stopped this motion   Theraband Level (UE D2)  Level 2 (Red)    Other Supine Exercise  BUE ER with towel squeeze with red TB 2x10 reps       Lumbar Exercises: Stretches   Lower Trunk Rotation  5 reps;10 seconds    Lower Trunk Rotation Limitations  red ball under legs      Modalities   Modalities  Moist Heat      Moist Heat Therapy   Number Minutes Moist Heat  10 Minutes    Moist Heat Location  Cervical      Manual Therapy   Manual Therapy  Myofascial release    Soft tissue mobilization  STM Rt cervical paraspinals    Myofascial Release  trigger point release Rt upper trap, Rt levator scap, sub occipital release                 PT Short Term Goals - 12/15/17 1040      PT SHORT TERM GOAL #2   Title  Pt will report atleast 30% decrease in her neck and shoulder pain from the start of PT which will allow her to complete daily housework with less difficulty.     Baseline  60-70% in neck, no change in Lt shoulder    Time  4    Period  Weeks    Status  On-going        PT Long Term Goals - 11/24/17 1639      PT LONG TERM GOAL #1   Title  Pt will demo improved Lt shoulder strength to 5/5 MMT which will impove her efficiency with daily activity.     Time  8    Period  Weeks    Status  New      PT LONG TERM GOAL #2   Title  Pt will report atleast 60% improvement in cervical tightness/discomfort with daily activity.    Time  8    Period  Weeks    Status  New      PT LONG TERM GOAL #3   Title  Pt will demo improved thoracic active ROM to atleast 45 deg each direction which will assist with shoulder and neck ROM.     Time  8    Period  Weeks    Status  New      PT LONG TERM GOAL #4   Title  Pt will be independent with her advanced HEP and verbalize understanding of exercise progressions which will allow her to maintain progress after discharge from PT.     Time  8    Period  Weeks    Status  New            Plan - 12/15/17 1043    Clinical Impression Statement  Pt reports 80% improvement in LBP and 60-70% reduction in neck pain.  No change in Lt shoulder pain.  Pt requires tactile and verbal cues for scapular depression with UE exercise. Pt with trigger points in Lt>Rt neck and upper traps with manual therapy today.  Pt is compliant and independent in HEP for strength and flexibility.  Pt will continue to benefit from  skilled PT for manual, strength and flexibility.    Rehab Potential  Good    PT Frequency  2x / week    PT Duration  8 weeks    PT Treatment/Interventions  ADLs/Self Care Home Management;Traction;Moist Heat;Electrical Stimulation;Cryotherapy;Therapeutic  exercise;Therapeutic activities;Functional mobility training;Neuromuscular re-education;Patient/family education;Manual techniques;Passive range of motion;Dry needling;Taping    PT Next Visit Plan  DN cervical spine and sub-occipitals; STM and manual to cervical/thoracic spine as needed; lumbar stretches, posture strengthening    PT Home Exercise Plan  [email or web address]    Recommended Other Services  initial order is signed    Consulted and Agree with Plan of Care  Patient       Patient will benefit from skilled therapeutic intervention in order to improve the following deficits and impairments:  Decreased activity tolerance, Decreased strength, Impaired flexibility, Postural dysfunction, Pain, Improper body mechanics, Decreased range of motion, Increased muscle spasms, Hypomobility  Visit Diagnosis: Cervicalgia  Left shoulder pain, unspecified chronicity  Chronic low back pain, unspecified back pain laterality, with sciatica presence unspecified  Muscle spasm of back     Problem List Patient Active Problem List   Diagnosis Date Noted  . Closed fracture of 9th & 10th ribs of right side 12/17/2016  . Pneumothorax on right 12/17/2016  . Lumbar degenerative disc disease 12/17/2016  . Essential hypertension 12/17/2016  . Dyslipidemia 10/07/2016  . Pulmonary nodule 10/07/2016  . Abnormal CXR   . Chest pain 10/06/2016  . S/P lumbar spinal fusion 08/05/2016  . Obstructive sleep apnea syndrome, mild 10/27/2013  . Controlled type 2 diabetes mellitus without complication, without long-term current use of insulin ([place]) 06/19/2011  . GAD (generalized anxiety disorder) 11/01/2009     Sigurd Sos, PT 12/15/17 11:07 AM  [facility] [address] [place], [place], [address] Phone: [phone]   Fax:  [phone]  Name: DESSIREE SZE MRN: [id number] Date of Birth: [birth year]

## 2017-12-22 ENCOUNTER — Ambulatory Visit: Payer: Medicare Other | Admitting: Physical Therapy

## 2017-12-22 ENCOUNTER — Encounter: Payer: Self-pay | Admitting: Physical Therapy

## 2017-12-22 DIAGNOSIS — M542 Cervicalgia: Secondary | ICD-10-CM

## 2017-12-22 DIAGNOSIS — G8929 Other chronic pain: Secondary | ICD-10-CM

## 2017-12-22 DIAGNOSIS — M545 Low back pain: Secondary | ICD-10-CM

## 2017-12-22 DIAGNOSIS — M25512 Pain in left shoulder: Secondary | ICD-10-CM

## 2017-12-22 DIAGNOSIS — M62830 Muscle spasm of back: Secondary | ICD-10-CM

## 2017-12-22 NOTE — Therapy (Signed)
[facility] [address] [place], [place], [address] Phone: [phone]   Fax:  [phone]  Physical Therapy Treatment  Patient Details  Name: Krista Austin MRN: [id number] Date of Birth: January 20, 1946 Referring Provider: Kary Kos, MD    Encounter Date: 12/22/2017  PT End of Session - 12/22/17 1047    Visit Number  6    Date for PT Re-Evaluation  01/25/18    Authorization Type  Medicare A and B    Authorization Time Period  11/24/17 to 01/25/18    PT Start Time  1016    PT Stop Time  1055    PT Time Calculation (min)  39 min    Activity Tolerance  No increased pain;Patient tolerated treatment well    Behavior During Therapy  [facility] for tasks assessed/performed       Past Medical History:  Diagnosis Date  . Adenomatous polyp   . Arthritis    "mostly fingers" (10/07/2016)  . At high risk for falls   . 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 Chronic back pain    "worse in my neck; lower back also affected" (10/07/2016)  . Chronic cholecystitis 10/16/2016  . Complication of anesthesia    "never fully regained my taste after my cervical fusion; I don't take much RX so I'm very sensitive to any sedation" (10/07/2016)  . Concussion    X2  . DDD (degenerative disc disease), cervical    lumbar  . Depression    "hx; take Prozac to keep me stable" (10/07/2016)  . Dizziness    caused by compression of cervical disc  . Family history of adverse reaction to anesthesia    "daughters get PONV; one daughter breaks out severely; grandson got suspended in a state of anxiousness after kidney surgery; had to be held for hours"  . GERD (gastroesophageal reflux disease)   . Hyperlipidemia   . OSA on CPAP   . Pneumonia    "once as an adult" (10/07/2016)  . Thyroid nodule   . Type II diabetes mellitus ([place])    controlled by diet and exercide    Past Surgical History:  Procedure  Laterality Date  . ANTERIOR CERVICAL DECOMP/DISCECTOMY FUSION  2015  . BACK SURGERY    . CARPAL TUNNEL RELEASE Bilateral   . CATARACT EXTRACTION, BILATERAL Bilateral 09/10/2014 - 09/24/2014   by Darleen Crocker  . CHOLECYSTECTOMY N/A 10/16/2016   Procedure: LAPAROSCOPIC CHOLECYSTECTOMY;  Surgeon: [name], MD;  Location: [place];  Service: General;  Laterality: N/A;  . LAPAROSCOPIC CHOLECYSTECTOMY  10/16/2016  . [place] SURGERY  2007  . TONSILLECTOMY  05/05/1983  . TUBAL LIGATION  02/09/1979    There were no vitals filed for this visit.  Subjective Assessment - 12/22/17 1018    Subjective  Pt reports that her neck is stiff in the mornings, but no worse. She is now able to tolerate pressure on the base of her neck. She will notice her Lt shoulder bothers her during the day.     Pertinent History  ACDF C5/C6 2015; Lumbar surgery; arthritis    Currently in Pain?  No/denies         [facility] PT Assessment - 12/22/17 0001      Strength   Right Shoulder External Rotation  5/5    Left Shoulder Flexion  5/5    Left Shoulder ABduction  4/5    Left  Shoulder Internal Rotation  5/5    Left Shoulder External Rotation  4/5      Palpation   Palpation comment  tenderness along Lt bicep tendon, wrist extensors                    [facility] Adult PT Treatment/Exercise - 12/22/17 0001      Exercises   Exercises  Neck      Neck Exercises: Machines for Strengthening   Nustep  L1 x6 min, PT present to discuss progress       Neck Exercises: Seated   Cervical Isometrics  Right lateral flexion;Left lateral flexion;Right rotation;Left rotation;5 secs;10 reps;Flexion    Cervical Rotation  Both;5 reps    Cervical Rotation Limitations  with over pressure     Other Seated Exercise  Lt shoulder external rotation with yellow TB 2x10     Other Seated Exercise  Lt wrist pronation and supination with yellow TB x10 reps each       Neck Exercises: Supine   Shoulder Flexion  Left;15 reps     Shoulder Flexion Limitations  yellow TB    Other Supine Exercise  BUE flexion with yellow TB around wrists 2x5 reps              PT Education - 12/22/17 1046    Education Details  technique with therex; updates to HEP    Person(s) Educated  Patient    Methods  Explanation;Verbal cues;Handout    Comprehension  Verbalized understanding;Returned demonstration       PT Short Term Goals - 12/15/17 1040      PT SHORT TERM GOAL #2   Title  Pt will report atleast 30% decrease in her neck and shoulder pain from the start of PT which will allow her to complete daily housework with less difficulty.     Baseline  60-70% in neck, no change in Lt shoulder    Time  4    Period  Weeks    Status  On-going        PT Long Term Goals - 11/24/17 1639      PT LONG TERM GOAL #1   Title  Pt will demo improved Lt shoulder strength to 5/5 MMT which will impove her efficiency with daily activity.     Time  8    Period  Weeks    Status  New      PT LONG TERM GOAL #2   Title  Pt will report atleast 60% improvement in cervical tightness/discomfort with daily activity.    Time  8    Period  Weeks    Status  New      PT LONG TERM GOAL #3   Title  Pt will demo improved thoracic active ROM to atleast 45 deg each direction which will assist with shoulder and neck ROM.     Time  8    Period  Weeks    Status  New      PT LONG TERM GOAL #4   Title  Pt will be independent with her advanced HEP and verbalize understanding of exercise progressions which will allow her to maintain progress after discharge from PT.     Time  8    Period  Weeks    Status  New            Plan - 12/22/17 1102    Clinical Impression Statement  Pt's primary complaint is Lt shoulder pain with several  exercises and home activities completed throughout the day. Pt's technique was reviewed and adjustments were made with good understanding demonstrated. Pt's shoulder strength on the Rt is improved and there are remaining  limitations in Lt shoulder abduction and external rotation. Pt's HEP was updated to progress cervical muscle strength and increase shoulder resistance. Some additional cuing was required to improve technique, but there was good understanding noted end of session. No increase in pain was reported.     Rehab Potential  Good    PT Frequency  2x / week    PT Duration  8 weeks    PT Treatment/Interventions  ADLs/Self Care Home Management;Traction;Moist Heat;Electrical Stimulation;Cryotherapy;Therapeutic exercise;Therapeutic activities;Functional mobility training;Neuromuscular re-education;Patient/family education;Manual techniques;Passive range of motion;Dry needling;Taping    PT Next Visit Plan  DN cervical spine and sub-occipitals; STM and manual to cervical/thoracic spine as needed; lumbar stretches, posture strengthening    PT Home Exercise Plan  [email or web address]    Consulted and Agree with Plan of Care  Patient       Patient will benefit from skilled therapeutic intervention in order to improve the following deficits and impairments:  Decreased activity tolerance, Decreased strength, Impaired flexibility, Postural dysfunction, Pain, Improper body mechanics, Decreased range of motion, Increased muscle spasms, Hypomobility  Visit Diagnosis: Cervicalgia  Left shoulder pain, unspecified chronicity  Chronic low back pain, unspecified back pain laterality, with sciatica presence unspecified  Muscle spasm of back     Problem List Patient Active Problem List   Diagnosis Date Noted  . Closed fracture of 9th & 10th ribs of right side 12/17/2016  . Pneumothorax on right 12/17/2016  . Lumbar degenerative disc disease 12/17/2016  . Essential hypertension 12/17/2016  . Dyslipidemia 10/07/2016  . Pulmonary nodule 10/07/2016  . Abnormal CXR   . Chest pain 10/06/2016  . S/P lumbar spinal fusion 08/05/2016  . Obstructive sleep apnea syndrome, mild 10/27/2013  . Controlled type 2 diabetes mellitus  without complication, without long-term current use of insulin ([place]) 06/19/2011  . GAD (generalized anxiety disorder) 11/01/2009   11:14 AM,12/22/17 Sherol Dade PT, DPT [place] [facility] [address], [place], [place], [address] Phone: [phone]   Fax:  [phone]  Name: Krista Austin MRN: [id number] Date of Birth: January 23, 1946

## 2017-12-24 ENCOUNTER — Encounter: Payer: Medicare Other | Admitting: Physical Therapy

## 2017-12-28 ENCOUNTER — Encounter: Payer: Self-pay | Admitting: Physical Therapy

## 2017-12-28 ENCOUNTER — Ambulatory Visit: Payer: Medicare Other | Admitting: Physical Therapy

## 2017-12-28 DIAGNOSIS — M542 Cervicalgia: Secondary | ICD-10-CM

## 2017-12-28 DIAGNOSIS — M545 Low back pain: Secondary | ICD-10-CM

## 2017-12-28 DIAGNOSIS — M25512 Pain in left shoulder: Secondary | ICD-10-CM

## 2017-12-28 DIAGNOSIS — G8929 Other chronic pain: Secondary | ICD-10-CM

## 2017-12-28 DIAGNOSIS — M62830 Muscle spasm of back: Secondary | ICD-10-CM

## 2017-12-28 NOTE — Therapy (Signed)
[facility] [address] [place], [place], [address] Phone: [phone]   Fax:  [phone]  Physical Therapy Treatment  Patient Details  Name: Krista Austin MRN: [id number] Date of Birth: 20-Jul-1945 Referring Provider: Kary Kos, MD    Encounter Date: 12/28/2017  PT End of Session - 12/28/17 1611    Visit Number  7    Date for PT Re-Evaluation  01/25/18    Authorization Type  Medicare A and B    Authorization Time Period  11/24/17 to 01/25/18    PT Start Time  4081    PT Stop Time  1610    PT Time Calculation (min)  39 min    Activity Tolerance  No increased pain;Patient tolerated treatment well    Behavior During Therapy  [facility] for tasks assessed/performed       Past Medical History:  Diagnosis Date  . Adenomatous polyp   . Arthritis    "mostly fingers" (10/07/2016)  . At high risk for falls   . 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 Chronic back pain    "worse in my neck; lower back also affected" (10/07/2016)  . Chronic cholecystitis 10/16/2016  . Complication of anesthesia    "never fully regained my taste after my cervical fusion; I don't take much RX so I'm very sensitive to any sedation" (10/07/2016)  . Concussion    X2  . DDD (degenerative disc disease), cervical    lumbar  . Depression    "hx; take Prozac to keep me stable" (10/07/2016)  . Dizziness    caused by compression of cervical disc  . Family history of adverse reaction to anesthesia    "daughters get PONV; one daughter breaks out severely; grandson got suspended in a state of anxiousness after kidney surgery; had to be held for hours"  . GERD (gastroesophageal reflux disease)   . Hyperlipidemia   . OSA on CPAP   . Pneumonia    "once as an adult" (10/07/2016)  . Thyroid nodule   . Type II diabetes mellitus ([place])    controlled by diet and exercide    Past Surgical History:  Procedure  Laterality Date  . ANTERIOR CERVICAL DECOMP/DISCECTOMY FUSION  2015  . BACK SURGERY    . CARPAL TUNNEL RELEASE Bilateral   . CATARACT EXTRACTION, BILATERAL Bilateral 09/10/2014 - 09/24/2014   by Darleen Crocker  . CHOLECYSTECTOMY N/A 10/16/2016   Procedure: LAPAROSCOPIC CHOLECYSTECTOMY;  Surgeon: [name], MD;  Location: [place];  Service: General;  Laterality: N/A;  . LAPAROSCOPIC CHOLECYSTECTOMY  10/16/2016  . [place] SURGERY  2007  . TONSILLECTOMY  05/05/1983  . TUBAL LIGATION  02/09/1979    There were no vitals filed for this visit.  Subjective Assessment - 12/28/17 1537    Subjective  Pt reports that her shoulder was sore for a day following her last session. She took her exercises easy. No pain currently. She feels that her neck is 80-90% improved overall and her leg is 90-95% improved as well.     Pertinent History  ACDF C5/C6 2015; Lumbar surgery; arthritis    Currently in Pain?  No/denies                       [facility] Adult PT Treatment/Exercise - 12/28/17 0001      Exercises   Exercises  Shoulder;Elbow      Shoulder  Exercises: Pulleys   Flexion  2 minutes    ABduction  2 minutes      Shoulder Exercises: Stretch   Other Shoulder Stretches  Lt wrist extensor stretch 2x10 sec       Manual Therapy   Joint Mobilization  Lt elbow lateral mobilization with movement during elbow flexion x10 reps, Lt GH grade III-IV inferior mobilization x3 bouts    Soft tissue mobilization  STM Lt wrist extensors, Lt bicep, Lt deltoid (anterior, middle)     Myofascial Release  Trigger point release Lt bicep              PT Education - 12/28/17 1610    Education Details  technique with therex; possibility of extending PT POC to address shoulder pain remaining     Person(s) Educated  Patient    Methods  Explanation    Comprehension  Verbalized understanding       PT Short Term Goals - 12/28/17 1614      PT SHORT TERM GOAL #1   Title  Pt will be independent  with her initial HEP to decrease pain and improve cervical ROM.     Time  4    Period  Weeks    Status  Achieved      PT SHORT TERM GOAL #2   Title  Pt will report atleast 30% decrease in her neck and shoulder pain from the start of PT which will allow her to complete daily housework with less difficulty.     Time  4    Period  Weeks    Status  Achieved      PT SHORT TERM GOAL #3   Title  Pt will demo improved cervical active rotation to atleast 60 deg each direction which will allow her to check her blind spot while driving.     Time  4    Period  Weeks    Status  On-going        PT Long Term Goals - 12/28/17 1614      PT LONG TERM GOAL #1   Title  Pt will demo improved Lt shoulder strength to 5/5 MMT which will impove her efficiency with daily activity.     Time  8    Period  Weeks    Status  New      PT LONG TERM GOAL #2   Title  Pt will report atleast 60% improvement in cervical tightness/discomfort with daily activity.    Baseline  80-90% improvement    Time  8    Period  Weeks    Status  Achieved      PT LONG TERM GOAL #3   Title  Pt will demo improved thoracic active ROM to atleast 45 deg each direction which will assist with shoulder and neck ROM.     Time  8    Period  Weeks    Status  New      PT LONG TERM GOAL #4   Title  Pt will be independent with her advanced HEP and verbalize understanding of exercise progressions which will allow her to maintain progress after discharge from PT.     Time  8    Period  Weeks    Status  New            Plan - 12/28/17 1611    Clinical Impression Statement  Pt feels that her neck and low back are 80-90% improved overall. Her Lt shoulder and  elbow continue to give her problems. She does have palpable trigger points in the Lt bicep and wrist extensors. Therapist completed soft tissue mobilization to the Lt forearm and shoulder as well as inferior glenohumeral mobilization to improve shoulder mobility. Pt reported  decrease in shoulder and elbow pain with mobilization techniques completed. Ended session without increase in pain and pt was encouraged to apply modality intervention at home as needed.     Rehab Potential  Good    PT Frequency  2x / week    PT Duration  8 weeks    PT Treatment/Interventions  ADLs/Self Care Home Management;Traction;Moist Heat;Electrical Stimulation;Cryotherapy;Therapeutic exercise;Therapeutic activities;Functional mobility training;Neuromuscular re-education;Patient/family education;Manual techniques;Passive range of motion;Dry needling;Taping    PT Next Visit Plan  re-eval; possible extension for Lt shoulder; STM and manual to cervical/thoracic spine as needed; lumbar stretches, posture strengthening    PT Home Exercise Plan  [email or web address]    Consulted and Agree with Plan of Care  Patient       Patient will benefit from skilled therapeutic intervention in order to improve the following deficits and impairments:  Decreased activity tolerance, Decreased strength, Impaired flexibility, Postural dysfunction, Pain, Improper body mechanics, Decreased range of motion, Increased muscle spasms, Hypomobility  Visit Diagnosis: Cervicalgia  Left shoulder pain, unspecified chronicity  Chronic low back pain, unspecified back pain laterality, with sciatica presence unspecified  Muscle spasm of back     Problem List Patient Active Problem List   Diagnosis Date Noted  . Closed fracture of 9th & 10th ribs of right side 12/17/2016  . Pneumothorax on right 12/17/2016  . Lumbar degenerative disc disease 12/17/2016  . Essential hypertension 12/17/2016  . Dyslipidemia 10/07/2016  . Pulmonary nodule 10/07/2016  . Abnormal CXR   . Chest pain 10/06/2016  . S/P lumbar spinal fusion 08/05/2016  . Obstructive sleep apnea syndrome, mild 10/27/2013  . Controlled type 2 diabetes mellitus without complication, without long-term current use of insulin ([place]) 06/19/2011  . GAD (generalized anxiety  disorder) 11/01/2009     4:22 PM,12/28/17 Sherol Dade PT, DPT [place] [facility] [address] [place], [place], [address] Phone: [phone]   Fax:  [phone]  Name: QUNISHA BRYK MRN: [id number] Date of Birth: [birth year]

## 2017-12-30 ENCOUNTER — Ambulatory Visit: Payer: Medicare Other | Admitting: Physical Therapy

## 2017-12-30 ENCOUNTER — Encounter: Payer: Self-pay | Admitting: Physical Therapy

## 2017-12-30 DIAGNOSIS — M62830 Muscle spasm of back: Secondary | ICD-10-CM

## 2017-12-30 DIAGNOSIS — M542 Cervicalgia: Secondary | ICD-10-CM | POA: Diagnosis not present

## 2017-12-30 DIAGNOSIS — M25512 Pain in left shoulder: Secondary | ICD-10-CM

## 2017-12-30 DIAGNOSIS — M545 Low back pain: Secondary | ICD-10-CM

## 2017-12-30 DIAGNOSIS — G8929 Other chronic pain: Secondary | ICD-10-CM

## 2017-12-30 NOTE — Therapy (Signed)
[facility] [address], [place], [place], [address] Phone: [phone]   Fax:  [phone]  Physical Therapy Treatment/Re-evaluation  Patient Details  Name: Krista Austin MRN: [id number] Date of Birth: Mar 12, 1946 Referring Provider: Kary Kos, MD    Encounter Date: 12/30/2017  PT End of Session - 12/30/17 1057    Visit Number  8    Date for PT Re-Evaluation  01/25/18    Authorization Type  Medicare A and B    Authorization Time Period  01/26/18 to 02/25/18    PT Start Time  1016    PT Stop Time  1057    PT Time Calculation (min)  41 min    Activity Tolerance  No increased pain;Patient tolerated treatment well    Behavior During Therapy  [facility] for tasks assessed/performed       Past Medical History:  Diagnosis Date  . Adenomatous polyp   . Arthritis    "mostly fingers" (10/07/2016)  . At high risk for falls   . 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 Chronic back pain    "worse in my neck; lower back also affected" (10/07/2016)  . Chronic cholecystitis 10/16/2016  . Complication of anesthesia    "never fully regained my taste after my cervical fusion; I don't take much RX so I'm very sensitive to any sedation" (10/07/2016)  . Concussion    X2  . DDD (degenerative disc disease), cervical    lumbar  . Depression    "hx; take Prozac to keep me stable" (10/07/2016)  . Dizziness    caused by compression of cervical disc  . Family history of adverse reaction to anesthesia    "daughters get PONV; one daughter breaks out severely; grandson got suspended in a state of anxiousness after kidney surgery; had to be held for hours"  . GERD (gastroesophageal reflux disease)   . Hyperlipidemia   . OSA on CPAP   . Pneumonia    "once as an adult" (10/07/2016)  . Thyroid nodule   . Type II diabetes mellitus ([place])    controlled by diet and exercide    Past Surgical History:   Procedure Laterality Date  . ANTERIOR CERVICAL DECOMP/DISCECTOMY FUSION  2015  . BACK SURGERY    . CARPAL TUNNEL RELEASE Bilateral   . CATARACT EXTRACTION, BILATERAL Bilateral 09/10/2014 - 09/24/2014   by Darleen Crocker  . CHOLECYSTECTOMY N/A 10/16/2016   Procedure: LAPAROSCOPIC CHOLECYSTECTOMY;  Surgeon: [name], MD;  Location: [place];  Service: General;  Laterality: N/A;  . LAPAROSCOPIC CHOLECYSTECTOMY  10/16/2016  . [place] SURGERY  2007  . TONSILLECTOMY  05/05/1983  . TUBAL LIGATION  02/09/1979    There were no vitals filed for this visit.  Subjective Assessment - 12/30/17 1020    Subjective  Pt reports that her arm was sore following her last session, but today it is all feeling really good today. She is hoping to focus more on her shoulder moving forward since she thinks her neck and back are good enough to keep working at home.      Pertinent History  ACDF C5/C6 2015; Lumbar surgery; arthritis    Currently in Pain?  No/denies         [facility] PT Assessment - 12/30/17 0001      Assessment   Medical Diagnosis  cervicalgia; lumbago    Referring Provider  Kary Kos, MD  Hand Dominance  Left    Next MD Visit  01/04/18    Prior Therapy  several years ago OPPT      Precautions   Precautions  None      Restrictions   Weight Bearing Restrictions  No      Balance Screen   Has the patient fallen in the past 6 months  No    Has the patient had a decrease in activity level because of a fear of falling?   No    Is the patient reluctant to leave their home because of a fear of falling?   No      Prior Function   Leisure  does alot of work around the house, cares for her son      Cognition   Overall Cognitive Status  Within Functional Limits for tasks assessed      Sensation   Additional Comments  denies numbness/tingling in LE      Posture/Postural Control   Posture Comments  rounded shoulders       ROM / Strength   AROM / PROM / Strength  AROM      AROM    Overall AROM Comments  Lt shoulder reach behind head unable to reach C7; Lt shoulder pain and unable to reach behind back below bra strap     Cervical Flexion  25   pulling Lt side of neck    Cervical Extension  30   soreness end range   Cervical - Right Side Bend  20    Cervical - Left Side Bend  20    Cervical - Right Rotation  45    Cervical - Left Rotation  45    Lumbar Flexion  no pain reported    Lumbar Extension  no pain reported       Strength   Right Shoulder Flexion  5/5    Right Shoulder ABduction  5/5    Right Shoulder Internal Rotation  5/5    Right Shoulder External Rotation  5/5    Left Shoulder Flexion  5/5    Left Shoulder ABduction  4/5   pain   Left Shoulder Internal Rotation  5/5   (+) pain elbow   Left Shoulder External Rotation  4/5   (+) elbow pain    Right Elbow Flexion  5/5    Right Elbow Extension  5/5    Left Elbow Flexion  5/5    Left Elbow Extension  5/5    Right Hip Flexion  5/5    Right Hip Extension  5/5    Right Hip ABduction  5/5    Left Hip Flexion  5/5    Left Hip Extension  5/5    Left Hip ABduction  5/5    Right Knee Flexion  5/5    Right Knee Extension  5/5    Left Knee Flexion  5/5    Left Knee Extension  5/5      Flexibility   Soft Tissue Assessment /Muscle Length  yes    Hamstrings  WNL    Quadriceps  --   (+) thomas test Lt and Rt     Palpation   Palpation comment  tenderness along wrist extensors, along bicep, along anterior shoulder                    [facility] Adult PT Treatment/Exercise - 12/30/17 0001      Exercises   Exercises  Shoulder  Shoulder Exercises: Seated   Other Seated Exercises  Lt shoulder inferior mobilization at 90 deg abduction 3x10 reps       Manual Therapy   Joint Mobilization  Lt GH Grade III-IV inferior mobilization x3 bouts, Lt shoulder IR MWM 2x10 reps in scap plane     Soft tissue mobilization  STM Lt posterior shoulder, teres, infraspinatus, posterior deltoid              PT Education - 12/30/17 1101    Education Details  addition to HEP; discussed progress with cervical rehab and possible shoulder limitations contributing to remaining Lt shoulder pain     Person(s) Educated  Patient    Methods  Explanation;Verbal cues;Handout    Comprehension  Verbalized understanding;Returned demonstration       PT Short Term Goals - 12/30/17 1030      PT SHORT TERM GOAL #1   Title  Pt will be independent with her initial HEP to decrease pain and improve cervical ROM.     Time  4    Period  Weeks    Status  Achieved      PT SHORT TERM GOAL #2   Title  Pt will report atleast 30% decrease in her neck and shoulder pain from the start of PT which will allow her to complete daily housework with less difficulty.     Time  4    Period  Weeks    Status  Achieved      PT SHORT TERM GOAL #3   Title  Pt will demo improved cervical active rotation to atleast 60 deg each direction which will allow her to check her blind spot while driving.     Baseline  45 deg, but pt pleased with this     Time  4    Period  Weeks    Status  Achieved      PT SHORT TERM GOAL #4   Title  Pt will have improved Lt shoulder IR to atleast 40 deg which will improve her ability to reach behind her back to fasten her bra strap.      Time  4    Period  Weeks    Status  New    Target Date  03/03/18        PT Long Term Goals - 12/30/17 1031      PT LONG TERM GOAL #1   Title  Pt will demo improved Lt shoulder strength to 5/5 MMT which will impove her efficiency with daily activity.     Baseline  Lt shoulder ER and abduction are still 4/5 MMT, flexion and IR are improved     Time  8    Period  Weeks    Status  Partially Met      PT LONG TERM GOAL #2   Title  Pt will report atleast 60% improvement in cervical tightness/discomfort with daily activity.    Baseline  80-90% improvement    Time  8    Period  Weeks    Status  Achieved      PT LONG TERM GOAL #3   Title  Pt  will demo improved thoracic active ROM to atleast 45 deg each direction which will assist with shoulder and neck ROM.     Time  8    Period  Weeks    Status  New      PT LONG TERM GOAL #4   Title  Pt will be independent with  her advanced HEP and verbalize understanding of exercise progressions which will allow her to maintain progress after discharge from PT.     Time  8    Period  Weeks    Status  On-going      PT LONG TERM GOAL #5   Title  Pt will report being able to complete repetitive shoulder movements such a vaccuming with 50% improvement.     Time  8    Period  Weeks    Status  New    Target Date  03/03/18            Plan - 12/30/17 1125    Clinical Impression Statement  Pt is pleased with the progress made on her neck and low back, feeling 80-90% improvement with this. Her Lt shoulder continues to be an issue for her, limiting her ability to complete housework and other heavy activity around the home for her family. She demonstrates improvements in cervical active ROM up to 5 and 10 degrees, and her Lt shoulder strength has improved into flexion and external rotation. She does still report shoulder pain with repetitive activity and with resisted external rotation and abduction. She has significant limitations in Lt shoulder rotation as well as elevation. Therapist completed soft tissue mobilization and joibt mobilization to the Lt shoulder this session and pt's HEP was updated. She would continue to benefit from skilled PT to further address Lt shoulder complaints present at her evaluation and to improve her UE functional use moving forward.     Rehab Potential  Good    PT Frequency  2x / week    PT Duration  8 weeks    PT Treatment/Interventions  ADLs/Self Care Home Management;Traction;Moist Heat;Electrical Stimulation;Cryotherapy;Therapeutic exercise;Therapeutic activities;Functional mobility training;Neuromuscular re-education;Patient/family education;Manual  techniques;Passive range of motion;Dry needling;Taping    PT Next Visit Plan  shoulder mobilization (inferior, posterior); STM and manual to bicep and posterior shoulder as needed; supine shoulder strength, serratus strength     PT Home Exercise Plan  [email or web address]    Consulted and Agree with Plan of Care  Patient       Patient will benefit from skilled therapeutic intervention in order to improve the following deficits and impairments:  Decreased activity tolerance, Decreased strength, Impaired flexibility, Postural dysfunction, Pain, Improper body mechanics, Decreased range of motion, Increased muscle spasms, Hypomobility  Visit Diagnosis: Left shoulder pain, unspecified chronicity  Cervicalgia  Chronic low back pain, unspecified back pain laterality, with sciatica presence unspecified  Muscle spasm of back     Problem List Patient Active Problem List   Diagnosis Date Noted  . Closed fracture of 9th & 10th ribs of right side 12/17/2016  . Pneumothorax on right 12/17/2016  . Lumbar degenerative disc disease 12/17/2016  . Essential hypertension 12/17/2016  . Dyslipidemia 10/07/2016  . Pulmonary nodule 10/07/2016  . Abnormal CXR   . Chest pain 10/06/2016  . S/P lumbar spinal fusion 08/05/2016  . Obstructive sleep apnea syndrome, mild 10/27/2013  . Controlled type 2 diabetes mellitus without complication, without long-term current use of insulin ([place]) 06/19/2011  . GAD (generalized anxiety disorder) 11/01/2009    11:36 AM,12/30/17 Sherol Dade PT, DPT [place] [address] [place], [place], [address] Phone: [phone]   Fax:  [phone]  Name: Krista Austin MRN: [id number] Date of Birth: 04-24-1946

## 2017-12-30 NOTE — Patient Instructions (Signed)
Access Code: [email or web address]  URL: [email or web address]  Date: 12/30/2017  Prepared by: Elly Modena   Exercises  Supine Lower Trunk Rotation - 15 reps - 2x daily - 7x weekly  Single Knee to Chest Stretch - 5 reps - 20 hold - 1x daily - 7x weekly  Sidelying Thoracic Lumbar Rotation - 15 reps - 2x daily - 7x weekly  Seated Single Arm Shoulder Row with Anchored Resistance - 15 reps - 2 sets - 2x daily - 7x weekly  Seated Isometric Cervical Rotation - 10 reps - 1 sets - 5 hold - 1x daily - 7x weekly  Seated Isometric Cervical Flexion - 10 reps - 1 sets - 5 hold - 1x daily - 7x weekly  Seated Isometric Cervical Sidebending - 10 reps - 1 sets - 5 hold - 1x daily - 7x weekly  Standing Cervical Rotation AROM with Overpressure - 10 reps - 5 hold - 1x daily - 7x weekly  Seated Shoulder External Rotation with Resistance - 10 reps - 2 sets - 1x daily - 7x weekly  Seated Shoulder Inferior Glide in Abduction Below 90 - 10 reps - 3 sets - 1x daily - 7x weekly    [facility] [address], [place], [place] [address] Phone # [phone] Fax [phone]

## 2018-01-14 ENCOUNTER — Encounter: Payer: Medicare Other | Admitting: Physical Therapy

## 2018-01-25 ENCOUNTER — Ambulatory Visit: Payer: Medicare Other

## 2018-02-01 ENCOUNTER — Ambulatory Visit: Payer: Medicare Other

## 2018-02-15 ENCOUNTER — Ambulatory Visit: Payer: Medicare Other | Attending: Neurosurgery

## 2018-02-15 DIAGNOSIS — M62830 Muscle spasm of back: Secondary | ICD-10-CM | POA: Insufficient documentation

## 2018-02-15 DIAGNOSIS — M25512 Pain in left shoulder: Secondary | ICD-10-CM | POA: Diagnosis present

## 2018-02-15 DIAGNOSIS — M542 Cervicalgia: Secondary | ICD-10-CM

## 2018-02-15 NOTE — Therapy (Signed)
[facility] [address] [place], [place], [address] Phone: [phone]   Fax:  [phone]  Physical Therapy Treatment  Patient Details  Name: Krista Austin MRN: [id number] Date of Birth: 10-26-1945 Referring Provider (PT): Kary Kos, MD    Encounter Date: 02/15/2018  PT End of Session - 02/15/18 1143    Visit Number  9    Date for PT Re-Evaluation  04/12/18    Authorization Type  Medicare A and B    PT Start Time  1100    PT Stop Time  1143    PT Time Calculation (min)  43 min       Past Medical History:  Diagnosis Date  . Adenomatous polyp   . Arthritis    "mostly fingers" (10/07/2016)  . At high risk for falls   . 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 Chronic back pain    "worse in my neck; lower back also affected" (10/07/2016)  . Chronic cholecystitis 10/16/2016  . Complication of anesthesia    "never fully regained my taste after my cervical fusion; I don't take much RX so I'm very sensitive to any sedation" (10/07/2016)  . Concussion    X2  . DDD (degenerative disc disease), cervical    lumbar  . Depression    "hx; take Prozac to keep me stable" (10/07/2016)  . Dizziness    caused by compression of cervical disc  . Family history of adverse reaction to anesthesia    "daughters get PONV; one daughter breaks out severely; grandson got suspended in a state of anxiousness after kidney surgery; had to be held for hours"  . GERD (gastroesophageal reflux disease)   . Hyperlipidemia   . OSA on CPAP   . Pneumonia    "once as an adult" (10/07/2016)  . Thyroid nodule   . Type II diabetes mellitus ([place])    controlled by diet and exercide    Past Surgical History:  Procedure Laterality Date  . ANTERIOR CERVICAL DECOMP/DISCECTOMY FUSION  2015  . BACK SURGERY    . CARPAL TUNNEL RELEASE Bilateral   . CATARACT EXTRACTION, BILATERAL Bilateral 09/10/2014 -  09/24/2014   by Darleen Crocker  . CHOLECYSTECTOMY N/A 10/16/2016   Procedure: LAPAROSCOPIC CHOLECYSTECTOMY;  Surgeon: [name], MD;  Location: [place];  Service: General;  Laterality: N/A;  . LAPAROSCOPIC CHOLECYSTECTOMY  10/16/2016  . [place] SURGERY  2007  . TONSILLECTOMY  05/05/1983  . TUBAL LIGATION  02/09/1979    There were no vitals filed for this visit.  Subjective Assessment - 02/15/18 1104    Subjective  Lapse in treatment since late August.   My son broke his Rt foot and I have had to help bathe and dress him.  Things are getting worse with this.      Pertinent History  ACDF C5/C6 2015; Lumbar surgery; arthritis    Diagnostic tests  MRI: DDD, not ready for surgery per pt report, NCV on Lt- mild carpal tunnel    Patient Stated Goals  reduce pain, avoid injection    Currently in Pain?  Yes    Pain Score  3     Pain Location  Neck    Pain Orientation  Left    Pain Descriptors / Indicators  Throbbing;Aching;Dull;Sharp    Pain Type  Chronic pain    Pain Onset  More than a month ago  Pain Frequency  Intermittent    Aggravating Factors   use of Lt UE, turning head, drying hair, cooking    Effect of Pain on Daily Activities  heat, relaxing, Flexeril    Multiple Pain Sites  Yes    Pain Score  2    Pain Location  Back    Pain Orientation  Right    Pain Descriptors / Indicators  Burning;Numbness    Pain Type  Chronic pain    Pain Radiating Towards  Rt leg    Pain Onset  More than a month ago    Pain Frequency  Intermittent    Aggravating Factors   standing for housework, walking    Pain Relieving Factors  supine to decompress         [facility] PT Assessment - 02/15/18 0001      Assessment   Medical Diagnosis  cervicalgia; lumbago    Referring Provider (PT)  Kary Kos, MD     Onset Date/Surgical Date  --   chronic   Hand Dominance  Left      Precautions   Precautions  None      Restrictions   Weight Bearing Restrictions  No      Prior Function   Leisure  does  alot of work around the house, cares for her son      Cognition   Overall Cognitive Status  Within Functional Limits for tasks assessed      ROM / Strength   AROM / PROM / Strength  AROM;PROM;Strength      AROM   Cervical Flexion  35    Cervical Extension  35    Cervical - Right Side Bend  15    Cervical - Left Side Bend  25    Cervical - Right Rotation  50    Cervical - Left Rotation  50      Strength   Left Shoulder Flexion  5/5    Left Shoulder ABduction  4+/5    Left Shoulder Internal Rotation  5/5    Left Shoulder External Rotation  4/5                   [facility] Adult PT Treatment/Exercise - 02/15/18 0001      Exercises   Exercises  Knee/Hip;Neck;Shoulder      Knee/Hip Exercises: Stretches   Active Hamstring Stretch  Both;20 seconds;3 reps      Knee/Hip Exercises: Aerobic   Nustep  LEvel 2x 6 minutes    Rt UE and bil legs only     Shoulder Exercises: Seated   Row  Strengthening;Both;20 reps;Theraband    Theraband Level (Shoulder Row)  Level 2 (Red)             PT Education - 02/15/18 1147    Education Details   Access Code: [email or web address]     Person(s) Educated  Patient    Methods  Explanation;Demonstration;Handout    Comprehension  Verbalized understanding;Returned demonstration       PT Short Term Goals - 12/30/17 1030      PT SHORT TERM GOAL #1   Title  Pt will be independent with her initial HEP to decrease pain and improve cervical ROM.     Time  4    Period  Weeks    Status  Achieved      PT SHORT TERM GOAL #2   Title  Pt will report atleast 30% decrease in her neck and shoulder pain from the  start of PT which will allow her to complete daily housework with less difficulty.     Time  4    Period  Weeks    Status  Achieved      PT SHORT TERM GOAL #3   Title  Pt will demo improved cervical active rotation to atleast 60 deg each direction which will allow her to check her blind spot while driving.     Baseline  45 deg, but pt pleased  with this     Time  4    Period  Weeks    Status  Achieved      PT SHORT TERM GOAL #4   Title  Pt will have improved Lt shoulder IR to atleast 40 deg which will improve her ability to reach behind her back to fasten her bra strap.      Time  4    Period  Weeks    Status  New    Target Date  03/03/18        PT Long Term Goals - 02/15/18 1116      PT LONG TERM GOAL #1   Title  Pt will demo improved Lt shoulder strength to 5/5 MMT which will impove her efficiency with daily activity including drying hair and cooking     Baseline  ER and abduction 4/5, all other 5/5    Time  8    Period  Weeks    Status  On-going    Target Date  04/12/18      PT LONG TERM GOAL #2   Title  Pt will report atleast 80-90% improvement in cervical tightness/discomfort with daily activity.    Baseline  60-70%    Time  8    Status  Revised    Target Date  04/12/18      PT LONG TERM GOAL #3   Title  reduce Rt LE pain by 50% with household tasks and care of son    Time  8    Period  Weeks    Status  New    Target Date  04/12/18      PT LONG TERM GOAL #4   Title  Pt will be independent with her advanced HEP and verbalize understanding of exercise progressions which will allow her to maintain progress after discharge from PT.     Time  8    Period  Weeks    Status  On-going    Target Date  04/12/18      PT LONG TERM GOAL #5   Title  Pt will report being able to complete repetitive shoulder movements such a vaccuming with 50% improvement.     Baseline  still very limited    Time  8    Period  Weeks    Status  On-going    Target Date  04/12/18            Plan - 02/15/18 1132    Clinical Impression Statement  Pt with lapse in treatment due to travel and her son had an injury.  Pain was better in the neck, Lt arm and Rt LE and now pain has increased due to helping her son get dressed and with bathing.  Pt reports 3-5/10 neck and Lt UE pain and low back pain and Lt LE pain.  Pt is limited use  of Lt UE due to pain and limited endurance with use.  Pt with recent increase in Rt LE pain that had  recently resolved.  Pt with chronic cervical and lumbar pain and will benefit from resuming PT for strength, flexibility and manual/modalities for pain management.      Rehab Potential  Good    PT Frequency  2x / week    PT Duration  8 weeks    PT Treatment/Interventions  ADLs/Self Care Home Management;Traction;Moist Heat;Electrical Stimulation;Cryotherapy;Therapeutic exercise;Therapeutic activities;Functional mobility training;Neuromuscular re-education;Patient/family education;Manual techniques;Passive range of motion;Dry needling;Taping    PT Next Visit Plan  cervical traction, core strength. Kt UE strength    PT Home Exercise Plan  [email or web address]    Recommended Other Services  initial and recert signed, new recert sent 57/32/20    Consulted and Agree with Plan of Care  Patient       Patient will benefit from skilled therapeutic intervention in order to improve the following deficits and impairments:  Decreased activity tolerance, Decreased strength, Impaired flexibility, Postural dysfunction, Pain, Improper body mechanics, Decreased range of motion, Increased muscle spasms, Hypomobility  Visit Diagnosis: Left shoulder pain, unspecified chronicity - Plan: PT plan of care cert/re-cert  Cervicalgia - Plan: PT plan of care cert/re-cert  Muscle spasm of back - Plan: PT plan of care cert/re-cert     Problem List Patient Active Problem List   Diagnosis Date Noted  . Closed fracture of 9th & 10th ribs of right side 12/17/2016  . Pneumothorax on right 12/17/2016  . Lumbar degenerative disc disease 12/17/2016  . Essential hypertension 12/17/2016  . Dyslipidemia 10/07/2016  . Pulmonary nodule 10/07/2016  . Abnormal CXR   . Chest pain 10/06/2016  . S/P lumbar spinal fusion 08/05/2016  . Obstructive sleep apnea syndrome, mild 10/27/2013  . Controlled type 2 diabetes mellitus without  complication, without long-term current use of insulin ([place]) 06/19/2011  . GAD (generalized anxiety disorder) 11/01/2009     Sigurd Sos, PT 02/15/18 11:55 AM  [facility] [address] [place], [place], [address] Phone: [phone]   Fax:  [phone]  Name: Krista Austin MRN: [id number] Date of Birth: 06/25/1945

## 2018-02-15 NOTE — Patient Instructions (Signed)
Access Code: [email or web address]  URL: [email or web address]  Date: 02/15/2018  Prepared by: Sigurd Sos     Seated Hamstring Stretch - 3 reps - 20 hold - 3x daily - 7x weekly

## 2018-02-22 ENCOUNTER — Ambulatory Visit: Payer: Medicare Other

## 2018-02-22 DIAGNOSIS — M25512 Pain in left shoulder: Secondary | ICD-10-CM

## 2018-02-22 DIAGNOSIS — M62830 Muscle spasm of back: Secondary | ICD-10-CM

## 2018-02-22 DIAGNOSIS — M542 Cervicalgia: Secondary | ICD-10-CM

## 2018-02-22 NOTE — Patient Instructions (Signed)
Access Code: [email or web address]  URL: [email or web address]  Date: 02/22/2018  Prepared by: Sigurd Sos   Exercises Seated Cervical Flexion AROM - 3 reps - 1 sets - 20 hold - 3x daily - 7x weekly  Seated Cervical Sidebending AROM - 3 reps - 1 sets - 20 hold - 3x daily - 7x weekly  Seated Cervical Rotation AROM - 3 reps - 1 sets - 20 hold - 3x daily - 7x weekly  Seated Correct Posture - 10 reps - 3 sets - 1x daily - 7x weekly

## 2018-02-22 NOTE — Therapy (Signed)
[facility] [address] [place], [place], [address] Phone: [phone]   Fax:  [phone]  Physical Therapy Treatment  Patient Details  Name: Krista Austin MRN: [id number] Date of Birth: 1945-07-20 Referring Provider (PT): Kary Kos, MD    Encounter Date: 02/22/2018 Progress Note Reporting Period 11/24/17 to 02/22/18  See note below for Objective Data and Assessment of Progress/Goals.   Pt with inconsistent progress over 10 visits. Was making steady progress and had a decline due to caring for her son after an injury.     PT End of Session - 02/22/18 1144    Visit Number  10    Date for PT Re-Evaluation  04/12/18    Authorization Type  Medicare A and B    PT Start Time  1100    PT Stop Time  1146    PT Time Calculation (min)  46 min    Activity Tolerance  No increased pain;Patient tolerated treatment well    Behavior During Therapy  [facility] for tasks assessed/performed       Past Medical History:  Diagnosis Date  . Adenomatous polyp   . Arthritis    "mostly fingers" (10/07/2016)  . At high risk for falls   . 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 Chronic back pain    "worse in my neck; lower back also affected" (10/07/2016)  . Chronic cholecystitis 10/16/2016  . Complication of anesthesia    "never fully regained my taste after my cervical fusion; I don't take much RX so I'm very sensitive to any sedation" (10/07/2016)  . Concussion    X2  . DDD (degenerative disc disease), cervical    lumbar  . Depression    "hx; take Prozac to keep me stable" (10/07/2016)  . Dizziness    caused by compression of cervical disc  . Family history of adverse reaction to anesthesia    "daughters get PONV; one daughter breaks out severely; grandson got suspended in a state of anxiousness after kidney surgery; had to be held for hours"  . GERD (gastroesophageal reflux disease)    . Hyperlipidemia   . OSA on CPAP   . Pneumonia    "once as an adult" (10/07/2016)  . Thyroid nodule   . Type II diabetes mellitus ([place])    controlled by diet and exercide    Past Surgical History:  Procedure Laterality Date  . ANTERIOR CERVICAL DECOMP/DISCECTOMY FUSION  2015  . BACK SURGERY    . CARPAL TUNNEL RELEASE Bilateral   . CATARACT EXTRACTION, BILATERAL Bilateral 09/10/2014 - 09/24/2014   by Darleen Crocker  . CHOLECYSTECTOMY N/A 10/16/2016   Procedure: LAPAROSCOPIC CHOLECYSTECTOMY;  Surgeon: [name], MD;  Location: [facility];  Service: General;  Laterality: N/A;  . LAPAROSCOPIC CHOLECYSTECTOMY  10/16/2016  . [place] SURGERY  2007  . TONSILLECTOMY  05/05/1983  . TUBAL LIGATION  02/09/1979    There were no vitals filed for this visit.  Subjective Assessment - 02/22/18 1104    Subjective  I have a shooting pain on the Lt side of my neck.      Patient Stated Goals  reduce pain, avoid injection    Currently in Pain?  Yes    Pain Score  3     Pain Location  Neck    Pain Orientation  Left    Pain Descriptors / Indicators  Shooting;Aching    Pain  Type  Chronic pain    Pain Onset  More than a month ago    Pain Frequency  Intermittent    Aggravating Factors   turning head, drying hair, using Lt UE    Pain Relieving Factors  exercise, stretching    Pain Score  3    Pain Location  Back    Pain Orientation  Right    Pain Descriptors / Indicators  Burning;Numbness    Pain Type  Chronic pain    Pain Onset  More than a month ago    Pain Frequency  Intermittent    Aggravating Factors   standing, walking    Pain Relieving Factors  supine                        [facility] Adult PT Treatment/Exercise - 02/22/18 0001      Neck Exercises: Machines for Strengthening   Nustep  L1 x 8 min, PT present to discuss progress       Modalities   Modalities  Moist Heat      Moist Heat Therapy   Number Minutes Moist Heat  15 Minutes    Moist Heat Location  Cervical       Manual Therapy   Soft tissue mobilization  soft tissue elongation to bil upper traps and bil cervical paraspinals       Neck Exercises: Stretches   Upper Trapezius Stretch  Left;Right;2 reps;20 seconds   cervical flexion and rotation 3x20 seconds      Trigger Point Dry Needling - 02/22/18 1105    Consent Given?  Yes    Muscles Treated Upper Body  Upper trapezius   Lt upper trap, bil multifidi   Upper Trapezius Response  Twitch reponse elicited;Palpable increased muscle length           PT Education - 02/22/18 1120    Education Details   Access Code: [email or web address]     Person(s) Educated  Patient    Methods  Explanation;Demonstration;Handout    Comprehension  Verbalized understanding;Returned demonstration       PT Short Term Goals - 12/30/17 1030      PT SHORT TERM GOAL #1   Title  Pt will be independent with her initial HEP to decrease pain and improve cervical ROM.     Time  4    Period  Weeks    Status  Achieved      PT SHORT TERM GOAL #2   Title  Pt will report atleast 30% decrease in her neck and shoulder pain from the start of PT which will allow her to complete daily housework with less difficulty.     Time  4    Period  Weeks    Status  Achieved      PT SHORT TERM GOAL #3   Title  Pt will demo improved cervical active rotation to atleast 60 deg each direction which will allow her to check her blind spot while driving.     Baseline  45 deg, but pt pleased with this     Time  4    Period  Weeks    Status  Achieved      PT SHORT TERM GOAL #4   Title  Pt will have improved Lt shoulder IR to atleast 40 deg which will improve her ability to reach behind her back to fasten her bra strap.      Time  4    Period  Weeks    Status  New    Target Date  03/03/18        PT Long Term Goals - 02/22/18 1145      PT LONG TERM GOAL #1   Title  Pt will demo improved Lt shoulder strength to 5/5 MMT which will impove her efficiency with daily activity including drying  hair and cooking     Baseline  ER and abduction 4/5, all other 5/5    Time  8    Period  Weeks    Status  On-going      PT LONG TERM GOAL #2   Title  Pt will report atleast 80-90% improvement in cervical tightness/discomfort with daily activity.    Baseline  60-70%    Time  8    Period  Weeks    Status  On-going      PT LONG TERM GOAL #3   Title  reduce Rt LE pain by 50% with household tasks and care of son    Time  8    Period  Weeks    Status  On-going      PT LONG TERM GOAL #4   Title  Pt will be independent with her advanced HEP and verbalize understanding of exercise progressions which will allow her to maintain progress after discharge from PT.     Time  8    Period  Weeks    Status  On-going      PT LONG TERM GOAL #5   Title  Pt will report being able to complete repetitive shoulder movements such a vaccuming with 50% improvement.     Baseline  still very limited    Time  8    Period  Weeks    Status  On-going            Plan - 02/22/18 1110    Clinical Impression Statement  Pt with Lt upper trap and neck pain today and describes as shooting and more frequent over the past week.  Pt with tension and trigger points in Lt upper trap and bil cervical paraspinals and demonstrated improved tissue mobility and reduced pain after dry needling and manual today.  Pt is independent and compliant in HEP for flexibility and strength.  Pt will continue to benefit from skilled PT for postural strength, core strength, flexibility and manual for tissue tension and pain.      Rehab Potential  Good    PT Frequency  2x / week    PT Duration  8 weeks    PT Next Visit Plan  assess response to dry needling, traction, core strength, manual, flexibility.      PT Home Exercise Plan  [email or web address]    Consulted and Agree with Plan of Care  Patient       Patient will benefit from skilled therapeutic intervention in order to improve the following deficits and impairments:  Decreased activity  tolerance, Decreased strength, Impaired flexibility, Postural dysfunction, Pain, Improper body mechanics, Decreased range of motion, Increased muscle spasms, Hypomobility  Visit Diagnosis: Left shoulder pain, unspecified chronicity  Cervicalgia  Muscle spasm of back     Problem List Patient Active Problem List   Diagnosis Date Noted  . Closed fracture of 9th & 10th ribs of right side 12/17/2016  . Pneumothorax on right 12/17/2016  . Lumbar degenerative disc disease 12/17/2016  . Essential hypertension 12/17/2016  . Dyslipidemia 10/07/2016  . Pulmonary nodule 10/07/2016  . Abnormal CXR   .  Chest pain 10/06/2016  . S/P lumbar spinal fusion 08/05/2016  . Obstructive sleep apnea syndrome, mild 10/27/2013  . Controlled type 2 diabetes mellitus without complication, without long-term current use of insulin ([place]) 06/19/2011  . GAD (generalized anxiety disorder) 11/01/2009    Sigurd Sos, PT 02/22/18 11:48 AM   [facility] [address] [place], [place], [address] Phone: [phone]   Fax:  [phone]  Name: JONETTE WASSEL MRN: [id number] Date of Birth: [birth year]

## 2018-03-01 ENCOUNTER — Ambulatory Visit: Payer: Medicare Other

## 2018-03-01 DIAGNOSIS — M25512 Pain in left shoulder: Secondary | ICD-10-CM | POA: Diagnosis not present

## 2018-03-01 DIAGNOSIS — M62830 Muscle spasm of back: Secondary | ICD-10-CM

## 2018-03-01 DIAGNOSIS — M542 Cervicalgia: Secondary | ICD-10-CM

## 2018-03-01 NOTE — Therapy (Signed)
[facility] [address], [place], [place], [address] Phone: [phone]   Fax:  [phone]  Physical Therapy Treatment  Patient Details  Name: Krista Austin MRN: [id number] Date of Birth: 1945-07-18 Referring Provider (PT): Kary Kos, MD    Encounter Date: 03/01/2018  PT End of Session - 03/01/18 1136    Visit Number  11    Date for PT Re-Evaluation  04/12/18    Authorization Type  Medicare A and B    PT Start Time  8891    PT Stop Time  6945   pt asked to leave early   PT Time Calculation (min)  33 min    Activity Tolerance  No increased pain;Patient tolerated treatment well    Behavior During Therapy  [facility] for tasks assessed/performed       Past Medical History:  Diagnosis Date  . Adenomatous polyp   . Arthritis    "mostly fingers" (10/07/2016)  . At high risk for falls   . 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 Chronic back pain    "worse in my neck; lower back also affected" (10/07/2016)  . Chronic cholecystitis 10/16/2016  . Complication of anesthesia    "never fully regained my taste after my cervical fusion; I don't take much RX so I'm very sensitive to any sedation" (10/07/2016)  . Concussion    X2  . DDD (degenerative disc disease), cervical    lumbar  . Depression    "hx; take Prozac to keep me stable" (10/07/2016)  . Dizziness    caused by compression of cervical disc  . Family history of adverse reaction to anesthesia    "daughters get PONV; one daughter breaks out severely; grandson got suspended in a state of anxiousness after kidney surgery; had to be held for hours"  . GERD (gastroesophageal reflux disease)   . Hyperlipidemia   . OSA on CPAP   . Pneumonia    "once as an adult" (10/07/2016)  . Thyroid nodule   . Type II diabetes mellitus ([place])    controlled by diet and exercide    Past Surgical History:  Procedure Laterality Date  .  ANTERIOR CERVICAL DECOMP/DISCECTOMY FUSION  2015  . BACK SURGERY    . CARPAL TUNNEL RELEASE Bilateral   . CATARACT EXTRACTION, BILATERAL Bilateral 09/10/2014 - 09/24/2014   by Darleen Crocker  . CHOLECYSTECTOMY N/A 10/16/2016   Procedure: LAPAROSCOPIC CHOLECYSTECTOMY;  Surgeon: [name], MD;  Location: [facility];  Service: General;  Laterality: N/A;  . LAPAROSCOPIC CHOLECYSTECTOMY  10/16/2016  . [place] SURG[facility]  2007  . TONSILLECTOMY  05/05/1983  . TUBAL LIGATION  02/09/1979    There were no vitals filed for this visit.  Subjective Assessment - 03/01/18 1104    Subjective  The long drive to [facility] was bad on my back.  My neck has felt so much better since the dry needling.  The Lt shooting pain is gone and hasn't come back.  My neck feels tense and I would like to work on those muscles today.      Pertinent History  ACDF C5/C6 2015; Lumbar surgery; arthritis    Diagnostic tests  MRI: DDD, not ready for surgery per pt report, NCV on Lt- mild carpal tunnel    Currently in Pain?  No/denies    Pain Score  --    Pain Location  --    Pain  Orientation  --    Pain Type  --    Pain Onset  --    Pain Frequency  --    Pain Score  1    Pain Location  Back    Pain Orientation  Right    Pain Descriptors / Indicators  Numbness;Burning    Pain Type  Chronic pain    Pain Onset  More than a month ago    Pain Frequency  Intermittent    Aggravating Factors   standing, housework, walking    Pain Relieving Factors  rest                       [facility] Adult PT Treatment/Exercise - 03/01/18 0001      Knee/Hip Exercises: Aerobic   Nustep  Level 2x 6 minutes       Manual Therapy   Soft tissue mobilization  soft tissue elongation to bil upper traps and bil cervical paraspinals       Neck Exercises: Stretches   Upper Trapezius Stretch  Left;Right;2 reps;20 seconds   cervical flexion and rotation 3x20 seconds              PT Short Term Goals - 12/30/17 1030      PT  SHORT TERM GOAL #1   Title  Pt will be independent with her initial HEP to decrease pain and improve cervical ROM.     Time  4    Period  Weeks    Status  Achieved      PT SHORT TERM GOAL #2   Title  Pt will report atleast 30% decrease in her neck and shoulder pain from the start of PT which will allow her to complete daily housework with less difficulty.     Time  4    Period  Weeks    Status  Achieved      PT SHORT TERM GOAL #3   Title  Pt will demo improved cervical active rotation to atleast 60 deg each direction which will allow her to check her blind spot while driving.     Baseline  45 deg, but pt pleased with this     Time  4    Period  Weeks    Status  Achieved      PT SHORT TERM GOAL #4   Title  Pt will have improved Lt shoulder IR to atleast 40 deg which will improve her ability to reach behind her back to fasten her bra strap.      Time  4    Period  Weeks    Status  New    Target Date  03/03/18        PT Long Term Goals - 02/22/18 1145      PT LONG TERM GOAL #1   Title  Pt will demo improved Lt shoulder strength to 5/5 MMT which will impove her efficiency with daily activity including drying hair and cooking     Baseline  ER and abduction 4/5, all other 5/5    Time  8    Period  Weeks    Status  On-going      PT LONG TERM GOAL #2   Title  Pt will report atleast 80-90% improvement in cervical tightness/discomfort with daily activity.    Baseline  60-70%    Time  8    Period  Weeks    Status  On-going      PT LONG  TERM GOAL #3   Title  reduce Rt LE pain by 50% with household tasks and care of son    Time  8    Period  Weeks    Status  On-going      PT LONG TERM GOAL #4   Title  Pt will be independent with her advanced HEP and verbalize understanding of exercise progressions which will allow her to maintain progress after discharge from PT.     Time  8    Period  Weeks    Status  On-going      PT LONG TERM GOAL #5   Title  Pt will report being able  to complete repetitive shoulder movements such a vaccuming with 50% improvement.     Baseline  still very limited    Time  8    Period  Weeks    Status  On-going            Plan - 03/01/18 1118    Clinical Impression Statement  Pt reports that Lt UE pain has resolved after dry needling.  Pt with bil neck tension and requested to address this tension today.  Pt is able to do NuStep x 10 minutes without increased arm pain today.  Pt is independent and compliant in her HEP for flexibility and strength.  Pt with tension and trigger points in bil neck and upper traps and demonstrated improved tissue mobility after manual therapy today.  Pt will continue to benefit from skilled PT for strength, flexibility and manual therapy as needed.      Rehab Potential  Good    PT Frequency  2x / week    PT Duration  8 weeks    PT Treatment/Interventions  ADLs/Self Care Home Management;Traction;Moist Heat;Electrical Stimulation;Cryotherapy;Therapeutic exercise;Therapeutic activities;Functional mobility training;Neuromuscular re-education;Patient/family education;Manual techniques;Passive range of motion;Dry needling;Taping    PT Next Visit Plan  dry needling as needed,  traction, core strength, manual, flexibility.      PT Home Exercise Plan  [email or web address]    Consulted and Agree with Plan of Care  Patient       Patient will benefit from skilled therapeutic intervention in order to improve the following deficits and impairments:  Decreased activity tolerance, Decreased strength, Impaired flexibility, Postural dysfunction, Pain, Improper body mechanics, Decreased range of motion, Increased muscle spasms, Hypomobility  Visit Diagnosis: Left shoulder pain, unspecified chronicity  Cervicalgia  Muscle spasm of back     Problem List Patient Active Problem List   Diagnosis Date Noted  . Closed fracture of 9th & 10th ribs of right side 12/17/2016  . Pneumothorax on right 12/17/2016  . Lumbar degenerative  disc disease 12/17/2016  . Essential hypertension 12/17/2016  . Dyslipidemia 10/07/2016  . Pulmonary nodule 10/07/2016  . Abnormal CXR   . Chest pain 10/06/2016  . S/P lumbar spinal fusion 08/05/2016  . Obstructive sleep apnea syndrome, mild 10/27/2013  . Controlled type 2 diabetes mellitus without complication, without long-term current use of insulin ([place]) 06/19/2011  . GAD (generalized anxiety disorder) 11/01/2009    Sigurd Sos, PT 03/01/18 11:41 AM  [facility] [address], [place], [place], [address] Phone: [phone]   Fax:  [phone]  Name: Krista Austin MRN: [id number] Date of Birth: June 27, 1945

## 2018-03-15 ENCOUNTER — Ambulatory Visit: Payer: Medicare Other | Admitting: Physical Therapy

## 2018-03-23 ENCOUNTER — Ambulatory Visit: Payer: Medicare Other | Attending: Neurosurgery

## 2018-03-23 DIAGNOSIS — M25512 Pain in left shoulder: Secondary | ICD-10-CM | POA: Diagnosis present

## 2018-03-23 DIAGNOSIS — M62830 Muscle spasm of back: Secondary | ICD-10-CM | POA: Diagnosis present

## 2018-03-23 DIAGNOSIS — M542 Cervicalgia: Secondary | ICD-10-CM | POA: Diagnosis present

## 2018-03-23 NOTE — Patient Instructions (Signed)
Access Code: [email or web address]  URL: [email or web address]  Date: 03/23/2018  Prepared by: Sigurd Sos   Exercises    Supine Shoulder Horizontal Abduction with Resistance - 10 reps - 2 sets - 2x daily - 7x weekly  Supine Bilateral Shoulder External Rotation with Resistance - 10 reps - 2 sets - 2x daily - 7x weekly  Supine PNF D2 Flexion with Resistance - 10 reps - 2 sets - 2x daily - 7x weekly

## 2018-03-23 NOTE — Therapy (Signed)
[facility] [address], [place], [place], [address] Phone: [phone]   Fax:  [phone]  Physical Therapy Treatment  Patient Details  Name: Krista Austin MRN: [id number] Date of Birth: 1946/02/13 Referring Provider (PT): Kary Kos, MD    Encounter Date: 03/23/2018  PT End of Session - 03/23/18 1221    Visit Number  12    PT Start Time  4132    PT Stop Time  1222    PT Time Calculation (min)  44 min    Activity Tolerance  No increased pain;Patient tolerated treatment well    Behavior During Therapy  [facility] for tasks assessed/performed       Past Medical History:  Diagnosis Date  . Adenomatous polyp   . Arthritis    "mostly fingers" (10/07/2016)  . At high risk for falls   . 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 Chronic back pain    "worse in my neck; lower back also affected" (10/07/2016)  . Chronic cholecystitis 10/16/2016  . Complication of anesthesia    "never fully regained my taste after my cervical fusion; I don't take much RX so I'm very sensitive to any sedation" (10/07/2016)  . Concussion    X2  . DDD (degenerative disc disease), cervical    lumbar  . Depression    "hx; take Prozac to keep me stable" (10/07/2016)  . Dizziness    caused by compression of cervical disc  . Family history of adverse reaction to anesthesia    "daughters get PONV; one daughter breaks out severely; grandson got suspended in a state of anxiousness after kidney surgery; had to be held for hours"  . GERD (gastroesophageal reflux disease)   . Hyperlipidemia   . OSA on CPAP   . Pneumonia    "once as an adult" (10/07/2016)  . Thyroid nodule   . Type II diabetes mellitus ([place])    controlled by diet and exercide    Past Surgical History:  Procedure Laterality Date  . ANTERIOR CERVICAL DECOMP/DISCECTOMY FUSION  2015  . BACK SURGERY    . CARPAL TUNNEL RELEASE Bilateral   .  CATARACT EXTRACTION, BILATERAL Bilateral 09/10/2014 - 09/24/2014   by Darleen Crocker  . CHOLECYSTECTOMY N/A 10/16/2016   Procedure: LAPAROSCOPIC CHOLECYSTECTOMY;  Surgeon: [name], MD;  Location: [facility];  Service: General;  Laterality: N/A;  . LAPAROSCOPIC CHOLECYSTECTOMY  10/16/2016  . [place] SURGERY  2007  . TONSILLECTOMY  05/05/1983  . TUBAL LIGATION  02/09/1979    There were no vitals filed for this visit.  Subjective Assessment - 03/23/18 1142    Subjective  I am feeling good.  I am ready to discharge to my homework.  I feel 75% overall improvement since the start of care.  I am doing my exercises consistently and they are helping.      Pertinent History  ACDF C5/C6 2015; Lumbar surgery; arthritis    Diagnostic tests  MRI: DDD, not ready for surgery per pt report, NCV on Lt- mild carpal tunnel    Patient Stated Goals  reduce pain, avoid injection         [facility] PT Assessment - 03/23/18 0001      Assessment   Medical Diagnosis  cervicalgia; lumbago      Cognition   Overall Cognitive Status  Within Functional Limits for tasks assessed      Strength  Left Shoulder Flexion  5/5    Left Shoulder ABduction  4+/5    Left Shoulder Internal Rotation  5/5    Left Shoulder External Rotation  4+/5                   [facility] Adult PT Treatment/Exercise - 03/23/18 0001      Exercises   Exercises  Knee/Hip;Neck;Shoulder      Shoulder Exercises: Supine   Horizontal ABduction  Strengthening;Both;20 reps;Theraband    Theraband Level (Shoulder Horizontal ABduction)  Level 2 (Red)    External Rotation  Strengthening;Both;20 reps;Theraband    Theraband Level (Shoulder External Rotation)  Level 2 (Red)    Diagonals  Strengthening;Both;20 reps    Theraband Level (Shoulder Diagonals)  Level 2 (Red)      Manual Therapy   Soft tissue mobilization  soft tissue elongation to bil upper traps and bil cervical paraspinals              PT Education - 03/23/18 1156     Education Details   Access Code: [email or web address]     Person(s) Educated  Patient    Methods  Explanation;Demonstration;Handout    Comprehension  Verbalized understanding;Returned demonstration       PT Short Term Goals - 12/30/17 1030      PT SHORT TERM GOAL #1   Title  Pt will be independent with her initial HEP to decrease pain and improve cervical ROM.     Time  4    Period  Weeks    Status  Achieved      PT SHORT TERM GOAL #2   Title  Pt will report atleast 30% decrease in her neck and shoulder pain from the start of PT which will allow her to complete daily housework with less difficulty.     Time  4    Period  Weeks    Status  Achieved      PT SHORT TERM GOAL #3   Title  Pt will demo improved cervical active rotation to atleast 60 deg each direction which will allow her to check her blind spot while driving.     Baseline  45 deg, but pt pleased with this     Time  4    Period  Weeks    Status  Achieved      PT SHORT TERM GOAL #4   Title  Pt will have improved Lt shoulder IR to atleast 40 deg which will improve her ability to reach behind her back to fasten her bra strap.      Time  4    Period  Weeks    Status  New    Target Date  03/03/18        PT Long Term Goals - 03/23/18 1145      PT LONG TERM GOAL #1   Title  Pt will demo improved Lt shoulder strength to 5/5 MMT which will impove her efficiency with daily activity including drying hair and cooking     Baseline  ER 4+/5, abduction 4/5    Status  Partially Met      PT LONG TERM GOAL #2   Title  Pt will report atleast 80-90% improvement in cervical tightness/discomfort with daily activity.    Baseline  75%     Status  Partially Met      PT LONG TERM GOAL #3   Title  reduce Rt LE pain by 50% with household tasks and care of  son    Status  Achieved      PT LONG TERM GOAL #4   Title  Pt will be independent with her advanced HEP and verbalize understanding of exercise progressions which will allow her to maintain  progress after discharge from PT.     Status  Achieved      PT LONG TERM GOAL #5   Title  Pt will report being able to complete repetitive shoulder movements such a vaccuming with 50% improvement.     Status  Achieved            Plan - 03/23/18 1201    Clinical Impression Statement  Pt reports 75% reduction in overall pain since the start of care.  Pt has improved Lt UE strength yet still limited in ER and abduction.  Pt required minor verbal cues for technique with supine exercises. Pt is making modifications to her activity and taking rests when needed to manage symptoms.  Pt has a comprehensive HEP in place for flexibility and strength and will continue with this program for continued gains.  Pt will follow-up with MD as needed.      PT Next Visit Plan  D/C PT to HEP today    PT Home Exercise Plan  [email or web address]    Consulted and Agree with Plan of Care  Patient       Patient will benefit from skilled therapeutic intervention in order to improve the following deficits and impairments:     Visit Diagnosis: Left shoulder pain, unspecified chronicity  Cervicalgia  Muscle spasm of back     Problem List Patient Active Problem List   Diagnosis Date Noted  . Closed fracture of 9th & 10th ribs of right side 12/17/2016  . Pneumothorax on right 12/17/2016  . Lumbar degenerative disc disease 12/17/2016  . Essential hypertension 12/17/2016  . Dyslipidemia 10/07/2016  . Pulmonary nodule 10/07/2016  . Abnormal CXR   . Chest pain 10/06/2016  . S/P lumbar spinal fusion 08/05/2016  . Obstructive sleep apnea syndrome, mild 10/27/2013  . Controlled type 2 diabetes mellitus without complication, without long-term current use of insulin ([place]) 06/19/2011  . GAD (generalized anxiety disorder) 11/01/2009   PHYSICAL THERAPY DISCHARGE SUMMARY  Visits from Start of Care: 7  Current functional level related to goals / functional outcomes: Pt reports 75% overall improvement and will discharge  to HEP.  See above for current status.     Remaining deficits: Pt with intermittent pain and endurance deficits due to chronic condition.  Pt has HEP and is pacing herself with activity   Education / Equipment: HEP, posture/body mechanics Plan: Patient agrees to discharge.  Patient goals were partially met. Patient is being discharged due to being pleased with the current functional level.  ?????        Sigurd Sos, PT 03/23/18 12:24 PM  [facility] [address], [place], [place], [address] Phone: [phone]   Fax:  [phone]  Name: Krista Austin MRN: [id number] Date of Birth: 16-Apr-1946

## 2018-03-28 ENCOUNTER — Ambulatory Visit: Payer: Medicare Other

## 2018-05-12 ENCOUNTER — Other Ambulatory Visit: Payer: Self-pay | Admitting: Thoracic Surgery (Cardiothoracic Vascular Surgery)

## 2018-05-12 DIAGNOSIS — R911 Solitary pulmonary nodule: Secondary | ICD-10-CM

## 2018-06-10 ENCOUNTER — Ambulatory Visit
Admission: RE | Admit: 2018-06-10 | Discharge: 2018-06-10 | Disposition: A | Discharge status: Home / Self Care | Payer: Medicare Other | Source: Ambulatory Visit | Attending: Thoracic Surgery (Cardiothoracic Vascular Surgery) | Admitting: Thoracic Surgery (Cardiothoracic Vascular Surgery)

## 2018-06-10 DIAGNOSIS — R911 Solitary pulmonary nodule: Secondary | ICD-10-CM

## 2018-06-14 ENCOUNTER — Encounter: Payer: Self-pay | Admitting: Thoracic Surgery (Cardiothoracic Vascular Surgery)

## 2018-06-14 ENCOUNTER — Ambulatory Visit (INDEPENDENT_AMBULATORY_CARE_PROVIDER_SITE_OTHER): Payer: Medicare Other | Admitting: Thoracic Surgery (Cardiothoracic Vascular Surgery)

## 2018-06-14 VITALS — BP 119/62 | HR 88 | Resp 20 | Ht 65.0 in | Wt 163.0 lb

## 2018-06-14 DIAGNOSIS — R911 Solitary pulmonary nodule: Secondary | ICD-10-CM

## 2018-06-14 NOTE — Progress Notes (Signed)
[address]       [place] [address]             [phone]     HPI: [name] returns for follow-up regarding abnormal chest CT  Krista Austin is a 73 year old former smoker (50-pack-year, quit 2018), type 2 diabetes, hyperlipidemia, chronic back pain, reflux, thyroid nodules, struct of sleep apnea and depression. She was being evaluated for chest pain in 2018.  A CT of the chest showed a 6 mm groundglass opacity in the right upper lobe.  A repeat CT and August showed resolution of the groundglass opacity.  She did have some persistent opacity in the left lung.  We did a repeat CT in February 2019 and the changes appeared stable.  She has not smoked in 2 years now.  She feels well.  She does still have some right sided rib pain associated with her previous fractures.  She has not had any respiratory issues.  Past Medical History:  Diagnosis Date  . Adenomatous polyp   . Arthritis    "mostly fingers" (10/07/2016)  . At high risk for falls   . 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 Chronic back pain    "worse in my neck; lower back also affected" (10/07/2016)  . Chronic cholecystitis 10/16/2016  . Complication of anesthesia    "never fully regained my taste after my cervical fusion; I don't take much RX so I'm very sensitive to any sedation" (10/07/2016)  . Concussion    X2  . DDD (degenerative disc disease), cervical    lumbar  . Depression    "hx; take Prozac to keep me stable" (10/07/2016)  . Dizziness    caused by compression of cervical disc  . Family history of adverse reaction to anesthesia    "daughters get PONV; one daughter breaks out severely; grandson got suspended in a state of anxiousness after kidney surgery; had to be held for hours"  . GERD (gastroesophageal reflux disease)   . Hyperlipidemia   . OSA on CPAP   . Pneumonia    "once as an adult" (10/07/2016)  . Thyroid nodule   . Type II  diabetes mellitus ([place])    controlled by diet and exercide    Current Outpatient Medications  Medication Sig Dispense Refill  . acetaminophen (TYLENOL) 500 MG tablet Take 1 tablet (500 mg total) by mouth every 4 (four) hours. 30 tablet 0  . aspirin EC 81 MG tablet Take 81 mg by mouth at bedtime.     . Calcium-Phosphorus-Vitamin D (CITRACAL +D3 PO) Take 1 tablet by mouth at bedtime.    . cetirizine (ZYRTEC) 10 MG tablet Take 10 mg by mouth at bedtime.    Marland Kitchen FLUoxetine (PROZAC) 40 MG capsule Take 40 mg by mouth at bedtime.     . hydrocortisone cream 1 % Apply 1 application topically daily as needed for itching.    Marland Kitchen ibuprofen (ADVIL,MOTRIN) 400 MG tablet Take 1 tablet (400 mg total) by mouth every 6 (six) hours. 30 tablet 0  . lisinopril (PRINIVIL,ZESTRIL) 10 MG tablet Take 10 mg by mouth daily.    . methocarbamol (ROBAXIN) 500 MG tablet Take 1 tablet (500 mg total) by mouth 3 (three) times daily. (Patient taking differently: Take 500 mg by mouth 2 (two) times daily. ) 20 tablet 0  . Multiple Vitamins-Minerals (MULTIVITAMIN WITH MINERALS) tablet Take 1 tablet by mouth  at bedtime.     . Omega-3 Fatty Acids (FISH OIL PO) Take 1 capsule by mouth daily.     . pantoprazole (PROTONIX) 40 MG tablet Take 40 mg by mouth daily.    . rosuvastatin (CRESTOR) 10 MG tablet Take 10 mg by mouth at bedtime. Reported on 09/18/2015  5   No current facility-administered medications for this visit.     Physical Exam BP 119/62   Pulse 88   Resp 20   Ht 5\' 5"  (1.651 m)   Wt 163 lb (73.9 kg)   SpO2 95% Comment: RA  BMI 27.5 kg/m  73 year old woman in no acute distress Alert and oriented x3 with no focal deficits Lungs clear with equal breath sounds bilaterally Tender to palpation right ninth and 10th ribs laterally Cardiac regular rate and rhythm normal S1 and S2  Diagnostic Tests: CT CHEST WITHOUT CONTRAST  TECHNIQUE: Multidetector CT imaging of the chest was performed following the standard  protocol without IV contrast.  COMPARISON:  CT chest dated 06/15/2017 and 12/16/2016  FINDINGS: Cardiovascular: The heart is normal in size. No pericardial effusion.  No evidence of thoracic aortic aneurysm. Atherosclerotic calcifications of the aortic arch.  Three vessel coronary atherosclerosis.  Mediastinum/Nodes: No suspicious mediastinal lymphadenopathy.  Stable 2.3 cm right thyroid nodule.  Lungs/Pleura: No suspicious pulmonary nodules.  No focal consolidation.  No pleural effusion or pneumothorax.  Upper Abdomen: Visualized upper abdomen is notable for prior cholecystectomy.  Musculoskeletal: Mild degenerative changes of the visualized thoracolumbar spine.  Old/healed right posterior 9th and 10th rib fractures.  IMPRESSION: No suspicious pulmonary nodules.  Stable 2.3 cm right thyroid nodule. Consider thyroid ultrasound as clinically warranted further evaluation.  No evidence of acute cardiopulmonary disease.  Aortic Atherosclerosis (ICD10-I70.0).   Electronically Signed   By: Julian Hy M.D.   On: 06/10/2018 08:48 I personally reviewed the CT images and concur with the findings noted above  Impression: Krista Austin is a 73 year old former smoker with a past history significant for type 2 diabetes, hyperlipidemia, chronic back pain, reflux, thyroid nodules, struct of sleep apnea and depression.  She was noted to have an abnormal CT about a year and a half ago with a 6 mm groundglass opacity in the right upper lobe.  There also was some pleural-parenchymal scarring on the left.  On follow-up the groundglass opacity in the right upper lobe resolved but she had residual places on the left lung that were worrisome.  I recommended a follow-up CT at a year.  Her scan today shows no suspicious lung nodules.  The lingular opacity has decreased in size.  It does appear to be scarring.  She does have a 50-pack-year history of tobacco abuse.  I  think she would benefit from being in the lung cancer screening program.  We will refer her to see Eric Form about that  Plan:  We will refer her to the lung cancer screening program  I will be happy to see her back anytime in the future if I can be of any further assistance with her care  Melrose Nakayama, MD [facility] [phone]

## 2018-07-29 ENCOUNTER — Telehealth (INDEPENDENT_AMBULATORY_CARE_PROVIDER_SITE_OTHER): Payer: Self-pay | Admitting: Radiology

## 2018-07-29 NOTE — Telephone Encounter (Signed)
Called and spoke with patient, patient answered NO to all pre screening questions.  

## 2018-08-01 ENCOUNTER — Ambulatory Visit (INDEPENDENT_AMBULATORY_CARE_PROVIDER_SITE_OTHER): Payer: Medicare Other | Admitting: Orthopedic Surgery

## 2018-08-01 ENCOUNTER — Ambulatory Visit (INDEPENDENT_AMBULATORY_CARE_PROVIDER_SITE_OTHER): Payer: Medicare Other

## 2018-08-01 ENCOUNTER — Encounter (INDEPENDENT_AMBULATORY_CARE_PROVIDER_SITE_OTHER): Payer: Self-pay | Admitting: Orthopedic Surgery

## 2018-08-01 ENCOUNTER — Other Ambulatory Visit: Payer: Self-pay

## 2018-08-01 DIAGNOSIS — M25532 Pain in left wrist: Secondary | ICD-10-CM | POA: Diagnosis not present

## 2018-08-01 NOTE — Progress Notes (Signed)
Office Visit Note   Patient: Krista Austin           Date of Birth: Aug 14, 1945           MRN: [id number] Visit Date: 08/01/2018 Requested by: Blair Heys, PA-C [address] [place], [place] [address] PCP: Blair Heys, PA-C  Subjective: Chief Complaint  Patient presents with  . Left Wrist - Pain    HPI: Krista Austin is a left-hand-dominant patient with left wrist pain.  She had a fall 07/25/2018.  Reporting pain at the base of the thumb.  Outside radiographs unremarkable.  New radiographs obtained today.  She has been wearing a thumb spica splint with some relief.              ROS: All systems reviewed are negative as they relate to the chief complaint within the history of present illness.  Patient denies  fevers or chills.   Assessment & Plan: Visit Diagnoses:  1. Pain in left wrist     Plan: Impression is left hand pain with base of the thumb CMC type pain with no acute fracture and functional thumb flexor and extensor tendon.  I think he would be good to wear that splint for 1 more week then if in 3 weeks she is still symptomatic call me up and we will get an MRI scan to look for any type of occult soft tissue or bony injury.  Follow-Up Instructions: Return if symptoms worsen or fail to improve.   Orders:  Orders Placed This Encounter  Procedures  . XR Wrist Complete Left   No orders of the defined types were placed in this encounter.     Procedures: No procedures performed   Clinical Data: No additional findings.  Objective: Vital Signs: There were no vitals taken for this visit.  Physical Exam:   Constitutional: Patient appears well-developed HEENT:  Head: Normocephalic Eyes:EOM are normal Neck: Normal range of motion Cardiovascular: Normal rate Pulmonary/chest: Effort normal Neurologic: Patient is alert Skin: Skin is warm Psychiatric: Patient has normal mood and affect    Ortho Exam: Ortho exam demonstrates full active and passive range of motion  of the left wrist.  She has good EPL FPL function of the left thumb.  A little bit of pain at the base of that first [facility] joint but negative grind test.  No tenderness over the snuffbox or radial styloid.  Finkelstein's test negative  Specialty Comments:  No specialty comments available.  Imaging: Xr Wrist Complete Left  Result Date: 08/01/2018 AP lateral oblique and scaphoid view left wrist reviewed.  Mild CMC arthritis is present.  No acute fracture is noted.  There is mild radiocarpal arthritis and ulnar negative variance.    PMFS History: Patient Active Problem List   Diagnosis Date Noted  . Closed fracture of 9th & 10th ribs of right side 12/17/2016  . Pneumothorax on right 12/17/2016  . Lumbar degenerative disc disease 12/17/2016  . Essential hypertension 12/17/2016  . Dyslipidemia 10/07/2016  . Pulmonary nodule 10/07/2016  . Abnormal CXR   . Chest pain 10/06/2016  . S/P lumbar spinal fusion 08/05/2016  . Obstructive sleep apnea syndrome, mild 10/27/2013  . Controlled type 2 diabetes mellitus without complication, without long-term current use of insulin ([place]) 06/19/2011  . GAD (generalized anxiety disorder) 11/01/2009   Past Medical History:  Diagnosis Date  . Adenomatous polyp   . Arthritis    "mostly fingers" (10/07/2016)  . At high risk for falls   .  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 Chronic back pain    "worse in my neck; lower back also affected" (10/07/2016)  . Chronic cholecystitis 10/16/2016  . Complication of anesthesia    "never fully regained my taste after my cervical fusion; I don't take much RX so I'm very sensitive to any sedation" (10/07/2016)  . Concussion    X2  . DDD (degenerative disc disease), cervical    lumbar  . Depression    "hx; take Prozac to keep me stable" (10/07/2016)  . Dizziness    caused by compression of cervical disc  . Family history of adverse reaction to anesthesia     "daughters get PONV; one daughter breaks out severely; grandson got suspended in a state of anxiousness after kidney surgery; had to be held for hours"  . GERD (gastroesophageal reflux disease)   . Hyperlipidemia   . OSA on CPAP   . Pneumonia    "once as an adult" (10/07/2016)  . Thyroid nodule   . Type II diabetes mellitus ([place])    controlled by diet and exercide    Family History  Problem Relation Age of Onset  . Mitral valve prolapse Mother   . Hyperlipidemia Mother   . Macular degeneration Mother   . Hypothyroidism Mother   . Arthritis Mother   . Atrial fibrillation Father   . COPD Father   . Osteoarthritis Sister   . Hypothyroidism Sister   . Hyperlipidemia Sister   . Dementia Maternal Grandmother   . Breast cancer Maternal Grandmother   . Hypertension Maternal Grandmother   . Arthritis Maternal Grandmother   . Diabetes type I Maternal Grandfather   . Arthritis Sister   . Hypothyroidism Sister   . Fibromyalgia Sister   . Other Other        [place]schlatter    Past Surgical History:  Procedure Laterality Date  . ANTERIOR CERVICAL DECOMP/DISCECTOMY FUSION  2015  . BACK SURGERY    . CARPAL TUNNEL RELEASE Bilateral   . CATARACT EXTRACTION, BILATERAL Bilateral 09/10/2014 - 09/24/2014   by Darleen Crocker  . CHOLECYSTECTOMY N/A 10/16/2016   Procedure: LAPAROSCOPIC CHOLECYSTECTOMY;  Surgeon: [name], MD;  Location: [facility];  Service: General;  Laterality: N/A;  . LAPAROSCOPIC CHOLECYSTECTOMY  10/16/2016  . [place] SURGERY  2007  . TONSILLECTOMY  05/05/1983  . TUBAL LIGATION  02/09/1979   Social History   Occupational History  . Not on file  Tobacco Use  . Smoking status: Former Smoker    Packs/day: 1.00    Years: 50.00    Pack years: 50.00    Types: Cigarettes    Last attempt to quit: 10/16/2016    Years since quitting: 1.7  . Smokeless tobacco: Never Used  . Tobacco comment: 10/16/2016 "weaned down the past 9 days; on RX now; done!!!!!!!!"  Substance and  Sexual Activity  . Alcohol use: No  . Drug use: No  . Sexual activity: Not Currently

## 2018-11-10 ENCOUNTER — Other Ambulatory Visit: Payer: Self-pay

## 2018-11-10 ENCOUNTER — Ambulatory Visit (INDEPENDENT_AMBULATORY_CARE_PROVIDER_SITE_OTHER): Payer: Medicare Other

## 2018-11-10 ENCOUNTER — Ambulatory Visit (INDEPENDENT_AMBULATORY_CARE_PROVIDER_SITE_OTHER): Payer: Medicare Other | Admitting: Orthopedic Surgery

## 2018-11-10 ENCOUNTER — Encounter: Payer: Self-pay | Admitting: Orthopedic Surgery

## 2018-11-10 ENCOUNTER — Ambulatory Visit: Payer: Self-pay

## 2018-11-10 DIAGNOSIS — M25551 Pain in right hip: Secondary | ICD-10-CM | POA: Diagnosis not present

## 2018-11-10 DIAGNOSIS — M25562 Pain in left knee: Secondary | ICD-10-CM | POA: Diagnosis not present

## 2018-11-10 NOTE — Progress Notes (Signed)
Office Visit Note   Patient: Krista Austin           Date of Birth: 1946/01/12           MRN: [id number] Visit Date: 11/10/2018 Requested by: Blair Heys, PA-C [address] [place],   [address] PCP: Blair Heys, PA-C  Subjective: Chief Complaint  Patient presents with  . Neck - Pain  . Lower Back - Pain  . Left Knee - Pain    HPI: Lakaya is a patient presents with multiple orthopedic problems today.  Patient describes neck pain.  Her old notes were reviewed.  She had previous fusion in 2015 by Dr. Saintclair Halsted.  This was a 1 level fusion.  Now she describes pretty significant pain over the past year in that left arm.  Physical therapy helped at once.  She has not had any injections because diabetes causes her blood sugar to increase and she also gets very agitated with cortisone injections.  Is been keeping her up now for about 2 months.  She describes left arm weakness subjectively.  She does have a prescription for nonsteroidals and muscle relaxers but she states she is sensitive to medication.  Patient also describes right "hip" pain which radiates down below the knee into the lateral calf.  She is followed by Dr. Saintclair Halsted for this also.  She reports no numbness and tingling but a burning sensation in her legs worse on the right-hand side.  She did have MRI scan in July 2019 which shows primary primarily left-sided foraminal stenosis.  This does keep her up at night on that right-hand side.  She does have some occasional groin pain on the right as well.  She does have a history of surgery for laminectomy in 2007 in [place].  Patient also describes left knee pain.  Reports primarily medial sided pain.  She had a fall and March.  Reports primarily medial sided knee pain with no real mechanical symptoms or effusion.  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              ROS: All systems reviewed are negative as they  relate to the chief complaint within the history of present illness.  Patient denies  fevers or chills.   Assessment & Plan: Visit Diagnoses:  1. Left knee pain, unspecified chronicity   2. Pain in right hip     Plan: Impression is left knee pain with no effusion normal radiographs stable collateral cruciate ligaments.  Discussed Toradol injection today but she wants to hold off on that intervention.  I think the knee is something that may or may not have some degenerative meniscal pathology but in general she wants to live with that.  Regarding the low back I think she does have some right-sided radiculopathy.  MRI scan from a year ago really does not explain right-sided symptoms.  She may also have some occult right hip arthritis based on her exam and slight limitation of internal rotation on the right-hand side compared to the left.  This is bothering her enough that she would consider intervention of a surgical nature.  It is keeping her up from sleep at night.  Plan MRI of lumbar spine to evaluate right-sided radiculopathy MRI pelvis to evaluate for occult arthritis in that right hip.  I will see her back after those studies.  Follow-Up Instructions: Return for after MRI.   Orders:  Orders Placed This Encounter  Procedures  . XR HIP UNILAT W OR W/O PELVIS 2-3 VIEWS RIGHT  . XR Knee 1-2 Views Left  . MR Lumbar Spine w/o contrast  . MR Pelvis w/o contrast   No orders of the defined types were placed in this encounter.     Procedures: No procedures performed   Clinical Data: No additional findings.  Objective: Vital Signs: There were no vitals taken for this visit.  Physical Exam:   Constitutional: Patient appears well-developed HEENT:  Head: Normocephalic Eyes:EOM are normal Neck: Normal range of motion Cardiovascular: Normal rate Pulmonary/chest: Effort normal Neurologic: Patient is alert Skin: Skin is warm Psychiatric: Patient has normal mood and affect     Ortho Exam: Ortho exam demonstrates pretty normal gait alignment with no Trendelenburg gait.  She does have some groin pain with internal X rotation of the right leg but not the left leg.  She has good ankle dorsiflexion plantarflexion quad hamstring strength and no real nerve root tension signs.  She does have burning which she describes down that right leg but no definite paresthesias L1 S1 bilaterally.  Reflexes symmetric 0 to 1+ out of 4 bilateral patella and Achilles.  She has good pretty reasonable cervical spine range of motion with symmetric 5 out of 5 grip EPL FPL interosseous wrist flexion extension bicep triceps and deltoid strength with palpable radial pulses.  No definite paresthesias C5-T1.  Reflexes symmetric 1+ out of 4 bilateral biceps and triceps.  Specialty Comments:  No specialty comments available.  Imaging: Xr Hip Unilat W Or W/o Pelvis 2-3 Views Right  Result Date: 11/10/2018 AP pelvis lateral right hip reviewed.  No significant joint space narrowing or spurring is noted in the hip joint.  Visualized bony pelvis otherwise normal.  Degenerative changes are noted in the lumbar spine.    PMFS History: Patient Active Problem List   Diagnosis Date Noted  . Closed fracture of 9th & 10th ribs of right side 12/17/2016  . Pneumothorax on right 12/17/2016  . Lumbar degenerative disc disease 12/17/2016  . Essential hypertension 12/17/2016  . Dyslipidemia 10/07/2016  . Pulmonary nodule 10/07/2016  . Abnormal CXR   . Chest pain 10/06/2016  . S/P lumbar spinal fusion 08/05/2016  . Obstructive sleep apnea syndrome, mild 10/27/2013  . Controlled type 2 diabetes mellitus without complication, without long-term current use of insulin ([place]) 06/19/2011  . GAD (generalized anxiety disorder) 11/01/2009   Past Medical History:  Diagnosis Date  . Adenomatous polyp   . Arthritis    "mostly fingers" (10/07/2016)  . At high risk for falls   . Celiac disease    "I do not have the disease;  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" (10/07/2016)  . Chronic back pain    "worse in my neck; lower back also affected" (10/07/2016)  . Chronic cholecystitis 10/16/2016  . Complication of anesthesia    "never fully regained my taste after my cervical fusion; I don't take much RX so I'm very sensitive to any sedation" (10/07/2016)  . Concussion    X2  . DDD (degenerative disc disease), cervical    lumbar  . Depression    "hx; take Prozac to keep me stable" (10/07/2016)  . Dizziness    caused by compression of cervical disc  . Family history of adverse reaction to anesthesia    "daughters get PONV; one daughter breaks out severely; grandson got suspended in a state of anxiousness after kidney surgery; had to be held for hours"  .  GERD (gastroesophageal reflux disease)   . Hyperlipidemia   . OSA on CPAP   . Pneumonia    "once as an adult" (10/07/2016)  . Thyroid nodule   . Type II diabetes mellitus ([place])    controlled by diet and exercide    Family History  Problem Relation Age of Onset  . Mitral valve prolapse Mother   . Hyperlipidemia Mother   . Macular degeneration Mother   . Hypothyroidism Mother   . Arthritis Mother   . Atrial fibrillation Father   . COPD Father   . Osteoarthritis Sister   . Hypothyroidism Sister   . Hyperlipidemia Sister   . Dementia Maternal Grandmother   . Breast cancer Maternal Grandmother   . Hypertension Maternal Grandmother   . Arthritis Maternal Grandmother   . Diabetes type I Maternal Grandfather   . Arthritis Sister   . Hypothyroidism Sister   . Fibromyalgia Sister   . Other Other        Osgood-schlatter    Past Surgical History:  Procedure Laterality Date  . ANTERIOR CERVICAL DECOMP/DISCECTOMY FUSION  2015  . BACK SURGERY    . CARPAL TUNNEL RELEASE Bilateral   . CATARACT EXTRACTION, BILATERAL Bilateral 09/10/2014 - 09/24/2014   by Darleen Crocker  . CHOLECYSTECTOMY N/A 10/16/2016   Procedure: LAPAROSCOPIC CHOLECYSTECTOMY;  Surgeon: [name], MD;   Location: [facility];  Service: General;  Laterality: N/A;  . LAPAROSCOPIC CHOLECYSTECTOMY  10/16/2016  . [place] SURGERY  2007  . TONSILLECTOMY  05/05/1983  . TUBAL LIGATION  02/09/1979   Social History   Occupational History  . Not on file  Tobacco Use  . Smoking status: Former Smoker    Packs/day: 1.00    Years: 50.00    Pack years: 50.00    Types: Cigarettes    Quit date: 10/16/2016    Years since quitting: 2.0  . Smokeless tobacco: Never Used  . Tobacco comment: 10/16/2016 "weaned down the past 9 days; on RX now; done!!!!!!!!"  Substance and Sexual Activity  . Alcohol use: No  . Drug use: No  . Sexual activity: Not Currently

## 2018-11-17 ENCOUNTER — Encounter: Payer: Self-pay | Admitting: Orthopedic Surgery

## 2018-12-14 ENCOUNTER — Ambulatory Visit
Admission: RE | Admit: 2018-12-14 | Discharge: 2018-12-14 | Disposition: A | Discharge status: Home / Self Care | Payer: Medicare Other | Source: Ambulatory Visit | Attending: Orthopedic Surgery | Admitting: Orthopedic Surgery

## 2018-12-14 DIAGNOSIS — M25551 Pain in right hip: Secondary | ICD-10-CM

## 2018-12-15 ENCOUNTER — Encounter: Payer: Self-pay | Admitting: Orthopedic Surgery

## 2018-12-15 NOTE — Telephone Encounter (Signed)
Can you please advise on MRI results of hip?

## 2018-12-15 NOTE — Telephone Encounter (Signed)
Hip underwhelming a little tendonitis minimal oa back is the problem

## 2018-12-15 NOTE — Progress Notes (Signed)
Please call patient with results.  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  I think that if she wants to get some relief she should go see Dr. Ernestina Patches and see if he can injector

## 2018-12-18 ENCOUNTER — Encounter: Payer: Self-pay | Admitting: Orthopedic Surgery

## 2018-12-19 ENCOUNTER — Ambulatory Visit: Payer: Medicare Other | Admitting: Orthopedic Surgery

## 2018-12-19 ENCOUNTER — Other Ambulatory Visit: Payer: Self-pay

## 2018-12-19 MED ORDER — GABAPENTIN 100 MG PO CAPS: 100.0000 mg | ORAL_CAPSULE | Freq: Two times a day (BID) | ORAL | 0 refills | Status: DC

## 2018-12-19 NOTE — Telephone Encounter (Signed)
Ok for neurontin 100 po bid for 3 weeks # 50 pld call  hx

## 2019-01-24 ENCOUNTER — Other Ambulatory Visit: Payer: Self-pay

## 2019-01-24 DIAGNOSIS — Z20822 Contact with and (suspected) exposure to covid-19: Secondary | ICD-10-CM

## 2019-01-25 LAB — NOVEL CORONAVIRUS, NAA: SARS-CoV-2, NAA: NOT DETECTED

## 2019-02-05 ENCOUNTER — Encounter: Payer: Self-pay | Admitting: Orthopedic Surgery

## 2019-02-06 MED ORDER — GABAPENTIN 100 MG PO CAPS: 100.0000 mg | ORAL_CAPSULE | Freq: Two times a day (BID) | ORAL | 0 refills | Status: DC

## 2019-02-06 NOTE — Telephone Encounter (Signed)
Okay to prescribe 1 more time and then that should go to the primary care provider.

## 2019-03-27 ENCOUNTER — Other Ambulatory Visit: Payer: Self-pay | Admitting: Physician Assistant

## 2019-03-27 DIAGNOSIS — Z1231 Encounter for screening mammogram for malignant neoplasm of breast: Secondary | ICD-10-CM

## 2019-04-03 ENCOUNTER — Ambulatory Visit
Admission: RE | Admit: 2019-04-03 | Discharge: 2019-04-03 | Disposition: A | Discharge status: Home / Self Care | Payer: Medicare Other | Source: Ambulatory Visit | Attending: Physician Assistant | Admitting: Physician Assistant

## 2019-04-03 ENCOUNTER — Other Ambulatory Visit: Payer: Self-pay

## 2019-04-03 DIAGNOSIS — Z1231 Encounter for screening mammogram for malignant neoplasm of breast: Secondary | ICD-10-CM

## 2019-05-29 ENCOUNTER — Other Ambulatory Visit: Payer: Self-pay | Admitting: *Deleted

## 2019-05-29 DIAGNOSIS — Z87891 Personal history of nicotine dependence: Secondary | ICD-10-CM

## 2019-06-15 ENCOUNTER — Ambulatory Visit: Payer: Medicare Other | Attending: Internal Medicine

## 2019-06-15 DIAGNOSIS — Z23 Encounter for immunization: Secondary | ICD-10-CM

## 2019-06-15 NOTE — Progress Notes (Signed)
   Covid-19 Vaccination Clinic  Name:  Krista Austin    MRN: [id number] DOB: 21-May-1945  06/15/2019  [name] was observed post Covid-19 immunization for 15 minutes without incidence. She was provided with Vaccine Information Sheet and instruction to access the V-Safe system.   [name] was instructed to call 911 with any severe reactions post vaccine: Marland Kitchen Difficulty breathing  . Swelling of your face and throat  . A fast heartbeat  . A bad rash all over your body  . Dizziness and weakness    Immunizations Administered    Name Date Dose VIS Date Route   Pfizer COVID-19 Vaccine 06/15/2019  9:42 AM 0.3 mL 04/14/2019 Intramuscular   Manufacturer: [place]   Lot: [id number]   [place]: [id number]

## 2019-06-16 ENCOUNTER — Ambulatory Visit (INDEPENDENT_AMBULATORY_CARE_PROVIDER_SITE_OTHER): Payer: Medicare Other | Admitting: Primary Care

## 2019-06-16 ENCOUNTER — Ambulatory Visit
Admission: RE | Admit: 2019-06-16 | Discharge: 2019-06-16 | Disposition: A | Discharge status: Home / Self Care | Payer: Medicare Other | Source: Ambulatory Visit | Attending: Acute Care | Admitting: Acute Care

## 2019-06-16 ENCOUNTER — Other Ambulatory Visit: Payer: Self-pay

## 2019-06-16 ENCOUNTER — Encounter: Payer: Self-pay | Admitting: Primary Care

## 2019-06-16 VITALS — BP 94/58 | HR 89 | Temp 97.0°F | Ht 65.0 in | Wt 166.0 lb

## 2019-06-16 DIAGNOSIS — Z87891 Personal history of nicotine dependence: Secondary | ICD-10-CM | POA: Diagnosis not present

## 2019-06-16 DIAGNOSIS — Z122 Encounter for screening for malignant neoplasm of respiratory organs: Secondary | ICD-10-CM | POA: Diagnosis not present

## 2019-06-16 NOTE — Patient Instructions (Signed)

## 2019-06-16 NOTE — Progress Notes (Signed)
Shared Decision Making Visit Lung Cancer Screening Program [id number])   Eligibility:  Age 74 y.o.  Pack Years Smoking History Calculation 50 (# packs/per year x # years smoked)  Recent History of coughing up blood  no  Unexplained weight loss? no ( >Than 15 pounds within the last 6 months )  Prior History Lung / other cancer no (Diagnosis within the last 5 years already requiring surveillance chest CT Scans).  Smoking Status Former Smoker  Former Smokers: Years since quit: 3 years  Quit Date: 2018  Visit Components:  Discussion included one or more decision making aids. yes  Discussion included risk/benefits of screening. yes  Discussion included potential follow up diagnostic testing for abnormal scans. yes  Discussion included meaning and risk of over diagnosis. yes  Discussion included meaning and risk of False Positives. yes  Discussion included meaning of total radiation exposure. yes  Counseling Included:  Importance of adherence to annual lung cancer LDCT screening. yes  Impact of comorbidities on ability to participate in the program. yes  Ability and willingness to under diagnostic treatment. yes  Smoking Cessation Counseling:  Current Smokers:   Discussed importance of smoking cessation. yes  Information about tobacco cessation classes and interventions provided to patient. yes  Patient provided with "ticket" for LDCT Scan. yes  Symptomatic Patient. no  Diagnosis Code: Tobacco Use Z72.0  Asymptomatic Patient yes  Counseling (Intermediate counseling: > three minutes counseling) [id number]  Former Smokers:   Discussed the importance of maintaining cigarette abstinence. yes  Diagnosis Code: Personal History of Nicotine Dependence. [id number]  Information about tobacco cessation classes and interventions provided to patient. Yes  Patient provided with "ticket" for LDCT Scan. yes  Written Order for Lung Cancer Screening with LDCT placed in Epic.  Yes (CT Chest Lung Cancer Screening Low Dose W/O CM) [id number] Z12.2-Screening of respiratory organs Z87.891-Personal history of nicotine dependence  BP (!) 94/58 (BP Location: Right Arm, Cuff Size: Normal)   Pulse 89   Temp (!) 97 F (36.1 C) (Temporal)   Ht 5\' 5"  (1.651 m)   Wt 166 lb (75.3 kg)   SpO2 99% Comment: RA  BMI 27.62 kg/m    I have spent 25 minutes of face to face time with her discussing the risks and benefits of lung cancer screening. We viewed a power point together that explained in detail the above noted topics. We paused at intervals to allow for questions to be asked and answered to ensure understanding.We discussed that the single most powerful action that she can take to decrease her risk of developing lung cancer is to continued smoking abstinence. I have also given her Magnus Sinning card and contact information in the event she needs to contact her. We discussed the time and location of the scan, and that either Doroteo Glassman RN or Judson Roch will call with the results within 24-48 hours of receiving them. I have offered her  a copy of the power point we viewed  as a resource in the event they need reinforcement of the concepts we discussed today in the office. The patient verbalized understanding of all of  the above and had no further questions upon leaving the office. They have my contact information in the event they have any further questions.  I spent 10mins minutes counseling on importance of continued smoking abstinence  I explained to the patient that there has been a high incidence of coronary artery disease noted on these exams. I explained that this is a non-gated exam  therefore degree or severity cannot be determined. This patient is on statin therapy. I have asked the patient to follow-up with their PCP regarding any incidental finding of coronary artery disease and management with diet or medication as their PCP  feels is clinically indicated. The patient verbalized  understanding of the above and had no further questions upon completion of the visit.  She has hx of sleep apnea and thyroid nodules which are being followed. She has known cervical/ lumbar disc disease   Martyn Ehrich, NP

## 2019-06-19 ENCOUNTER — Telehealth: Payer: Self-pay | Admitting: Acute Care

## 2019-06-19 NOTE — Telephone Encounter (Signed)
Call report on 2/12 lung cancer screening CT.  Impression copied below, full report in Epic.  SG please advise, thanks!   1. Lung-RADS 2S, benign appearance or behavior. Continue annual screening with low-dose chest CT without contrast in 12 months. 2. The "S" modifier above refers to potentially clinically significant non lung cancer related findings. Specifically, there is a new poorly defined central liver lesion estimated to measure approximately 3.6 x 2.2 cm. This is of uncertain etiology and significance, but further evaluation with nonemergent abdominal MRI with and without IV gadolinium is strongly recommended in the near future to evaluate for potential malignancy. 3. Aortic atherosclerosis, in addition to 3 vessel coronary artery disease. Assessment for potential risk factor modification, dietary therapy or pharmacologic therapy may be warranted, if clinically indicated. 4. Mild diffuse bronchial wall thickening with very mild centrilobular and paraseptal emphysema; imaging findings suggestive of underlying COPD.

## 2019-06-21 ENCOUNTER — Other Ambulatory Visit: Payer: Self-pay | Admitting: *Deleted

## 2019-06-21 DIAGNOSIS — Z87891 Personal history of nicotine dependence: Secondary | ICD-10-CM

## 2019-06-21 NOTE — Telephone Encounter (Signed)
Patient is aware per result note: Krista Beckers, RN  06/21/2019 10:41 AM EST  Order placed for 1 year f/u Low dose ct.   [name], NP  06/21/2019 9:58 AM EST  These results have been called to the patient. Lung RADS 2: nodules that are benign in appearance and behavior with a very low likelihood of becoming a clinically active cancer due to size or lack of growth. Recommendation per radiology is for a repeat LDCT in 12 months.  The notation of a new liver lesion has had immediate follow up. Pt. Was seen by her PCP 2/16, and she has follow up with oncology ( Dr. Georgiann Cocker at [facility]) today 2/17. She verbalized understanding of the results. Langley Gauss, PCP already has the results, please schedule for 06/2020 annual screening scan. We will need to confirm this new liver lesion is not malignant before screeing her next year. Thanks    Will sign off.

## 2019-06-21 NOTE — Progress Notes (Signed)
These results have been called to the patient. Lung RADS 2: nodules that are benign in appearance and behavior with a very low likelihood of becoming a clinically active cancer due to size or lack of growth. Recommendation per radiology is for a repeat LDCT in 12 months.  The notation of a new liver lesion has had immediate follow up. Pt. Was seen by her PCP 2/16, and she has follow up with oncology ( Dr. Georgiann Cocker at [facility]) today 2/17. She verbalized understanding of the results. Langley Gauss, PCP already has the results, please schedule for 06/2020 annual screening scan. We will need to confirm this new liver lesion is not malignant before screeing her next year. Thanks

## 2019-07-08 ENCOUNTER — Ambulatory Visit: Payer: Medicare Other | Attending: Internal Medicine

## 2019-07-08 DIAGNOSIS — Z23 Encounter for immunization: Secondary | ICD-10-CM

## 2019-07-08 NOTE — Progress Notes (Signed)
   Covid-19 Vaccination Clinic  Name:  ALENY SZAFRAN    MRN: [id number] DOB: 06/20/1945  07/08/2019  Ms. Gunion was observed post Covid-19 immunization for 15 minutes. After 15 minutes she felt dizzy. Patient has a history of vertigo. She became dizzy after her first vaccine. BP=114/66, HR=90, O2 sat=100%. She was given water and a fan. 106/71, HR=82, O2 sat=100%. Patient was observed an additional 15 minutes and "felt much better" afterwards. Patient was accompanied by her daughter. She was provided with Vaccine Information Sheet and instruction to access the V-Safe system.   Ms. Thang was instructed to call 911 with any severe reactions post vaccine: Marland Kitchen Difficulty breathing  . Swelling of face and throat  . A fast heartbeat  . A bad rash all over body  . Dizziness and weakness   Immunizations Administered    Name Date Dose VIS Date Route   Pfizer COVID-19 Vaccine 07/08/2019 10:17 AM 0.3 mL 04/14/2019 Intramuscular   Manufacturer: [place]   Lot: [id number]   [place]: [id number]

## 2019-07-16 ENCOUNTER — Encounter: Payer: Self-pay | Admitting: Orthopedic Surgery

## 2020-05-02 ENCOUNTER — Ambulatory Visit: Payer: Medicare Other | Attending: Neurosurgery | Admitting: Physical Therapy

## 2020-05-02 ENCOUNTER — Other Ambulatory Visit: Payer: Self-pay

## 2020-05-02 DIAGNOSIS — M545 Low back pain, unspecified: Secondary | ICD-10-CM | POA: Insufficient documentation

## 2020-05-02 DIAGNOSIS — G8929 Other chronic pain: Secondary | ICD-10-CM | POA: Insufficient documentation

## 2020-05-02 DIAGNOSIS — M6281 Muscle weakness (generalized): Secondary | ICD-10-CM

## 2020-05-03 ENCOUNTER — Encounter: Payer: Self-pay | Admitting: Physical Therapy

## 2020-05-03 NOTE — Therapy (Addendum)
[facility] [address], [place], [place], [address] Phone: [phone]   Fax:  [phone]  Physical Therapy Evaluation  Patient Details  Name: Krista Austin MRN: [id number] Date of Birth: [birth year] Referring Provider (PT): Kary Kos, MD   Encounter Date: 05/02/2020   PT End of Session - 05/03/20 1924    Visit Number 1    Date for PT Re-Evaluation 07/25/20    Authorization Type medicare A/B    Progress Note Due on Visit 10    PT Start Time 1616    PT Stop Time 1657    PT Time Calculation (min) 41 min    Activity Tolerance Patient tolerated treatment well    Behavior During Therapy [facility] for tasks assessed/performed           Past Medical History:  Diagnosis Date  . Adenomatous polyp   . Arthritis    "mostly fingers" (10/07/2016)  . At high risk for falls   . 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 Chronic back pain    "worse in my neck; lower back also affected" (10/07/2016)  . Chronic cholecystitis 10/16/2016  . Complication of anesthesia    "never fully regained my taste after my cervical fusion; I don't take much RX so I'm very sensitive to any sedation" (10/07/2016)  . Concussion    X2  . DDD (degenerative disc disease), cervical    lumbar  . Depression    "hx; take Prozac to keep me stable" (10/07/2016)  . Dizziness    caused by compression of cervical disc  . Family history of adverse reaction to anesthesia    "daughters get PONV; one daughter breaks out severely; grandson got suspended in a state of anxiousness after kidney surgery; had to be held for hours"  . GERD (gastroesophageal reflux disease)   . Hyperlipidemia   . OSA on CPAP   . Pneumonia    "once as an adult" (10/07/2016)  . Thyroid nodule   . Type II diabetes mellitus ([place])    controlled by diet and exercide    Past Surgical History:  Procedure Laterality Date  . ANTERIOR CERVICAL  DECOMP/DISCECTOMY FUSION  2015  . BACK SURGERY    . CARPAL TUNNEL RELEASE Bilateral   . CATARACT EXTRACTION, BILATERAL Bilateral 09/10/2014 - 09/24/2014   by Darleen Crocker  . CHOLECYSTECTOMY N/A 10/16/2016   Procedure: LAPAROSCOPIC CHOLECYSTECTOMY;  Surgeon: [name], MD;  Location: [facility];  Service: General;  Laterality: N/A;  . LAPAROSCOPIC CHOLECYSTECTOMY  10/16/2016  . [place] SURGERY  2007  . TONSILLECTOMY  05/05/1983  . TUBAL LIGATION  02/09/1979    There were no vitals filed for this visit.    Subjective Assessment - 05/02/20 1619    Subjective Pt taking gabapentin and flexoril now and working up dose and states this has helped a lot.  Pt had cervical fusion and low back pain for many years.  Pt has had flare up 8 weeks ago.  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  Pt reports numbness in Rt heel and side of the foot; and pain is in buttock and into the Rt calf. Imaging shows more issues on the Left but her pain is more on the Rt side.   Pertinent History DM, stage III kidney;    Limitations Sitting;Standing    How long can you sit comfortably? 1-2 hours  How long can you stand comfortably? 15-20 min    How long can you walk comfortably? 1/2 mile    Diagnostic tests MRI shows disc bulging    Patient Stated Goals be able walk 1 mile; do 2-3 hours of housework;do not want to come to clinic a lot because of high risk with Covid   Currently in Pain? Yes    Pain Score 2    5/10 on the upper end   Pain Location Buttocks    Pain Orientation Right    Pain Descriptors / Indicators Tingling;Numbness;Burning;Pins and needles    Pain Type Chronic pain    Pain Radiating Towards down Rt leg into the heel; Rt buttock is the burning and pins    Pain Onset More than a month ago    Pain Frequency Intermittent    Aggravating Factors  increases throughout the day; stairs; stooping and bending    Pain Relieving Factors rotating positions, current medication    Effect of Pain  on Daily Activities caring for her 32 y/o son with down syndrome              [facility] PT Assessment - 05/07/20 0001      Assessment   Medical Diagnosis M51.26 (ICD-10-CM) - Other intervertebral disc displacement, lumbar region    Referring Provider (PT) Kary Kos, MD    Onset Date/Surgical Date --   8 weeks flare up   Prior Therapy Not for back      Precautions   Precautions None      Balance Screen   Has the patient fallen in the past 6 months No      [place] residence    Living Arrangements Children   daughter and son     Prior Function   Level of Independence Independent    Vocation --   full time caregiver   Vocation Requirements has to reconfigure living area for her son; 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      Cognition   Overall Cognitive Status Within Functional Limits for tasks assessed      Observation/Other Assessments   Focus on Therapeutic Outcomes (FOTO)  48/100 functional      Posture/Postural Control   Posture/Postural Control Postural limitations    Postural Limitations Flexed trunk;Anterior pelvic tilt;Weight shift left      ROM / Strength   AROM / PROM / Strength AROM;PROM;Strength      AROM   Overall AROM Comments lumbar ext and sidebend caused pain and 50% limited extension      PROM   Overall PROM Comments hip ER/IR 50%      Strength   Strength Assessment Site Hip;Knee;Ankle    Right/Left Hip Right;Left    Right Hip Flexion 4/5    Right Hip Extension 4/5    Right Hip External Rotation  4+/5    Right Hip Internal Rotation 4+/5    Right Hip ABduction 4+/5    Right Hip ADduction 5/5    Left Hip Flexion 5/5    Left Hip Extension 5/5    Left Hip External Rotation 5/5    Left Hip Internal Rotation 5/5    Left Hip ABduction 5/5    Left Hip ADduction 5/5    Right/Left Knee Right;Left    Right Knee Flexion 4+/5    Right Knee Extension 5/5    Left Knee Flexion 5/5    Left Knee Extension 5/5    Right/Left  Ankle Right;Left  Right Ankle Dorsiflexion 4+/5    Left Ankle Dorsiflexion 4/5      Flexibility   Soft Tissue Assessment /Muscle Length yes    Hamstrings 70%      Palpation   SI assessment  normal    Palpation comment gluteals, hamstrings tight      Special Tests    Special Tests Lumbar    Lumbar Tests Straight Leg Raise      Straight Leg Raise   Findings Positive    Side  Right    Comment pain increase about 65 deg; increased with chin to chest after brought out of pain      Ambulation/Gait   Gait Pattern Decreased stride length   decreased hip ext; trunk flexed                     Objective measurements completed on examination: See above findings.       [facility] Adult PT Treatment/Exercise - 05/07/20 0001      Self-Care   Self-Care Other Self-Care Comments    Other Self-Care Comments  initial HEP as seen in chart                  PT Education - 05/05/20 1037    Education Details Access Code: [email or web address]    Person(s) Educated Patient    Methods Explanation;Demonstration;Tactile cues;Verbal cues;Handout    Comprehension Verbalized understanding;Returned demonstration            PT Short Term Goals - 05/03/20 1932      PT SHORT TERM GOAL #1   Title Pt will be independent with her initial HEP to decrease pain and improve ability to be on her feet.    Time 4    Period Weeks    Status New    Target Date 05/30/20      PT SHORT TERM GOAL #2   Title Pt will report atleast 30% decrease in her back and leg pain/numbness.    Time 4    Period Weeks    Status New    Target Date 05/30/20             PT Long Term Goals - 05/05/20 1046      PT LONG TERM GOAL #1   Title Pt will demonstrate PSLR to 70 degrees on RLE without increased pain due to improved neural and muscular flexibility for pain management    Baseline 45 deg provokes pain    Time 12    Period Weeks    Status New    Target Date 07/25/20      PT LONG TERM GOAL #2   Title  Pt will report atleast 70% improvement in low back and buttock pain with daily activity.    Baseline .Marland Kitchen    Time 12    Period Weeks    Status New      PT LONG TERM GOAL #3   Title reduce Rt LE numbness and tingling by 50% with household tasks and care of son    Time 12    Period Weeks    Status New      PT LONG TERM GOAL #4   Title Pt will be independent with her advanced HEP and verbalize understanding of exercise progressions which will allow her to maintain progress after discharge from PT.     Time 12    Period Weeks    Status New    Target Date 07/25/20  PT LONG TERM GOAL #5   Title Pt will report being able to walk 1 mile daily without increased pain or radicular symptoms due to improved strength    Time 12    Period Weeks    Status New    Target Date 07/25/20                  Plan - 05/05/20 1111    Clinical Impression Statement Pt is friendly 74 y/o female familiar to this clinic.  Pt has recently had flare up of low back pain that began around the holidays from doing a lot of house work as well as moving things in home to care for her son.  Pt has imaging showing disc degeneration and stenosis throughout lumbar, mild/mod retrolisthesis L1-2,L2-3, L3-4 and Lt foramen encroachment L5-S1.  Pt has most of her symptoms on the Rt side including numbness and tingling down leg to the Rt foot at times. Pt has decreased lumbar ROM with increased symptoms provoked with SB bilaterally and lumbar extension.  No increased pain with fwd flexion but limited ROM due to muscle tension.  Pt has LE weakness hip abdcution and gltues Rt>Lt and decreased ability to stabilize with her core doing ASLR.  Pt has PSLR positive for neural tension on the Rt side.  Pt will benefit from skilled PT to address these and all above mentioned impairments and focus on HEP for her ability to maintain and improve strength and health for maximum funciton and ability to care for her family.    Personal  Factors and Comorbidities Age;Comorbidity 2;Time since onset of injury/illness/exacerbation    Comorbidities DM; CKD    Examination-Activity Limitations Caring for Others;Carry;Lift;Bend;Squat    Examination-Participation Restrictions Cleaning;Community Activity;Meal Prep   cooking for family gatherings   Stability/Clinical Decision Making Evolving/Moderate complexity    Clinical Decision Making Low    Rehab Potential Excellent    PT Frequency Biweekly   wants to only come several times to avoid risk of infections   PT Duration 12 weeks    PT Treatment/Interventions ADLs/Self Care Home Management;Biofeedback;Cryotherapy;Electrical Stimulation;Moist Heat;Traction;Therapeutic activities;Therapeutic exercise;Neuromuscular re-education;Patient/family education;Manual techniques;Passive range of motion;Dry needling;Taping    PT Next Visit Plan f/u on initial HEP; possibly dry needling to lumbar; core and hip strength progression    PT Home Exercise Plan Access Code: [email or web address]    Consulted and Agree with Plan of Care Patient           Patient will benefit from skilled therapeutic intervention in order to improve the following deficits and impairments:  Abnormal gait,Pain,Increased fascial restricitons,Decreased strength,Decreased activity tolerance,Decreased range of motion,Increased muscle spasms,Difficulty walking,Decreased endurance  Visit Diagnosis: Chronic bilateral low back pain, unspecified whether sciatica present  Muscle weakness (generalized)     Problem List Patient Active Problem List   Diagnosis Date Noted  . Closed fracture of 9th & 10th ribs of right side 12/17/2016  . Pneumothorax on right 12/17/2016  . Lumbar degenerative disc disease 12/17/2016  . Essential hypertension 12/17/2016  . Dyslipidemia 10/07/2016  . Pulmonary nodule 10/07/2016  . Abnormal CXR   . Chest pain 10/06/2016  . S/P lumbar spinal fusion 08/05/2016  . Obstructive sleep apnea syndrome, mild  10/27/2013  . Controlled type 2 diabetes mellitus without complication, without long-term current use of insulin ([place]) 06/19/2011  . GAD (generalized anxiety disorder) 11/01/2009    Jule Ser, PT 05/07/2020, 9:29 AM  [facility] [address], [place], [place],  [address] Phone: [phone]   Fax:  [phone]  Name: Krista Austin MRN: [id number] Date of Birth: February 18, 1946

## 2020-05-05 NOTE — Patient Instructions (Signed)
Access Code: [email or web address] URL: [email or web address] Date: 05/05/2020 Prepared by: Dwana Curd  Exercises Supine Double Knee to Chest - 1 x daily - 7 x weekly - 1 sets - 3 reps - 20-30 hold Supine Hip Internal and External Rotation - 1 x daily - 7 x weekly - 1 sets - 10 reps - 5 sec hold Hooklying Hamstring Stretch with Strap - 1 x daily - 7 x weekly - 1 sets - 3 reps - 30 sec hold Supine ITB Stretch with Strap - 1 x daily - 7 x weekly - 1 sets - 3 reps - 30 sec hold Supine Sciatic Nerve Glide - 1 x daily - 7 x weekly - 1 sets - 10 reps Hooklying Small March - 1 x daily - 7 x weekly - 3 sets - 10 reps Hooklying Clamshell with Resistance - 1 x daily - 7 x weekly - 3 sets - 10 reps Clamshell with Resistance - 1 x daily - 7 x weekly - 3 sets - 10 reps

## 2020-05-07 NOTE — Addendum Note (Signed)
Addended by: Beatris Si on: 05/07/2020 09:31 AM   Modules accepted: Orders

## 2020-05-22 ENCOUNTER — Telehealth: Payer: Self-pay | Admitting: Acute Care

## 2020-05-22 ENCOUNTER — Other Ambulatory Visit: Payer: Self-pay | Admitting: Physician Assistant

## 2020-05-22 DIAGNOSIS — Z1231 Encounter for screening mammogram for malignant neoplasm of breast: Secondary | ICD-10-CM

## 2020-05-22 NOTE — Telephone Encounter (Signed)
Krista Austin, can you contact pt to schedule f/u lung screening CT?

## 2020-05-23 ENCOUNTER — Encounter: Payer: Medicare Other | Admitting: Physical Therapy

## 2020-05-23 NOTE — Telephone Encounter (Signed)
She is not due until 06/2020 but this is the note on the referral  Will need to make sure pt had negative liver biopsy from 06/2019 Has this be done?

## 2020-05-23 NOTE — Telephone Encounter (Signed)
Per her [facility] oncology note from 06/28/19 the liver exam was benign and no further f/u is needed for that.  Ok to go ahead and schedule low dose chest CT

## 2020-05-23 NOTE — Telephone Encounter (Signed)
I have left a message for Krista Austin to call and schedule her LCS CT [phone]

## 2020-05-24 NOTE — Telephone Encounter (Signed)
I have spoke with Krista Austin and her LCS CT has been scheduled on 06/17/2020 @ 10:30am and she is aware of the appt and location

## 2020-06-06 ENCOUNTER — Encounter: Payer: Medicare Other | Admitting: Physical Therapy

## 2020-06-11 ENCOUNTER — Telehealth: Payer: Self-pay | Admitting: Acute Care

## 2020-06-11 NOTE — Telephone Encounter (Signed)
Krista Austin will the pt still need the yearly CT scan?

## 2020-06-11 NOTE — Telephone Encounter (Signed)
Pt is scheduled for CT scan. Pt states CT from last year showed a lesion on her liver. She reached out to oncologist and had an MRI that showed fatty deposit on liver. Pt also states she had an MRI vertebrae and it show degenerating disk and mild bone marrow- heterogeneity - scatter T1/T2 hyperintense foli hemangiomata  Pt states she shared this information to avoid additional unnecessary testing.

## 2020-06-12 NOTE — Telephone Encounter (Signed)
Spoke with pt regarding upcoming lung screening CT. Pt wanted to know if they would be able to visualize the liver issue from last year. I advised pt that the radiologist will be able to notate any changes is they compare it to last years scan. Pt verbalized understanding. Nothing further needed at this time.

## 2020-06-17 ENCOUNTER — Other Ambulatory Visit: Payer: Self-pay

## 2020-06-17 ENCOUNTER — Ambulatory Visit
Admission: RE | Admit: 2020-06-17 | Discharge: 2020-06-17 | Disposition: A | Discharge status: Home / Self Care | Payer: Medicare Other | Source: Ambulatory Visit | Attending: Acute Care | Admitting: Acute Care

## 2020-06-17 DIAGNOSIS — Z87891 Personal history of nicotine dependence: Secondary | ICD-10-CM

## 2020-06-20 ENCOUNTER — Other Ambulatory Visit: Payer: Self-pay

## 2020-06-20 ENCOUNTER — Encounter: Payer: Self-pay | Admitting: Physical Therapy

## 2020-06-20 ENCOUNTER — Ambulatory Visit: Payer: Medicare Other | Attending: Neurosurgery | Admitting: Physical Therapy

## 2020-06-20 DIAGNOSIS — M6281 Muscle weakness (generalized): Secondary | ICD-10-CM | POA: Diagnosis present

## 2020-06-20 DIAGNOSIS — M545 Low back pain, unspecified: Secondary | ICD-10-CM | POA: Insufficient documentation

## 2020-06-20 DIAGNOSIS — G8929 Other chronic pain: Secondary | ICD-10-CM | POA: Diagnosis present

## 2020-06-20 NOTE — Therapy (Signed)
[facility] [address] [place], [place], [address] Phone: [phone]   Fax:  [phone]  Physical Therapy Treatment  Patient Details  Name: NIKA YAZZIE MRN: [id number] Date of Birth: Mar 07, 1946 Referring Provider (PT): Kary Kos, MD   Encounter Date: 06/20/2020   PT End of Session - 06/20/20 1229    Visit Number 2    Date for PT Re-Evaluation 07/25/20    Authorization Type medicare A/B    Progress Note Due on Visit 10    PT Start Time 0315    PT Stop Time 1227    PT Time Calculation (min) 42 min    Activity Tolerance Patient tolerated treatment well    Behavior During Therapy [facility] for tasks assessed/performed           Past Medical History:  Diagnosis Date  . Adenomatous polyp   . Arthritis    "mostly fingers" (10/07/2016)  . At high risk for falls   . 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 Chronic back pain    "worse in my neck; lower back also affected" (10/07/2016)  . Chronic cholecystitis 10/16/2016  . Complication of anesthesia    "never fully regained my taste after my cervical fusion; I don't take much RX so I'm very sensitive to any sedation" (10/07/2016)  . Concussion    X2  . DDD (degenerative disc disease), cervical    lumbar  . Depression    "hx; take Prozac to keep me stable" (10/07/2016)  . Dizziness    caused by compression of cervical disc  . Family history of adverse reaction to anesthesia    "daughters get PONV; one daughter breaks out severely; grandson got suspended in a state of anxiousness after kidney surgery; had to be held for hours"  . GERD (gastroesophageal reflux disease)   . Hyperlipidemia   . OSA on CPAP   . Pneumonia    "once as an adult" (10/07/2016)  . Thyroid nodule   . Type II diabetes mellitus ([place])    controlled by diet and exercide    Past Surgical History:  Procedure Laterality Date  . ANTERIOR CERVICAL  DECOMP/DISCECTOMY FUSION  2015  . BACK SURGERY    . CARPAL TUNNEL RELEASE Bilateral   . CATARACT EXTRACTION, BILATERAL Bilateral 09/10/2014 - 09/24/2014   by Darleen Crocker  . CHOLECYSTECTOMY N/A 10/16/2016   Procedure: LAPAROSCOPIC CHOLECYSTECTOMY;  Surgeon: [name], MD;  Location: [facility];  Service: General;  Laterality: N/A;  . LAPAROSCOPIC CHOLECYSTECTOMY  10/16/2016  . [place] SURGERY  2007  . TONSILLECTOMY  05/05/1983  . TUBAL LIGATION  02/09/1979    There were no vitals filed for this visit.   Subjective Assessment - 06/20/20 1148    Subjective Pt states she has worked back up to her 1 mile walking and has been doing the exercises every day.  Sitting to drive    Pertinent History DM, stage III kidney;    Patient Stated Goals be able walk 1 mile; do 2-3 hours of housework;do not want to come to clinic a lot because of high risk with Covid    Currently in Pain? Yes    Pain Score 1    goes up to 3-4   Pain Location Buttocks    Pain Orientation Right    Pain Descriptors / Indicators Aching    Pain Type Chronic pain    Pain Radiating  Towards Rt buttock and is not going in the leg    Pain Onset More than a month ago    Pain Frequency Intermittent    Aggravating Factors  sitting and driving    Multiple Pain Sites No                             [facility] Adult PT Treatment/Exercise - 06/20/20 0001      Exercises   Exercises Lumbar      Lumbar Exercises: Stretches   Figure 4 Stretch 2 reps      Lumbar Exercises: Standing   Other Standing Lumbar Exercises hip abduction 10      Lumbar Exercises: Supine   Bridge 10 reps      Lumbar Exercises: Sidelying   Clam 10 reps      Manual Therapy   Manual Therapy Soft tissue mobilization    Soft tissue mobilization gluteals and piriformis            Trigger Point Dry Needling - 06/20/20 0001    Consent Given? Yes    Education Handout Provided Previously provided   has had here in the past   Muscles  Treated Back/Hip Gluteus minimus;Gluteus medius;Gluteus maximus;Piriformis    Gluteus Minimus Response Palpable increased muscle length;Twitch response elicited    Gluteus Medius Response Palpable increased muscle length;Twitch response elicited    Gluteus Maximus Response Twitch response elicited;Palpable increased muscle length    Piriformis Response Twitch response elicited;Palpable increased muscle length                  PT Short Term Goals - 05/03/20 1932      PT SHORT TERM GOAL #1   Title Pt will be independent with her initial HEP to decrease pain and improve ability to be on her feet.    Time 4    Period Weeks    Status New    Target Date 05/30/20      PT SHORT TERM GOAL #2   Title Pt will report atleast 30% decrease in her back and leg pain/numbness.    Time 4    Period Weeks    Status New    Target Date 05/30/20             PT Long Term Goals - 05/05/20 1046      PT LONG TERM GOAL #1   Title Pt will demonstrate PSLR to 70 degrees on RLE without increased pain due to improved neural and muscular flexibility for pain management    Baseline 45 deg provokes pain    Time 12    Period Weeks    Status New    Target Date 07/25/20      PT LONG TERM GOAL #2   Title Pt will report atleast 70% improvement in low back and buttock pain with daily activity.    Baseline .Marland Kitchen    Time 12    Period Weeks    Status New      PT LONG TERM GOAL #3   Title reduce Rt LE numbness and tingling by 50% with household tasks and care of son    Time 12    Period Weeks    Status New      PT LONG TERM GOAL #4   Title Pt will be independent with her advanced HEP and verbalize understanding of exercise progressions which will allow her to maintain progress after discharge from PT.  Time 12    Period Weeks    Status New    Target Date 07/25/20      PT LONG TERM GOAL #5   Title Pt will report being able to walk 1 mile daily without increased pain or radicular symptoms due to  improved strength    Time 12    Period Weeks    Status New    Target Date 07/25/20                 Plan - 06/20/20 1226    Clinical Impression Statement Pt responded well to dry needling to gluteals and piriforming.  Overall, she has done well with th einitial HEP and able to add more gluteal strengthening toady. Pt will benefit from skilled PT to ensure muscle length of glutes and the Rt side and that she can continue to progress strength for improved function and ability to care for her son at home.    PT Treatment/Interventions ADLs/Self Care Home Management;Biofeedback;Cryotherapy;Electrical Stimulation;Moist Heat;Traction;Therapeutic activities;Therapeutic exercise;Neuromuscular re-education;Patient/family education;Manual techniques;Passive range of motion;Dry needling;Taping    PT Next Visit Plan f/u on clamshell and standing hip abduction, dry needling to Rt gluteals and piriformis    PT Home Exercise Plan Access Code: [email or web address]    Consulted and Agree with Plan of Care Patient           Patient will benefit from skilled therapeutic intervention in order to improve the following deficits and impairments:  Abnormal gait,Pain,Increased fascial restricitons,Decreased strength,Decreased activity tolerance,Decreased range of motion,Increased muscle spasms,Difficulty walking,Decreased endurance  Visit Diagnosis: Chronic bilateral low back pain, unspecified whether sciatica present  Muscle weakness (generalized)     Problem List Patient Active Problem List   Diagnosis Date Noted  . Closed fracture of 9th & 10th ribs of right side 12/17/2016  . Pneumothorax on right 12/17/2016  . Lumbar degenerative disc disease 12/17/2016  . Essential hypertension 12/17/2016  . Dyslipidemia 10/07/2016  . Pulmonary nodule 10/07/2016  . Abnormal CXR   . Chest pain 10/06/2016  . S/P lumbar spinal fusion 08/05/2016  . Obstructive sleep apnea syndrome, mild 10/27/2013  . Controlled type 2  diabetes mellitus without complication, without long-term current use of insulin ([place]) 06/19/2011  . GAD (generalized anxiety disorder) 11/01/2009    Jule Ser, PT 06/20/2020, 12:34 PM  [facility] [address], [place], [place], [address] Phone: [phone]   Fax:  [phone]  Name: MYKAILA BLUNCK MRN: [id number] Date of Birth: 06-Feb-1946

## 2020-06-21 NOTE — Progress Notes (Signed)
Please call patient and let them  know their  low dose Ct was read as a Lung RADS 2: nodules that are benign in appearance and behavior with a very low likelihood of becoming a clinically active cancer due to size or lack of growth. Recommendation per radiology is for a repeat LDCT in 12 months. .Please let them  know we will order and schedule their  annual screening scan for 06/2021. Please let them  know there was notation of CAD on their  scan.  Please remind the patient  that this is a non-gated exam therefore degree or severity of disease  cannot be determined. Please have them  follow up with their PCP regarding potential risk factor modification, dietary therapy or pharmacologic therapy if clinically indicated. Pt.  is  currently on statin therapy. Please place order for annual  screening scan for  06/2021 and fax results to PCP. Thanks so much.  There is documentation of CAD. She is on a statin

## 2020-06-24 ENCOUNTER — Other Ambulatory Visit: Payer: Self-pay | Admitting: *Deleted

## 2020-06-24 DIAGNOSIS — Z87891 Personal history of nicotine dependence: Secondary | ICD-10-CM

## 2020-07-04 ENCOUNTER — Ambulatory Visit: Payer: Medicare Other

## 2020-07-05 ENCOUNTER — Ambulatory Visit: Payer: Medicare Other | Attending: Neurosurgery | Admitting: Physical Therapy

## 2020-07-05 ENCOUNTER — Other Ambulatory Visit: Payer: Self-pay

## 2020-07-05 DIAGNOSIS — M6281 Muscle weakness (generalized): Secondary | ICD-10-CM

## 2020-07-05 DIAGNOSIS — G8929 Other chronic pain: Secondary | ICD-10-CM | POA: Diagnosis present

## 2020-07-05 DIAGNOSIS — M545 Low back pain, unspecified: Secondary | ICD-10-CM | POA: Insufficient documentation

## 2020-07-05 NOTE — Therapy (Signed)
[facility] [address] [place], [place], [address] Phone: [phone]   Fax:  [phone]  Physical Therapy Treatment  Patient Details  Name: Krista Austin MRN: [id number] Date of Birth: [birth year] Referring Provider (PT): Kary Kos, MD   Encounter Date: 07/05/2020   PT End of Session - 07/05/20 1012    Visit Number 3    Date for PT Re-Evaluation 07/25/20    Authorization Type medicare A/B    Progress Note Due on Visit 10    PT Start Time 0931    PT Stop Time 1015    PT Time Calculation (min) 44 min    Activity Tolerance Patient tolerated treatment well           Past Medical History:  Diagnosis Date  . Adenomatous polyp   . Arthritis    "mostly fingers" (10/07/2016)  . At high risk for falls   . 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 Chronic back pain    "worse in my neck; lower back also affected" (10/07/2016)  . Chronic cholecystitis 10/16/2016  . Complication of anesthesia    "never fully regained my taste after my cervical fusion; I don't take much RX so I'm very sensitive to any sedation" (10/07/2016)  . Concussion    X2  . DDD (degenerative disc disease), cervical    lumbar  . Depression    "hx; take Prozac to keep me stable" (10/07/2016)  . Dizziness    caused by compression of cervical disc  . Family history of adverse reaction to anesthesia    "daughters get PONV; one daughter breaks out severely; grandson got suspended in a state of anxiousness after kidney surgery; had to be held for hours"  . GERD (gastroesophageal reflux disease)   . Hyperlipidemia   . OSA on CPAP   . Pneumonia    "once as an adult" (10/07/2016)  . Thyroid nodule   . Type II diabetes mellitus ([place])    controlled by diet and exercide    Past Surgical History:  Procedure Laterality Date  . ANTERIOR CERVICAL DECOMP/DISCECTOMY FUSION  2015  . BACK SURGERY    . CARPAL  TUNNEL RELEASE Bilateral   . CATARACT EXTRACTION, BILATERAL Bilateral 09/10/2014 - 09/24/2014   by Darleen Crocker  . CHOLECYSTECTOMY N/A 10/16/2016   Procedure: LAPAROSCOPIC CHOLECYSTECTOMY;  Surgeon: [name], MD;  Location: [facility];  Service: General;  Laterality: N/A;  . LAPAROSCOPIC CHOLECYSTECTOMY  10/16/2016  . [place] SURGERY  2007  . TONSILLECTOMY  05/05/1983  . TUBAL LIGATION  02/09/1979    There were no vitals filed for this visit.   Subjective Assessment - 07/05/20 0935    Subjective I like the DN.  My neck is really bothering me.  I have degeneration all over in my spine.  I've been doing the ex's fine.  The only ex's that are difficult bridge and sidelying clams.    Patient Stated Goals be able walk 1 mile; do 2-3 hours of housework;do not want to come to clinic a lot because of high risk with Covid    Currently in Pain? Yes    Pain Score 4     Pain Location Buttocks    Pain Orientation Right                             [facility] Adult  PT Treatment/Exercise - 07/05/20 0001      Lumbar Exercises: Seated   Other Seated Lumbar Exercises review of HEP verbally to assess response and compliance      Moist Heat Therapy   Number Minutes Moist Heat 12 Minutes    Moist Heat Location Lumbar Spine;Hip      Electrical Stimulation   Electrical Stimulation Location thoracolumbar; right buttock    Electrical Stimulation Action Pre mod   prone   Electrical Stimulation Parameters 16 ma 12 min    Electrical Stimulation Goals Pain      Manual Therapy   Soft tissue mobilization bil lumbar paraspinals,gluteals and piriformis            Trigger Point Dry Needling - 07/05/20 0001    Consent Given? Yes    Education Handout Provided Previously provided   has had here in the past   Muscles Treated Back/Hip Gluteus minimus;Gluteus medius;Gluteus maximus;Piriformis;Lumbar multifidi    Other Dry Needling right prone and sidelying    Gluteus Minimus Response  Palpable increased muscle length;Twitch response elicited    Gluteus Medius Response Palpable increased muscle length;Twitch response elicited    Gluteus Maximus Response Twitch response elicited;Palpable increased muscle length    Piriformis Response Twitch response elicited;Palpable increased muscle length    Lumbar multifidi Response Palpable increased muscle length   bil               PT Education - 07/05/20 1012    Education Details TENs info for home use    Person(s) Educated Patient    Methods Explanation;Handout    Comprehension Verbalized understanding            PT Short Term Goals - 07/05/20 1314      PT SHORT TERM GOAL #1   Title Pt will be independent with her initial HEP to decrease pain and improve ability to be on her feet.    Status Achieved      PT SHORT TERM GOAL #2   Title Pt will report atleast 30% decrease in her back and leg pain/numbness.    Status Achieved      PT SHORT TERM GOAL #3   Title .Marland KitchenMarland Kitchen             PT Long Term Goals - 05/05/20 1046      PT LONG TERM GOAL #1   Title Pt will demonstrate PSLR to 70 degrees on RLE without increased pain due to improved neural and muscular flexibility for pain management    Baseline 45 deg provokes pain    Time 12    Period Weeks    Status New    Target Date 07/25/20      PT LONG TERM GOAL #2   Title Pt will report atleast 70% improvement in low back and buttock pain with daily activity.    Baseline .Marland Kitchen    Time 12    Period Weeks    Status New      PT LONG TERM GOAL #3   Title reduce Rt LE numbness and tingling by 50% with household tasks and care of son    Time 12    Period Weeks    Status New      PT LONG TERM GOAL #4   Title Pt will be independent with her advanced HEP and verbalize understanding of exercise progressions which will allow her to maintain progress after discharge from PT.     Time 12    Period Weeks  Status New    Target Date 07/25/20      PT LONG TERM GOAL #5    Title Pt will report being able to walk 1 mile daily without increased pain or radicular symptoms due to improved strength    Time 12    Period Weeks    Status New    Target Date 07/25/20                 Plan - 07/05/20 1306    Clinical Impression Statement The patient reports an exacerbation of cervical  and thoracic region pain and brought in her neck pillow for added support.  She expresses satisfaction in her present HEP and feels it is an appropriate amount and intensity at this time.  She had a positive response to DN and would like to continue with this intervention.  Added lumbar multifidi in addition to DN and manual therapy to gluteals and piriformis.  Much improved soft tissue mobility noted following.  Trial of ES/heat as a possible option for home (home TENS given) in managing multi regional chronic pain.  She was able to handle positioning in both prone and sidelying well.  Good pain relief reported.    Personal Factors and Comorbidities Age;Comorbidity 2;Time since onset of injury/illness/exacerbation    Examination-Activity Limitations Caring for Others;Carry;Lift;Bend;Squat    Rehab Potential Excellent    PT Frequency Biweekly    PT Duration 12 weeks    PT Treatment/Interventions ADLs/Self Care Home Management;Biofeedback;Cryotherapy;Electrical Stimulation;Moist Heat;Traction;Therapeutic activities;Therapeutic exercise;Neuromuscular re-education;Patient/family education;Manual techniques;Passive range of motion;Dry needling;Taping    PT Next Visit Plan f/u on clamshell and standing hip abduction, dry needling to lumbar multifidi;  DN Rt gluteals and piriformis; f/u on home TENS    PT Home Exercise Plan Access Code: [facility]    Recommended Other Services she may get a referral for treatment of cervical region after conclusion of this episode           Patient will benefit from skilled therapeutic intervention in order to improve the following deficits and impairments:   Abnormal gait,Pain,Increased fascial restricitons,Decreased strength,Decreased activity tolerance,Decreased range of motion,Increased muscle spasms,Difficulty walking,Decreased endurance  Visit Diagnosis: Chronic bilateral low back pain, unspecified whether sciatica present  Muscle weakness (generalized)     Problem List Patient Active Problem List   Diagnosis Date Noted  . Closed fracture of 9th & 10th ribs of right side 12/17/2016  . Pneumothorax on right 12/17/2016  . Lumbar degenerative disc disease 12/17/2016  . Essential hypertension 12/17/2016  . Dyslipidemia 10/07/2016  . Pulmonary nodule 10/07/2016  . Abnormal CXR   . Chest pain 10/06/2016  . S/P lumbar spinal fusion 08/05/2016  . Obstructive sleep apnea syndrome, mild 10/27/2013  . Controlled type 2 diabetes mellitus without complication, without long-term current use of insulin ([place]) 06/19/2011  . GAD (generalized anxiety disorder) 11/01/2009   Ruben Im, PT 07/05/20 1:15 PM Phone: [phone] Fax: [phone] Alvera Singh 07/05/2020, 1:15 PM  [facility] [address] [place], [place], [address] Phone: [phone]   Fax:  [phone]  Name: VENISHA BOEHNING MRN: [id number] Date of Birth: 05-Jul-1945

## 2020-07-05 NOTE — Patient Instructions (Signed)
TENS UNIT  This is helpful for muscle pain and spasm.   Search and Purchase a TENS 7000 2nd edition at www.tenspros.com or www.amazon.com  (It should be less than $30)     TENS unit instructions:  Do not shower or bathe with the unit on Turn the unit off before removing electrodes or batteries If the electrodes lose stickiness add a drop of water to the electrodes after they are disconnected from the unit and place on plastic sheet. If you continued to have difficulty, call the TENS unit company to purchase more electrodes. Do not apply lotion on the skin area prior to use. Make sure the skin is clean and dry as this will help prolong the life of the electrodes. After use, always check skin for unusual red areas, rash or other skin difficulties. If there are any skin problems, does not apply electrodes to the same area. Never remove the electrodes from the unit by pulling the wires. Do not use the TENS unit or electrodes other than as directed. Do not change electrode placement without consulting your therapist or physician. Keep 2 fingers with between each electrode.   TENS stands for Transcutaneous Electrical Nerve Stimulation. In other words, electrical impulses are allowed to pass through the skin in order to excite a nerve.   Purpose and Use of TENS:  TENS is a method used to manage acute and chronic pain without the use of drugs. It has been effective in managing pain associated with surgery, sprains, strains, trauma, rheumatoid arthritis, and neuralgias. It is a non-addictive, low risk, and non-invasive technique used to control pain. It is not, by any means, a curative form of treatment.   How TENS Works:  Most TENS units are a small pocket-sized unit powered by one 9 volt battery. Attached to the outside of the unit are two lead wires where two pins and/or snaps connect on each wire. All units come with a set of four reusable pads or electrodes. These are placed on the skin  surrounding the area involved. By inserting the leads into  the pads, the electricity can pass from the unit making the circuit complete.  As the intensity is turned up slowly, the electrical current enters the body from the electrodes through the skin to the surrounding nerve fibers. This triggers the release of hormones from within the body. These hormones contain pain relievers. By increasing the circulation of these hormones, the person's pain may be lessened. It is also believed that the electrical stimulation itself helps to block the pain messages being sent to the brain, thus also decreasing the body's perception of pain.   Hazards:  TENS units are NOT to be used by patients with PACEMAKERS, DEFIBRILLATORS, DIABETIC PUMPS, PREGNANT WOMEN, and patients with SEIZURE DISORDERS.  TENS units are NOT to be used over the heart, throat, brain, or spinal cord.  One of the major side effects from the TENS unit may be skin irritation. Some people may develop a rash if they are sensitive to the materials used in the electrodes or the connecting wires.     Avoid overuse due the body getting used to the stem making it not as effective over time.    

## 2020-07-16 ENCOUNTER — Encounter: Payer: Medicare Other | Admitting: Physical Therapy

## 2020-07-19 ENCOUNTER — Other Ambulatory Visit: Payer: Self-pay

## 2020-07-19 ENCOUNTER — Ambulatory Visit: Payer: Medicare Other | Admitting: Physical Therapy

## 2020-07-19 DIAGNOSIS — G8929 Other chronic pain: Secondary | ICD-10-CM

## 2020-07-19 DIAGNOSIS — M6281 Muscle weakness (generalized): Secondary | ICD-10-CM

## 2020-07-19 DIAGNOSIS — M545 Low back pain, unspecified: Secondary | ICD-10-CM

## 2020-07-19 NOTE — Therapy (Signed)
[facility] [address] [place], [place], [address] Phone: [phone]   Fax:  [phone]  Physical Therapy Treatment  Patient Details  Name: Krista Austin MRN: [id number] Date of Birth: 1945/07/15 Referring Provider (PT): Kary Kos, MD   Encounter Date: 07/19/2020   PT End of Session - 07/19/20 1213    Visit Number 4    Date for PT Re-Evaluation 07/25/20    Authorization Type medicare A/B    Progress Note Due on Visit 10    PT Start Time 0930    PT Stop Time 1015    PT Time Calculation (min) 45 min    Activity Tolerance Patient tolerated treatment well           Past Medical History:  Diagnosis Date  . Adenomatous polyp   . Arthritis    "mostly fingers" (10/07/2016)  . At high risk for falls   . 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 Chronic back pain    "worse in my neck; lower back also affected" (10/07/2016)  . Chronic cholecystitis 10/16/2016  . Complication of anesthesia    "never fully regained my taste after my cervical fusion; I don't take much RX so I'm very sensitive to any sedation" (10/07/2016)  . Concussion    X2  . DDD (degenerative disc disease), cervical    lumbar  . Depression    "hx; take Prozac to keep me stable" (10/07/2016)  . Dizziness    caused by compression of cervical disc  . Family history of adverse reaction to anesthesia    "daughters get PONV; one daughter breaks out severely; grandson got suspended in a state of anxiousness after kidney surgery; had to be held for hours"  . GERD (gastroesophageal reflux disease)   . Hyperlipidemia   . OSA on CPAP   . Pneumonia    "once as an adult" (10/07/2016)  . Thyroid nodule   . Type II diabetes mellitus ([place])    controlled by diet and exercide    Past Surgical History:  Procedure Laterality Date  . ANTERIOR CERVICAL DECOMP/DISCECTOMY FUSION  2015  . BACK SURGERY    . CARPAL  TUNNEL RELEASE Bilateral   . CATARACT EXTRACTION, BILATERAL Bilateral 09/10/2014 - 09/24/2014   by Darleen Crocker  . CHOLECYSTECTOMY N/A 10/16/2016   Procedure: LAPAROSCOPIC CHOLECYSTECTOMY;  Surgeon: [name], MD;  Location: [facility];  Service: General;  Laterality: N/A;  . LAPAROSCOPIC CHOLECYSTECTOMY  10/16/2016  . [place] SURGERY  2007  . TONSILLECTOMY  05/05/1983  . TUBAL LIGATION  02/09/1979    There were no vitals filed for this visit.   Subjective Assessment - 07/19/20 0932    Subjective I got my home TENs unit and that has really helped.  Dull pain right buttock 1/10. Doing ex's every day.  Doing brisk walks.  Occasionally 4x of traveling down leg to foot.  It used to be constant.    Pertinent History DM, stage III kidney;    How long can you walk comfortably? 15-20 minutes (initially only 1/2 mile)  50 laps .8 mile    Diagnostic tests MRI shows disc bulging    Patient Stated Goals be able walk 1 mile; do 2-3 hours of housework;do not want to come to clinic a lot because of high risk with Covid    Currently in Pain? Yes    Pain Score 1  Pain Location Back    Pain Orientation Right                             [facility] Adult PT Treatment/Exercise - 07/19/20 0001      Lumbar Exercises: Seated   Other Seated Lumbar Exercises seated neural floss 10x right/left    Other Seated Lumbar Exercises discussion of HEP and current response to treatment      Moist Heat Therapy   Number Minutes Moist Heat 3 Minutes      Electrical Stimulation   Electrical Stimulation Location bil lumbar multifidi    Electrical Stimulation Action pre-mod    Electrical Stimulation Parameters with DN 8 min    Electrical Stimulation Goals Pain      Manual Therapy   Soft tissue mobilization bil lumbar paraspinals,gluteals and piriformis            Trigger Point Dry Needling - 07/19/20 0001    Consent Given? Yes    Education Handout Provided Previously provided   has had here  in the past   Muscles Treated Back/Hip Gluteus minimus;Gluteus medius;Gluteus maximus;Piriformis;Lumbar multifidi    Other Dry Needling right prone and sidelying    Gluteus Minimus Response Palpable increased muscle length;Twitch response elicited    Gluteus Medius Response Palpable increased muscle length;Twitch response elicited    Gluteus Maximus Response Twitch response elicited;Palpable increased muscle length    Piriformis Response Twitch response elicited;Palpable increased muscle length    Lumbar multifidi Response Palpable increased muscle length   bil               PT Education - 07/19/20 1213    Education Details seated neural floss    Person(s) Educated Patient    Methods Explanation;Demonstration;Handout    Comprehension Returned demonstration;Verbalized understanding            PT Short Term Goals - 07/05/20 1314      PT SHORT TERM GOAL #1   Title Pt will be independent with her initial HEP to decrease pain and improve ability to be on her feet.    Status Achieved      PT SHORT TERM GOAL #2   Title Pt will report atleast 30% decrease in her back and leg pain/numbness.    Status Achieved      PT SHORT TERM GOAL #3   Title .Marland KitchenMarland Kitchen             PT Long Term Goals - 05/05/20 1046      PT LONG TERM GOAL #1   Title Pt will demonstrate PSLR to 70 degrees on RLE without increased pain due to improved neural and muscular flexibility for pain management    Baseline 45 deg provokes pain    Time 12    Period Weeks    Status New    Target Date 07/25/20      PT LONG TERM GOAL #2   Title Pt will report atleast 70% improvement in low back and buttock pain with daily activity.    Baseline .Marland Kitchen    Time 12    Period Weeks    Status New      PT LONG TERM GOAL #3   Title reduce Rt LE numbness and tingling by 50% with household tasks and care of son    Time 12    Period Weeks    Status New      PT LONG TERM GOAL #4  Title Pt will be independent with her advanced  HEP and verbalize understanding of exercise progressions which will allow her to maintain progress after discharge from PT.     Time 12    Period Weeks    Status New    Target Date 07/25/20      PT LONG TERM GOAL #5   Title Pt will report being able to walk 1 mile daily without increased pain or radicular symptoms due to improved strength    Time 12    Period Weeks    Status New    Target Date 07/25/20                 Plan - 07/19/20 1011    Clinical Impression Statement The patient still have radiating LE symptoms but has improved significantly from constant to only 4x/week.  Much improved soft tissue mobility in lumbar fascia as well as decreased tender point size and number in gluteals and piriformis.    She demonstrates good carryover with HEP and added neural floss for improved sciatic nerve mobility.    Personal Factors and Comorbidities Age;Comorbidity 2;Time since onset of injury/illness/exacerbation    Examination-Participation Restrictions Cleaning;Community Activity;Meal Prep    Rehab Potential Excellent    PT Frequency Biweekly    PT Duration 12 weeks    PT Treatment/Interventions ADLs/Self Care Home Management;Biofeedback;Cryotherapy;Electrical Stimulation;Moist Heat;Traction;Therapeutic activities;Therapeutic exercise;Neuromuscular re-education;Patient/family education;Manual techniques;Passive range of motion;Dry needling;Taping    PT Next Visit Plan ERO next visit with weekly to biweekly follow up for 4-5 more visits then she plans to follow up with PCP for PT order for neck pain;  DN with ES to bil cervical multifidi, right glutes and piriformis    PT Home Exercise Plan Access Code: [facility]           Patient will benefit from skilled therapeutic intervention in order to improve the following deficits and impairments:  Abnormal gait,Pain,Increased fascial restricitons,Decreased strength,Decreased activity tolerance,Decreased range of motion,Increased muscle  spasms,Difficulty walking,Decreased endurance  Visit Diagnosis: Chronic bilateral low back pain, unspecified whether sciatica present  Muscle weakness (generalized)     Problem List Patient Active Problem List   Diagnosis Date Noted  . Closed fracture of 9th & 10th ribs of right side 12/17/2016  . Pneumothorax on right 12/17/2016  . Lumbar degenerative disc disease 12/17/2016  . Essential hypertension 12/17/2016  . Dyslipidemia 10/07/2016  . Pulmonary nodule 10/07/2016  . Abnormal CXR   . Chest pain 10/06/2016  . S/P lumbar spinal fusion 08/05/2016  . Obstructive sleep apnea syndrome, mild 10/27/2013  . Controlled type 2 diabetes mellitus without complication, without long-term current use of insulin ([place]) 06/19/2011  . GAD (generalized anxiety disorder) 11/01/2009   Ruben Im, PT 07/19/20 12:19 PM Phone: [phone] Fax: [phone] Alvera Singh 07/19/2020, 12:19 PM  [facility] [address], [place], [place], [address] Phone: [phone]   Fax:  [phone]  Name: LEESA LEIFHEIT MRN: [id number] Date of Birth: October 29, 1945

## 2020-07-19 NOTE — Patient Instructions (Signed)
Access Code: [email or web address] URL: [email or web address] Date: 07/19/2020 Prepared by: Ruben Im  Exercises Supine Double Knee to Chest - 1 x daily - 7 x weekly - 1 sets - 3 reps - 20-30 hold Supine Hip Internal and External Rotation - 1 x daily - 7 x weekly - 1 sets - 10 reps - 5 sec hold Hooklying Hamstring Stretch with Strap - 1 x daily - 7 x weekly - 1 sets - 3 reps - 30 sec hold Supine ITB Stretch with Strap - 1 x daily - 7 x weekly - 1 sets - 3 reps - 30 sec hold Supine Sciatic Nerve Glide - 1 x daily - 7 x weekly - 1 sets - 10 reps Supine Figure 4 Piriformis Stretch - 1 x daily - 7 x weekly - 3 reps - 1 sets - 30 sec hold Hooklying Small March - 1 x daily - 7 x weekly - 3 sets - 10 reps Hooklying Clamshell with Resistance - 1 x daily - 7 x weekly - 3 sets - 10 reps Clamshell with Resistance - 1 x daily - 7 x weekly - 3 sets - 10 reps Supine Bridge - 1 x daily - 7 x weekly - 3 sets - 10 reps - 3 hold Standing Hip Abduction with Counter Support - 1 x daily - 7 x weekly - 3 sets - 10 reps Seated Slump Nerve Glide - 1 x daily - 7 x weekly - 1 sets - 10 reps

## 2020-07-30 ENCOUNTER — Ambulatory Visit: Payer: Medicare Other | Admitting: Physical Therapy

## 2020-07-30 ENCOUNTER — Other Ambulatory Visit: Payer: Self-pay

## 2020-07-30 DIAGNOSIS — G8929 Other chronic pain: Secondary | ICD-10-CM

## 2020-07-30 DIAGNOSIS — M6281 Muscle weakness (generalized): Secondary | ICD-10-CM

## 2020-07-30 DIAGNOSIS — M545 Low back pain, unspecified: Secondary | ICD-10-CM | POA: Diagnosis not present

## 2020-07-30 NOTE — Therapy (Signed)
[facility] [address], [place], [place], [address] Phone: [phone]   Fax:  [phone]  Physical Therapy Treatment/Recertification  Patient Details  Name: Krista Austin MRN: [id number] Date of Birth: October 07, 1945 Referring Provider (PT): Kary Kos, MD   Encounter Date: 07/30/2020   PT End of Session - 07/30/20 1052    Visit Number 5    Date for PT Re-Evaluation 09/24/20    Authorization Type medicare A/B    Progress Note Due on Visit 10    PT Start Time 1016    PT Stop Time 1059    PT Time Calculation (min) 43 min    Activity Tolerance Patient tolerated treatment well           Past Medical History:  Diagnosis Date  . Adenomatous polyp   . Arthritis    "mostly fingers" (10/07/2016)  . At high risk for falls   . 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 Chronic back pain    "worse in my neck; lower back also affected" (10/07/2016)  . Chronic cholecystitis 10/16/2016  . Complication of anesthesia    "never fully regained my taste after my cervical fusion; I don't take much RX so I'm very sensitive to any sedation" (10/07/2016)  . Concussion    X2  . DDD (degenerative disc disease), cervical    lumbar  . Depression    "hx; take Prozac to keep me stable" (10/07/2016)  . Dizziness    caused by compression of cervical disc  . Family history of adverse reaction to anesthesia    "daughters get PONV; one daughter breaks out severely; grandson got suspended in a state of anxiousness after kidney surgery; had to be held for hours"  . GERD (gastroesophageal reflux disease)   . Hyperlipidemia   . OSA on CPAP   . Pneumonia    "once as an adult" (10/07/2016)  . Thyroid nodule   . Type II diabetes mellitus ([place])    controlled by diet and exercide    Past Surgical History:  Procedure Laterality Date  . ANTERIOR CERVICAL DECOMP/DISCECTOMY FUSION  2015  . BACK SURGERY     . CARPAL TUNNEL RELEASE Bilateral   . CATARACT EXTRACTION, BILATERAL Bilateral 09/10/2014 - 09/24/2014   by Darleen Crocker  . CHOLECYSTECTOMY N/A 10/16/2016   Procedure: LAPAROSCOPIC CHOLECYSTECTOMY;  Surgeon: [name], MD;  Location: [facility];  Service: General;  Laterality: N/A;  . LAPAROSCOPIC CHOLECYSTECTOMY  10/16/2016  . [place] SURGERY  2007  . TONSILLECTOMY  05/05/1983  . TUBAL LIGATION  02/09/1979    There were no vitals filed for this visit.   Subjective Assessment - 07/30/20 1019    Subjective It hasn't been the greatest week.  Now I have bil LE symptoms to the calf on the left.  Sitting to drive to and from [place]. [place] about killed me on both sides.  I've tried multiple rolls and cushions.  I also use the lumbar and thoracic supports.  I reduced the walking b/c it would aggravate the leg symptoms.  Lifting    Currently in Pain? Yes          5/10  LBP with bil LE symptoms right  > left;  Worse with sitting/driving    [facility] PT Assessment - 07/30/20 0001      Assessment   Medical Diagnosis M51.26 (ICD-10-CM) - Other intervertebral disc displacement, lumbar region  Referring Provider (PT) Kary Kos, MD      AROM   Overall AROM Comments lumbar extension 25% limited; flexion WFLs      Special Tests   Other special tests not retested but expect +SLR and +slump                         [facility] Adult PT Treatment/Exercise - 07/30/20 0001      Self-Care   Other Self-Care Comments  cushions for sitting/behind the back; use of heat; peripheralization vs. centralization; symptom management      Lumbar Exercises: Standing   Other Standing Lumbar Exercises standing lumbar extension at the counter 10x trial of standing extension before or after prolonged sitting      Traction   Type of Traction Lumbar    Min (lbs) 45    Max (lbs) 75    Hold Time 45    Rest Time 15    Time 15                    PT Short Term Goals - 07/05/20 1314      PT  SHORT TERM GOAL #1   Title Pt will be independent with her initial HEP to decrease pain and improve ability to be on her feet.    Status Achieved      PT SHORT TERM GOAL #2   Title Pt will report atleast 30% decrease in her back and leg pain/numbness.    Status Achieved      PT SHORT TERM GOAL #3   Title .Marland KitchenMarland Kitchen             PT Long Term Goals - 07/30/20 1434      PT LONG TERM GOAL #1   Title Pt will demonstrate PSLR to 70 degrees on RLE without increased pain due to improved neural and muscular flexibility for pain management    Time 8    Period Weeks    Status On-going    Target Date 09/24/20      PT LONG TERM GOAL #2   Title Pt will report atleast 70% improvement in low back and buttock pain with daily activity.    Time 8    Period Weeks    Status On-going      PT LONG TERM GOAL #3   Title reduce Rt LE numbness and tingling by 50% with household tasks and care of son    Time 8    Period Weeks    Status On-going      PT LONG TERM GOAL #4   Title Pt will be independent with her advanced HEP and verbalize understanding of exercise progressions which will allow her to maintain progress after discharge from PT.     Time 8    Period Weeks    Status On-going      PT LONG TERM GOAL #5   Title Pt will report being able to walk 1 mile daily without increased pain or radicular symptoms due to improved strength    Status Achieved                 Plan - 07/30/20 1045    Clinical Impression Statement The patient reports bilateral LE symptoms now rather than right only and highly irritable after driving to [place]. [place] recently.  Initiated trial of traction with a positive immediate response with relief of LE symptoms.  She also responds well to lumbar extension in  standing.  Discussed goal of centralization and avoidance of positions or activities which exacerbate LE symptoms.  Therapist closely monitoring response throughout treatment session.  She has not met many STGS and LTGS  secondary to recent flare up of symptoms.  She is limited in attendence to 1x/week secondary to lack of caregiver assistance for her son with Downs Syndrome.  Recommend continuing PT for symptom mangement.    Personal Factors and Comorbidities Age;Comorbidity 2;Time since onset of injury/illness/exacerbation    Comorbidities DM; CKD    Examination-Activity Limitations Caring for Others;Carry;Lift;Bend;Squat    Examination-Participation Restrictions Cleaning;Community Activity;Meal Prep    Stability/Clinical Decision Making Evolving/Moderate complexity    Rehab Potential Excellent    PT Frequency 1x / week    PT Duration 12 weeks    PT Treatment/Interventions ADLs/Self Care Home Management;Biofeedback;Cryotherapy;Electrical Stimulation;Moist Heat;Traction;Therapeutic activities;Therapeutic exercise;Neuromuscular re-education;Patient/family education;Manual techniques;Passive range of motion;Dry needling;Taping    PT Next Visit Plan assess response to lumbar traction and standing lumbar extension;  DN with ES as needed    PT Home Exercise Plan Access Code: [facility]           Patient will benefit from skilled therapeutic intervention in order to improve the following deficits and impairments:  Abnormal gait,Pain,Increased fascial restricitons,Decreased strength,Decreased activity tolerance,Decreased range of motion,Increased muscle spasms,Difficulty walking,Decreased endurance  Visit Diagnosis: Chronic bilateral low back pain, unspecified whether sciatica present  Muscle weakness (generalized)     Problem List Patient Active Problem List   Diagnosis Date Noted  . Closed fracture of 9th & 10th ribs of right side 12/17/2016  . Pneumothorax on right 12/17/2016  . Lumbar degenerative disc disease 12/17/2016  . Essential hypertension 12/17/2016  . Dyslipidemia 10/07/2016  . Pulmonary nodule 10/07/2016  . Abnormal CXR   . Chest pain 10/06/2016  . S/P lumbar spinal fusion 08/05/2016  .  Obstructive sleep apnea syndrome, mild 10/27/2013  . Controlled type 2 diabetes mellitus without complication, without long-term current use of insulin ([place]) 06/19/2011  . GAD (generalized anxiety disorder) 11/01/2009     Ruben Im, PT 07/30/20 2:39 PM Phone: [phone] Fax: [phone] Alvera Singh     07/30/2020, 2:35 PM  [facility] [address] [place], [place], [address] Phone: [phone]   Fax:  [phone]  Name: Krista Austin MRN: [id number] Date of Birth: 1946-03-26

## 2020-08-06 ENCOUNTER — Ambulatory Visit: Payer: Medicare Other | Admitting: Physical Therapy

## 2020-08-13 ENCOUNTER — Ambulatory Visit: Payer: Medicare Other | Attending: Neurosurgery | Admitting: Physical Therapy

## 2020-08-13 ENCOUNTER — Other Ambulatory Visit: Payer: Self-pay

## 2020-08-13 DIAGNOSIS — M6281 Muscle weakness (generalized): Secondary | ICD-10-CM | POA: Diagnosis present

## 2020-08-13 DIAGNOSIS — M545 Low back pain, unspecified: Secondary | ICD-10-CM | POA: Diagnosis not present

## 2020-08-13 DIAGNOSIS — M542 Cervicalgia: Secondary | ICD-10-CM | POA: Insufficient documentation

## 2020-08-13 DIAGNOSIS — G8929 Other chronic pain: Secondary | ICD-10-CM | POA: Insufficient documentation

## 2020-08-13 NOTE — Therapy (Signed)
[facility] [address] [place], [place], [address] Phone: [phone]   Fax:  [phone]  Physical Therapy Treatment  Patient Details  Name: Krista Austin MRN: [id number] Date of Birth: [birth year] Referring Provider (PT): Kary Kos, MD   Encounter Date: 08/13/2020   PT End of Session - 08/13/20 1048    Visit Number 6    Date for PT Re-Evaluation 09/24/20    Authorization Type medicare A/B    Progress Note Due on Visit 10    PT Start Time 1012    PT Stop Time 1055    PT Time Calculation (min) 43 min    Activity Tolerance Patient tolerated treatment well           Past Medical History:  Diagnosis Date  . Adenomatous polyp   . Arthritis    "mostly fingers" (10/07/2016)  . At high risk for falls   . 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 Chronic back pain    "worse in my neck; lower back also affected" (10/07/2016)  . Chronic cholecystitis 10/16/2016  . Complication of anesthesia    "never fully regained my taste after my cervical fusion; I don't take much RX so I'm very sensitive to any sedation" (10/07/2016)  . Concussion    X2  . DDD (degenerative disc disease), cervical    lumbar  . Depression    "hx; take Prozac to keep me stable" (10/07/2016)  . Dizziness    caused by compression of cervical disc  . Family history of adverse reaction to anesthesia    "daughters get PONV; one daughter breaks out severely; grandson got suspended in a state of anxiousness after kidney surgery; had to be held for hours"  . GERD (gastroesophageal reflux disease)   . Hyperlipidemia   . OSA on CPAP   . Pneumonia    "once as an adult" (10/07/2016)  . Thyroid nodule   . Type II diabetes mellitus ([place])    controlled by diet and exercide    Past Surgical History:  Procedure Laterality Date  . ANTERIOR CERVICAL DECOMP/DISCECTOMY FUSION  2015  . BACK SURGERY    . CARPAL  TUNNEL RELEASE Bilateral   . CATARACT EXTRACTION, BILATERAL Bilateral 09/10/2014 - 09/24/2014   by Darleen Crocker  . CHOLECYSTECTOMY N/A 10/16/2016   Procedure: LAPAROSCOPIC CHOLECYSTECTOMY;  Surgeon: [name], MD;  Location: [facility];  Service: General;  Laterality: N/A;  . LAPAROSCOPIC CHOLECYSTECTOMY  10/16/2016  . [place] SURGERY  2007  . TONSILLECTOMY  05/05/1983  . TUBAL LIGATION  02/09/1979    There were no vitals filed for this visit.   Subjective Assessment - 08/13/20 1014    Subjective I had the diarrhea virus last week and I had a neck spell.    It took me about a week.  I'm taking an increased dose of the Gabapentin.  I'm wearing a lumbar wrap which helps.  Went ahead with me ex's.  Only the clams aggravate so I stopped doing that.  Some days numbness to heel with walking so I stop.  I found the right cushion for the car it's an outdoor cushion.  I think the traction helped.    Pertinent History DM, stage III kidney; osteoporosis (mild)    How long can you walk comfortably? 15-20 minutes (initially only 1/2 mile)  50 laps .8 mile    Patient Stated Goals be able  walk 1 mile; do 2-3 hours of housework;do not want to come to clinic a lot because of high risk with Covid    Currently in Pain? Yes    Pain Score 3     Pain Location Back                             [facility] Adult PT Treatment/Exercise - 08/13/20 0001      Self-Care   Other Self-Care Comments  cushions for sitting/behind the back; use of heat; peripheralization vs. centralization; symptom management      Neuro Re-ed    Neuro Re-ed Details  neuroscience of pain education      Lumbar Exercises: Prone   Other Prone Lumbar Exercises prop on forearms    Other Prone Lumbar Exercises press ups with hands limited quantity secondary to CTS      Traction   Type of Traction Lumbar    Min (lbs) 45    Max (lbs) 85    Hold Time 45    Rest Time 15    Time 15                  PT Education -  08/13/20 1048    Education Details press ups, prone prop; standing extension    Person(s) Educated Patient    Methods Explanation;Demonstration;Handout    Comprehension Returned demonstration;Verbalized understanding            PT Short Term Goals - 07/05/20 1314      PT SHORT TERM GOAL #1   Title Pt will be independent with her initial HEP to decrease pain and improve ability to be on her feet.    Status Achieved      PT SHORT TERM GOAL #2   Title Pt will report atleast 30% decrease in her back and leg pain/numbness.    Status Achieved      PT SHORT TERM GOAL #3   Title .Marland KitchenMarland Kitchen             PT Long Term Goals - 07/30/20 1434      PT LONG TERM GOAL #1   Title Pt will demonstrate PSLR to 70 degrees on RLE without increased pain due to improved neural and muscular flexibility for pain management    Time 8    Period Weeks    Status On-going    Target Date 09/24/20      PT LONG TERM GOAL #2   Title Pt will report atleast 70% improvement in low back and buttock pain with daily activity.    Time 8    Period Weeks    Status On-going      PT LONG TERM GOAL #3   Title reduce Rt LE numbness and tingling by 50% with household tasks and care of son    Time 8    Period Weeks    Status On-going      PT LONG TERM GOAL #4   Title Pt will be independent with her advanced HEP and verbalize understanding of exercise progressions which will allow her to maintain progress after discharge from PT.     Time 8    Period Weeks    Status On-going      PT LONG TERM GOAL #5   Title Pt will report being able to walk 1 mile daily without increased pain or radicular symptoms due to improved strength    Status Achieved  Plan - 08/13/20 1045    Clinical Impression Statement Despite recent illness, the patient reports decreased intensity of symptoms.  Still present in buttock and thigh but less intense overall.  Positive response to extension biased ex's although limited  in quantity of reps in prone secondary to history of carpal tunnel.  Decreased LE symptoms during and following traction as well.  Therapist monitoring response throughout treatment session.    Personal Factors and Comorbidities Age;Comorbidity 2;Time since onset of injury/illness/exacerbation    Comorbidities DM; CKD; carpal tunnel    Rehab Potential Excellent    PT Frequency 1x / week    PT Duration 12 weeks    PT Treatment/Interventions ADLs/Self Care Home Management;Biofeedback;Cryotherapy;Electrical Stimulation;Moist Heat;Traction;Therapeutic activities;Therapeutic exercise;Neuromuscular re-education;Patient/family education;Manual techniques;Passive range of motion;Dry needling;Taping    PT Next Visit Plan lumbar traction;  extensionbias ex;  DN with ES as needed; pt may get a referral from Dr. Saintclair Halsted to add cervical region to plan of care    PT Home Exercise Plan Access Code: [facility]    Recommended Other Services upcoming trip in May to [place]; may get referral for cervicogenic headache           Patient will benefit from skilled therapeutic intervention in order to improve the following deficits and impairments:  Abnormal gait,Pain,Increased fascial restricitons,Decreased strength,Decreased activity tolerance,Decreased range of motion,Increased muscle spasms,Difficulty walking,Decreased endurance  Visit Diagnosis: Chronic bilateral low back pain, unspecified whether sciatica present  Muscle weakness (generalized)     Problem List Patient Active Problem List   Diagnosis Date Noted  . Closed fracture of 9th & 10th ribs of right side 12/17/2016  . Pneumothorax on right 12/17/2016  . Lumbar degenerative disc disease 12/17/2016  . Essential hypertension 12/17/2016  . Dyslipidemia 10/07/2016  . Pulmonary nodule 10/07/2016  . Abnormal CXR   . Chest pain 10/06/2016  . S/P lumbar spinal fusion 08/05/2016  . Obstructive sleep apnea syndrome, mild 10/27/2013  . Controlled type  2 diabetes mellitus without complication, without long-term current use of insulin ([place]) 06/19/2011  . GAD (generalized anxiety disorder) 11/01/2009   Ruben Im, PT 08/13/20 9:30 PM Phone: [phone] Fax: [phone] Alvera Singh 08/13/2020, 9:30 PM  [facility] [address], [place], [place], [address] Phone: [phone]   Fax:  [phone]  Name: SKY PRIMO MRN: [id number] Date of Birth: 01/13/1946

## 2020-08-13 NOTE — Patient Instructions (Signed)
Access Code: [email or web address] URL: [email or web address] Date: 08/13/2020 Prepared by: Ruben Im  Exercises Supine Double Knee to Chest - 1 x daily - 7 x weekly - 1 sets - 3 reps - 20-30 hold Supine Hip Internal and External Rotation - 1 x daily - 7 x weekly - 1 sets - 10 reps - 5 sec hold Hooklying Hamstring Stretch with Strap - 1 x daily - 7 x weekly - 1 sets - 3 reps - 30 sec hold Supine ITB Stretch with Strap - 1 x daily - 7 x weekly - 1 sets - 3 reps - 30 sec hold Supine Sciatic Nerve Glide - 1 x daily - 7 x weekly - 1 sets - 10 reps Supine Figure 4 Piriformis Stretch - 1 x daily - 7 x weekly - 3 reps - 1 sets - 30 sec hold Hooklying Small March - 1 x daily - 7 x weekly - 3 sets - 10 reps Hooklying Clamshell with Resistance - 1 x daily - 7 x weekly - 3 sets - 10 reps Clamshell with Resistance - 1 x daily - 7 x weekly - 3 sets - 10 reps Supine Bridge - 1 x daily - 7 x weekly - 3 sets - 10 reps - 3 hold Standing Hip Abduction with Counter Support - 1 x daily - 7 x weekly - 3 sets - 10 reps Seated Slump Nerve Glide - 1 x daily - 7 x weekly - 1 sets - 10 reps Prone Press Up - 4-5 x daily - 7 x weekly - 1 sets - 5-6 reps Static Prone on Elbows - 1 x daily - 7 x weekly - 1 sets - 3 reps - 60 hold Standing Lumbar Extension - 1 x daily - 7 x weekly - 1 sets - 10 reps Standing Lumbar Extension with Counter - 1 x daily - 7 x weekly - 1 sets - 10 reps

## 2020-08-21 ENCOUNTER — Ambulatory Visit: Payer: Medicare Other

## 2020-08-27 ENCOUNTER — Other Ambulatory Visit: Payer: Self-pay

## 2020-08-27 ENCOUNTER — Ambulatory Visit: Payer: Medicare Other | Admitting: Physical Therapy

## 2020-08-27 DIAGNOSIS — M542 Cervicalgia: Secondary | ICD-10-CM

## 2020-08-27 DIAGNOSIS — M6281 Muscle weakness (generalized): Secondary | ICD-10-CM

## 2020-08-27 DIAGNOSIS — G8929 Other chronic pain: Secondary | ICD-10-CM

## 2020-08-27 DIAGNOSIS — M545 Low back pain, unspecified: Secondary | ICD-10-CM

## 2020-08-27 NOTE — Patient Instructions (Signed)
Access Code: [email or web address] URL: [email or web address] Date: 08/27/2020 Prepared by: Ruben Im  Exercises Supine Double Knee to Chest - 1 x daily - 7 x weekly - 1 sets - 3 reps - 20-30 hold Supine Hip Internal and External Rotation - 1 x daily - 7 x weekly - 1 sets - 10 reps - 5 sec hold Hooklying Hamstring Stretch with Strap - 1 x daily - 7 x weekly - 1 sets - 3 reps - 30 sec hold Supine ITB Stretch with Strap - 1 x daily - 7 x weekly - 1 sets - 3 reps - 30 sec hold Supine Sciatic Nerve Glide - 1 x daily - 7 x weekly - 1 sets - 10 reps Supine Figure 4 Piriformis Stretch - 1 x daily - 7 x weekly - 3 reps - 1 sets - 30 sec hold Hooklying Small March - 1 x daily - 7 x weekly - 3 sets - 10 reps Hooklying Clamshell with Resistance - 1 x daily - 7 x weekly - 3 sets - 10 reps Clamshell with Resistance - 1 x daily - 7 x weekly - 3 sets - 10 reps Supine Bridge - 1 x daily - 7 x weekly - 3 sets - 10 reps - 3 hold Standing Hip Abduction with Counter Support - 1 x daily - 7 x weekly - 3 sets - 10 reps Seated Slump Nerve Glide - 1 x daily - 7 x weekly - 1 sets - 10 reps Prone Press Up - 4-5 x daily - 7 x weekly - 1 sets - 5-6 reps Static Prone on Elbows - 1 x daily - 7 x weekly - 1 sets - 3 reps - 60 hold Standing Lumbar Extension - 1 x daily - 7 x weekly - 1 sets - 10 reps Standing Lumbar Extension with Counter - 1 x daily - 7 x weekly - 1 sets - 10 reps Standing Row with Anchored Resistance - 1 x daily - 7 x weekly - 1 sets - 10 reps Shoulder Extension with Resistance - 1 x daily - 7 x weekly - 1 sets - 10 reps Shoulder External Rotation with Anchored Resistance - 1 x daily - 7 x weekly - 1 sets - 10 reps

## 2020-08-27 NOTE — Therapy (Signed)
[facility] [address], [place], [place], [address] Phone: [phone]   Fax:  [phone]  Physical Therapy Treatment/Recertification   Patient Details  Name: Krista Austin MRN: [id number] Date of Birth: 08-Jul-1945 Referring Provider (PT): Kary Kos, MD   Encounter Date: 08/27/2020   PT End of Session - 08/27/20 1256    Visit Number 7    Date for PT Re-Evaluation 11/19/20    Authorization Type medicare A/B    Progress Note Due on Visit 10    PT Start Time [id number]    PT Stop Time 1234    PT Time Calculation (min) 47 min    Activity Tolerance Patient tolerated treatment well           Past Medical History:  Diagnosis Date  . Adenomatous polyp   . Arthritis    "mostly fingers" (10/07/2016)  . At high risk for falls   . 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 Chronic back pain    "worse in my neck; lower back also affected" (10/07/2016)  . Chronic cholecystitis 10/16/2016  . Complication of anesthesia    "never fully regained my taste after my cervical fusion; I don't take much RX so I'm very sensitive to any sedation" (10/07/2016)  . Concussion    X2  . DDD (degenerative disc disease), cervical    lumbar  . Depression    "hx; take Prozac to keep me stable" (10/07/2016)  . Dizziness    caused by compression of cervical disc  . Family history of adverse reaction to anesthesia    "daughters get PONV; one daughter breaks out severely; grandson got suspended in a state of anxiousness after kidney surgery; had to be held for hours"  . GERD (gastroesophageal reflux disease)   . Hyperlipidemia   . OSA on CPAP   . Pneumonia    "once as an adult" (10/07/2016)  . Thyroid nodule   . Type II diabetes mellitus ([place])    controlled by diet and exercide    Past Surgical History:  Procedure Laterality Date  . ANTERIOR CERVICAL DECOMP/DISCECTOMY FUSION  2015  . BACK SURGERY     . CARPAL TUNNEL RELEASE Bilateral   . CATARACT EXTRACTION, BILATERAL Bilateral 09/10/2014 - 09/24/2014   by Darleen Crocker  . CHOLECYSTECTOMY N/A 10/16/2016   Procedure: LAPAROSCOPIC CHOLECYSTECTOMY;  Surgeon: [name], MD;  Location: [place];  Service: General;  Laterality: N/A;  . LAPAROSCOPIC CHOLECYSTECTOMY  10/16/2016  . [place] SURGERY  2007  . TONSILLECTOMY  05/05/1983  . TUBAL LIGATION  02/09/1979    There were no vitals filed for this visit.   Subjective Assessment - 08/27/20 1149    Subjective I had a lot of discomfort in my low back from the traction so I don't think I want to do that.  Since then I've been walking my 50 rounds instead of just 30.  I didn't the prone press ups b/c it's hard to get on/off the floor.   I've had more trouble with my neck so I want to switch gears to that now.  It affects my dizziness.  Left > right shoulder.  No distal UE symptoms but my left grip is weaker.  (left hand dominant)  I'm game for the DN.  Posterior head pain that sometimes radiates to the front of my head.    Pertinent History DM, stage III kidney; osteoporosis (mild)  How long can you walk comfortably? 15-20 minutes (initially only 1/2 mile)  50 laps .8 mile    Diagnostic tests MRI shows disc bulging    Patient Stated Goals be able walk 1 mile; do 2-3 hours of housework;do not want to come to clinic a lot because of high risk with Covid    Currently in Pain? No/denies    Pain Score 0-No pain   last week there was 5 days that were bad and vertigo Sunday   Pain Location Neck    Pain Orientation Right;Left    Aggravating Factors  sitting/driving for back; standing for cooking too long    Pain Relieving Factors heat; brace for standing              [facility] PT Assessment - 08/27/20 0001      Observation/Other Assessments   Neck Disability Index  34%      AROM   Cervical Flexion 30    Cervical Extension 50    Cervical - Right Side Bend 24    Cervical - Left Side Bend  24    Cervical - Right Rotation 27    Cervical - Left Rotation 32      Strength   Overall Strength Comments right grip 20#; left 22# (fatigues quickly with multiple attempts)    Left Shoulder ABduction 4-/5    Left Shoulder External Rotation 4-/5                         [facility] Adult PT Treatment/Exercise - 08/27/20 0001      Shoulder Exercises: Standing   External Rotation Right;Left;10 reps    Theraband Level (Shoulder External Rotation) Level 3 (Green)    Extension Strengthening;Both;10 reps    Theraband Level (Shoulder Extension) Level 3 (Green)    Row Strengthening;Both;10 reps    Theraband Level (Shoulder Row) Level 3 (Green)      Moist Heat Therapy   Number Minutes Moist Heat 3 Minutes    Moist Heat Location Cervical      Manual Therapy   Soft tissue mobilization bil cervical paraspinals, bil upper traps, bil suboccipitals            Trigger Point Dry Needling - 08/27/20 0001    Consent Given? Yes    Muscles Treated Head and Neck Upper trapezius;Suboccipitals;Levator scapulae;Cervical multifidi    Dry Needling Comments bil    Upper Trapezius Response Twitch reponse elicited;Palpable increased muscle length    Suboccipitals Response Palpable increased muscle length    Levator Scapulae Response Twitch response elicited;Palpable increased muscle length    Cervical multifidi Response Palpable increased muscle length                PT Education - 08/27/20 1254    Education Details standing green band rows, extensions, external rotation (with handles)    Person(s) Educated Patient    Methods Explanation;Demonstration;Handout    Comprehension Returned demonstration;Verbalized understanding            PT Short Term Goals - 08/27/20 1306      PT SHORT TERM GOAL #1   Title Pt will be independent with her initial HEP to decrease pain and improve ability to be on her feet.    Status Achieved      PT SHORT TERM GOAL #2   Title Pt will report  atleast 30% decrease in her back and leg pain/numbness.    Status Achieved  PT Long Term Goals - 08/27/20 1307      PT LONG TERM GOAL #1   Title Pt will demonstrate PSLR to 70 degrees on RLE without increased pain due to improved neural and muscular flexibility for pain management    Time 12    Period Weeks    Status On-going      PT LONG TERM GOAL #2   Title Pt will report atleast 50% improvement in low back and buttock pain with daily activity.    Time 12    Period Weeks    Status Revised      PT LONG TERM GOAL #3   Title reduce Rt LE numbness and tingling by 50% with household tasks and care of son    Time 12    Period Weeks    Status On-going      PT LONG TERM GOAL #4   Title Pt will be independent with her advanced HEP and verbalize understanding of exercise progressions which will allow her to maintain progress after discharge from PT.     Time 12    Period Weeks    Status On-going      PT LONG TERM GOAL #5   Title The patient will have improved cervical sidebending to 35 degrees and rotation to 50 degrees needed for driving    Time 12    Period Weeks    Status New      Additional Long Term Goals   Additional Long Term Goals Yes      PT LONG TERM GOAL #6   Title Neck disability index improved to 28% or less    Time 12    Period Weeks    Status New      PT LONG TERM GOAL #7   Title Left grip improved to 30# of force and left shoulder abduction and external rotation to at least 4/5 to 4+/5    Time 12    Period Weeks    Status New                 Plan - 08/27/20 1232    Clinical Impression Statement The patient requests to focus on cervical region today.  (New order received from Dr. Windy Carina office for cervical region).  Limited ROM with cervical extension, bil sidebending and rotation.  Decreased soft tissue extensibility and tender points in bil cervical paraspinals, bil upper traps and suboccipitals.  Decreased grip strength on left  (dominant hand) and decreased left shoulder abduction and external rotation strength.  She has a positive response to DN and manual therapy.  Introduced resistive band ex's with handles secondary to history of hand pain/carpal tunnel surgery.  Will send new certification to add cervical region to plan of care.    Personal Factors and Comorbidities Age;Comorbidity 2;Time since onset of injury/illness/exacerbation    Comorbidities DM; CKD; carpal tunnel    Examination-Activity Limitations Caring for Others;Carry;Lift;Bend;Squat    Examination-Participation Restrictions Cleaning;Community Activity;Meal Prep    Stability/Clinical Decision Making Evolving/Moderate complexity    Rehab Potential Excellent    PT Frequency 1x / week    PT Duration 12 weeks    PT Treatment/Interventions ADLs/Self Care Home Management;Biofeedback;Cryotherapy;Electrical Stimulation;Moist Heat;Traction;Therapeutic activities;Therapeutic exercise;Neuromuscular re-education;Patient/family education;Manual techniques;Passive range of motion;Dry needling;Taping    PT Next Visit Plan assess response to DN of cervical region; review band ex's (handles) as needed.  Add shoulder abduction strengthening to HEP;  pt traveling to [place] soon    PT Home Exercise Plan Access  Code: [facility]           Patient will benefit from skilled therapeutic intervention in order to improve the following deficits and impairments:  Abnormal gait,Pain,Increased fascial restricitons,Decreased strength,Decreased activity tolerance,Decreased range of motion,Increased muscle spasms,Difficulty walking,Decreased endurance  Visit Diagnosis: Chronic bilateral low back pain, unspecified whether sciatica present - Plan: PT plan of care cert/re-cert  Muscle weakness (generalized) - Plan: PT plan of care cert/re-cert  Cervicalgia - Plan: PT plan of care cert/re-cert     Problem List Patient Active Problem List   Diagnosis Date Noted  . Closed  fracture of 9th & 10th ribs of right side 12/17/2016  . Pneumothorax on right 12/17/2016  . Lumbar degenerative disc disease 12/17/2016  . Essential hypertension 12/17/2016  . Dyslipidemia 10/07/2016  . Pulmonary nodule 10/07/2016  . Abnormal CXR   . Chest pain 10/06/2016  . S/P lumbar spinal fusion 08/05/2016  . Obstructive sleep apnea syndrome, mild 10/27/2013  . Controlled type 2 diabetes mellitus without complication, without long-term current use of insulin ([place]) 06/19/2011  . GAD (generalized anxiety disorder) 11/01/2009   Ruben Im, PT 08/27/20 1:13 PM Phone: [phone] Fax: [phone] Alvera Singh 08/27/2020, 1:13 PM  [facility] [address] [place], [place], [address] Phone: [phone]   Fax:  [phone]  Name: Krista Austin MRN: [id number] Date of Birth: Aug 08, 1945

## 2020-09-03 ENCOUNTER — Ambulatory Visit: Payer: Medicare Other | Attending: Neurosurgery | Admitting: Physical Therapy

## 2020-09-03 ENCOUNTER — Other Ambulatory Visit: Payer: Self-pay

## 2020-09-03 DIAGNOSIS — M545 Low back pain, unspecified: Secondary | ICD-10-CM | POA: Diagnosis not present

## 2020-09-03 DIAGNOSIS — M6281 Muscle weakness (generalized): Secondary | ICD-10-CM

## 2020-09-03 DIAGNOSIS — G8929 Other chronic pain: Secondary | ICD-10-CM | POA: Diagnosis present

## 2020-09-03 DIAGNOSIS — M542 Cervicalgia: Secondary | ICD-10-CM | POA: Diagnosis present

## 2020-09-03 NOTE — Therapy (Signed)
[facility] [address] [place], [place], [address] Phone: [phone]   Fax:  [phone]  Physical Therapy Treatment  Patient Details  Name: Krista Austin MRN: [id number] Date of Birth: 07/31/1945 Referring Provider (PT): Kary Kos, MD   Encounter Date: 09/03/2020   PT End of Session - 09/03/20 1350    Visit Number 8    Date for PT Re-Evaluation 11/19/20    Authorization Type medicare A/B    Progress Note Due on Visit 10    PT Start Time 6301    PT Stop Time 1220    PT Time Calculation (min) 35 min    Activity Tolerance Patient tolerated treatment well           Past Medical History:  Diagnosis Date  . Adenomatous polyp   . Arthritis    "mostly fingers" (10/07/2016)  . At high risk for falls   . 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 Chronic back pain    "worse in my neck; lower back also affected" (10/07/2016)  . Chronic cholecystitis 10/16/2016  . Complication of anesthesia    "never fully regained my taste after my cervical fusion; I don't take much RX so I'm very sensitive to any sedation" (10/07/2016)  . Concussion    X2  . DDD (degenerative disc disease), cervical    lumbar  . Depression    "hx; take Prozac to keep me stable" (10/07/2016)  . Dizziness    caused by compression of cervical disc  . Family history of adverse reaction to anesthesia    "daughters get PONV; one daughter breaks out severely; grandson got suspended in a state of anxiousness after kidney surgery; had to be held for hours"  . GERD (gastroesophageal reflux disease)   . Hyperlipidemia   . OSA on CPAP   . Pneumonia    "once as an adult" (10/07/2016)  . Thyroid nodule   . Type II diabetes mellitus ([place])    controlled by diet and exercide    Past Surgical History:  Procedure Laterality Date  . ANTERIOR CERVICAL DECOMP/DISCECTOMY FUSION  2015  . BACK SURGERY    . CARPAL  TUNNEL RELEASE Bilateral   . CATARACT EXTRACTION, BILATERAL Bilateral 09/10/2014 - 09/24/2014   by Darleen Crocker  . CHOLECYSTECTOMY N/A 10/16/2016   Procedure: LAPAROSCOPIC CHOLECYSTECTOMY;  Surgeon: [name], MD;  Location: [place];  Service: General;  Laterality: N/A;  . LAPAROSCOPIC CHOLECYSTECTOMY  10/16/2016  . [place] SURGERY  2007  . TONSILLECTOMY  05/05/1983  . TUBAL LIGATION  02/09/1979    There were no vitals filed for this visit.   Subjective Assessment - 09/03/20 1146    Subjective It was a miracle.  I'm elated.  My neck and even my back is so much better.  I have zero pain.  No numbness in foot even with walking.  The bending over would bother me.    Currently in Pain? No/denies    Pain Score 0-No pain                             [facility] Adult PT Treatment/Exercise - 09/03/20 0001      Therapeutic Activites    Therapeutic Activities Lifting;Other Therapeutic Activities;ADL's    Lifting hip hinge with golf club 3 points of contact 2x10; hip hinge 30 degrees without club 10x  Other Therapeutic Activities demo of hip hinge with squatting, sit to stand      Lumbar Exercises: Standing   Other Standing Lumbar Exercises review of standing extensions and band ex's per HEP      Lumbar Exercises: Prone   Other Prone Lumbar Exercises prop on forearms    Other Prone Lumbar Exercises press ups with hands under shoulders 8x                    PT Short Term Goals - 08/27/20 1306      PT SHORT TERM GOAL #1   Title Pt will be independent with her initial HEP to decrease pain and improve ability to be on her feet.    Status Achieved      PT SHORT TERM GOAL #2   Title Pt will report atleast 30% decrease in her back and leg pain/numbness.    Status Achieved             PT Long Term Goals - 08/27/20 1307      PT LONG TERM GOAL #1   Title Pt will demonstrate PSLR to 70 degrees on RLE without increased pain due to improved neural and  muscular flexibility for pain management    Time 12    Period Weeks    Status On-going      PT LONG TERM GOAL #2   Title Pt will report atleast 50% improvement in low back and buttock pain with daily activity.    Time 12    Period Weeks    Status Revised      PT LONG TERM GOAL #3   Title reduce Rt LE numbness and tingling by 50% with household tasks and care of son    Time 12    Period Weeks    Status On-going      PT LONG TERM GOAL #4   Title Pt will be independent with her advanced HEP and verbalize understanding of exercise progressions which will allow her to maintain progress after discharge from PT.     Time 12    Period Weeks    Status On-going      PT LONG TERM GOAL #5   Title The patient will have improved cervical sidebending to 35 degrees and rotation to 50 degrees needed for driving    Time 12    Period Weeks    Status New      Additional Long Term Goals   Additional Long Term Goals Yes      PT LONG TERM GOAL #6   Title Neck disability index improved to 28% or less    Time 12    Period Weeks    Status New      PT LONG TERM GOAL #7   Title Left grip improved to 30# of force and left shoulder abduction and external rotation to at least 4/5 to 4+/5    Time 12    Period Weeks    Status New                 Plan - 09/03/20 1350    Clinical Impression Statement The patient reports excellent results from DN of cervical musculature last visit and reports it's been years since she's been painfree in this region.  Her back and LE symptoms are better as well.  Initiated instruction in hip hinge method to alleviate LBP with lifting the laundry basket and other household chores.   She will practice keeping  3 points of contact using a golf club for home.  She is pleased with how she's feeling and states she may cancel her next appt if she's still doing well.    Personal Factors and Comorbidities Age;Comorbidity 2;Time since onset of injury/illness/exacerbation     Comorbidities DM; CKD; carpal tunnel    Examination-Activity Limitations Caring for Others;Carry;Lift;Bend;Squat    Rehab Potential Excellent    PT Frequency 1x / week    PT Duration 12 weeks    PT Treatment/Interventions ADLs/Self Care Home Management;Biofeedback;Cryotherapy;Electrical Stimulation;Moist Heat;Traction;Therapeutic activities;Therapeutic exercise;Neuromuscular re-education;Patient/family education;Manual techniques;Passive range of motion;Dry needling;Taping    PT Next Visit Plan Pt states if she's doing this well next week, she may cancel and follow up after her trip to [place];  DN cervical as needed;  review hip hinge; review prone press ups as needed    PT Home Exercise Plan Access Code: [facility]           Patient will benefit from skilled therapeutic intervention in order to improve the following deficits and impairments:  Abnormal gait,Pain,Increased fascial restricitons,Decreased strength,Decreased activity tolerance,Decreased range of motion,Increased muscle spasms,Difficulty walking,Decreased endurance  Visit Diagnosis: Chronic bilateral low back pain, unspecified whether sciatica present  Muscle weakness (generalized)  Cervicalgia     Problem List Patient Active Problem List   Diagnosis Date Noted  . Closed fracture of 9th & 10th ribs of right side 12/17/2016  . Pneumothorax on right 12/17/2016  . Lumbar degenerative disc disease 12/17/2016  . Essential hypertension 12/17/2016  . Dyslipidemia 10/07/2016  . Pulmonary nodule 10/07/2016  . Abnormal CXR   . Chest pain 10/06/2016  . S/P lumbar spinal fusion 08/05/2016  . Obstructive sleep apnea syndrome, mild 10/27/2013  . Controlled type 2 diabetes mellitus without complication, without long-term current use of insulin ([place]) 06/19/2011  . GAD (generalized anxiety disorder) 11/01/2009   Ruben Im, PT 09/03/20 5:51 PM Phone: [phone] Fax: [phone] Alvera Singh 09/03/2020, 5:50 PM  [facility] [address], [place], [place], [address] Phone: [phone]   Fax:  [phone]  Name: Krista Austin MRN: [id number] Date of Birth: 1945-10-30

## 2020-09-12 ENCOUNTER — Ambulatory Visit: Payer: Medicare Other | Admitting: Physical Therapy

## 2020-10-03 ENCOUNTER — Ambulatory Visit: Payer: Medicare Other | Attending: Neurosurgery | Admitting: Physical Therapy

## 2020-10-03 ENCOUNTER — Other Ambulatory Visit: Payer: Self-pay

## 2020-10-03 DIAGNOSIS — M6281 Muscle weakness (generalized): Secondary | ICD-10-CM | POA: Insufficient documentation

## 2020-10-03 DIAGNOSIS — G8929 Other chronic pain: Secondary | ICD-10-CM | POA: Diagnosis present

## 2020-10-03 DIAGNOSIS — M545 Low back pain, unspecified: Secondary | ICD-10-CM | POA: Diagnosis not present

## 2020-10-03 DIAGNOSIS — M542 Cervicalgia: Secondary | ICD-10-CM | POA: Diagnosis present

## 2020-10-03 NOTE — Therapy (Signed)
[facility] [address] [place], [place], [address] Phone: [phone]   Fax:  [phone]  Physical Therapy Treatment  Patient Details  Name: Krista Austin MRN: [id number] Date of Birth: 04/21/1946 Referring Provider (PT): Kary Kos, MD   Encounter Date: 10/03/2020   PT End of Session - 10/03/20 0929    Visit Number 9    Date for PT Re-Evaluation 11/19/20    Authorization Type medicare A/B    Progress Note Due on Visit 10    PT Start Time 0845    PT Stop Time 0935    PT Time Calculation (min) 50 min    Activity Tolerance Patient tolerated treatment well           Past Medical History:  Diagnosis Date  . Adenomatous polyp   . Arthritis    "mostly fingers" (10/07/2016)  . At high risk for falls   . 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 Chronic back pain    "worse in my neck; lower back also affected" (10/07/2016)  . Chronic cholecystitis 10/16/2016  . Complication of anesthesia    "never fully regained my taste after my cervical fusion; I don't take much RX so I'm very sensitive to any sedation" (10/07/2016)  . Concussion    X2  . DDD (degenerative disc disease), cervical    lumbar  . Depression    "hx; take Prozac to keep me stable" (10/07/2016)  . Dizziness    caused by compression of cervical disc  . Family history of adverse reaction to anesthesia    "daughters get PONV; one daughter breaks out severely; grandson got suspended in a state of anxiousness after kidney surgery; had to be held for hours"  . GERD (gastroesophageal reflux disease)   . Hyperlipidemia   . OSA on CPAP   . Pneumonia    "once as an adult" (10/07/2016)  . Thyroid nodule   . Type II diabetes mellitus ([place])    controlled by diet and exercide    Past Surgical History:  Procedure Laterality Date  . ANTERIOR CERVICAL DECOMP/DISCECTOMY FUSION  2015  . BACK SURGERY    . CARPAL  TUNNEL RELEASE Bilateral   . CATARACT EXTRACTION, BILATERAL Bilateral 09/10/2014 - 09/24/2014   by Darleen Crocker  . CHOLECYSTECTOMY N/A 10/16/2016   Procedure: LAPAROSCOPIC CHOLECYSTECTOMY;  Surgeon: [name], MD;  Location: [facility];  Service: General;  Laterality: N/A;  . LAPAROSCOPIC CHOLECYSTECTOMY  10/16/2016  . [place] SURGERY  2007  . TONSILLECTOMY  05/05/1983  . TUBAL LIGATION  02/09/1979    There were no vitals filed for this visit.   Subjective Assessment - 10/03/20 0849    Subjective Had a flat tire at the start of trip to [place] and did a lot of unloading/loading of the car.  That really upset my low back and left sided migraines.   Bilateral buttock pain and right LE symptoms.  We came back early.    Pertinent History DM, stage III kidney; osteoporosis (mild)    Currently in Pain? Yes    Pain Score 5     Pain Location Neck    Pain Orientation Left;Right    Pain Radiating Towards Right LE numbness    Multiple Pain Sites Yes    Pain Location Back    Pain Type Chronic pain    Pain Radiating Towards bil buttocks  [facility] [facility] Adult PT Treatment/Exercise - 10/03/20 0001      Moist Heat Therapy   Number Minutes Moist Heat 5 Minutes    Moist Heat Location Cervical;Lumbar Spine;Hip      Electrical Stimulation   Electrical Stimulation Location bil lumbar multifidi    Electrical Stimulation Action pre mod    Electrical Stimulation Parameters with DN 8 min 1.5 v    Electrical Stimulation Goals Pain      Manual Therapy   Soft tissue mobilization bil lumbar paraspinals,gluteals and piriformis; upper traps, cervical paraspinals, suboccipitals    Other Manual Therapy suboccipital release 3 min; lumbar neutral gapping 30secx3            Trigger Point Dry Needling - 10/03/20 0001    Consent Given? Yes    Muscles Treated Head and Neck Upper trapezius;Suboccipitals;Levator scapulae;Cervical multifidi    Dry Needling Comments  bil   right hip muscles only   Electrical Stimulation Performed with Dry Needling Yes    Upper Trapezius Response Twitch reponse elicited;Palpable increased muscle length    Suboccipitals Response Palpable increased muscle length    Levator Scapulae Response Twitch response elicited;Palpable increased muscle length    Cervical multifidi Response Palpable increased muscle length    Gluteus Minimus Response Palpable increased muscle length;Twitch response elicited    Gluteus Medius Response Palpable increased muscle length;Twitch response elicited    Gluteus Maximus Response Twitch response elicited;Palpable increased muscle length    Piriformis Response Twitch response elicited;Palpable increased muscle length    Lumbar multifidi Response Palpable increased muscle length   bil                 PT Short Term Goals - 08/27/20 1306      PT SHORT TERM GOAL #1   Title Pt will be independent with her initial HEP to decrease pain and improve ability to be on her feet.    Status Achieved      PT SHORT TERM GOAL #2   Title Pt will report atleast 30% decrease in her back and leg pain/numbness.    Status Achieved             PT Long Term Goals - 08/27/20 1307      PT LONG TERM GOAL #1   Title Pt will demonstrate PSLR to 70 degrees on RLE without increased pain due to improved neural and muscular flexibility for pain management    Time 12    Period Weeks    Status On-going      PT LONG TERM GOAL #2   Title Pt will report atleast 50% improvement in low back and buttock pain with daily activity.    Time 12    Period Weeks    Status Revised      PT LONG TERM GOAL #3   Title reduce Rt LE numbness and tingling by 50% with household tasks and care of son    Time 12    Period Weeks    Status On-going      PT LONG TERM GOAL #4   Title Pt will be independent with her advanced HEP and verbalize understanding of exercise progressions which will allow her to maintain progress after  discharge from PT.     Time 12    Period Weeks    Status On-going      PT LONG TERM GOAL #5   Title The patient will have improved cervical sidebending to 35 degrees and rotation to 50 degrees needed  for driving    Time 12    Period Weeks    Status New      Additional Long Term Goals   Additional Long Term Goals Yes      PT LONG TERM GOAL #6   Title Neck disability index improved to 28% or less    Time 12    Period Weeks    Status New      PT LONG TERM GOAL #7   Title Left grip improved to 30# of force and left shoulder abduction and external rotation to at least 4/5 to 4+/5    Time 12    Period Weeks    Status New                 Plan - 10/03/20 0946    Clinical Impression Statement The patient had exacerbation of neck/headache and lumbar/right LE symptoms after dealing with a flat tire/unloading/reloading the car while traveling.  She reports excellent relief of previous symptoms with DN and manual therapy and requests to do this today.  Noted significant shortened tissue in suboccipitals and tender points in upper traps and gluteal and piriformis muscles.  Much improved soft tissue mobility noted and relief of  symptoms at the conclusion of treatment session.  Therapist monitoring response during all interventions.    Personal Factors and Comorbidities Age;Comorbidity 2;Time since onset of injury/illness/exacerbation    Comorbidities DM; CKD; carpal tunnel    Examination-Activity Limitations Caring for Others;Carry;Lift;Bend;Squat    Examination-Participation Restrictions Cleaning;Community Activity;Meal Prep    Rehab Potential Excellent    PT Frequency 1x / week    PT Duration 12 weeks    PT Treatment/Interventions ADLs/Self Care Home Management;Biofeedback;Cryotherapy;Electrical Stimulation;Moist Heat;Traction;Therapeutic activities;Therapeutic exercise;Neuromuscular re-education;Patient/family education;Manual techniques;Passive range of motion;Dry needling;Taping     PT Next Visit Plan 10th visit progress note; Neck Disability Index; check grip strength; cervical ROM;  DN cervical, lumbar and hip musculature with ES;  manual therapy    PT Home Exercise Plan Access Code: [facility]           Patient will benefit from skilled therapeutic intervention in order to improve the following deficits and impairments:  Abnormal gait,Pain,Increased fascial restricitons,Decreased strength,Decreased activity tolerance,Decreased range of motion,Increased muscle spasms,Difficulty walking,Decreased endurance  Visit Diagnosis: Chronic bilateral low back pain, unspecified whether sciatica present  Muscle weakness (generalized)  Cervicalgia     Problem List Patient Active Problem List   Diagnosis Date Noted  . Closed fracture of 9th & 10th ribs of right side 12/17/2016  . Pneumothorax on right 12/17/2016  . Lumbar degenerative disc disease 12/17/2016  . Essential hypertension 12/17/2016  . Dyslipidemia 10/07/2016  . Pulmonary nodule 10/07/2016  . Abnormal CXR   . Chest pain 10/06/2016  . S/P lumbar spinal fusion 08/05/2016  . Obstructive sleep apnea syndrome, mild 10/27/2013  . Controlled type 2 diabetes mellitus without complication, without long-term current use of insulin ([place]) 06/19/2011  . GAD (generalized anxiety disorder) 11/01/2009   Ruben Im, PT 10/03/20 9:55 AM Phone: [phone] Fax: [phone] Alvera Singh 10/03/2020, 9:54 AM  [facility] [address] [place], [place], [address] Phone: [phone]   Fax:  [phone]  Name: WINSTON SOBCZYK MRN: [id number] Date of Birth: [birth year]

## 2020-10-18 ENCOUNTER — Ambulatory Visit: Payer: Medicare Other | Admitting: Physical Therapy

## 2020-10-18 ENCOUNTER — Other Ambulatory Visit: Payer: Self-pay

## 2020-10-18 DIAGNOSIS — M545 Low back pain, unspecified: Secondary | ICD-10-CM

## 2020-10-18 DIAGNOSIS — G8929 Other chronic pain: Secondary | ICD-10-CM

## 2020-10-18 DIAGNOSIS — M6281 Muscle weakness (generalized): Secondary | ICD-10-CM

## 2020-10-18 DIAGNOSIS — M542 Cervicalgia: Secondary | ICD-10-CM

## 2020-10-18 NOTE — Therapy (Signed)
[facility] [address], [place], [place], [address] Phone: [phone]   Fax:  [phone]  Physical Therapy Evaluation  Patient Details  Name: Krista Austin MRN: [id number] Date of Birth: 1945/07/09 Referring Provider (PT): Kary Kos, MD  Progress Note Reporting Period 05/02/20 to 10/18/2020   See note below for Objective Data and Assessment of Progress/Goals.     Encounter Date: 10/18/2020   PT End of Session - 10/18/20 1052     Visit Number 10    Date for PT Re-Evaluation 11/19/20    Authorization Type medicare A/B    Progress Note Due on Visit 10    PT Start Time 1017    PT Stop Time 1105    PT Time Calculation (min) 48 min    Activity Tolerance Patient tolerated treatment well             Past Medical History:  Diagnosis Date   Adenomatous polyp    Arthritis    "mostly fingers" (10/07/2016)   At high risk for falls    Celiac disease    "I do not have the disease; 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" (10/07/2016)   Chronic back pain    "worse in my neck; lower back also affected" (10/07/2016)   Chronic cholecystitis 6/38/7564   Complication of anesthesia    "never fully regained my taste after my cervical fusion; I don't take much RX so I'm very sensitive to any sedation" (10/07/2016)   Concussion    X2   DDD (degenerative disc disease), cervical    lumbar   Depression    "hx; take Prozac to keep me stable" (10/07/2016)   Dizziness    caused by compression of cervical disc   Family history of adverse reaction to anesthesia    "daughters get PONV; one daughter breaks out severely; grandson got suspended in a state of anxiousness after kidney surgery; had to be held for hours"   GERD (gastroesophageal reflux disease)    Hyperlipidemia    OSA on CPAP    Pneumonia    "once as an adult" (10/07/2016)   Thyroid nodule    Type II diabetes mellitus ([place])    controlled by diet and exercide    Past Surgical  History:  Procedure Laterality Date   ANTERIOR CERVICAL DECOMP/DISCECTOMY FUSION  2015   BACK SURGERY     CARPAL TUNNEL RELEASE Bilateral    CATARACT EXTRACTION, BILATERAL Bilateral 09/10/2014 - 09/24/2014   by [place] N/A 10/16/2016   Procedure: LAPAROSCOPIC CHOLECYSTECTOMY;  Surgeon: [name], MD;  Location: [facility];  Service: General;  Laterality: N/A;   LAPAROSCOPIC CHOLECYSTECTOMY  10/16/2016   LUMBAR [place] SURGERY  2007   TONSILLECTOMY  05/05/1983   TUBAL LIGATION  02/09/1979    There were no vitals filed for this visit.    Subjective Assessment - 10/18/20 1018     Subjective I had 2 so-so days after DN and then my low back started bothering me badly.  I searched the internet and I realized I had saddle paresthesia.  It eased off a bit.  I have horrendous pain all the way down the right leg.  Sharp pain in right low back varies but constant.  This is different than I've had before.  Numb right leg to heel intermittently. Produced with standing and with walking laps.  Better with lying down.   Straining with bowel movement worsens.   I still do my ex's  every other day.  TENS helps to ease it off some.    Pertinent History DM, stage III kidney; osteoporosis (mild)    Patient Stated Goals be able walk 1 mile; do 2-3 hours of housework;do not want to come to clinic a lot because of high risk with Covid                [facility] PT Assessment - 10/18/20 0001       Observation/Other Assessments   Neck Disability Index  28%                        Objective measurements completed on examination: See above findings.       [facility] Adult PT Treatment/Exercise - 10/18/20 0001       Moist Heat Therapy   Number Minutes Moist Heat 5 Minutes    Moist Heat Location Lumbar Spine   prone     Electrical Stimulation   Electrical Stimulation Location bil lumbar multifidi    Electrical Stimulation Action pre mod    Electrical Stimulation Parameters  with DN10 min 1.5 V    Electrical Stimulation Goals Pain      Manual Therapy   Soft tissue mobilization bil lumbar paraspinals,gluteals and piriformis; QL    Other Manual Therapy lumbar neutral gapping              Trigger Point Dry Needling - 10/18/20 0001     Consent Given? Yes    Dry Needling Comments bil   right hip muscles only   Electrical Stimulation Performed with Dry Needling Yes    Gluteus Minimus Response Twitch response elicited;Palpable increased muscle length    Gluteus Medius Response Twitch response elicited;Palpable increased muscle length    Gluteus Maximus Response Twitch response elicited;Palpable increased muscle length    Piriformis Response Twitch response elicited;Palpable increased muscle length    Lumbar multifidi Response Twitch response elicited;Palpable increased muscle length                    PT Short Term Goals - 08/27/20 1306       PT SHORT TERM GOAL #1   Title Pt will be independent with her initial HEP to decrease pain and improve ability to be on her feet.    Status Achieved      PT SHORT TERM GOAL #2   Title Pt will report atleast 30% decrease in her back and leg pain/numbness.    Status Achieved               PT Long Term Goals - 08/27/20 1307       PT LONG TERM GOAL #1   Title Pt will demonstrate PSLR to 70 degrees on RLE without increased pain due to improved neural and muscular flexibility for pain management    Time 12    Period Weeks    Status On-going      PT LONG TERM GOAL #2   Title Pt will report atleast 50% improvement in low back and buttock pain with daily activity.    Time 12    Period Weeks    Status Revised      PT LONG TERM GOAL #3   Title reduce Rt LE numbness and tingling by 50% with household tasks and care of son    Time 12    Period Weeks    Status On-going      PT LONG TERM GOAL #4  Title Pt will be independent with her advanced HEP and verbalize understanding of exercise  progressions which will allow her to maintain progress after discharge from PT.     Time 12    Period Weeks    Status On-going      PT LONG TERM GOAL #5   Title The patient will have improved cervical sidebending to 35 degrees and rotation to 50 degrees needed for driving    Time 12    Period Weeks    Status New      Additional Long Term Goals   Additional Long Term Goals Yes      PT LONG TERM GOAL #6   Title Neck disability index improved to 28% or less    Time 12    Period Weeks    Status New      PT LONG TERM GOAL #7   Title Left grip improved to 30# of force and left shoulder abduction and external rotation to at least 4/5 to 4+/5    Time 12    Period Weeks    Status New                    Plan - 10/18/20 1053     Clinical Impression Statement The patient reports a change in status with paresthesia in the saddle region and right LE intermittent numbness worsened with standing and walking, relieved with lying down. Recommend she discuss these changes with Dr. Saintclair Halsted.  She states she had some short term relief with DN /ES and wishes to proceed with this for the lumbo/pelvic and hip musculature.  No new goals met pertinent to the lumbar region but improvements in the cervical region/headache noted.  Neck Disability Index improved from 34% to 28%.  She would benefit from continued PT for pain control and return to function.    Personal Factors and Comorbidities Age;Comorbidity 2;Time since onset of injury/illness/exacerbation    Examination-Activity Limitations Caring for Others;Carry;Lift;Bend;Squat    Rehab Potential Excellent    PT Frequency 1x / week    PT Duration 12 weeks    PT Treatment/Interventions ADLs/Self Care Home Management;Biofeedback;Cryotherapy;Electrical Stimulation;Moist Heat;Traction;Therapeutic activities;Therapeutic exercise;Neuromuscular re-education;Patient/family education;Manual techniques;Passive range of motion;Dry needling;Taping    PT Next  Visit Plan follow up on parathesia symptoms;  check grip strength, check cervical ROM, FOTO for low back    PT Home Exercise Plan Access Code: [facility]             Patient will benefit from skilled therapeutic intervention in order to improve the following deficits and impairments:  Abnormal gait, Pain, Increased fascial restricitons, Decreased strength, Decreased activity tolerance, Decreased range of motion, Increased muscle spasms, Difficulty walking, Decreased endurance  Visit Diagnosis: Chronic bilateral low back pain, unspecified whether sciatica present  Muscle weakness (generalized)  Cervicalgia     Problem List Patient Active Problem List   Diagnosis Date Noted   Closed fracture of 9th & 10th ribs of right side 12/17/2016   Pneumothorax on right 12/17/2016   Lumbar degenerative disc disease 12/17/2016   Essential hypertension 12/17/2016   Dyslipidemia 10/07/2016   Pulmonary nodule 10/07/2016   Abnormal CXR    Chest pain 10/06/2016   S/P lumbar spinal fusion 08/05/2016   Obstructive sleep apnea syndrome, mild 10/27/2013   Controlled type 2 diabetes mellitus without complication, without long-term current use of insulin ([place]) 06/19/2011   GAD (generalized anxiety disorder) 11/01/2009   Ruben Im, PT 10/18/20 12:13 PM Phone: [phone] Fax: [phone]  Alvera Singh 10/18/2020, 12:13 PM  [facility] [address], [place], [place], [address] Phone: [phone]   Fax:  [phone]  Name: MYLAN LENGYEL MRN: [id number] Date of Birth: 10-22-1945

## 2020-10-22 ENCOUNTER — Ambulatory Visit
Admission: RE | Admit: 2020-10-22 | Discharge: 2020-10-22 | Disposition: A | Discharge status: Home / Self Care | Payer: Medicare Other | Source: Ambulatory Visit | Attending: Physician Assistant | Admitting: Physician Assistant

## 2020-10-22 ENCOUNTER — Other Ambulatory Visit: Payer: Self-pay

## 2020-10-22 DIAGNOSIS — Z1231 Encounter for screening mammogram for malignant neoplasm of breast: Secondary | ICD-10-CM

## 2020-10-24 ENCOUNTER — Telehealth: Payer: Self-pay

## 2020-10-24 NOTE — Telephone Encounter (Signed)
Referral notes sent from [name], [place] office, Phone #: [phone], Fax #: [phone]   Notes sent to scheduling

## 2020-10-25 ENCOUNTER — Ambulatory Visit: Payer: Medicare Other | Admitting: Physical Therapy

## 2020-10-25 ENCOUNTER — Other Ambulatory Visit: Payer: Self-pay

## 2020-10-25 DIAGNOSIS — M545 Low back pain, unspecified: Secondary | ICD-10-CM | POA: Diagnosis not present

## 2020-10-25 DIAGNOSIS — M542 Cervicalgia: Secondary | ICD-10-CM

## 2020-10-25 DIAGNOSIS — M6281 Muscle weakness (generalized): Secondary | ICD-10-CM

## 2020-10-25 DIAGNOSIS — G8929 Other chronic pain: Secondary | ICD-10-CM

## 2020-10-25 NOTE — Therapy (Signed)
[facility] [address], [place], [place], [address] Phone: [phone]   Fax:  [phone]  Physical Therapy Treatment  Patient Details  Name: Krista Austin MRN: [id number] Date of Birth: 1945-05-26 Referring Provider (PT): Kary Kos, MD   Encounter Date: 10/25/2020   PT End of Session - 10/25/20 1234     Visit Number 11    Date for PT Re-Evaluation 11/19/20    Authorization Type medicare A/B    Progress Note Due on Visit 20    PT Start Time 5573    PT Stop Time 2202    PT Time Calculation (min) 50 min    Activity Tolerance Patient tolerated treatment well             Past Medical History:  Diagnosis Date   Adenomatous polyp    Arthritis    "mostly fingers" (10/07/2016)   At high risk for falls    Celiac disease    "I do not have the disease; 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" (10/07/2016)   Chronic back pain    "worse in my neck; lower back also affected" (10/07/2016)   Chronic cholecystitis 5/42/7062   Complication of anesthesia    "never fully regained my taste after my cervical fusion; I don't take much RX so I'm very sensitive to any sedation" (10/07/2016)   Concussion    X2   DDD (degenerative disc disease), cervical    lumbar   Depression    "hx; take Prozac to keep me stable" (10/07/2016)   Dizziness    caused by compression of cervical disc   Family history of adverse reaction to anesthesia    "daughters get PONV; one daughter breaks out severely; grandson got suspended in a state of anxiousness after kidney surgery; had to be held for hours"   GERD (gastroesophageal reflux disease)    Hyperlipidemia    OSA on CPAP    Pneumonia    "once as an adult" (10/07/2016)   Thyroid nodule    Type II diabetes mellitus ([place])    controlled by diet and exercide    Past Surgical History:  Procedure Laterality Date   ANTERIOR CERVICAL DECOMP/DISCECTOMY FUSION  2015   BACK SURGERY     CARPAL TUNNEL RELEASE  Bilateral    CATARACT EXTRACTION, BILATERAL Bilateral 09/10/2014 - 09/24/2014   by [place] N/A 10/16/2016   Procedure: LAPAROSCOPIC CHOLECYSTECTOMY;  Surgeon: [name], MD;  Location: [facility];  Service: General;  Laterality: N/A;   LAPAROSCOPIC CHOLECYSTECTOMY  10/16/2016   LUMBAR [place] SURGERY  2007   TONSILLECTOMY  05/05/1983   TUBAL LIGATION  02/09/1979    There were no vitals filed for this visit.   Subjective Assessment - 10/25/20 1015     Subjective Saw PCP who will handle meds and future PT appts.  I called Dr. Windy Carina office regarding saddle paresthesia.  Saw his PA yesterday and she felt it was helpful to determine "red flags".  Leg is improving some but it's come and go.  My neck is getting tight again.    Diagnostic tests MRI shows disc bulging    Patient Stated Goals be able walk 1 mile; do 2-3 hours of housework;do not want to come to clinic a lot because of high risk with Covid    Currently in Pain? Yes    Pain Score 1     Pain Location Buttocks    Pain Orientation Right    Pain  Radiating Towards to heel    Pain Score 2    Pain Location Neck    Pain Orientation Left                               [facility] Adult PT Treatment/Exercise - 10/25/20 0001       Moist Heat Therapy   Number Minutes Moist Heat 5 Minutes    Moist Heat Location Cervical;Lumbar Spine      Electrical Stimulation   Electrical Stimulation Location bil lumbar multifidi    Electrical Stimulation Action pre mod    Electrical Stimulation Parameters with DN 10 min    Electrical Stimulation Goals Pain      Manual Therapy   Soft tissue mobilization bil lumbar paraspinals,gluteals and piriformis; bil cervical paraspinals, upper traps, suboccipitals              Trigger Point Dry Needling - 10/25/20 0001     Consent Given? Yes    Muscles Treated Head and Neck Upper trapezius;Suboccipitals;Levator scapulae;Cervical multifidi    Dry Needling Comments  bil   right hip muscles only   Electrical Stimulation Performed with Dry Needling Yes    Upper Trapezius Response Twitch reponse elicited;Palpable increased muscle length    Suboccipitals Response Palpable increased muscle length    Levator Scapulae Response Twitch response elicited;Palpable increased muscle length    Cervical multifidi Response Palpable increased muscle length    Gluteus Minimus Response Palpable increased muscle length;Twitch response elicited    Gluteus Medius Response Palpable increased muscle length;Twitch response elicited    Gluteus Maximus Response Twitch response elicited;Palpable increased muscle length    Piriformis Response Twitch response elicited;Palpable increased muscle length    Lumbar multifidi Response Palpable increased muscle length   bil                   PT Short Term Goals - 08/27/20 1306       PT SHORT TERM GOAL #1   Title Pt will be independent with her initial HEP to decrease pain and improve ability to be on her feet.    Status Achieved      PT SHORT TERM GOAL #2   Title Pt will report atleast 30% decrease in her back and leg pain/numbness.    Status Achieved               PT Long Term Goals - 10/25/20 1241       PT LONG TERM GOAL #1   Title Pt will demonstrate PSLR to 70 degrees on RLE without increased pain due to improved neural and muscular flexibility for pain management    Time 12    Period Weeks    Status On-going    Target Date 11/19/20      PT LONG TERM GOAL #2   Title Pt will report atleast 50% improvement in low back and buttock pain with daily activity.    Time 12    Period Weeks    Status On-going      PT LONG TERM GOAL #3   Title reduce Rt LE numbness and tingling by 50% with household tasks and care of son    Time 12    Period Weeks    Status On-going      PT LONG TERM GOAL #4   Title Pt will be independent with her advanced HEP and verbalize understanding of exercise progressions which will  allow  her to maintain progress after discharge from PT.     Time 12    Period Weeks    Status On-going      PT LONG TERM GOAL #5   Title The patient will have improved cervical sidebending to 35 degrees and rotation to 50 degrees needed for driving    Time 12    Period Weeks    Status On-going      PT LONG TERM GOAL #6   Title Neck disability index improved to 28% or less    Time 12    Period Weeks    Status On-going      PT LONG TERM GOAL #7   Title Left grip improved to 30# of force and left shoulder abduction and external rotation to at least 4/5 to 4+/5    Time 12    Period Weeks    Status On-going                   Plan - 10/25/20 1235     Clinical Impression Statement Although symptoms still present in LE, pain intensity decreased.  Neck pain is present but low in intensity as well.  No recent headaches.  Multiple tender points present in glute and piriformis but much improved soft tissue mobility noted after DN and manual therapy.  Good response as well to DN and manual therapy to cervical musculature with decreased tender point size and number compared to previous visits.  Therapist monitoring response with all treatment interventions.    Personal Factors and Comorbidities Age;Comorbidity 2;Time since onset of injury/illness/exacerbation    Comorbidities DM; CKD; carpal tunnel    Rehab Potential Excellent    PT Frequency 1x / week    PT Duration 12 weeks    PT Treatment/Interventions ADLs/Self Care Home Management;Biofeedback;Cryotherapy;Electrical Stimulation;Moist Heat;Traction;Therapeutic activities;Therapeutic exercise;Neuromuscular re-education;Patient/family education;Manual techniques;Passive range of motion;Dry needling;Taping    PT Next Visit Plan DN/ES; manual therapy;  check grip strength, check cervical ROM, FOTO for low back    PT Home Exercise Plan Access Code: [facility]             Patient will benefit from skilled therapeutic intervention in  order to improve the following deficits and impairments:  Abnormal gait, Pain, Increased fascial restricitons, Decreased strength, Decreased activity tolerance, Decreased range of motion, Increased muscle spasms, Difficulty walking, Decreased endurance  Visit Diagnosis: Chronic bilateral low back pain, unspecified whether sciatica present  Muscle weakness (generalized)  Cervicalgia     Problem List Patient Active Problem List   Diagnosis Date Noted   Closed fracture of 9th & 10th ribs of right side 12/17/2016   Pneumothorax on right 12/17/2016   Lumbar degenerative disc disease 12/17/2016   Essential hypertension 12/17/2016   Dyslipidemia 10/07/2016   Pulmonary nodule 10/07/2016   Abnormal CXR    Chest pain 10/06/2016   S/P lumbar spinal fusion 08/05/2016   Obstructive sleep apnea syndrome, mild 10/27/2013   Controlled type 2 diabetes mellitus without complication, without long-term current use of insulin ([place]) 06/19/2011   GAD (generalized anxiety disorder) 11/01/2009   Ruben Im, PT 10/25/20 12:42 PM Phone: [phone] Fax: [phone]  Alvera Singh 10/25/2020, 12:42 PM  [facility] [address] [place], [place], [address] Phone: [phone]   Fax:  [phone]  Name: ELLANORA RAYBORN MRN: [id number] Date of Birth: [birth year]

## 2020-11-05 ENCOUNTER — Other Ambulatory Visit: Payer: Self-pay

## 2020-11-05 ENCOUNTER — Ambulatory Visit: Payer: Medicare Other | Attending: Neurosurgery | Admitting: Physical Therapy

## 2020-11-05 DIAGNOSIS — M545 Low back pain, unspecified: Secondary | ICD-10-CM | POA: Diagnosis present

## 2020-11-05 DIAGNOSIS — M6281 Muscle weakness (generalized): Secondary | ICD-10-CM | POA: Insufficient documentation

## 2020-11-05 DIAGNOSIS — M542 Cervicalgia: Secondary | ICD-10-CM | POA: Insufficient documentation

## 2020-11-05 DIAGNOSIS — G8929 Other chronic pain: Secondary | ICD-10-CM | POA: Diagnosis present

## 2020-11-05 NOTE — Therapy (Addendum)
[facility] [address], [place], [place], [address] Phone: [phone]   Fax:  [phone]  Physical Therapy Treatment/Discharge Summary   Patient Details  Name: Krista Austin MRN: [id number] Date of Birth: 01-Jul-1945 Referring Provider (PT): Kary Kos, MD   Encounter Date: 11/05/2020   PT End of Session - 11/05/20 1903     Visit Number 12    Date for PT Re-Evaluation 11/19/20    Authorization Type medicare A/B    Progress Note Due on Visit 20    PT Start Time 1020    PT Stop Time 5329    PT Time Calculation (min) 45 min    Activity Tolerance Patient tolerated treatment well             Past Medical History:  Diagnosis Date   Adenomatous polyp    Arthritis    "mostly fingers" (10/07/2016)   At high risk for falls    Celiac disease    "I do not have the disease; 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" (10/07/2016)   Chronic back pain    "worse in my neck; lower back also affected" (10/07/2016)   Chronic cholecystitis 01/25/2682   Complication of anesthesia    "never fully regained my taste after my cervical fusion; I don't take much RX so I'm very sensitive to any sedation" (10/07/2016)   Concussion    X2   DDD (degenerative disc disease), cervical    lumbar   Depression    "hx; take Prozac to keep me stable" (10/07/2016)   Dizziness    caused by compression of cervical disc   Family history of adverse reaction to anesthesia    "daughters get PONV; one daughter breaks out severely; grandson got suspended in a state of anxiousness after kidney surgery; had to be held for hours"   GERD (gastroesophageal reflux disease)    Hyperlipidemia    OSA on CPAP    Pneumonia    "once as an adult" (10/07/2016)   Thyroid nodule    Type II diabetes mellitus ([place])    controlled by diet and exercide    Past Surgical History:  Procedure Laterality Date   ANTERIOR CERVICAL DECOMP/DISCECTOMY FUSION  2015   BACK SURGERY     CARPAL  TUNNEL RELEASE Bilateral    CATARACT EXTRACTION, BILATERAL Bilateral 09/10/2014 - 09/24/2014   by [place] N/A 10/16/2016   Procedure: LAPAROSCOPIC CHOLECYSTECTOMY;  Surgeon: [name], MD;  Location: [facility];  Service: General;  Laterality: N/A;   LAPAROSCOPIC CHOLECYSTECTOMY  10/16/2016   LUMBAR [place] SURGERY  2007   TONSILLECTOMY  05/05/1983   TUBAL LIGATION  02/09/1979    There were no vitals filed for this visit.   Subjective Assessment - 11/05/20 1024     Subjective Pretty good.  Sciatic nerve aggravated with 5 min in the car.  I've tried multiple cushions but it doesn't help.   I can walk almost a mile.  Not a lot of numbness in my leg.  Piriformis stretch will aggravate it so I've stopped it.  The DN seems to help.   Going to [place]  to fly this weekend.  Right neck and scapular pain over the weekend.    Pertinent History DM, stage III kidney; osteoporosis (mild)    How long can you sit comfortably? 1-2 hours    How long can you stand comfortably? 15-20 min    How long can you walk comfortably? 15-20 minutes (initially  only 1/2 mile)  50 laps .8 mile    Patient Stated Goals be able walk 1 mile; do 2-3 hours of housework;do not want to come to clinic a lot because of high risk with Covid                               [facility] Adult PT Treatment/Exercise - 11/05/20 0001       Moist Heat Therapy   Number Minutes Moist Heat 5 Minutes    Moist Heat Location Cervical;Lumbar Spine      Electrical Stimulation   Electrical Stimulation Location bil lumbar multifidi    Electrical Stimulation Action Pre mod    Electrical Stimulation Parameters with DN 10 min    Electrical Stimulation Goals Pain      Manual Therapy   Soft tissue mobilization bil lumbar paraspinals,gluteals and piriformis; bil cervical paraspinals, upper traps, suboccipitals              Trigger Point Dry Needling - 11/05/20 0001     Consent Given? Yes    Muscles  Treated Head and Neck Upper trapezius;Suboccipitals;Levator scapulae;Cervical multifidi    Dry Needling Comments bil   right hip muscles only   Electrical Stimulation Performed with Dry Needling Yes    Upper Trapezius Response Twitch reponse elicited;Palpable increased muscle length    Suboccipitals Response Palpable increased muscle length    Levator Scapulae Response Twitch response elicited;Palpable increased muscle length    Cervical multifidi Response Palpable increased muscle length    Gluteus Minimus Response Palpable increased muscle length;Twitch response elicited    Gluteus Medius Response Palpable increased muscle length;Twitch response elicited    Gluteus Maximus Response Twitch response elicited;Palpable increased muscle length    Piriformis Response Twitch response elicited;Palpable increased muscle length    Lumbar multifidi Response Palpable increased muscle length   bil                   PT Short Term Goals - 08/27/20 1306       PT SHORT TERM GOAL #1   Title Pt will be independent with her initial HEP to decrease pain and improve ability to be on her feet.    Status Achieved      PT SHORT TERM GOAL #2   Title Pt will report atleast 30% decrease in her back and leg pain/numbness.    Status Achieved               PT Long Term Goals - 10/25/20 1241       PT LONG TERM GOAL #1   Title Pt will demonstrate PSLR to 70 degrees on RLE without increased pain due to improved neural and muscular flexibility for pain management    Time 12    Period Weeks    Status On-going    Target Date 11/19/20      PT LONG TERM GOAL #2   Title Pt will report atleast 50% improvement in low back and buttock pain with daily activity.    Time 12    Period Weeks    Status On-going      PT LONG TERM GOAL #3   Title reduce Rt LE numbness and tingling by 50% with household tasks and care of son    Time 12    Period Weeks    Status On-going      PT LONG TERM GOAL #4    Title Pt  will be independent with her advanced HEP and verbalize understanding of exercise progressions which will allow her to maintain progress after discharge from PT.     Time 12    Period Weeks    Status On-going      PT LONG TERM GOAL #5   Title The patient will have improved cervical sidebending to 35 degrees and rotation to 50 degrees needed for driving    Time 12    Period Weeks    Status On-going      PT LONG TERM GOAL #6   Title Neck disability index improved to 28% or less    Time 12    Period Weeks    Status On-going      PT LONG TERM GOAL #7   Title Left grip improved to 30# of force and left shoulder abduction and external rotation to at least 4/5 to 4+/5    Time 12    Period Weeks    Status On-going                   Plan - 11/05/20 1903     Clinical Impression Statement The patient is able to walk 1 mile although LE symptoms still reactive to sitting in the car 5 minutes.  Periodic neck stiffness but overall pain intensity and headaches decreased.  She reports relief from DN, manual therapy and DN coupled with ES.  Improved soft tissue length in both regions following treatment session.  Therapist monitoring response throughout treatment session.    Personal Factors and Comorbidities Age;Comorbidity 2;Time since onset of injury/illness/exacerbation    Comorbidities DM; CKD; carpal tunnel    Examination-Activity Limitations Caring for Others;Carry;Lift;Bend;Squat    Examination-Participation Restrictions Cleaning;Community Activity;Meal Prep    Rehab Potential Excellent    PT Frequency 1x / week    PT Duration 12 weeks    PT Treatment/Interventions ADLs/Self Care Home Management;Biofeedback;Cryotherapy;Electrical Stimulation;Moist Heat;Traction;Therapeutic activities;Therapeutic exercise;Neuromuscular re-education;Patient/family education;Manual techniques;Passive range of motion;Dry needling;Taping    PT Next Visit Plan see how trip to [place] went  (by plane);  DN/ES; manual therapy;  check grip strength, check cervical ROM, FOTO for low back in next 1-2 visits    PT Home Exercise Plan Access Code: [place] SUMMARY  Visits from Start of Care: 12  Current functional level related to goals / functional outcomes: The patient has been unable to return secondary to illness   Remaining deficits: As above   Education / Equipment: HEP   Patient agrees to discharge. Patient goals were partially met. Patient is being discharged due to a change in medical status.  Patient will benefit from skilled therapeutic intervention in order to improve the following deficits and impairments:  Abnormal gait, Pain, Increased fascial restricitons, Decreased strength, Decreased activity tolerance, Decreased range of motion, Increased muscle spasms, Difficulty walking, Decreased endurance  Visit Diagnosis: Chronic bilateral low back pain, unspecified whether sciatica present  Muscle weakness (generalized)  Cervicalgia     Problem List Patient Active Problem List   Diagnosis Date Noted   Closed fracture of 9th & 10th ribs of right side 12/17/2016   Pneumothorax on right 12/17/2016   Lumbar degenerative disc disease 12/17/2016   Essential hypertension 12/17/2016   Dyslipidemia 10/07/2016   Pulmonary nodule 10/07/2016   Abnormal CXR    Chest pain 10/06/2016   S/P lumbar spinal fusion 08/05/2016   Obstructive sleep apnea syndrome, mild 10/27/2013  Controlled type 2 diabetes mellitus without complication, without long-term current use of insulin ([place]) 06/19/2011   GAD (generalized anxiety disorder) 11/01/2009   Ruben Im, PT 11/05/20 7:11 PM Phone: [phone] Fax: [phone]  Alvera Singh 11/05/2020, 7:11 PM  [facility] [address], [place], [place], [address] Phone: [phone]   Fax:  [phone]  Name: Krista Austin MRN: [id number] Date of Birth: [birth year]

## 2020-11-12 ENCOUNTER — Encounter: Payer: Medicare Other | Admitting: Physical Therapy

## 2020-11-16 ENCOUNTER — Emergency Department (HOSPITAL_COMMUNITY)
Admission: EM | Admit: 2020-11-16 | Discharge: 2020-11-17 | Disposition: A | Discharge status: Home / Self Care | Payer: Medicare Other | Attending: Emergency Medicine | Admitting: Emergency Medicine

## 2020-11-16 ENCOUNTER — Emergency Department (HOSPITAL_COMMUNITY): Payer: Medicare Other

## 2020-11-16 ENCOUNTER — Other Ambulatory Visit: Payer: Self-pay

## 2020-11-16 DIAGNOSIS — U071 COVID-19: Secondary | ICD-10-CM | POA: Diagnosis not present

## 2020-11-16 DIAGNOSIS — Z7982 Long term (current) use of aspirin: Secondary | ICD-10-CM | POA: Diagnosis not present

## 2020-11-16 DIAGNOSIS — Z87891 Personal history of nicotine dependence: Secondary | ICD-10-CM | POA: Diagnosis not present

## 2020-11-16 DIAGNOSIS — B999 Unspecified infectious disease: Secondary | ICD-10-CM

## 2020-11-16 DIAGNOSIS — E119 Type 2 diabetes mellitus without complications: Secondary | ICD-10-CM | POA: Insufficient documentation

## 2020-11-16 DIAGNOSIS — R509 Fever, unspecified: Secondary | ICD-10-CM | POA: Diagnosis present

## 2020-11-16 DIAGNOSIS — J069 Acute upper respiratory infection, unspecified: Secondary | ICD-10-CM

## 2020-11-16 MED ORDER — ACETAMINOPHEN 500 MG PO TABS: 1000.0000 mg | ORAL_TABLET | Freq: Once | ORAL | Status: DC

## 2020-11-16 MED ORDER — IBUPROFEN 200 MG PO TABS
400.0000 mg | ORAL_TABLET | Freq: Once | ORAL | Status: AC
  Administered 2020-11-16: 400 mg via ORAL
  Filled 2020-11-16: qty 2

## 2020-11-16 NOTE — ED Provider Notes (Signed)
[facility] DEPT Provider Note: Krista Spurling, MD, FACEP  CSN: [id number] MRN: [id number] ARRIVAL: 11/16/20 at 2156 ROOM: [place]  11/16/20 10:53 PM Krista Austin is a 75 y.o. female who has had 3 days of fever, chills, headache, cough and body aches.  Today she developed dyspnea on exertion and she tested positive on a rapid COVID test (a confirmatory PCR test is pending).  She last took Tylenol for fever at 8:30 PM but on arrival here her temperature was 101.6.  She rates the pain in her head is a 3 out of 10.  Her shortness of breath has been minimal at rest but becomes moderate to severe with exertion.  Her daughter, who is a PA, has been monitoring her vital signs.  Her oxygen sat has been about 92% on room air at home at rest and has dropped to 88% with exertion.  She was seen at [facility] earlier today and started on 2 drugs for COVID, with doses adjusted for chronic renal insufficiency.  She denies loss of taste or smell, nausea, vomiting or diarrhea.   Past Medical History:  Diagnosis Date   Adenomatous polyp    Arthritis    "mostly fingers" (10/07/2016)   At high risk for falls    Celiac disease    "I do not have the disease; 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" (10/07/2016)   Chronic back pain    "worse in my neck; lower back also affected" (10/07/2016)   Chronic cholecystitis 1/74/0814   Complication of anesthesia    "never fully regained my taste after my cervical fusion; I don't take much RX so I'm very sensitive to any sedation" (10/07/2016)   Concussion    X2   DDD (degenerative disc disease), cervical    lumbar   Depression    "hx; take Prozac to keep me stable" (10/07/2016)   Dizziness    caused by compression of cervical disc   Family history of adverse reaction to anesthesia    "daughters get PONV; one daughter breaks out severely; grandson got suspended in a state of anxiousness after  kidney surgery; had to be held for hours"   GERD (gastroesophageal reflux disease)    Hyperlipidemia    OSA on CPAP    Pneumonia    "once as an adult" (10/07/2016)   Thyroid nodule    Type II diabetes mellitus ([place])    controlled by diet and exercide    Past Surgical History:  Procedure Laterality Date   ANTERIOR CERVICAL DECOMP/DISCECTOMY FUSION  2015   BACK SURGERY     CARPAL TUNNEL RELEASE Bilateral    CATARACT EXTRACTION, BILATERAL Bilateral 09/10/2014 - 09/24/2014   by Darleen Crocker   CHOLECYSTECTOMY N/A 10/16/2016   Procedure: LAPAROSCOPIC CHOLECYSTECTOMY;  Surgeon: [name], MD;  Location: [place] [facility];  Service: General;  Laterality: N/A;   LAPAROSCOPIC CHOLECYSTECTOMY  10/16/2016   LUMBAR [place] SURGERY  2007   TONSILLECTOMY  05/05/1983   TUBAL LIGATION  02/09/1979    Family History  Problem Relation Age of Onset   Mitral valve prolapse Mother    Hyperlipidemia Mother    Macular degeneration Mother    Hypothyroidism Mother    Arthritis Mother    Atrial fibrillation Father    COPD Father    Osteoarthritis Sister    Hypothyroidism Sister    Hyperlipidemia Sister    Dementia Maternal Grandmother    Breast  cancer Maternal Grandmother    Hypertension Maternal Grandmother    Arthritis Maternal Grandmother    Diabetes type I Maternal Grandfather    Arthritis Sister    Hypothyroidism Sister    Fibromyalgia Sister    Other Other        Osgood-schlatter    Social History   Tobacco Use   Smoking status: Former    Packs/day: 1.00    Years: 50.00    Pack years: 50.00    Types: Cigarettes    Quit date: 10/16/2016    Years since quitting: 4.0   Smokeless tobacco: Never   Tobacco comments:    10/16/2016 "weaned down the past 9 days; on RX now; done!!!!!!!!"  Vaping Use   Vaping Use: Never used  Substance Use Topics   Alcohol use: No   Drug use: No    Prior to Admission medications   Medication Sig Start Date End Date Taking? Authorizing Provider  acetaminophen  (TYLENOL) 500 MG tablet Take 1 tablet (500 mg total) by mouth every 4 (four) hours. 12/18/16  Yes Meuth, Blaine Hamper, PA-C  aspirin EC 81 MG tablet Take 81 mg by mouth at bedtime.    Yes [provider]  cetirizine (ZYRTEC) 10 MG tablet Take 10 mg by mouth at bedtime.   Yes [provider]  doxycycline (VIBRAMYCIN) 100 MG capsule Take 1 capsule (100 mg total) by mouth 2 (two) times daily for 7 days. 11/17/20 11/24/20 Yes Avondre Richens, MD  FLUoxetine (PROZAC) 40 MG capsule Take 40 mg by mouth at bedtime.    Yes [provider]  gabapentin (NEURONTIN) 100 MG capsule Take 100 mg by mouth 3 (three) times daily as needed.   Yes [provider]  gabapentin (NEURONTIN) 300 MG capsule Take 300 mg by mouth daily.   Yes [provider]  lisinopril (PRINIVIL,ZESTRIL) 10 MG tablet Take 10 mg by mouth daily.   Yes [provider]  Multiple Vitamins-Minerals (MULTIVITAMIN WITH MINERALS) tablet Take 1 tablet by mouth at bedtime.    Yes [provider]  Omega-3 Fatty Acids (FISH OIL PO) Take 1 capsule by mouth daily.    Yes [provider]  pantoprazole (PROTONIX) 40 MG tablet Take 40 mg by mouth daily. 09/30/16  Yes [provider]  rosuvastatin (CRESTOR) 10 MG tablet Take 10 mg by mouth at bedtime. Reported on 09/18/2015 07/22/15  Yes [provider]  hydrocortisone cream 1 % Apply 1 application topically daily as needed for itching.    [provider]  ibuprofen (ADVIL,MOTRIN) 400 MG tablet Take 1 tablet (400 mg total) by mouth every 6 (six) hours. 12/18/16   Meuth, Brooke A, PA-C    Allergies Lactose intolerance (gi)   REVIEW OF SYSTEMS  Negative except as noted here or in the History of Present Illness.   PHYSICAL EXAMINATION  Initial Vital Signs Blood pressure 101/69, pulse 78, temperature (!) 101.6 F (38.7 C), temperature source Oral, resp. rate 18, height 5\' 5"  (1.651 m), weight 70.3 kg, SpO2 96  %.  Examination General: Well-developed, well-nourished female in no acute distress; appearance consistent with age of record HENT: normocephalic; atraumatic Eyes: pupils equal, round and reactive to light; extraocular muscles intact; Neck: supple Heart: regular rate and rhythm Lungs: Mildly decreased air movement bilaterally Abdomen: soft; nondistended; nontender; bowel sounds present Extremities: No deformity; full range of motion; pulses normal Neurologic: Awake, alert and oriented; motor function intact in all extremities and symmetric; no facial droop Skin: Warm and  dry Psychiatric: Normal mood and affect   RESULTS  Summary of this visit's results, reviewed and interpreted by myself:   EKG Interpretation  Date/Time:    Ventricular Rate:    PR Interval:    QRS Duration:   QT Interval:    QTC Calculation:   R Axis:     Text Interpretation:         Laboratory Studies: Results for orders placed or performed during the hospital encounter of 11/16/20 (from the past 24 hour(s))  CBC with Differential/Platelet     Status: None   Collection Time: 11/16/20 11:31 PM  Result Value Ref Range   WBC 5.1 4.0 - 10.5 K/uL   RBC 4.30 3.87 - 5.11 MIL/uL   Hemoglobin 13.0 12.0 - 15.0 g/dL   HCT 38.8 36.0 - 46.0 %   MCV 90.2 80.0 - 100.0 fL   MCH 30.2 26.0 - 34.0 pg   MCHC 33.5 30.0 - 36.0 g/dL   RDW 13.8 11.5 - 15.5 %   Platelets 157 150 - 400 K/uL   nRBC 0.0 0.0 - 0.2 %   Neutrophils Relative % 66 %   Neutro Abs 3.3 1.7 - 7.7 K/uL   Lymphocytes Relative 21 %   Lymphs Abs 1.1 0.7 - 4.0 K/uL   Monocytes Relative 13 %   Monocytes Absolute 0.7 0.1 - 1.0 K/uL   Eosinophils Relative 0 %   Eosinophils Absolute 0.0 0.0 - 0.5 K/uL   Basophils Relative 0 %   Basophils Absolute 0.0 0.0 - 0.1 K/uL   Immature Granulocytes 0 %   Abs Immature Granulocytes 0.02 0.00 - 0.07 K/uL  Basic metabolic panel     Status: Abnormal   Collection Time: 11/16/20 11:31 PM  Result Value Ref Range    Sodium 137 135 - 145 mmol/L   Potassium 3.9 3.5 - 5.1 mmol/L   Chloride 105 98 - 111 mmol/L   CO2 24 22 - 32 mmol/L   Glucose, Bld 144 (H) 70 - 99 mg/dL   BUN 19 8 - 23 mg/dL   Creatinine, Ser 0.79 0.44 - 1.00 mg/dL   Calcium 9.3 8.9 - 10.3 mg/dL   GFR, Estimated >60 >60 mL/min   Anion gap 8 5 - 15  Lactic acid, plasma     Status: None   Collection Time: 11/16/20 11:31 PM  Result Value Ref Range   Lactic Acid, Venous 0.9 0.5 - 1.9 mmol/L   Imaging Studies: DG Chest 2 View  Result Date: 11/16/2020 CLINICAL DATA:  Shortness of breath, COVID EXAM: CHEST - 2 VIEW COMPARISON:  12/17/2016 CT 06/17/2020 FINDINGS: Patchy left infrahilar airspace disease. No pleural effusion. Normal cardiomediastinal silhouette. No pneumothorax. IMPRESSION: Streaky atelectasis or mild pneumonia at the lingula and left base. Electronically Signed   By: Donavan Foil M.D.   On: 11/16/2020 23:04    ED COURSE and MDM  Nursing notes, initial and subsequent vitals signs, including pulse oximetry, reviewed and interpreted by myself.  Vitals:   11/17/20 0017 11/17/20 0045 11/17/20 0100 11/17/20 0115  BP:  98/63 106/66 107/61  Pulse: 73 70 65 68  Resp: 20 16 10 20   Temp:      TempSrc:      SpO2: 95% 94% 97% 96%  Weight:      Height:       Medications  doxycycline (VIBRA-TABS) tablet 100 mg (has no administration in time range)  ibuprofen (ADVIL) tablet 400 mg (400 mg Oral Given 11/16/20 2320)   11:31  PM With the patient's permission I discussed her care with her daughter, Jolinda Croak, [place].  She indicates the patient was started on Paxlovid today which consists of a combination of 2 different pills.  Her daughter states the patient has had some episodes of mild confusion and this evening after dinner had what she described as rigors with her chills.  It was after this that her fever spiked.  1:18 AM Patient's oxygen saturation 94 to 95% on room air.  She is in no distress and feels comfortable going home.  The  changes seen on her chest x-ray could represent a secondary bacterial pneumonia superimposed on COVID.  We will start her on doxycycline which does not have any documented interactions with Paxlovid.  PROCEDURES  Procedures   ED DIAGNOSES     ICD-10-CM   1. Acute respiratory disease due to COVID-19 virus  U07.1    J06.9     2. Superimposed infection  B99.9          Flonnie Wierman, MD 11/17/20 [phone]

## 2020-11-16 NOTE — ED Triage Notes (Signed)
Pt BIB by EMS due to having exertional SOB. ZPt tested positive for COVID on a rapid test today. Per pt, she has been having chills, fever, headaches, and body aches for the last 3 days. Today the exertional SOB started. Pt had tylenol at 20:30 for fever.

## 2020-11-17 DIAGNOSIS — U071 COVID-19: Secondary | ICD-10-CM | POA: Diagnosis not present

## 2020-11-17 LAB — CBC WITH DIFFERENTIAL/PLATELET
Abs Immature Granulocytes: 0.02 10*3/uL (ref 0.00–0.07)
Basophils Absolute: 0 10*3/uL (ref 0.0–0.1)
Basophils Relative: 0 %
Eosinophils Absolute: 0 10*3/uL (ref 0.0–0.5)
Eosinophils Relative: 0 %
HCT: 38.8 % (ref 36.0–46.0)
Hemoglobin: 13 g/dL (ref 12.0–15.0)
Immature Granulocytes: 0 %
Lymphocytes Relative: 21 %
Lymphs Abs: 1.1 10*3/uL (ref 0.7–4.0)
MCH: 30.2 pg (ref 26.0–34.0)
MCHC: 33.5 g/dL (ref 30.0–36.0)
MCV: 90.2 fL (ref 80.0–100.0)
Monocytes Absolute: 0.7 10*3/uL (ref 0.1–1.0)
Monocytes Relative: 13 %
Neutro Abs: 3.3 10*3/uL (ref 1.7–7.7)
Neutrophils Relative %: 66 %
Platelets: 157 10*3/uL (ref 150–400)
RBC: 4.3 MIL/uL (ref 3.87–5.11)
RDW: 13.8 % (ref 11.5–15.5)
WBC: 5.1 10*3/uL (ref 4.0–10.5)
nRBC: 0 % (ref 0.0–0.2)

## 2020-11-17 LAB — BASIC METABOLIC PANEL
Anion gap: 8 (ref 5–15)
BUN: 19 mg/dL (ref 8–23)
CO2: 24 mmol/L (ref 22–32)
Calcium: 9.3 mg/dL (ref 8.9–10.3)
Chloride: 105 mmol/L (ref 98–111)
Creatinine, Ser: 0.79 mg/dL (ref 0.44–1.00)
GFR, Estimated: >60 mL/min (ref >60)
Glucose, Bld: 144 mg/dL — ABNORMAL HIGH (ref 70–99)
Potassium: 3.9 mmol/L (ref 3.5–5.1)
Sodium: 137 mmol/L (ref 135–145)

## 2020-11-17 LAB — LACTIC ACID, PLASMA: Lactic Acid, Venous: 0.9 mmol/L (ref 0.5–1.9)

## 2020-11-17 MED ORDER — DOXYCYCLINE HYCLATE 100 MG PO CAPS: 100.0000 mg | ORAL_CAPSULE | Freq: Two times a day (BID) | ORAL | 0 refills | Status: AC

## 2020-11-17 MED ORDER — DOXYCYCLINE HYCLATE 100 MG PO TABS
100.0000 mg | ORAL_TABLET | Freq: Once | ORAL | Status: AC
  Administered 2020-11-17: 100 mg via ORAL
  Filled 2020-11-17: qty 1

## 2020-11-17 NOTE — Discharge Instructions (Addendum)
There were some changes on your chest x-ray that were suspicious for an early pneumonia.  As a precaution we will place you on doxycycline for 1 week.  It is not known to have any adverse interactions with Paxlovid.  Be sure to swallow the capsules whole with an adequate amount of water as they can cause serious esophageal irritation if they get caught in your esophagus and break open.  Your oxygen saturation in the ED was 94 to 95% on room air when you were at rest.

## 2020-11-19 ENCOUNTER — Ambulatory Visit: Payer: Medicare Other | Admitting: Physical Therapy

## 2020-11-22 LAB — CULTURE, BLOOD (ROUTINE X 2)
Culture: NO GROWTH
Culture: NO GROWTH
Special Requests: ADEQUATE
Special Requests: ADEQUATE

## 2020-11-28 ENCOUNTER — Emergency Department (HOSPITAL_COMMUNITY): Payer: Medicare Other

## 2020-11-28 ENCOUNTER — Other Ambulatory Visit: Payer: Self-pay

## 2020-11-28 ENCOUNTER — Encounter (HOSPITAL_COMMUNITY): Payer: Self-pay

## 2020-11-28 ENCOUNTER — Emergency Department (HOSPITAL_COMMUNITY)
Admission: EM | Admit: 2020-11-28 | Discharge: 2020-11-28 | Disposition: A | Discharge status: Home / Self Care | Payer: Medicare Other | Attending: Emergency Medicine | Admitting: Emergency Medicine

## 2020-11-28 DIAGNOSIS — U071 COVID-19: Secondary | ICD-10-CM | POA: Diagnosis not present

## 2020-11-28 DIAGNOSIS — Z7982 Long term (current) use of aspirin: Secondary | ICD-10-CM | POA: Insufficient documentation

## 2020-11-28 DIAGNOSIS — E119 Type 2 diabetes mellitus without complications: Secondary | ICD-10-CM | POA: Insufficient documentation

## 2020-11-28 DIAGNOSIS — R0602 Shortness of breath: Secondary | ICD-10-CM | POA: Diagnosis present

## 2020-11-28 DIAGNOSIS — R0600 Dyspnea, unspecified: Secondary | ICD-10-CM

## 2020-11-28 DIAGNOSIS — Z79899 Other long term (current) drug therapy: Secondary | ICD-10-CM | POA: Insufficient documentation

## 2020-11-28 DIAGNOSIS — Z87891 Personal history of nicotine dependence: Secondary | ICD-10-CM | POA: Insufficient documentation

## 2020-11-28 DIAGNOSIS — I10 Essential (primary) hypertension: Secondary | ICD-10-CM | POA: Insufficient documentation

## 2020-11-28 DIAGNOSIS — R0609 Other forms of dyspnea: Secondary | ICD-10-CM

## 2020-11-28 LAB — COMPREHENSIVE METABOLIC PANEL
ALT: 33 U/L (ref 0–44)
AST: 26 U/L (ref 15–41)
Albumin: 3.9 g/dL (ref 3.5–5.0)
Alkaline Phosphatase: 89 U/L (ref 38–126)
Anion gap: 8 (ref 5–15)
BUN: 19 mg/dL (ref 8–23)
CO2: 23 mmol/L (ref 22–32)
Calcium: 9.1 mg/dL (ref 8.9–10.3)
Chloride: 108 mmol/L (ref 98–111)
Creatinine, Ser: 1.03 mg/dL — ABNORMAL HIGH (ref 0.44–1.00)
GFR, Estimated: 57 mL/min — ABNORMAL LOW (ref >60)
Glucose, Bld: 173 mg/dL — ABNORMAL HIGH (ref 70–99)
Potassium: 3.8 mmol/L (ref 3.5–5.1)
Sodium: 139 mmol/L (ref 135–145)
Total Bilirubin: 0.7 mg/dL (ref 0.3–1.2)
Total Protein: 7 g/dL (ref 6.5–8.1)

## 2020-11-28 LAB — CBC WITH DIFFERENTIAL/PLATELET
Abs Immature Granulocytes: 0.03 10*3/uL (ref 0.00–0.07)
Basophils Absolute: 0 10*3/uL (ref 0.0–0.1)
Basophils Relative: 1 %
Eosinophils Absolute: 0.1 10*3/uL (ref 0.0–0.5)
Eosinophils Relative: 2 %
HCT: 41.8 % (ref 36.0–46.0)
Hemoglobin: 14 g/dL (ref 12.0–15.0)
Immature Granulocytes: 1 %
Lymphocytes Relative: 32 %
Lymphs Abs: 2 10*3/uL (ref 0.7–4.0)
MCH: 29.9 pg (ref 26.0–34.0)
MCHC: 33.5 g/dL (ref 30.0–36.0)
MCV: 89.3 fL (ref 80.0–100.0)
Monocytes Absolute: 0.7 10*3/uL (ref 0.1–1.0)
Monocytes Relative: 11 %
Neutro Abs: 3.4 10*3/uL (ref 1.7–7.7)
Neutrophils Relative %: 53 %
Platelets: 234 10*3/uL (ref 150–400)
RBC: 4.68 MIL/uL (ref 3.87–5.11)
RDW: 13.8 % (ref 11.5–15.5)
WBC: 6.2 10*3/uL (ref 4.0–10.5)
nRBC: 0 % (ref 0.0–0.2)

## 2020-11-28 MED ORDER — IOHEXOL 350 MG/ML SOLN
75.0000 mL | Freq: Once | INTRAVENOUS | Status: AC | PRN
  Administered 2020-11-28: 75 mL via INTRAVENOUS

## 2020-11-28 MED ORDER — ALBUTEROL SULFATE HFA 108 (90 BASE) MCG/ACT IN AERS
2.0000 | INHALATION_SPRAY | Freq: Once | RESPIRATORY_TRACT | Status: AC
  Administered 2020-11-28: 2 via RESPIRATORY_TRACT
  Filled 2020-11-28: qty 6.7

## 2020-11-28 NOTE — ED Provider Notes (Signed)
[facility] DEPT Provider Note   CSN: [id number] Arrival date & time: 11/28/20  1302     History Chief Complaint  Patient presents with   Shortness of Breath   Fatigue   oxygen low    Krista Austin is a 75 y.o. female presenting for evaluation of shortness of breath.  Patient states she was diagnosed with COVID 2 weeks ago.  1 week ago, she had worsening shortness of breath, was diagnosed with pneumonia, started on doxycycline and Paxlovid.  She finished this completely.  4 days ago she developed increased nasal congestion, was diagnosed with a sinus infection and started on a Z-Pak, has 1 day left.  Patient is here today because she had increased shortness of breath which is been going on for 2 days.  She states she is monitoring her oxygen at home and sats are between 88 and 98%, she desats with exertion.  She is not normally on oxygen.  Her shortness of breath is improved in the morning after using CPAP at night.  She denies chest pain, nausea, vomiting, abdominal pain.  No leg pain or swelling.  Additional history taken chart reviewed.  Patient with a history of arthritis, chronic back pain, depression, GERD, hyperlipidemia, OSA on CPAP, diabetes   HPI     Past Medical History:  Diagnosis Date   Adenomatous polyp    Arthritis    "mostly fingers" (10/07/2016)   At high risk for falls    Celiac disease    "I do not have the disease; 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" (10/07/2016)   Chronic back pain    "worse in my neck; lower back also affected" (10/07/2016)   Chronic cholecystitis [id number]   Complication of anesthesia    "never fully regained my taste after my cervical fusion; I don't take much RX so I'm very sensitive to any sedation" (10/07/2016)   Concussion    X2   DDD (degenerative disc disease), cervical    lumbar   Depression    "hx; take Prozac to keep me stable" (10/07/2016)   Dizziness    caused by compression of cervical disc   Family  history of adverse reaction to anesthesia    "daughters get PONV; one daughter breaks out severely; grandson got suspended in a state of anxiousness after kidney surgery; had to be held for hours"   GERD (gastroesophageal reflux disease)    Hyperlipidemia    OSA on CPAP    Pneumonia    "once as an adult" (10/07/2016)   Thyroid nodule    Type II diabetes mellitus ([place])    controlled by diet and exercide    Patient Active Problem List   Diagnosis Date Noted   Closed fracture of 9th & 10th ribs of right side 12/17/2016   Pneumothorax on right 12/17/2016   Lumbar degenerative disc disease 12/17/2016   Essential hypertension 12/17/2016   Dyslipidemia 10/07/2016   Pulmonary nodule 10/07/2016   Abnormal CXR    Chest pain 10/06/2016   S/P lumbar spinal fusion 08/05/2016   Obstructive sleep apnea syndrome, mild 10/27/2013   Controlled type 2 diabetes mellitus without complication, without long-term current use of insulin ([place]) 06/19/2011   GAD (generalized anxiety disorder) 11/01/2009    Past Surgical History:  Procedure Laterality Date   ANTERIOR CERVICAL DECOMP/DISCECTOMY FUSION  2015   BACK SURGERY     CARPAL TUNNEL RELEASE Bilateral    CATARACT EXTRACTION, BILATERAL Bilateral 09/10/2014 - 09/24/2014   by Darleen Crocker  CHOLECYSTECTOMY N/A 10/16/2016   Procedure: LAPAROSCOPIC CHOLECYSTECTOMY;  Surgeon: [name], MD;  Location: [facility];  Service: General;  Laterality: N/A;   LAPAROSCOPIC CHOLECYSTECTOMY  10/16/2016   LUMBAR [place] SURGERY  2007   TONSILLECTOMY  05/05/1983   TUBAL LIGATION  02/09/1979     OB History   No obstetric history on file.     Family History  Problem Relation Age of Onset   Mitral valve prolapse Mother    Hyperlipidemia Mother    Macular degeneration Mother    Hypothyroidism Mother    Arthritis Mother    Atrial fibrillation Father    COPD Father    Osteoarthritis Sister    Hypothyroidism Sister    Hyperlipidemia Sister    Dementia Maternal  Grandmother    Breast cancer Maternal Grandmother    Hypertension Maternal Grandmother    Arthritis Maternal Grandmother    Diabetes type I Maternal Grandfather    Arthritis Sister    Hypothyroidism Sister    Fibromyalgia Sister    Other Other        Osgood-schlatter    Social History   Tobacco Use   Smoking status: Former    Packs/day: 1.00    Years: 50.00    Pack years: 50.00    Types: Cigarettes    Quit date: 10/16/2016    Years since quitting: 4.1   Smokeless tobacco: Never   Tobacco comments:    10/16/2016 "weaned down the past 9 days; on RX now; done!!!!!!!!"  Vaping Use   Vaping Use: Never used  Substance Use Topics   Alcohol use: No   Drug use: No    Home Medications Prior to Admission medications   Medication Sig Start Date End Date Taking? Authorizing Provider  acetaminophen (TYLENOL) 500 MG tablet Take 1 tablet (500 mg total) by mouth every 4 (four) hours. 12/18/16   Meuth, Blaine Hamper, PA-C  aspirin EC 81 MG tablet Take 81 mg by mouth at bedtime.     [provider]  cetirizine (ZYRTEC) 10 MG tablet Take 10 mg by mouth at bedtime.    [provider]  FLUoxetine (PROZAC) 40 MG capsule Take 40 mg by mouth at bedtime.     [provider]  gabapentin (NEURONTIN) 100 MG capsule Take 100 mg by mouth 3 (three) times daily as needed.    [provider]  gabapentin (NEURONTIN) 300 MG capsule Take 300 mg by mouth daily.    [provider]  hydrocortisone cream 1 % Apply 1 application topically daily as needed for itching.    [provider]  ibuprofen (ADVIL,MOTRIN) 400 MG tablet Take 1 tablet (400 mg total) by mouth every 6 (six) hours. 12/18/16   Meuth, Brooke A, PA-C  lisinopril (PRINIVIL,ZESTRIL) 10 MG tablet Take 10 mg by mouth daily.    [provider]  Multiple Vitamins-Minerals (MULTIVITAMIN WITH MINERALS) tablet Take 1 tablet by mouth at bedtime.     [provider]  Omega-3 Fatty Acids (FISH OIL  PO) Take 1 capsule by mouth daily.     [provider]  pantoprazole (PROTONIX) 40 MG tablet Take 40 mg by mouth daily. 09/30/16   [provider]  rosuvastatin (CRESTOR) 10 MG tablet Take 10 mg by mouth at bedtime. Reported on 09/18/2015 07/22/15   [provider]    Allergies    Lactose intolerance (gi)  Review of Systems   Review of Systems  HENT:  Positive for congestion.   Respiratory:  Positive for shortness of breath.   All other systems reviewed and are negative.  Physical Exam Updated Vital Signs BP 113/77   Pulse 68   Temp 98.4 F (36.9 C) (Oral)   Resp 17   Ht '5\' 5"'$  (1.651 m)   Wt 70.3 kg   SpO2 99%   BMI 25.79 kg/m   Physical Exam Vitals and nursing note reviewed.  Constitutional:      General: She is not in acute distress.    Appearance: Normal appearance.     Comments: Resting in the bed in no acute distress  HENT:     Head: Normocephalic and atraumatic.  Eyes:     Conjunctiva/sclera: Conjunctivae normal.     Pupils: Pupils are equal, round, and reactive to light.  Cardiovascular:     Rate and Rhythm: Normal rate and regular rhythm.     Pulses: Normal pulses.  Pulmonary:     Effort: Pulmonary effort is normal. No respiratory distress.     Breath sounds: Normal breath sounds. No wheezing.     Comments: Speaking in full sentences.  Clear lung sounds in all fields.  Sats stable on room air while talking in the room. Abdominal:     General: There is no distension.     Palpations: Abdomen is soft. There is no mass.     Tenderness: There is no abdominal tenderness. There is no guarding or rebound.  Musculoskeletal:        General: Normal range of motion.     Cervical back: Normal range of motion and neck supple.     Right lower leg: No edema.     Left lower leg: No edema.     Comments: No leg pain or swelling  Skin:    General: Skin is warm and dry.     Capillary Refill: Capillary refill takes less than 2 seconds.   Neurological:     Mental Status: She is alert and oriented to person, place, and time.  Psychiatric:        Mood and Affect: Mood and affect normal.        Speech: Speech normal.        Behavior: Behavior normal.    ED Results / Procedures / Treatments   Labs (all labs ordered are listed, but only abnormal results are displayed) Labs Reviewed  COMPREHENSIVE METABOLIC PANEL - Abnormal; Notable for the following components:      Result Value   Glucose, Bld 173 (*)    Creatinine, Ser 1.03 (*)    GFR, Estimated 57 (*)    All other components within normal limits  CBC WITH DIFFERENTIAL/PLATELET    EKG EKG Interpretation  Date/Time:  Thursday November 28 2020 13:36:22 EDT Ventricular Rate:  81 PR Interval:  178 QRS Duration: 74 QT Interval:  372 QTC Calculation: 432 R Axis:   58 Text Interpretation: Normal sinus rhythm Anterior infarct , age undetermined Abnormal ECG No significant change since prior 6/18 Confirmed by Aletta Edouard [phone]) on 11/28/2020 1:42:18 PM  Radiology DG Chest 2 View  Result Date: 11/28/2020 CLINICAL DATA:  sob EXAM: CHEST - 2 VIEW COMPARISON:  November 16, 2020 FINDINGS: The cardiomediastinal silhouette is unchanged in contour.Decreased conspicuity of previously described LEFT retrocardiac opacity. No pleural effusion. No pneumothorax. No acute pleuroparenchymal abnormality. Postsurgical changes of the RIGHT upper quadrant. Status post ACDF. Multilevel degenerative changes of the thoracic spine. IMPRESSION: No acute cardiopulmonary abnormality. Decreased conspicuity of LEFT retrocardiac opacity. Electronically Signed   By:  Valentino Saxon MD   On: 11/28/2020 14:23   CT Angio Chest PE W and/or Wo Contrast  Result Date: 11/28/2020 CLINICAL DATA:  PE suspected, high prob Fatigue, shortness of breath and decreased oxygen saturation. COVID positive 11/16/2020. EXAM: CT ANGIOGRAPHY CHEST WITH CONTRAST TECHNIQUE: Multidetector CT imaging of the chest was performed  using the standard protocol during bolus administration of intravenous contrast. Multiplanar CT image reconstructions and MIPs were obtained to evaluate the vascular anatomy. CONTRAST:  70m OMNIPAQUE IOHEXOL 350 MG/ML SOLN COMPARISON:  Chest radiograph earlier today.  Chest CT 06/17/2020 FINDINGS: Cardiovascular: There are no filling defects within the pulmonary arteries to suggest pulmonary embolus. Mild aortic atherosclerosis without aneurysm. Coronary artery calcifications. Upper normal heart size. No pericardial effusion. Mediastinum/Nodes: No enlarged mediastinal or hilar lymph nodes. No esophageal wall thickening. No visualized thyroid nodule. Lungs/Pleura: Minimal emphysema. Mild hypoventilatory changes in the dependent lung bases, as well as heterogeneous pulmonary parenchyma no focal or confluent airspace disease. The previous tiny 2 mm right upper lobe pulmonary nodule is not well seen on the current exam. No new pulmonary nodule. No pleural fluid. No findings of pulmonary edema. Upper Abdomen: No acute or unexpected findings.  Cholecystectomy. Musculoskeletal: Thoracic spondylosis. Degenerative change of both glenohumeral joints. There are no acute or suspicious osseous abnormalities. Review of the MIP images confirms the above findings. IMPRESSION: 1. No pulmonary embolus. 2. Mild heterogeneous pulmonary parenchyma in the lung bases typically seen with small airways disease. No confluent pneumonia. 3. Mild emphysema. 4. Aortic atherosclerosis and coronary artery calcifications. Aortic Atherosclerosis (ICD10-I70.0) and Emphysema (ICD10-J43.9). Electronically Signed   By: MKeith RakeM.D.   On: 11/28/2020 17:25    Procedures Procedures   Medications Ordered in ED Medications  iohexol (OMNIPAQUE) 350 MG/ML injection 75 mL (75 mLs Intravenous Contrast Given 11/28/20 1707)  albuterol (VENTOLIN HFA) 108 (90 Base) MCG/ACT inhaler 2 puff (2 puffs Inhalation Given 11/28/20 1834)    ED Course  I  have reviewed the triage vital signs and the nursing notes.  Pertinent labs & imaging results that were available during my care of the patient were reviewed by me and considered in my medical decision making (see chart for details).   MDM Rules/Calculators/A&P                           Patient presented for evaluation of shortness of breath, worse in the last 48 hours.  On exam, patient appears nontoxic.  Shortness of breath is worse with exertion.  Concern for superimposed infection, COPD, PE, ACS, CHF. Will order labs, cxr, and cta.   Labs interpreted by me, overall reassuring.  Chest x-ray viewed and independently interpreted by me, no pna, pneumothorax or effusion.  CTA pending.  CTA negative for PE.  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  Will treat with albuterol and have patient follow closely with PCP.  Patient was able to ambulate in the ER without oxygenation dropping below 94%.  At this time, patient appears safe for discharge.  Return precautions given.  Patient states she understands and agrees to plan.   Final Clinical Impression(s) / ED Diagnoses Final diagnoses:  Dyspnea on exertion  COVID-19    Rx / DC Orders ED Discharge Orders     None        CFranchot Heidelberg PA-C 11/28/20 1924    PBlanchie Dessert MD 11/29/20 1751

## 2020-11-28 NOTE — ED Notes (Signed)
Pt ambulated to the bathroom O2 stats between 94% and 100%

## 2020-11-28 NOTE — Discharge Instructions (Addendum)
Your work-up today was overall reassuring.  Your x-ray and CT scan were negative for signs of pneumonia, fluid, or a PE. Use albuterol as needed for shortness of breath.  Use two puffs up to every 4 hours as needed. Follow-up closely with your primary care doctor for recheck of your symptoms. Return to the emergency room if you develop fevers, chest pain, increased difficulty breathing, any new, worsening, or concerning symptoms.

## 2020-11-28 NOTE — ED Provider Notes (Signed)
Emergency Medicine Provider Triage Evaluation Note  Krista Austin , a 75 y.o. female  was evaluated in triage.  Pt complains of SOB and fatigue. Is on day 13 of COVID. Has had paxlovid, doxy, z pack. Pt states that she is still SOB. Oxygen here 98 percent. States that at home it has been running from 88-98 percent. History of COPD. Does not wear oxygen at home. NO CP. No hemoptysis.   Review of Systems  Positive: SOB, clammy  Negative: Fevers, nausea, vomiting, diarrhea  Physical Exam  BP 112/70 (BP Location: Right Arm)   Pulse 90   Temp 98.4 F (36.9 C) (Oral)   Resp 18   Ht '5\' 5"'$  (1.651 m)   Wt 70.3 kg   SpO2 98%   BMI 25.79 kg/m  Gen:   Awake, no distress   Resp:  Normal effort  MSK:   Moves extremities without difficulty  Other:   Medical Decision Making  Medically screening exam initiated at 1:19 PM.  Appropriate orders placed.  SUNDIE FIQUEROA was informed that the remainder of the evaluation will be completed by another provider, this initial triage assessment does not replace that evaluation, and the importance of remaining in the ED until their evaluation is complete.     Alfredia Client, PA-C 11/28/20 1321    Quintella Reichert, MD 11/30/20 [phone]

## 2020-11-28 NOTE — ED Triage Notes (Signed)
Patient was Covid + on 11/16/20. Patient c/o fatigue, SOB, and low O2 sats. Patient states she was diagnosed with pneumonia.  Patient states she was placed on the Z-pack 4 days ago.  Sats in triage 98%. Patient states she has been feeling "more breathless."  Patient states her PCP stated that she needed to be reevaluated.

## 2020-11-29 ENCOUNTER — Ambulatory Visit: Payer: Medicare Other | Admitting: Physical Therapy

## 2020-12-20 ENCOUNTER — Ambulatory Visit (INDEPENDENT_AMBULATORY_CARE_PROVIDER_SITE_OTHER): Payer: Medicare Other | Admitting: Family

## 2020-12-20 ENCOUNTER — Encounter: Payer: Self-pay | Admitting: Family

## 2020-12-20 ENCOUNTER — Other Ambulatory Visit: Payer: Self-pay

## 2020-12-20 ENCOUNTER — Ambulatory Visit (INDEPENDENT_AMBULATORY_CARE_PROVIDER_SITE_OTHER): Payer: Medicare Other

## 2020-12-20 DIAGNOSIS — G8929 Other chronic pain: Secondary | ICD-10-CM

## 2020-12-20 DIAGNOSIS — M25512 Pain in left shoulder: Secondary | ICD-10-CM | POA: Diagnosis not present

## 2020-12-20 DIAGNOSIS — M7542 Impingement syndrome of left shoulder: Secondary | ICD-10-CM | POA: Diagnosis not present

## 2020-12-20 MED ORDER — MELOXICAM 7.5 MG PO TABS: 7.5000 mg | ORAL_TABLET | Freq: Every day | ORAL | 0 refills | Status: DC

## 2020-12-20 NOTE — Progress Notes (Signed)
Office Visit Note   Patient: Krista Austin           Date of Birth: 11/17/1945           MRN: [id number] Visit Date: 12/20/2020              Requested by: Doyle Askew, PA-C No address on file PCP: Doyle Askew, PA-C  Chief Complaint  Patient presents with   Left Shoulder - Pain      HPI: The patient is a 75 year old woman seen today for initial evaluation of left shoulder pain this is been ongoing for many months now.  Off-and-on it waxes and wanes.  She has intermittent sharp shooting pains sometimes these go straight into her shoulder sometimes radiating down the anterior upper arm.  Things have been getting worse especially this last week.  She is having difficulty sleeping due to pain dull aching pain at night.  Loss of range of motion pain reaching behind her back or above her head  Assessment & Plan: Visit Diagnoses:  1. Chronic left shoulder pain   2. Impingement syndrome of left shoulder     Plan: Rotator cuff tendinopathy left. Depo-Medrol injection today.  Meloxicam prescription as well.  She will begin taking the meloxicam next week if she does not have any relief of her shoulder by next Friday.  Discussed expected course.  Follow-Up Instructions: No follow-ups on file.   Left Shoulder Exam   Tenderness  The patient is experiencing tenderness in the biceps tendon.  Range of Motion  The patient has normal left shoulder ROM.  Muscle Strength  The patient has normal left shoulder strength.  Tests  Impingement: positive Drop arm: negative  Other  Sensation: normal Pulse: present      Patient is alert, oriented, no adenopathy, well-dressed, normal affect, normal respiratory effort.   Imaging: No results found. No images are attached to the encounter.  Labs: Lab Results  Component Value Date   HGBA1C 6.0 (H) 10/07/2016   REPTSTATUS 11/22/2020 FINAL 11/16/2020   CULT  11/16/2020    NO GROWTH 5 DAYS Performed at [facility] [place] [address], [place], [place] [address]      Lab Results  Component Value Date   ALBUMIN 3.9 11/28/2020   ALBUMIN 4.2 10/06/2016    No results found for: MG No results found for: VD25OH  No results found for: PREALBUMIN CBC EXTENDED Latest Ref Rng & Units 11/28/2020 11/16/2020 12/16/2016  WBC 4.0 - 10.5 K/uL 6.2 5.1 12.9(H)  RBC 3.87 - 5.11 MIL/uL 4.68 4.30 4.65  HGB 12.0 - 15.0 g/dL 14.0 13.0 14.6  HCT 36.0 - 46.0 % 41.8 38.8 41.0  PLT 150 - 400 K/uL 234 157 198  NEUTROABS 1.7 - 7.7 K/uL 3.4 3.3 11.0(H)  LYMPHSABS 0.7 - 4.0 K/uL 2.0 1.1 1.2     There is no height or weight on file to calculate BMI.  Orders:  Orders Placed This Encounter  Procedures   XR Shoulder Left   Ambulatory referral to Physical Therapy   Meds ordered this encounter  Medications   meloxicam (MOBIC) 7.5 MG tablet    Sig: Take 1 tablet (7.5 mg total) by mouth daily.    Dispense:  60 tablet    Refill:  0     Procedures: Large Joint Inj: L subacromial bursa on 12/20/2020 11:28 AM Indications: pain Details: 22 G 1.5 in needle Medications: 5 mL lidocaine 1 %; 40 mg methylPREDNISolone acetate  40 MG/ML Consent was given by the patient.     Clinical Data: No additional findings.  ROS:  All other systems negative, except as noted in the HPI. Review of Systems  Objective: Vital Signs: There were no vitals taken for this visit.  Specialty Comments:  No specialty comments available.  PMFS History: Patient Active Problem List   Diagnosis Date Noted   Closed fracture of 9th & 10th ribs of right side 12/17/2016   Pneumothorax on right 12/17/2016   Lumbar degenerative disc disease 12/17/2016   Essential hypertension 12/17/2016   Dyslipidemia 10/07/2016   Pulmonary nodule 10/07/2016   Abnormal CXR    Chest pain 10/06/2016   S/P lumbar spinal fusion 08/05/2016   Obstructive sleep apnea syndrome, mild 10/27/2013   Controlled type 2 diabetes mellitus without complication, without  long-term current use of insulin ([place]) 06/19/2011   GAD (generalized anxiety disorder) 11/01/2009   Past Medical History:  Diagnosis Date   Adenomatous polyp    Arthritis    "mostly fingers" (10/07/2016)   At high risk for falls    Celiac disease    "I do not have the disease; 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" (10/07/2016)   Chronic back pain    "worse in my neck; lower back also affected" (10/07/2016)   Chronic cholecystitis 99991111   Complication of anesthesia    "never fully regained my taste after my cervical fusion; I don't take much RX so I'm very sensitive to any sedation" (10/07/2016)   Concussion    X2   DDD (degenerative disc disease), cervical    lumbar   Depression    "hx; take Prozac to keep me stable" (10/07/2016)   Dizziness    caused by compression of cervical disc   Family history of adverse reaction to anesthesia    "daughters get PONV; one daughter breaks out severely; grandson got suspended in a state of anxiousness after kidney surgery; had to be held for hours"   GERD (gastroesophageal reflux disease)    Hyperlipidemia    OSA on CPAP    Pneumonia    "once as an adult" (10/07/2016)   Thyroid nodule    Type II diabetes mellitus ([place])    controlled by diet and exercide    Family History  Problem Relation Age of Onset   Mitral valve prolapse Mother    Hyperlipidemia Mother    Macular degeneration Mother    Hypothyroidism Mother    Arthritis Mother    Atrial fibrillation Father    COPD Father    Osteoarthritis Sister    Hypothyroidism Sister    Hyperlipidemia Sister    Dementia Maternal Grandmother    Breast cancer Maternal Grandmother    Hypertension Maternal Grandmother    Arthritis Maternal Grandmother    Diabetes type I Maternal Grandfather    Arthritis Sister    Hypothyroidism Sister    Fibromyalgia Sister    Other Other        Osgood-schlatter    Past Surgical History:  Procedure Laterality Date   ANTERIOR CERVICAL DECOMP/DISCECTOMY FUSION  2015    BACK SURGERY     CARPAL TUNNEL RELEASE Bilateral    CATARACT EXTRACTION, BILATERAL Bilateral 09/10/2014 - 09/24/2014   by Darleen Crocker   CHOLECYSTECTOMY N/A 10/16/2016   Procedure: LAPAROSCOPIC CHOLECYSTECTOMY;  Surgeon: [name], MD;  Location: [facility];  Service: General;  Laterality: N/A;   LAPAROSCOPIC CHOLECYSTECTOMY  10/16/2016   LUMBAR [place] SURGERY  2007   TONSILLECTOMY  05/05/1983  TUBAL LIGATION  02/09/1979   Social History   Occupational History   Not on file  Tobacco Use   Smoking status: Former    Packs/day: 1.00    Years: 50.00    Pack years: 50.00    Types: Cigarettes    Quit date: 10/16/2016    Years since quitting: 4.1   Smokeless tobacco: Never   Tobacco comments:    10/16/2016 "weaned down the past 9 days; on RX now; done!!!!!!!!"  Vaping Use   Vaping Use: Never used  Substance and Sexual Activity   Alcohol use: No   Drug use: No   Sexual activity: Not Currently

## 2020-12-25 MED ORDER — LIDOCAINE HCL 1 % IJ SOLN
5.0000 mL | INTRAMUSCULAR | Status: AC | PRN
  Administered 2020-12-20: 5 mL

## 2020-12-25 MED ORDER — METHYLPREDNISOLONE ACETATE 40 MG/ML IJ SUSP
40.0000 mg | INTRAMUSCULAR | Status: AC | PRN
  Administered 2020-12-20: 40 mg via INTRA_ARTICULAR

## 2020-12-26 ENCOUNTER — Other Ambulatory Visit: Payer: Self-pay

## 2020-12-26 ENCOUNTER — Ambulatory Visit: Payer: Medicare Other | Attending: Physician Assistant | Admitting: Physical Therapy

## 2020-12-26 DIAGNOSIS — M5412 Radiculopathy, cervical region: Secondary | ICD-10-CM | POA: Diagnosis present

## 2020-12-26 DIAGNOSIS — M542 Cervicalgia: Secondary | ICD-10-CM

## 2020-12-26 NOTE — Therapy (Signed)
[facility] [address], [place], [place], [address] Phone: [phone]   Fax:  [phone]  Physical Therapy Evaluation  Patient Details  Name: Krista Austin MRN: [id number] Date of Birth: 15-Jun-1945 Referring Provider (PT): [name] PA   Encounter Date: 12/26/2020   PT End of Session - 12/26/20 1315     Visit Number 1    Date for PT Re-Evaluation 02/20/21    Authorization Type medicare A/B    Progress Note Due on Visit 10    PT Start Time 0930    PT Stop Time [id number]    PT Time Calculation (min) 45 min    Activity Tolerance Patient tolerated treatment well             Past Medical History:  Diagnosis Date   Adenomatous polyp    Arthritis    "mostly fingers" (10/07/2016)   At high risk for falls    Celiac disease    "I do not have the disease; 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" (10/07/2016)   Chronic back pain    "worse in my neck; lower back also affected" (10/07/2016)   Chronic cholecystitis 99991111   Complication of anesthesia    "never fully regained my taste after my cervical fusion; I don't take much RX so I'm very sensitive to any sedation" (10/07/2016)   Concussion    X2   DDD (degenerative disc disease), cervical    lumbar   Depression    "hx; take Prozac to keep me stable" (10/07/2016)   Dizziness    caused by compression of cervical disc   Family history of adverse reaction to anesthesia    "daughters get PONV; one daughter breaks out severely; grandson got suspended in a state of anxiousness after kidney surgery; had to be held for hours"   GERD (gastroesophageal reflux disease)    Hyperlipidemia    OSA on CPAP    Pneumonia    "once as an adult" (10/07/2016)   Thyroid nodule    Type II diabetes mellitus ([place])    controlled by diet and exercide    Past Surgical History:  Procedure Laterality Date   ANTERIOR CERVICAL DECOMP/DISCECTOMY FUSION  2015   BACK SURGERY     CARPAL TUNNEL  RELEASE Bilateral    CATARACT EXTRACTION, BILATERAL Bilateral 09/10/2014 - 09/24/2014   by [place] N/A 10/16/2016   Procedure: LAPAROSCOPIC CHOLECYSTECTOMY;  Surgeon: [name], MD;  Location: [facility];  Service: General;  Laterality: N/A;   LAPAROSCOPIC CHOLECYSTECTOMY  10/16/2016   LUMBAR [place] SURGERY  2007   TONSILLECTOMY  05/05/1983   TUBAL LIGATION  02/09/1979    There were no vitals filed for this visit.    Subjective Assessment - 12/26/20 0935     Subjective Neck and left shoulder pain. Numbness/tingling in fingers.    Vertigo with 8 falls over the past  My handwriting is getting worse.  Had Covid/pneumonia recently very sick.  Had to rest 3 weeks which helped the back/leg.    Pertinent History DM, stage III kidney; osteoporosis (mild)    Diagnostic tests MRI shows disc bulging    Patient Stated Goals do 2-3 hours of housework    Currently in Pain? Yes    Pain Score 5     Pain Location Neck    Pain Orientation Right;Left   right worse   Pain Radiating Towards left UE symptoms to wrist;  mild tingling in fingers  [facility] PT Assessment - 12/26/20 0001       Assessment   Medical Diagnosis cervical disc degeneration    Referring Provider (PT) [name] PA    Prior Therapy neck and back      Precautions   Precautions None;Fall      Restrictions   Weight Bearing Restrictions No      Balance Screen   Has the patient fallen in the past 6 months No    Has the patient had a decrease in activity level because of a fear of falling?  No    Is the patient reluctant to leave their home because of a fear of falling?  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       [facility] Adult PT Treatment/Exercise - 12/26/20 0001       Manual Therapy   Soft tissue mobilization bil cervical paraspinals, upper traps, suboccipitals              Trigger Point Dry Needling - 12/26/20 0001     Consent Given? Yes    Education Handout Provided Previously provided    Other Dry Needling bil    Upper Trapezius Response Twitch reponse elicited;Palpable increased muscle length    Suboccipitals Response Palpable increased muscle length    Cervical multifidi Response Palpable increased muscle length                    PT Short Term Goals - 12/26/20 1941       PT SHORT TERM GOAL #1   Title Pt will be independent with her initial HEP to decrease pain/modulate symptoms    Time 6    Period Weeks    Status New    Target Date 02/06/21      PT SHORT TERM GOAL #2   Title Pt will report at least 30% decrease in neck pain and UE pain/numbness.    Time 6    Period Weeks    Status New      PT SHORT TERM GOAL #3   Title Nexk Disability Index improved to 38%    Time 6    Period Weeks    Status New      PT SHORT TERM GOAL #4   Title Cervical flexion improved to 40 degrees and extension to 50 degrees needed for grooming/dressing tasks    Time 6    Period Weeks    Status New  PT Long Term Goals - 12/26/20 1944       PT LONG TERM GOAL #1   Title The patient will be independent in advance HEP for self management and further improvement of  symptoms    Time 12    Period Weeks    Status New    Target Date 03/20/21      PT LONG TERM GOAL #2   Title Pt will report atleast 50% improvement in neck and UE paresthesia with daily activity.    Time 12    Period Weeks    Status New      PT LONG TERM GOAL #3   Title Improved cervical sidebending ROM improved to 40 degrees and rotation to 45 degrees needed for driving    Time 12    Period Weeks    Status New      PT LONG TERM GOAL #4   Title Neck Disability Index improved to 30% indicating improved function with less pain    Time 12    Period Weeks    Status New      PT LONG TERM GOAL #5   Title Cervical and periscapular strength improved to 4+/5 needed for lifting/ carrying bags    Time 12    Period Weeks                    Plan - 12/26/20 1315     Clinical Impression Statement The patient has a history of neck pain, headache and vertigo but reports a recent worsening after recovering from being very sick with Covid/pneumonia recently.  She also reports left UE parethesia in her fingers. She has limited ROM in all planes: flexion 30 degrees, extension 45 degrees, right sidebend 28 degrees, left 34, right rotation 40 degrees and left rotation 30 degrees.  Spasm and tender points noted in bil upper traps, cervical paraspinals and suboccipitals.  Decreased cervical and periscapular strength grossly 4/5.  Neck Disability Index at 46% impairment.  She would benefit from PT to address these deficits    Personal Factors and Comorbidities Age;Comorbidity 2;Time since onset of injury/illness/exacerbation;Comorbidity 1    Comorbidities DM; CKD; carpal tunnel    Examination-Activity Limitations Caring for Others;Carry;Lift;Bend;Squat    Examination-Participation Restrictions Cleaning;Community Activity;Meal Prep    Stability/Clinical Decision Making Stable/Uncomplicated    Clinical Decision Making Low    Rehab Potential Good    PT Frequency 1x / week    PT Duration 12  weeks    PT Treatment/Interventions ADLs/Self Care Home Management;Biofeedback;Cryotherapy;Electrical Stimulation;Moist Heat;Traction;Therapeutic activities;Therapeutic exercise;Neuromuscular re-education;Patient/family education;Manual techniques;Passive range of motion;Dry needling;Taping;Aquatic Therapy;Ultrasound    PT Next Visit Plan assess response to DN#1 to bil cervical region;  manual therapy;  neck ROM ex's; check grip strength and neural tension    PT Home Exercise Plan Access Code: [email or web address]    Recommended Other Services order received for left shoulder pain--different referring doctor    Consulted and Agree with Plan of Care Patient             Patient will benefit from skilled therapeutic intervention in order to improve the following deficits and impairments:  Pain, Increased fascial restricitons, Decreased strength, Decreased activity tolerance, Decreased range of motion, Increased muscle spasms, Decreased endurance, Impaired UE functional use  Visit Diagnosis: Cervicalgia - Plan: PT plan of care cert/re-cert  Radiculopathy, cervical region - Plan: PT plan of care cert/re-cert     Problem List Patient Active Problem List   Diagnosis Date Noted  Closed fracture of 9th & 10th ribs of right side 12/17/2016   Pneumothorax on right 12/17/2016   Lumbar degenerative disc disease 12/17/2016   Essential hypertension 12/17/2016   Dyslipidemia 10/07/2016   Pulmonary nodule 10/07/2016   Abnormal CXR    Chest pain 10/06/2016   S/P lumbar spinal fusion 08/05/2016   Obstructive sleep apnea syndrome, mild 10/27/2013   Controlled type 2 diabetes mellitus without complication, without long-term current use of insulin ([place]) 06/19/2011   GAD (generalized anxiety disorder) 11/01/2009   Ruben Im, PT 12/26/20 7:52 PM Phone: [phone] Fax: [phone]  Alvera Singh 12/26/2020, 7:52 PM  [facility] [address] [place], [place], [address] Phone: [phone]   Fax:  [phone]  Name: Krista Austin MRN: [id number] Date of Birth: Jun 12, 1945

## 2021-01-02 ENCOUNTER — Ambulatory Visit: Payer: Medicare Other | Attending: Physician Assistant | Admitting: Physical Therapy

## 2021-01-02 ENCOUNTER — Other Ambulatory Visit: Payer: Self-pay

## 2021-01-02 DIAGNOSIS — M545 Low back pain, unspecified: Secondary | ICD-10-CM | POA: Insufficient documentation

## 2021-01-02 DIAGNOSIS — M6281 Muscle weakness (generalized): Secondary | ICD-10-CM | POA: Insufficient documentation

## 2021-01-02 DIAGNOSIS — G8929 Other chronic pain: Secondary | ICD-10-CM | POA: Diagnosis present

## 2021-01-02 DIAGNOSIS — M542 Cervicalgia: Secondary | ICD-10-CM | POA: Insufficient documentation

## 2021-01-02 DIAGNOSIS — M5412 Radiculopathy, cervical region: Secondary | ICD-10-CM

## 2021-01-02 NOTE — Therapy (Signed)
[facility] [address], [place], [place], [address] Phone: [phone]   Fax:  [phone]  Physical Therapy Treatment  Patient Details  Name: Krista Austin MRN: [id number] Date of Birth: [birth year] Referring Provider (PT): [name] PA   Encounter Date: 01/02/2021   PT End of Session - 01/02/21 1130     Visit Number 2    Date for PT Re-Evaluation 02/20/21    Authorization Type medicare A/B    Progress Note Due on Visit 10    PT Start Time [phone]    PT Stop Time 1020    PT Time Calculation (min) 42 min    Activity Tolerance Patient tolerated treatment well             Past Medical History:  Diagnosis Date   Adenomatous polyp    Arthritis    "mostly fingers" (10/07/2016)   At high risk for falls    Celiac disease    "I do not have the disease; 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" (10/07/2016)   Chronic back pain    "worse in my neck; lower back also affected" (10/07/2016)   Chronic cholecystitis 99991111   Complication of anesthesia    "never fully regained my taste after my cervical fusion; I don't take much RX so I'm very sensitive to any sedation" (10/07/2016)   Concussion    X2   DDD (degenerative disc disease), cervical    lumbar   Depression    "hx; take Prozac to keep me stable" (10/07/2016)   Dizziness    caused by compression of cervical disc   Family history of adverse reaction to anesthesia    "daughters get PONV; one daughter breaks out severely; grandson got suspended in a state of anxiousness after kidney surgery; had to be held for hours"   GERD (gastroesophageal reflux disease)    Hyperlipidemia    OSA on CPAP    Pneumonia    "once as an adult" (10/07/2016)   Thyroid nodule    Type II diabetes mellitus ([place])    controlled by diet and exercide    Past Surgical History:  Procedure Laterality Date   ANTERIOR CERVICAL DECOMP/DISCECTOMY FUSION  2015   BACK SURGERY     CARPAL TUNNEL RELEASE  Bilateral    CATARACT EXTRACTION, BILATERAL Bilateral 09/10/2014 - 09/24/2014   by [place] N/A 10/16/2016   Procedure: LAPAROSCOPIC CHOLECYSTECTOMY;  Surgeon: [name], MD;  Location: [place];  Service: General;  Laterality: N/A;   LAPAROSCOPIC CHOLECYSTECTOMY  10/16/2016   LUMBAR [place] SURGERY  2007   TONSILLECTOMY  05/05/1983   TUBAL LIGATION  02/09/1979    There were no vitals filed for this visit.   Subjective Assessment - 01/02/21 1119     Subjective I haven't slept in 2 nights.  It hurts on both sides now.  Pretty constant left UE numbness/tingling.  I think I'm going to follow up with the doctor about this.  Some benefit from DN but not the miracle that occured before.    Patient is accompained by: Family member   son [name]   Pertinent History DM, stage III kidney; osteoporosis (mild)    Currently in Pain? Yes    Pain Score 5     Pain Location Neck    Pain Orientation Right;Left  [facility] Adult PT Treatment/Exercise - 01/02/21 0001       Neck Exercises: Supine   Other Supine Exercise pool noodle behind neck rotations right/left 7x    Other Supine Exercise pool noodle behind neck with nods 7x      Moist Heat Therapy   Number Minutes Moist Heat 3 Minutes    Moist Heat Location Cervical      Manual Therapy   Manual therapy comments suboccipital release 3 min    Joint Mobilization left lateral mobs C6-C3 grade 3; PA mobs grade 3 C4-C7 20 sec each level    Soft tissue mobilization bil cervical paraspinals, upper traps, suboccipitals    Manual Traction 30 sec              Trigger Point Dry Needling - 01/02/21 0001     Consent Given? Yes    Education Handout Provided Previously provided    Other Dry Needling bil    Upper Trapezius Response Twitch reponse elicited;Palpable increased muscle length    Suboccipitals Response Palpable increased muscle length    Cervical multifidi Response  Palpable increased muscle length                    PT Short Term Goals - 12/26/20 1941       PT SHORT TERM GOAL #1   Title Pt will be independent with her initial HEP to decrease pain/modulate symptoms    Time 6    Period Weeks    Status New    Target Date 02/06/21      PT SHORT TERM GOAL #2   Title Pt will report at least 30% decrease in neck pain and UE pain/numbness.    Time 6    Period Weeks    Status New      PT SHORT TERM GOAL #3   Title Nexk Disability Index improved to 38%    Time 6    Period Weeks    Status New      PT SHORT TERM GOAL #4   Title Cervical flexion improved to 40 degrees and extension to 50 degrees needed for grooming/dressing tasks    Time 6    Period Weeks    Status New               PT Long Term Goals - 12/26/20 1944       PT LONG TERM GOAL #1   Title The patient will be independent in advance HEP for self management and further improvement of symptoms    Time 12    Period Weeks    Status New    Target Date 03/20/21      PT LONG TERM GOAL #2   Title Pt will report atleast 50% improvement in neck and UE paresthesia with daily activity.    Time 12    Period Weeks    Status New      PT LONG TERM GOAL #3   Title Improved cervical sidebending ROM improved to 40 degrees and rotation to 45 degrees needed for driving    Time 12    Period Weeks    Status New      PT LONG TERM GOAL #4   Title Neck Disability Index improved to 30% indicating improved function with less pain    Time 12    Period Weeks    Status New      PT LONG TERM GOAL #5   Title Cervical and periscapular strength improved  to 4+/5 needed for lifting/ carrying bags    Time 12    Period Weeks                   Plan - 01/02/21 1131     Clinical Impression Statement The patient reports left > right neck pain and left UE symptoms affecting sleep.  She has multiple tender points in cervical musculature which responded well to DN.  Good response  to manual distraction,  suboccipital release and cervical joint mobs on neck pain but no change in UE paresthesia.    She did well with using the pool noodle (has one at home) to do cervical rotation and flexion/extension mobility exs.  Therapist closely monitoring response throughout treatment session.    Personal Factors and Comorbidities Age;Comorbidity 2;Time since onset of injury/illness/exacerbation;Comorbidity 1    Comorbidities DM; CKD; carpal tunnel    Rehab Potential Good    PT Frequency 1x / week    PT Duration 12 weeks    PT Treatment/Interventions ADLs/Self Care Home Management;Biofeedback;Cryotherapy;Electrical Stimulation;Moist Heat;Traction;Therapeutic activities;Therapeutic exercise;Neuromuscular re-education;Patient/family education;Manual techniques;Passive range of motion;Dry needling;Taping;Aquatic Therapy;Ultrasound    PT Next Visit Plan assess response to DN#2 to bil cervical region;  manual therapy;  neck ROM ex's; check grip strength and neural tension    PT Home Exercise Plan Access Code: [facility]             Patient will benefit from skilled therapeutic intervention in order to improve the following deficits and impairments:  Pain, Increased fascial restricitons, Decreased strength, Decreased activity tolerance, Decreased range of motion, Increased muscle spasms, Decreased endurance, Impaired UE functional use  Visit Diagnosis: Cervicalgia  Radiculopathy, cervical region     Problem List Patient Active Problem List   Diagnosis Date Noted   Closed fracture of 9th & 10th ribs of right side 12/17/2016   Pneumothorax on right 12/17/2016   Lumbar degenerative disc disease 12/17/2016   Essential hypertension 12/17/2016   Dyslipidemia 10/07/2016   Pulmonary nodule 10/07/2016   Abnormal CXR    Chest pain 10/06/2016   S/P lumbar spinal fusion 08/05/2016   Obstructive sleep apnea syndrome, mild 10/27/2013   Controlled type 2 diabetes mellitus without  complication, without long-term current use of insulin ([place]) 06/19/2011   GAD (generalized anxiety disorder) 11/01/2009   Ruben Im, PT 01/02/21 11:39 AM Phone: [phone] Fax: [phone]  Alvera Singh 01/02/2021, 11:38 AM  [facility] [address] [place], [place], [address] Phone: [phone]   Fax:  [phone]  Name: Krista Austin MRN: [id number] Date of Birth: 09/01/1945

## 2021-01-03 ENCOUNTER — Ambulatory Visit (INDEPENDENT_AMBULATORY_CARE_PROVIDER_SITE_OTHER): Payer: Medicare Other | Admitting: Family

## 2021-01-03 ENCOUNTER — Ambulatory Visit: Payer: Self-pay

## 2021-01-03 ENCOUNTER — Encounter: Payer: Self-pay | Admitting: Family

## 2021-01-03 DIAGNOSIS — M542 Cervicalgia: Secondary | ICD-10-CM

## 2021-01-03 MED ORDER — PREDNISONE 50 MG PO TABS: ORAL_TABLET | ORAL | 0 refills | Status: DC

## 2021-01-03 NOTE — Progress Notes (Signed)
Office Visit Note   Patient: Krista Austin           Date of Birth: [birth year]           MRN: [id number] Visit Date: 01/03/2021              Requested by: Doyle Askew, PA-C [address] [place],  [place] [address] PCP: Doyle Askew, PA-C  No chief complaint on file.     HPI: The patient is a 75 year old woman who presents today for initial evaluation of worsening of her chronic neck pain.  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  She has also been in physical therapy for her lumbar spine and her neck for the last 8 months.  In the last few days she has had an acute worsening of her neck pain with radicular symptoms down her left arm dull constant pain.  Heaviness.  Denies any weakness.  There is no associated injury  She does typically follow with Dr. Saintclair Halsted  Today she is concerned she may have had some worsening. is requesting radiographs.  Assessment & Plan: Visit Diagnoses:  1. Cervicalgia     Plan: radiographs without acute finding. Will trial a prednisone burst. If no improvement have recommended she be seen by Dr. Saintclair Halsted.   Follow-Up Instructions: No follow-ups on file.   Back Exam   Tenderness  The patient is experiencing no tenderness.   Comments:  Pain with flexion, extension, rom of neck     Patient is alert, oriented, no adenopathy, well-dressed, normal affect, normal respiratory effort.   Imaging: No results found. No images are attached to the encounter.  Labs: Lab Results  Component Value Date   HGBA1C 6.0 (H) 10/07/2016   REPTSTATUS 11/22/2020 FINAL 11/16/2020   CULT  11/16/2020    NO GROWTH 5 DAYS Performed at [facility], [place] [address], [place], [place] [address]      Lab Results  Component Value Date   ALBUMIN 3.9 11/28/2020   ALBUMIN 4.2 10/06/2016    No results found for: MG No results found for: VD25OH  No results found  for: PREALBUMIN CBC EXTENDED Latest Ref Rng & Units 11/28/2020 11/16/2020 12/16/2016  WBC 4.0 - 10.5 K/uL 6.2 5.1 12.9(H)  RBC 3.87 - 5.11 MIL/uL 4.68 4.30 4.65  HGB 12.0 - 15.0 g/dL 14.0 13.0 14.6  HCT 36.0 - 46.0 % 41.8 38.8 41.0  PLT 150 - 400 K/uL 234 157 198  NEUTROABS 1.7 - 7.7 K/uL 3.4 3.3 11.0(H)  LYMPHSABS 0.7 - 4.0 K/uL 2.0 1.1 1.2     There is no height or weight on file to calculate BMI.  Orders:  Orders Placed This Encounter  Procedures   XR Cervical Spine 2 or 3 views   No orders of the defined types were placed in this encounter.    Procedures: No procedures performed  Clinical Data: No additional findings.  ROS:  All other systems negative, except as noted in the HPI. Review of Systems  Constitutional:  Negative for chills and fever.  Musculoskeletal:  Positive for neck pain. Negative for myalgias and neck stiffness.  Neurological:  Positive for weakness and numbness.   Objective: Vital Signs: There were no vitals taken for this visit.  Specialty Comments:  No specialty comments available.  PMFS History: Patient Active Problem List   Diagnosis Date Noted  Closed fracture of 9th & 10th ribs of right side 12/17/2016   Pneumothorax on right 12/17/2016   Lumbar degenerative disc disease 12/17/2016   Essential hypertension 12/17/2016   Dyslipidemia 10/07/2016   Pulmonary nodule 10/07/2016   Abnormal CXR    Chest pain 10/06/2016   S/P lumbar spinal fusion 08/05/2016   Obstructive sleep apnea syndrome, mild 10/27/2013   Controlled type 2 diabetes mellitus without complication, without long-term current use of insulin ([place]) 06/19/2011   GAD (generalized anxiety disorder) 11/01/2009   Past Medical History:  Diagnosis Date   Adenomatous polyp    Arthritis    "mostly fingers" (10/07/2016)   At high risk for falls    Celiac disease    "I do not have the disease; 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" (10/07/2016)   Chronic back pain    "worse in my neck; lower  back also affected" (10/07/2016)   Chronic cholecystitis 99991111   Complication of anesthesia    "never fully regained my taste after my cervical fusion; I don't take much RX so I'm very sensitive to any sedation" (10/07/2016)   Concussion    X2   DDD (degenerative disc disease), cervical    lumbar   Depression    "hx; take Prozac to keep me stable" (10/07/2016)   Dizziness    caused by compression of cervical disc   Family history of adverse reaction to anesthesia    "daughters get PONV; one daughter breaks out severely; grandson got suspended in a state of anxiousness after kidney surgery; had to be held for hours"   GERD (gastroesophageal reflux disease)    Hyperlipidemia    OSA on CPAP    Pneumonia    "once as an adult" (10/07/2016)   Thyroid nodule    Type II diabetes mellitus ([place])    controlled by diet and exercide    Family History  Problem Relation Age of Onset   Mitral valve prolapse Mother    Hyperlipidemia Mother    Macular degeneration Mother    Hypothyroidism Mother    Arthritis Mother    Atrial fibrillation Father    COPD Father    Osteoarthritis Sister    Hypothyroidism Sister    Hyperlipidemia Sister    Dementia Maternal Grandmother    Breast cancer Maternal Grandmother    Hypertension Maternal Grandmother    Arthritis Maternal Grandmother    Diabetes type I Maternal Grandfather    Arthritis Sister    Hypothyroidism Sister    Fibromyalgia Sister    Other Other        [place]sgood-schlatter    Past Surgical History:  Procedure Laterality Date   ANTERIOR CERVICAL DECOMP/DISCECTOMY FUSION  2015   BACK SURGERY     CARPAL TUNNEL RELEASE Bilateral    CATARACT EXTRACTION, BILATERAL Bilateral 09/10/2014 - 09/24/2014   by [place] N/A 10/16/2016   Procedure: LAPAROSCOPIC CHOLECYSTECTOMY;  Surgeon: [name], MD;  Location: [facility];  Service: General;  Laterality: N/A;   LAPAROSCOPIC CHOLECYSTECTOMY  10/16/2016   LUMBAR [place] SURGERY  2007    TONSILLECTOMY  05/05/1983   TUBAL LIGATION  02/09/1979   Social History   Occupational History   Not on file  Tobacco Use   Smoking status: Former    Packs/day: 1.00    Years: 50.00    Pack years: 50.00    Types: Cigarettes    Quit date: 10/16/2016    Years since quitting: 4.2   Smokeless tobacco: Never   Tobacco  comments:    10/16/2016 "weaned down the past 9 days; on RX now; done!!!!!!!!"  Vaping Use   Vaping Use: Never used  Substance and Sexual Activity   Alcohol use: No   Drug use: No   Sexual activity: Not Currently

## 2021-01-09 ENCOUNTER — Other Ambulatory Visit: Payer: Self-pay

## 2021-01-09 ENCOUNTER — Ambulatory Visit: Payer: Medicare Other | Admitting: Physical Therapy

## 2021-01-09 DIAGNOSIS — M5412 Radiculopathy, cervical region: Secondary | ICD-10-CM

## 2021-01-09 DIAGNOSIS — M542 Cervicalgia: Secondary | ICD-10-CM

## 2021-01-09 NOTE — Therapy (Signed)
[facility] [address], [place], [place], [address] Phone: [phone]   Fax:  [phone]  Physical Therapy Treatment  Patient Details  Name: Krista Austin MRN: [id number] Date of Birth: 11-20-1945 Referring Provider (PT): [name] PA   Encounter Date: 01/09/2021   PT End of Session - 01/09/21 1134     Visit Number 3    Date for PT Re-Evaluation 02/20/21    Authorization Type medicare A/B    Progress Note Due on Visit 10    PT Start Time 1104    PT Stop Time 1145    PT Time Calculation (min) 41 min    Activity Tolerance Patient tolerated treatment well             Past Medical History:  Diagnosis Date   Adenomatous polyp    Arthritis    "mostly fingers" (10/07/2016)   At high risk for falls    Celiac disease    "I do not have the disease; 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" (10/07/2016)   Chronic back pain    "worse in my neck; lower back also affected" (10/07/2016)   Chronic cholecystitis 99991111   Complication of anesthesia    "never fully regained my taste after my cervical fusion; I don't take much RX so I'm very sensitive to any sedation" (10/07/2016)   Concussion    X2   DDD (degenerative disc disease), cervical    lumbar   Depression    "hx; take Prozac to keep me stable" (10/07/2016)   Dizziness    caused by compression of cervical disc   Family history of adverse reaction to anesthesia    "daughters get PONV; one daughter breaks out severely; grandson got suspended in a state of anxiousness after kidney surgery; had to be held for hours"   GERD (gastroesophageal reflux disease)    Hyperlipidemia    OSA on CPAP    Pneumonia    "once as an adult" (10/07/2016)   Thyroid nodule    Type II diabetes mellitus ([place])    controlled by diet and exercide    Past Surgical History:  Procedure Laterality Date   ANTERIOR CERVICAL DECOMP/DISCECTOMY FUSION  2015   BACK SURGERY     CARPAL TUNNEL RELEASE  Bilateral    CATARACT EXTRACTION, BILATERAL Bilateral 09/10/2014 - 09/24/2014   by [place] N/A 10/16/2016   Procedure: LAPAROSCOPIC CHOLECYSTECTOMY;  Surgeon: [name], MD;  Location: [facility];  Service: General;  Laterality: N/A;   LAPAROSCOPIC CHOLECYSTECTOMY  10/16/2016   LUMBAR [place] SURGERY  2007   TONSILLECTOMY  05/05/1983   TUBAL LIGATION  02/09/1979    There were no vitals filed for this visit.   Subjective Assessment - 01/09/21 1105     Subjective Just finished steroid dose pack.  Left UE paresthesia, dropping things.  Less dizziness.  Right neck pain.  Orthopedist may refer for MRI.  DN helped with the intense pain.    Pertinent History DM, stage III kidney; osteoporosis (mild)    Currently in Pain? Yes    Pain Score 3     Pain Location Neck    Pain Orientation Right;Left    Pain Type Chronic pain                               [facility] Adult PT Treatment/Exercise - 01/09/21 0001       Neuro Re-ed  Neuro Re-ed Details  patient education on radiculopathy pattern and presentation      Traction   Type of Traction Cervical    Min (lbs) 10    Max (lbs) 18    Hold Time 45    Rest Time 15    Time 13      Manual Therapy   Soft tissue mobilization bil cervical paraspinals, upper traps, suboccipitals              Trigger Point Dry Needling - 01/09/21 0001     Consent Given? Yes    Education Handout Provided Previously provided    Other Dry Needling bil    Upper Trapezius Response Twitch reponse elicited;Palpable increased muscle length    Suboccipitals Response Palpable increased muscle length    Cervical multifidi Response Palpable increased muscle length                     PT Short Term Goals - 12/26/20 1941       PT SHORT TERM GOAL #1   Title Pt will be independent with her initial HEP to decrease pain/modulate symptoms    Time 6    Period Weeks    Status New    Target Date 02/06/21      PT  SHORT TERM GOAL #2   Title Pt will report at least 30% decrease in neck pain and UE pain/numbness.    Time 6    Period Weeks    Status New      PT SHORT TERM GOAL #3   Title Nexk Disability Index improved to 38%    Time 6    Period Weeks    Status New      PT SHORT TERM GOAL #4   Title Cervical flexion improved to 40 degrees and extension to 50 degrees needed for grooming/dressing tasks    Time 6    Period Weeks    Status New               PT Long Term Goals - 12/26/20 1944       PT LONG TERM GOAL #1   Title The patient will be independent in advance HEP for self management and further improvement of symptoms    Time 12    Period Weeks    Status New    Target Date 03/20/21      PT LONG TERM GOAL #2   Title Pt will report atleast 50% improvement in neck and UE paresthesia with daily activity.    Time 12    Period Weeks    Status New      PT LONG TERM GOAL #3   Title Improved cervical sidebending ROM improved to 40 degrees and rotation to 45 degrees needed for driving    Time 12    Period Weeks    Status New      PT LONG TERM GOAL #4   Title Neck Disability Index improved to 30% indicating improved function with less pain    Time 12    Period Weeks    Status New      PT LONG TERM GOAL #5   Title Cervical and periscapular strength improved to 4+/5 needed for lifting/ carrying bags    Time 12    Period Weeks                   Plan - 01/09/21 1138     Clinical Impression Statement Improvement in  intensity  of symptoms since last visit but left UE radiculopathy in C-6 distribution still present.  Initiated trial of mechanical traction to relieve distal symptoms.  She reports increased neck discomfort toward the end of the traction time but still feels optimistic about it helping her UE paresthesia.    Decreased tender points noted in upper traps but soft tissue restriction present in cervical paraspinals and suboccipitals.  Therapist monitoring  response throughout treatment session.    Comorbidities DM; CKD; carpal tunnel    Examination-Activity Limitations Caring for Others;Carry;Lift;Bend;Squat    PT Frequency 1x / week    PT Duration 12 weeks    PT Treatment/Interventions ADLs/Self Care Home Management;Biofeedback;Cryotherapy;Electrical Stimulation;Moist Heat;Traction;Therapeutic activities;Therapeutic exercise;Neuromuscular re-education;Patient/family education;Manual techniques;Passive range of motion;Dry needling;Taping;Aquatic Therapy;Ultrasound    PT Next Visit Plan assess response to cervical traction; shorten traction time;   assess response to DN#3 to bil cervical region;  manual therapy;  neck ROM ex's    PT Home Exercise Plan Access Code: [facility]             Patient will benefit from skilled therapeutic intervention in order to improve the following deficits and impairments:  Pain, Increased fascial restricitons, Decreased strength, Decreased activity tolerance, Decreased range of motion, Increased muscle spasms, Decreased endurance, Impaired UE functional use  Visit Diagnosis: Cervicalgia  Radiculopathy, cervical region     Problem List Patient Active Problem List   Diagnosis Date Noted   Closed fracture of 9th & 10th ribs of right side 12/17/2016   Pneumothorax on right 12/17/2016   Lumbar degenerative disc disease 12/17/2016   Essential hypertension 12/17/2016   Dyslipidemia 10/07/2016   Pulmonary nodule 10/07/2016   Abnormal CXR    Chest pain 10/06/2016   S/P lumbar spinal fusion 08/05/2016   Obstructive sleep apnea syndrome, mild 10/27/2013   Controlled type 2 diabetes mellitus without complication, without long-term current use of insulin ([place]) 06/19/2011   GAD (generalized anxiety disorder) 11/01/2009   Ruben Im, PT 01/09/21 2:21 PM Phone: [phone] Fax: [phone]  Alvera Singh, PT 01/09/2021, 2:20 PM  [facility] [address] [place], [place], [address] Phone: [phone]   Fax:  [phone]  Name: Krista Austin MRN: [id number] Date of Birth: November 12, 1945

## 2021-01-16 ENCOUNTER — Other Ambulatory Visit: Payer: Self-pay

## 2021-01-16 ENCOUNTER — Ambulatory Visit: Payer: Medicare Other | Admitting: Physical Therapy

## 2021-01-16 DIAGNOSIS — M545 Low back pain, unspecified: Secondary | ICD-10-CM

## 2021-01-16 DIAGNOSIS — G8929 Other chronic pain: Secondary | ICD-10-CM

## 2021-01-16 DIAGNOSIS — M542 Cervicalgia: Secondary | ICD-10-CM | POA: Diagnosis not present

## 2021-01-16 DIAGNOSIS — M6281 Muscle weakness (generalized): Secondary | ICD-10-CM

## 2021-01-16 DIAGNOSIS — M5412 Radiculopathy, cervical region: Secondary | ICD-10-CM

## 2021-01-16 NOTE — Therapy (Signed)
[facility] [address], [place], [place], [address] Phone: [phone]   Fax:  [phone]  Physical Therapy Treatment  Patient Details  Name: Krista Austin MRN: [id number] Date of Birth: Nov 17, 1945 Referring Provider (PT): [name] PA   Encounter Date: 01/16/2021   PT End of Session - 01/16/21 1214     Visit Number 4    Date for PT Re-Evaluation 02/20/21    Authorization Type medicare A/B    Progress Note Due on Visit 10    PT Start Time 1100    PT Stop Time 1140    PT Time Calculation (min) 40 min    Activity Tolerance Patient tolerated treatment well             Past Medical History:  Diagnosis Date   Adenomatous polyp    Arthritis    "mostly fingers" (10/07/2016)   At high risk for falls    Celiac disease    "I do not have the disease; 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" (10/07/2016)   Chronic back pain    "worse in my neck; lower back also affected" (10/07/2016)   Chronic cholecystitis [id number]   Complication of anesthesia    "never fully regained my taste after my cervical fusion; I don't take much RX so I'm very sensitive to any sedation" (10/07/2016)   Concussion    X2   DDD (degenerative disc disease), cervical    lumbar   Depression    "hx; take Prozac to keep me stable" (10/07/2016)   Dizziness    caused by compression of cervical disc   Family history of adverse reaction to anesthesia    "daughters get PONV; one daughter breaks out severely; grandson got suspended in a state of anxiousness after kidney surgery; had to be held for hours"   GERD (gastroesophageal reflux disease)    Hyperlipidemia    OSA on CPAP    Pneumonia    "once as an adult" (10/07/2016)   Thyroid nodule    Type II diabetes mellitus ([place])    controlled by diet and exercide    Past Surgical History:  Procedure Laterality Date   ANTERIOR CERVICAL DECOMP/DISCECTOMY FUSION  2015   BACK SURGERY     CARPAL TUNNEL RELEASE  Bilateral    CATARACT EXTRACTION, BILATERAL Bilateral 09/10/2014 - 09/24/2014   by [place] N/A 10/16/2016   Procedure: LAPAROSCOPIC CHOLECYSTECTOMY;  Surgeon: [name], MD;  Location: [facility];  Service: General;  Laterality: N/A;   LAPAROSCOPIC CHOLECYSTECTOMY  10/16/2016   LUMBAR [place] SURGERY  2007   TONSILLECTOMY  05/05/1983   TUBAL LIGATION  02/09/1979    There were no vitals filed for this visit.   Subjective Assessment - 01/16/21 1057     Subjective I'm seeing some improvement.  It's been a good week.  She feels DN of lumbar region would also be helpful and will consult her medical provider to obtain an order.    Pertinent History DM, stage III kidney; osteoporosis (mild)    How long can you walk comfortably? 15-20 minutes (initially only 1/2 mile)  50 laps .8 mile    Diagnostic tests MRI shows disc bulging    Patient Stated Goals do 2-3 hours of housework    Currently in Pain? Yes    Pain Score 3     Pain Location Neck  [place] Adult PT Treatment/Exercise - 01/16/21 0001       Moist Heat Therapy   Number Minutes Moist Heat 5 Minutes    Moist Heat Location Cervical      Traction   Type of Traction Cervical    Min (lbs) 10    Max (lbs) 18    Hold Time 45    Rest Time 15    Time 8      Manual Therapy   Soft tissue mobilization bil cervical paraspinals, upper traps, suboccipitals              Trigger Point Dry Needling - 01/16/21 0001     Consent Given? Yes    Education Handout Provided Previously provided    Other Dry Needling bil    Upper Trapezius Response Twitch reponse elicited;Palpable increased muscle length    Suboccipitals Response Palpable increased muscle length    Cervical multifidi Response Palpable increased muscle length                     PT Short Term Goals - 12/26/20 1941       PT SHORT TERM GOAL #1   Title Pt will be independent with her initial HEP  to decrease pain/modulate symptoms    Time 6    Period Weeks    Status New    Target Date 02/06/21      PT SHORT TERM GOAL #2   Title Pt will report at least 30% decrease in neck pain and UE pain/numbness.    Time 6    Period Weeks    Status New      PT SHORT TERM GOAL #3   Title Nexk Disability Index improved to 38%    Time 6    Period Weeks    Status New      PT SHORT TERM GOAL #4   Title Cervical flexion improved to 40 degrees and extension to 50 degrees needed for grooming/dressing tasks    Time 6    Period Weeks    Status New               PT Long Term Goals - 12/26/20 1944       PT LONG TERM GOAL #1   Title The patient will be independent in advance HEP for self management and further improvement of symptoms    Time 12    Period Weeks    Status New    Target Date 03/20/21      PT LONG TERM GOAL #2   Title Pt will report atleast 50% improvement in neck and UE paresthesia with daily activity.    Time 12    Period Weeks    Status New      PT LONG TERM GOAL #3   Title Improved cervical sidebending ROM improved to 40 degrees and rotation to 45 degrees needed for driving    Time 12    Period Weeks    Status New      PT LONG TERM GOAL #4   Title Neck Disability Index improved to 30% indicating improved function with less pain    Time 12    Period Weeks    Status New      PT LONG TERM GOAL #5   Title Cervical and periscapular strength improved to 4+/5 needed for lifting/ carrying bags    Time 12    Period Weeks  Plan - 01/16/21 1215     Clinical Impression Statement The patient has multiple tender points today in cervical multifidi and upper traps.  Improved soft tissue mobility noted after DN and manual therapy.  She reports headache production with cervical traction at 8 minute mark.  She states when she had traction in the past, she had to work up to longer periods of time secondary a similar issue.  Therapist monitoring  response throughout session and modifying treatment base on response.    Personal Factors and Comorbidities Age;Comorbidity 2;Time since onset of injury/illness/exacerbation;Comorbidity 1    Comorbidities DM; CKD; carpal tunnel    Examination-Activity Limitations Caring for Others;Carry;Lift;Bend;Squat    Rehab Potential Good    PT Frequency 1x / week    PT Duration 12 weeks    PT Treatment/Interventions ADLs/Self Care Home Management;Biofeedback;Cryotherapy;Electrical Stimulation;Moist Heat;Traction;Therapeutic activities;Therapeutic exercise;Neuromuscular re-education;Patient/family education;Manual techniques;Passive range of motion;Dry needling;Taping;Aquatic Therapy;Ultrasound    PT Next Visit Plan shorten traction time < 8 min and possibly lighter pull/less hold time;   DN to bil cervical region;  manual therapy;  neck ROM ex's    PT Home Exercise Plan Access Code: [facility]             Patient will benefit from skilled therapeutic intervention in order to improve the following deficits and impairments:  Pain, Increased fascial restricitons, Decreased strength, Decreased activity tolerance, Decreased range of motion, Increased muscle spasms, Decreased endurance, Impaired UE functional use  Visit Diagnosis: Cervicalgia  Radiculopathy, cervical region  Chronic bilateral low back pain, unspecified whether sciatica present  Muscle weakness (generalized)     Problem List Patient Active Problem List   Diagnosis Date Noted   Closed fracture of 9th & 10th ribs of right side 12/17/2016   Pneumothorax on right 12/17/2016   Lumbar degenerative disc disease 12/17/2016   Essential hypertension 12/17/2016   Dyslipidemia 10/07/2016   Pulmonary nodule 10/07/2016   Abnormal CXR    Chest pain 10/06/2016   S/P lumbar spinal fusion 08/05/2016   Obstructive sleep apnea syndrome, mild 10/27/2013   Controlled type 2 diabetes mellitus without complication, without long-term current use of  insulin ([place]) 06/19/2011   GAD (generalized anxiety disorder) 11/01/2009   Ruben Im, PT 01/16/21 12:24 PM Phone: [phone] Fax: [phone]  Alvera Singh, PT 01/16/2021, 12:24 PM  [facility] [address] [place], [place], [address] Phone: [phone]   Fax:  [phone]  Name: Krista Austin MRN: [id number] Date of Birth: 25-Jan-1946

## 2021-01-17 ENCOUNTER — Ambulatory Visit: Payer: Medicare Other | Admitting: Family

## 2021-01-23 ENCOUNTER — Other Ambulatory Visit: Payer: Self-pay

## 2021-01-23 ENCOUNTER — Ambulatory Visit: Payer: Medicare Other | Admitting: Physical Therapy

## 2021-01-23 DIAGNOSIS — M542 Cervicalgia: Secondary | ICD-10-CM | POA: Diagnosis not present

## 2021-01-23 DIAGNOSIS — M5412 Radiculopathy, cervical region: Secondary | ICD-10-CM

## 2021-01-23 NOTE — Therapy (Signed)
[facility] [address], [place], [place], [address] Phone: [phone]   Fax:  [phone]  Physical Therapy Treatment  Patient Details  Name: Krista Austin MRN: [id number] Date of Birth: Jul 28, 1945 Referring Provider (PT): [name] PA   Encounter Date: 01/23/2021   PT End of Session - 01/23/21 1322     Visit Number 5    Date for PT Re-Evaluation 02/20/21    Authorization Type medicare A/B    Progress Note Due on Visit 10    PT Start Time 1100    PT Stop Time 1138    PT Time Calculation (min) 38 min    Activity Tolerance Patient tolerated treatment well             Past Medical History:  Diagnosis Date   Adenomatous polyp    Arthritis    "mostly fingers" (10/07/2016)   At high risk for falls    Celiac disease    "I do not have the disease; 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" (10/07/2016)   Chronic back pain    "worse in my neck; lower back also affected" (10/07/2016)   Chronic cholecystitis 5/36/1443   Complication of anesthesia    "never fully regained my taste after my cervical fusion; I don't take much RX so I'm very sensitive to any sedation" (10/07/2016)   Concussion    X2   DDD (degenerative disc disease), cervical    lumbar   Depression    "hx; take Prozac to keep me stable" (10/07/2016)   Dizziness    caused by compression of cervical disc   Family history of adverse reaction to anesthesia    "daughters get PONV; one daughter breaks out severely; grandson got suspended in a state of anxiousness after kidney surgery; had to be held for hours"   GERD (gastroesophageal reflux disease)    Hyperlipidemia    OSA on CPAP    Pneumonia    "once as an adult" (10/07/2016)   Thyroid nodule    Type II diabetes mellitus ([place])    controlled by diet and exercide    Past Surgical History:  Procedure Laterality Date   ANTERIOR CERVICAL DECOMP/DISCECTOMY FUSION  2015   BACK SURGERY     CARPAL TUNNEL RELEASE  Bilateral    CATARACT EXTRACTION, BILATERAL Bilateral 09/10/2014 - 09/24/2014   by [place] N/A 10/16/2016   Procedure: LAPAROSCOPIC CHOLECYSTECTOMY;  Surgeon: [name], MD;  Location: [facility];  Service: General;  Laterality: N/A;   LAPAROSCOPIC CHOLECYSTECTOMY  10/16/2016   LUMBAR [place] SURGERY  2007   TONSILLECTOMY  05/05/1983   TUBAL LIGATION  02/09/1979    There were no vitals filed for this visit.   Subjective Assessment - 01/23/21 1103     Subjective About the same.  Trouble when I sleep.  Starting down right arm too.    Pertinent History DM, stage III kidney; osteoporosis (mild)    How long can you walk comfortably? 15-20 minutes (initially only 1/2 mile)  50 laps .8 mile    Diagnostic tests MRI shows disc bulging    Patient Stated Goals do 2-3 hours of housework    Currently in Pain? Yes    Pain Score 1     Pain Location Neck    Pain Type Chronic pain  [place] Adult PT Treatment/Exercise - 01/23/21 0001       Lumbar Exercises: Seated   Other Seated Lumbar Exercises neural floss 8x left    Other Seated Lumbar Exercises review of band rows and extensions and effect on pain and neural modulation      Moist Heat Therapy   Number Minutes Moist Heat 15 Minutes    Moist Heat Location Cervical   after treatment session     Traction   Type of Traction Cervical    Min (lbs) 10    Max (lbs) 18    Hold Time 20    Rest Time 20    Time 8      Manual Therapy   Soft tissue mobilization bil cervical paraspinals, upper traps, suboccipitals              Trigger Point Dry Needling - 01/23/21 0001     Consent Given? Yes    Education Handout Provided Previously provided    Other Dry Needling bil    Upper Trapezius Response Twitch reponse elicited;Palpable increased muscle length    Suboccipitals Response Palpable increased muscle length    Cervical multifidi Response Palpable increased muscle length                      PT Short Term Goals - 12/26/20 1941       PT SHORT TERM GOAL #1   Title Pt will be independent with her initial HEP to decrease pain/modulate symptoms    Time 6    Period Weeks    Status New    Target Date 02/06/21      PT SHORT TERM GOAL #2   Title Pt will report at least 30% decrease in neck pain and UE pain/numbness.    Time 6    Period Weeks    Status New      PT SHORT TERM GOAL #3   Title Nexk Disability Index improved to 38%    Time 6    Period Weeks    Status New      PT SHORT TERM GOAL #4   Title Cervical flexion improved to 40 degrees and extension to 50 degrees needed for grooming/dressing tasks    Time 6    Period Weeks    Status New               PT Long Term Goals - 12/26/20 1944       PT LONG TERM GOAL #1   Title The patient will be independent in advance HEP for self management and further improvement of symptoms    Time 12    Period Weeks    Status New    Target Date 03/20/21      PT LONG TERM GOAL #2   Title Pt will report atleast 50% improvement in neck and UE paresthesia with daily activity.    Time 12    Period Weeks    Status New      PT LONG TERM GOAL #3   Title Improved cervical sidebending ROM improved to 40 degrees and rotation to 45 degrees needed for driving    Time 12    Period Weeks    Status New      PT LONG TERM GOAL #4   Title Neck Disability Index improved to 30% indicating improved function with less pain    Time 12    Period Weeks    Status New  PT LONG TERM GOAL #5   Title Cervical and periscapular strength improved to 4+/5 needed for lifting/ carrying bags    Time 12    Period Weeks                   Plan - 01/23/21 1317     Clinical Impression Statement The patient reports min benefit for neck and UE symptom relief but wants to have a little more time before follow up with the doctor.  No headaches this week and current pain level is low with most pain at  night/sleeping.  Tender points in upper traps and multifidi but respond well to Dn and manual therapy.  Improving tolerance to mechanical traction.  Therapist closely monitoring symptoms during treatment session.    Rehab Potential Good    PT Frequency 1x / week    PT Duration 12 weeks    PT Treatment/Interventions ADLs/Self Care Home Management;Biofeedback;Cryotherapy;Electrical Stimulation;Moist Heat;Traction;Therapeutic activities;Therapeutic exercise;Neuromuscular re-education;Patient/family education;Manual techniques;Passive range of motion;Dry needling;Taping;Aquatic Therapy;Ultrasound    PT Next Visit Plan shorten traction time  8 min lighter pull/less hold time 20 sec;   DN to bil cervical region;  manual therapy;  neck ROM ex's; review neural floss and band ex's    PT Home Exercise Plan Access Code: [facility]             Patient will benefit from skilled therapeutic intervention in order to improve the following deficits and impairments:  Pain, Increased fascial restricitons, Decreased strength, Decreased activity tolerance, Decreased range of motion, Increased muscle spasms, Decreased endurance, Impaired UE functional use  Visit Diagnosis: Cervicalgia  Radiculopathy, cervical region     Problem List Patient Active Problem List   Diagnosis Date Noted   Closed fracture of 9th & 10th ribs of right side 12/17/2016   Pneumothorax on right 12/17/2016   Lumbar degenerative disc disease 12/17/2016   Essential hypertension 12/17/2016   Dyslipidemia 10/07/2016   Pulmonary nodule 10/07/2016   Abnormal CXR    Chest pain 10/06/2016   S/P lumbar spinal fusion 08/05/2016   Obstructive sleep apnea syndrome, mild 10/27/2013   Controlled type 2 diabetes mellitus without complication, without long-term current use of insulin ([place]) 06/19/2011   GAD (generalized anxiety disorder) 11/01/2009   Ruben Im, PT 01/23/21 1:23 PM Phone: [phone] Fax: [phone]  Alvera Singh,  PT 01/23/2021, 1:23 PM  [facility] [address], [place], [place], [address] Phone: [phone]   Fax:  [phone]  Name: DJUANA LITTLETON MRN: [id number] Date of Birth: [birth year]

## 2021-01-26 DIAGNOSIS — M542 Cervicalgia: Secondary | ICD-10-CM

## 2021-01-30 ENCOUNTER — Other Ambulatory Visit: Payer: Self-pay

## 2021-01-30 ENCOUNTER — Ambulatory Visit: Payer: Medicare Other | Admitting: Physical Therapy

## 2021-01-30 DIAGNOSIS — M542 Cervicalgia: Secondary | ICD-10-CM

## 2021-01-30 DIAGNOSIS — M5412 Radiculopathy, cervical region: Secondary | ICD-10-CM

## 2021-01-30 NOTE — Therapy (Signed)
[facility] [address], [place], [place], [address] Phone: [phone]   Fax:  [phone]  Physical Therapy Treatment  Patient Details  Name: Krista Austin MRN: [id number] Date of Birth: 1945-07-21 Referring Provider (PT): [name] PA   Encounter Date: 01/30/2021   PT End of Session - 01/30/21 1644     Visit Number 6    Date for PT Re-Evaluation 02/20/21    Authorization Type medicare A/B    Progress Note Due on Visit 10    PT Start Time 1100    PT Stop Time 1135    PT Time Calculation (min) 35 min    Activity Tolerance Patient tolerated treatment well             Past Medical History:  Diagnosis Date   Adenomatous polyp    Arthritis    "mostly fingers" (10/07/2016)   At high risk for falls    Celiac disease    "I do not have the disease; 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" (10/07/2016)   Chronic back pain    "worse in my neck; lower back also affected" (10/07/2016)   Chronic cholecystitis 11/05/8887   Complication of anesthesia    "never fully regained my taste after my cervical fusion; I don't take much RX so I'm very sensitive to any sedation" (10/07/2016)   Concussion    X2   DDD (degenerative disc disease), cervical    lumbar   Depression    "hx; take Prozac to keep me stable" (10/07/2016)   Dizziness    caused by compression of cervical disc   Family history of adverse reaction to anesthesia    "daughters get PONV; one daughter breaks out severely; grandson got suspended in a state of anxiousness after kidney surgery; had to be held for hours"   GERD (gastroesophageal reflux disease)    Hyperlipidemia    OSA on CPAP    Pneumonia    "once as an adult" (10/07/2016)   Thyroid nodule    Type II diabetes mellitus ([place])    controlled by diet and exercide    Past Surgical History:  Procedure Laterality Date   ANTERIOR CERVICAL DECOMP/DISCECTOMY FUSION  2015   BACK SURGERY     CARPAL TUNNEL RELEASE  Bilateral    CATARACT EXTRACTION, BILATERAL Bilateral 09/10/2014 - 09/24/2014   by [place] N/A 10/16/2016   Procedure: LAPAROSCOPIC CHOLECYSTECTOMY;  Surgeon: [name], MD;  Location: [facility];  Service: General;  Laterality: N/A;   LAPAROSCOPIC CHOLECYSTECTOMY  10/16/2016   LUMBAR [place] SURGERY  2007   TONSILLECTOMY  05/05/1983   TUBAL LIGATION  02/09/1979    There were no vitals filed for this visit.   Subjective Assessment - 01/30/21 1107     Subjective I feel this helps but I didn't sleep well.  Hurting in lower thoracic region.  Saturday and Sunday I tried a back cushion so that evened me up and slept better.  Then Tuesday night it was right were I was.  I'm having an MRI and following up with Dr. Saintclair Halsted.  Still with UE symptoms, all fingers.    Pertinent History DM, stage III kidney; osteoporosis (mild)    How long can you walk comfortably? 15-20 minutes (initially only 1/2 mile)  50 laps .8 mile    Diagnostic tests MRI shows disc bulging    Patient Stated Goals do 2-3 hours of housework    Currently in Pain? Yes  Pain Score 2     Pain Location Neck    Pain Orientation Left;Right    Pain Type Chronic pain    Pain Radiating Towards Left UE and all fingers                               [facility] Adult PT Treatment/Exercise - 01/30/21 0001       Therapeutic Activites    Therapeutic Activities Other Therapeutic Activities    Other Therapeutic Activities given yellow band for rows, extensions for home      Electrical Stimulation   Electrical Stimulation Location with DN bil cervical and upper trap    Electrical Stimulation Action 1.5 v    Electrical Stimulation Parameters 8 min    Electrical Stimulation Goals Pain      Manual Therapy   Soft tissue mobilization bil cervical paraspinals, upper traps, suboccipitals              Trigger Point Dry Needling - 01/30/21 0001     Consent Given? Yes    Education Handout Provided  Previously provided    Printmaker Performed with Dry Needling Yes    E-stim with Dry Needling Details bil cervical/Upper trap    Other Dry Needling bil    Upper Trapezius Response Twitch reponse elicited;Palpable increased muscle length    Suboccipitals Response Palpable increased muscle length    Cervical multifidi Response Palpable increased muscle length                     PT Short Term Goals - 12/26/20 1941       PT SHORT TERM GOAL #1   Title Pt will be independent with her initial HEP to decrease pain/modulate symptoms    Time 6    Period Weeks    Status New    Target Date 02/06/21      PT SHORT TERM GOAL #2   Title Pt will report at least 30% decrease in neck pain and UE pain/numbness.    Time 6    Period Weeks    Status New      PT SHORT TERM GOAL #3   Title Nexk Disability Index improved to 38%    Time 6    Period Weeks    Status New      PT SHORT TERM GOAL #4   Title Cervical flexion improved to 40 degrees and extension to 50 degrees needed for grooming/dressing tasks    Time 6    Period Weeks    Status New               PT Long Term Goals - 12/26/20 1944       PT LONG TERM GOAL #1   Title The patient will be independent in advance HEP for self management and further improvement of symptoms    Time 12    Period Weeks    Status New    Target Date 03/20/21      PT LONG TERM GOAL #2   Title Pt will report atleast 50% improvement in neck and UE paresthesia with daily activity.    Time 12    Period Weeks    Status New      PT LONG TERM GOAL #3   Title Improved cervical sidebending ROM improved to 40 degrees and rotation to 45 degrees needed for driving    Time 12    Period  Weeks    Status New      PT LONG TERM GOAL #4   Title Neck Disability Index improved to 30% indicating improved function with less pain    Time 12    Period Weeks    Status New      PT LONG TERM GOAL #5   Title Cervical and periscapular strength  improved to 4+/5 needed for lifting/ carrying bags    Time 12    Period Weeks                   Plan - 01/30/21 1644     Clinical Impression Statement Deferred mechanical traction today to test for symptom response.  Trial of ES with DN in dermatomal pattern for pain control of neural in addition to muscular symptoms.  Less painful in cervical multifidi muscles today.  She reports she plans to try yellow band ex's for pain modulation as well at home.  Therapist monitoring response throughout treatment session.    Personal Factors and Comorbidities Age;Comorbidity 2;Time since onset of injury/illness/exacerbation;Comorbidity 1    Comorbidities DM; CKD; carpal tunnel    Examination-Activity Limitations Caring for Others;Carry;Lift;Bend;Squat    Rehab Potential Good    PT Frequency 1x / week    PT Duration 12 weeks    PT Treatment/Interventions ADLs/Self Care Home Management;Biofeedback;Cryotherapy;Electrical Stimulation;Moist Heat;Traction;Therapeutic activities;Therapeutic exercise;Neuromuscular re-education;Patient/family education;Manual techniques;Passive range of motion;Dry needling;Taping;Aquatic Therapy;Ultrasound    PT Next Visit Plan hold traction;  DN with ES to bil cervical and upper trap muscles; pt awaiting MRI; check progress toward goals; NDI    PT Home Exercise Plan Access Code: [facility]             Patient will benefit from skilled therapeutic intervention in order to improve the following deficits and impairments:  Pain, Increased fascial restricitons, Decreased strength, Decreased activity tolerance, Decreased range of motion, Increased muscle spasms, Decreased endurance, Impaired UE functional use  Visit Diagnosis: Cervicalgia  Radiculopathy, cervical region     Problem List Patient Active Problem List   Diagnosis Date Noted   Closed fracture of 9th & 10th ribs of right side 12/17/2016   Pneumothorax on right 12/17/2016   Lumbar degenerative disc  disease 12/17/2016   Essential hypertension 12/17/2016   Dyslipidemia 10/07/2016   Pulmonary nodule 10/07/2016   Abnormal CXR    Chest pain 10/06/2016   S/P lumbar spinal fusion 08/05/2016   Obstructive sleep apnea syndrome, mild 10/27/2013   Controlled type 2 diabetes mellitus without complication, without long-term current use of insulin ([place]) 06/19/2011   GAD (generalized anxiety disorder) 11/01/2009   Ruben Im, PT 01/30/21 4:50 PM Phone: [phone] Fax: [phone]  Alvera Singh, PT 01/30/2021, 4:49 PM  [facility] [address] [place], [place], [address] Phone: [phone]   Fax:  [phone]  Name: Krista Austin MRN: [id number] Date of Birth: 1946/02/18

## 2021-01-31 NOTE — Telephone Encounter (Signed)
Can we check on this

## 2021-02-03 ENCOUNTER — Ambulatory Visit
Admission: RE | Admit: 2021-02-03 | Discharge: 2021-02-03 | Disposition: A | Discharge status: Home / Self Care | Payer: Medicare Other | Source: Ambulatory Visit | Attending: Family | Admitting: Family

## 2021-02-03 DIAGNOSIS — M542 Cervicalgia: Secondary | ICD-10-CM

## 2021-02-06 ENCOUNTER — Other Ambulatory Visit: Payer: Self-pay

## 2021-02-06 ENCOUNTER — Ambulatory Visit: Payer: Medicare Other | Attending: Neurosurgery | Admitting: Physical Therapy

## 2021-02-06 DIAGNOSIS — M542 Cervicalgia: Secondary | ICD-10-CM | POA: Diagnosis present

## 2021-02-06 DIAGNOSIS — M5412 Radiculopathy, cervical region: Secondary | ICD-10-CM | POA: Insufficient documentation

## 2021-02-06 NOTE — Therapy (Signed)
[facility] @ [place], [place], [address] Phone:     Fax:     Physical Therapy Treatment  Patient Details  Name: Krista Austin MRN: [id number] Date of Birth: Sep 07, 1945 Referring Provider (PT): [name] PA   Encounter Date: 02/06/2021   PT End of Session - 02/06/21 1217     Visit Number 7    Date for PT Re-Evaluation 02/20/21    Authorization Type medicare A/B    Progress Note Due on Visit 10    PT Start Time 5465    PT Stop Time 0354   DN, heat   PT Time Calculation (min) 44 min    Activity Tolerance Patient tolerated treatment well             Past Medical History:  Diagnosis Date   A-fib ([place])    Adenomatous polyp    Arthritis    "mostly fingers" (10/07/2016)   At high risk for falls    Celiac disease    "I do not have the disease; 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" (10/07/2016)   Chronic back pain    "worse in my neck; lower back also affected" (10/07/2016)   Chronic cholecystitis 65/68/1275   Complication of anesthesia    "never fully regained my taste after my cervical fusion; I don't take much RX so I'm very sensitive to any sedation" (10/07/2016)   Concussion    X2   DDD (degenerative disc disease), cervical    lumbar   Depression    "hx; take Prozac to keep me stable" (10/07/2016)   Dizziness    caused by compression of cervical disc   Family history of adverse reaction to anesthesia    "daughters get PONV; one daughter breaks out severely; grandson got suspended in a state of anxiousness after kidney surgery; had to be held for hours"   GERD (gastroesophageal reflux disease)    Hyperlipidemia    OSA on CPAP    Pneumonia    "once as an adult" (10/07/2016)   Thyroid nodule    Type II diabetes mellitus ([place])    controlled by diet and exercide    Past Surgical History:  Procedure Laterality Date   ANTERIOR CERVICAL DECOMP/DISCECTOMY FUSION  2015   BACK SURGERY     CARPAL TUNNEL  RELEASE Bilateral    CATARACT EXTRACTION, BILATERAL Bilateral 09/10/2014 - 09/24/2014   by [place] N/A 10/16/2016   Procedure: LAPAROSCOPIC CHOLECYSTECTOMY;  Surgeon: [name], MD;  Location: [facility];  Service: General;  Laterality: N/A;   LAPAROSCOPIC CHOLECYSTECTOMY  10/16/2016   LUMBAR [place] SURGERY  2007   TONSILLECTOMY  05/05/1983   TUBAL LIGATION  02/09/1979    There were no vitals filed for this visit.   Subjective Assessment - 02/06/21 1057     Subjective Had MRI showed no change from previous imaging.  Multi level changes in thoracic spine.  I've been better this week.  Did better this week. Slept better with carpal tunnel braces.  Primary care follow up on the 20th.  13 doctor's appts over the 18 weeks.  It's a busy time right now.    Pertinent History DM, stage III kidney; osteoporosis (mild)    Diagnostic tests MRI 02/04/21 C5-6 solid arthrodesis and no impingement; mild to moderat narrowing left C3-4 and right C 4-5, thoracic multilevel degenerative changes    Currently in Pain? Yes    Pain Score 2  Pain Location Neck    Pain Orientation Right;Left                               [facility] Adult PT Treatment/Exercise - 02/06/21 0001       Moist Heat Therapy   Number Minutes Moist Heat 15 Minutes   while her son is undergoing treatment   Moist Heat Location Cervical   after treatment session     Electrical Stimulation   Electrical Stimulation Location with DN bil cervical and upper trap    Electrical Stimulation Action 1.5 ma to bil cervical musculature    Electrical Stimulation Parameters 8 min    Electrical Stimulation Goals Pain      Manual Therapy   Soft tissue mobilization bil cervical paraspinals, upper traps, suboccipitals              Trigger Point Dry Needling - 02/06/21 0001     Consent Given? Yes    Education Handout Provided Previously provided    Printmaker Performed with Dry Needling Yes     E-stim with Dry Needling Details bil cervical/Upper trap    Other Dry Needling bil    Upper Trapezius Response Twitch reponse elicited;Palpable increased muscle length    Suboccipitals Response Palpable increased muscle length    Cervical multifidi Response Palpable increased muscle length                     PT Short Term Goals - 12/26/20 1941       PT SHORT TERM GOAL #1   Title Pt will be independent with her initial HEP to decrease pain/modulate symptoms    Time 6    Period Weeks    Status New    Target Date 02/06/21      PT SHORT TERM GOAL #2   Title Pt will report at least 30% decrease in neck pain and UE pain/numbness.    Time 6    Period Weeks    Status New      PT SHORT TERM GOAL #3   Title Nexk Disability Index improved to 38%    Time 6    Period Weeks    Status New      PT SHORT TERM GOAL #4   Title Cervical flexion improved to 40 degrees and extension to 50 degrees needed for grooming/dressing tasks    Time 6    Period Weeks    Status New               PT Long Term Goals - 12/26/20 1944       PT LONG TERM GOAL #1   Title The patient will be independent in advance HEP for self management and further improvement of symptoms    Time 12    Period Weeks    Status New    Target Date 03/20/21      PT LONG TERM GOAL #2   Title Pt will report atleast 50% improvement in neck and UE paresthesia with daily activity.    Time 12    Period Weeks    Status New      PT LONG TERM GOAL #3   Title Improved cervical sidebending ROM improved to 40 degrees and rotation to 45 degrees needed for driving    Time 12    Period Weeks    Status New      PT LONG TERM GOAL #4  Title Neck Disability Index improved to 30% indicating improved function with less pain    Time 12    Period Weeks    Status New      PT LONG TERM GOAL #5   Title Cervical and periscapular strength improved to 4+/5 needed for lifting/ carrying bags    Time 12    Period Weeks                    Plan - 02/06/21 1217     Clinical Impression Statement No acute changes in cervical imaging.  The patient reports a positive response to DN with ES last visit and without mechanical traction.  Improved overall tender spots in cervical paraspinals and multifidi but several still present in upper traps and suboccipitals.  Therapist monitoring response throughout treatment session.    Personal Factors and Comorbidities Age;Comorbidity 2;Time since onset of injury/illness/exacerbation;Comorbidity 1    Comorbidities DM; CKD; carpal tunnel    Examination-Activity Limitations Caring for Others;Carry;Lift;Bend;Squat    Rehab Potential Good    PT Frequency 1x / week    PT Duration 12 weeks    PT Treatment/Interventions ADLs/Self Care Home Management;Biofeedback;Cryotherapy;Electrical Stimulation;Moist Heat;Traction;Therapeutic activities;Therapeutic exercise;Neuromuscular re-education;Patient/family education;Manual techniques;Passive range of motion;Dry needling;Taping;Aquatic Therapy;Ultrasound    PT Next Visit Plan ERO next visit; possible addition for eval and treat of lumbar when she see's her primary care; hold traction;  DN with ES to bil cervical and upper trap muscles;  check progress toward goals; NDI             Patient will benefit from skilled therapeutic intervention in order to improve the following deficits and impairments:  Pain, Increased fascial restricitons, Decreased strength, Decreased activity tolerance, Decreased range of motion, Increased muscle spasms, Decreased endurance, Impaired UE functional use  Visit Diagnosis: Cervicalgia  Radiculopathy, cervical region     Problem List Patient Active Problem List   Diagnosis Date Noted   Closed fracture of 9th & 10th ribs of right side 12/17/2016   Pneumothorax on right 12/17/2016   Lumbar degenerative disc disease 12/17/2016   Essential hypertension 12/17/2016   Dyslipidemia 10/07/2016    Pulmonary nodule 10/07/2016   Abnormal CXR    Chest pain 10/06/2016   S/P lumbar spinal fusion 08/05/2016   Obstructive sleep apnea syndrome, mild 10/27/2013   Controlled type 2 diabetes mellitus without complication, without long-term current use of insulin ([place]) 06/19/2011   GAD (generalized anxiety disorder) 11/01/2009   Ruben Im, PT 02/06/21 12:24 PM Phone: [phone] Fax: [phone]  Alvera Singh, PT 02/06/2021, 12:23 PM  [facility] @ [place], [place], [address] Phone:     Fax:     Name: Krista Austin MRN: [id number] Date of Birth: 1946/01/01

## 2021-02-11 ENCOUNTER — Telehealth: Payer: Self-pay

## 2021-02-11 NOTE — Telephone Encounter (Signed)
NOTES SCANNED TO REFERRAL 

## 2021-02-12 ENCOUNTER — Ambulatory Visit: Payer: Medicare Other | Admitting: Cardiology

## 2021-02-15 ENCOUNTER — Other Ambulatory Visit: Payer: Self-pay | Admitting: Family

## 2021-02-20 ENCOUNTER — Encounter: Payer: Medicare Other | Admitting: Physical Therapy

## 2021-02-27 ENCOUNTER — Ambulatory Visit: Payer: Medicare Other | Attending: Physician Assistant | Admitting: Physical Therapy

## 2021-02-27 ENCOUNTER — Other Ambulatory Visit: Payer: Self-pay

## 2021-02-27 DIAGNOSIS — M545 Low back pain, unspecified: Secondary | ICD-10-CM | POA: Insufficient documentation

## 2021-02-27 DIAGNOSIS — G8929 Other chronic pain: Secondary | ICD-10-CM | POA: Insufficient documentation

## 2021-02-27 DIAGNOSIS — M5136 Other intervertebral disc degeneration, lumbar region: Secondary | ICD-10-CM | POA: Insufficient documentation

## 2021-02-27 DIAGNOSIS — M5412 Radiculopathy, cervical region: Secondary | ICD-10-CM | POA: Insufficient documentation

## 2021-02-27 DIAGNOSIS — M6281 Muscle weakness (generalized): Secondary | ICD-10-CM | POA: Insufficient documentation

## 2021-02-27 DIAGNOSIS — M542 Cervicalgia: Secondary | ICD-10-CM | POA: Diagnosis present

## 2021-02-27 NOTE — Therapy (Signed)
[facility] @ [place], [place], [address] Phone: [phone]   Fax:  [phone]  Physical Therapy Treatment/Recertification  Patient Details  Name: Krista Austin MRN: [id number] Date of Birth: 1945-06-07 Referring Provider (PT): [name] PA   Encounter Date: 02/27/2021   PT End of Session - 02/27/21 1137     Visit Number 8    Date for PT Re-Evaluation 04/24/21    Authorization Type medicare A/B    Progress Note Due on Visit 10    PT Start Time 1055    PT Stop Time 1143    PT Time Calculation (min) 48 min    Activity Tolerance Patient tolerated treatment well             Past Medical History:  Diagnosis Date   A-fib ([place])    Adenomatous polyp    Arthritis    "mostly fingers" (10/07/2016)   At high risk for falls    Celiac disease    "I do not have the disease; 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" (10/07/2016)   Chronic back pain    "worse in my neck; lower back also affected" (10/07/2016)   Chronic cholecystitis 93/23/5573   Complication of anesthesia    "never fully regained my taste after my cervical fusion; I don't take much RX so I'm very sensitive to any sedation" (10/07/2016)   Concussion    X2   DDD (degenerative disc disease), cervical    lumbar   Depression    "hx; take Prozac to keep me stable" (10/07/2016)   Dizziness    caused by compression of cervical disc   Family history of adverse reaction to anesthesia    "daughters get PONV; one daughter breaks out severely; grandson got suspended in a state of anxiousness after kidney surgery; had to be held for hours"   GERD (gastroesophageal reflux disease)    Hyperlipidemia    OSA on CPAP    Pneumonia    "once as an adult" (10/07/2016)   Thyroid nodule    Type II diabetes mellitus ([place])    controlled by diet and exercide    Past Surgical History:  Procedure Laterality Date   ANTERIOR CERVICAL DECOMP/DISCECTOMY FUSION  2015   BACK SURGERY      CARPAL TUNNEL RELEASE Bilateral    CATARACT EXTRACTION, BILATERAL Bilateral 09/10/2014 - 09/24/2014   by [place] N/A 10/16/2016   Procedure: LAPAROSCOPIC CHOLECYSTECTOMY;  Surgeon: [name], MD;  Location: [facility];  Service: General;  Laterality: N/A;   LAPAROSCOPIC CHOLECYSTECTOMY  10/16/2016   LUMBAR [place] SURGERY  2007   TONSILLECTOMY  05/05/1983   TUBAL LIGATION  02/09/1979    There were no vitals filed for this visit.   Subjective Assessment - 02/27/21 1056     Subjective Neck and arm is doing amazingly well.  Wearing wrist brace has helped.  Only 1 night where I couldn't get comfortable.    Pertinent History DM, stage III kidney; osteoporosis (mild)    Diagnostic tests MRI 02/04/21 C5-6 solid arthrodesis and no impingement; mild to moderat narrowing left C3-4 and right C 4-5, thoracic multilevel degenerative changes    Patient Stated Goals do 2-3 hours of housework    Currently in Pain? Yes    Pain Score 1     Pain Location Neck    Pain Orientation Right;Left    Pain Score 2    Pain Location Back    Pain Orientation  Right    Pain Type Chronic pain    Pain Radiating Towards right LE                [facility] PT Assessment - 02/27/21 0001       Observation/Other Assessments   Neck Disability Index  40%      AROM   Overall AROM Comments mild right hip external rotation limitation    Cervical Flexion 34    Cervical Extension 40    Cervical - Right Side Bend 22    Cervical - Left Side Bend 15    Cervical - Right Rotation 35    Cervical - Left Rotation 30    Lumbar Flexion 50    Lumbar Extension 10    Lumbar - Right Side Bend 25    Lumbar - Left Side Bend 25      Strength   Overall Strength Comments cervical and periscapular strength 4/5; lumbo/pelvic/hip strength grossly 4/5      Palpation   Palpation comment tender points in right gluteals, piriformis      FABER test   findings Negative      Slump test   Findings Positive     Comment only slightly reactive (inc pull compared to left)                           [facility] Adult PT Treatment/Exercise - 02/27/21 0001       Moist Heat Therapy   Moist Heat Location Cervical   after treatment session     Electrical Stimulation   Electrical Stimulation Location with DN bil cervical and upper trap    Electrical Stimulation Action 2.0 right lumbar  multifidi    Electrical Stimulation Parameters 8 min    Electrical Stimulation Goals Pain      Manual Therapy   Soft tissue mobilization bil cervical paraspinals, upper traps, suboccipitals, bil lumbar paraspinals, right gluteals, piriformis              Trigger Point Dry Needling - 02/27/21 0001     Consent Given? Yes    Education Handout Provided Previously provided    Printmaker Performed with Dry Needling Yes    E-stim with Dry Needling Details right lumbar multifidi    Other Dry Needling bil    Upper Trapezius Response Twitch reponse elicited;Palpable increased muscle length    Suboccipitals Response Palpable increased muscle length    Cervical multifidi Response Palpable increased muscle length    Gluteus Minimus Response Palpable increased muscle length    Gluteus Medius Response Palpable increased muscle length    Gluteus Maximus Response Palpable increased muscle length    Piriformis Response Palpable increased muscle length   right   Lumbar multifidi Response Palpable increased muscle length   right with ES                    PT Short Term Goals - 02/27/21 1637       PT SHORT TERM GOAL #1   Title Pt will be independent with her initial HEP to decrease pain/modulate symptoms    Status Achieved      PT SHORT TERM GOAL #2   Title Pt will report at least 30% decrease in neck pain and UE pain/numbness.    Status Achieved      PT SHORT TERM GOAL #3   Title Nexk Disability Index improved to 38%    Status Partially Met  PT SHORT TERM GOAL #4   Title  Cervical flexion improved to 40 degrees and extension to 50 degrees needed for grooming/dressing tasks    Status Partially Met               PT Long Term Goals - 02/27/21 1635       PT LONG TERM GOAL #1   Title The patient will be independent in advance HEP for self management and further improvement of symptoms    Time 8    Period Weeks    Status On-going    Target Date 04/24/21      PT LONG TERM GOAL #2   Title Pt will report atleast 50% improvement in neck and UE paresthesia with daily activity.    Time 8    Period Weeks    Status On-going      PT LONG TERM GOAL #3   Title Improved cervical sidebending ROM improved to 40 degrees and rotation to 45 degrees needed for driving    Time 8    Period Weeks    Status On-going      PT LONG TERM GOAL #4   Title Neck Disability Index improved to 30% indicating improved function with less pain    Baseline Improved to 40%    Time 8    Period Weeks    Status On-going      PT LONG TERM GOAL #5   Title Cervical and periscapular strength improved to 4+/5 needed for lifting/ carrying bags    Time 8    Period Weeks    Status On-going      Additional Long Term Goals   Additional Long Term Goals Yes      PT LONG TERM GOAL #6   Title LBP and right peripheral symptoms improved by 50%    Time 8    Period Weeks    Status New      PT LONG TERM GOAL #7   Title Lumbar flexion and extension ROM improved 10 degrees needed for improved mobility as caregiver for her son    Time 8    Period Weeks    Status New                   Plan - 02/27/21 1638     Clinical Impression Statement The patient reports decreased neck and UE neural symptoms recently.  Neck Disability Index has improved from 46% to 40%.  Cervical ROM improving slowly although sidebending limited secondary to myofascial restriction.  Improved ROM noted after DN and manual therapy.  She reports lumbar and right LE sciatic like pain seem to be flaring up and a  new PT referral has been received to include eval and treat to this area.  Limitations noted particularly with lumbar flexion and extension ROM.  Lumbo/pelvic/hip core strength grossly 4/5.  Minimal symptom reactivity with slump test on right.  She is progressing with rehab goals although expect slower gains secondary to chroncity, multi region impairments and co-morbidities.   Will add lumbar treatment to plan of care and continue treatment for cervical region for best long term outcomes.    Personal Factors and Comorbidities Age;Comorbidity 2;Time since onset of injury/illness/exacerbation;Comorbidity 1    Comorbidities DM; CKD; carpal tunnel    Examination-Activity Limitations Caring for Others;Carry;Lift;Bend;Squat    Examination-Participation Restrictions Cleaning;Community Activity;Meal Prep    Stability/Clinical Decision Making Stable/Uncomplicated    Rehab Potential Good    PT Frequency 1x / week  PT Duration 8 weeks    PT Treatment/Interventions ADLs/Self Care Home Management;Biofeedback;Cryotherapy;Electrical Stimulation;Moist Heat;Traction;Therapeutic activities;Therapeutic exercise;Neuromuscular re-education;Patient/family education;Manual techniques;Passive range of motion;Dry needling;Taping;Aquatic Therapy;Ultrasound    PT Next Visit Plan add seated thoracic extension;  DN/ES to right lumbar; DN glutes, piriformis, cervical musculature;  1x/week    PT Home Exercise Plan Access Code: [facility]             Patient will benefit from skilled therapeutic intervention in order to improve the following deficits and impairments:  Pain, Increased fascial restricitons, Decreased strength, Decreased activity tolerance, Decreased range of motion, Increased muscle spasms, Decreased endurance, Impaired UE functional use  Visit Diagnosis: Cervicalgia - Plan: PT plan of care cert/re-cert  Lumbar degenerative disc disease - Plan: PT plan of care cert/re-cert  Radiculopathy, cervical region -  Plan: PT plan of care cert/re-cert  Chronic bilateral low back pain, unspecified whether sciatica present - Plan: PT plan of care cert/re-cert  Muscle weakness (generalized) - Plan: PT plan of care cert/re-cert     Problem List Patient Active Problem List   Diagnosis Date Noted   Closed fracture of 9th & 10th ribs of right side 12/17/2016   Pneumothorax on right 12/17/2016   Lumbar degenerative disc disease 12/17/2016   Essential hypertension 12/17/2016   Dyslipidemia 10/07/2016   Pulmonary nodule 10/07/2016   Abnormal CXR    Chest pain 10/06/2016   S/P lumbar spinal fusion 08/05/2016   Obstructive sleep apnea syndrome, mild 10/27/2013   Controlled type 2 diabetes mellitus without complication, without long-term current use of insulin ([place]) 06/19/2011   GAD (generalized anxiety disorder) 11/01/2009   Ruben Im, PT 02/27/21 4:53 PM Phone: [phone] Fax: [phone]  Alvera Singh, PT 02/27/2021, 4:52 PM  [facility] @ [place], [place], [address] Phone: [phone]   Fax:  [phone]  Name: Krista Austin MRN: [id number] Date of Birth: 1945/10/23

## 2021-03-06 ENCOUNTER — Encounter: Payer: Medicare Other | Admitting: Physical Therapy

## 2021-03-13 ENCOUNTER — Other Ambulatory Visit: Payer: Self-pay

## 2021-03-13 ENCOUNTER — Ambulatory Visit: Payer: Medicare Other | Attending: Neurosurgery | Admitting: Physical Therapy

## 2021-03-13 DIAGNOSIS — M5136 Other intervertebral disc degeneration, lumbar region: Secondary | ICD-10-CM | POA: Insufficient documentation

## 2021-03-13 DIAGNOSIS — M5412 Radiculopathy, cervical region: Secondary | ICD-10-CM

## 2021-03-13 DIAGNOSIS — M51369 Other intervertebral disc degeneration, lumbar region without mention of lumbar back pain or lower extremity pain: Secondary | ICD-10-CM

## 2021-03-13 DIAGNOSIS — M542 Cervicalgia: Secondary | ICD-10-CM

## 2021-03-13 NOTE — Therapy (Signed)
[facility] @ [place], [place], [address] Phone: [phone]   Fax:  [phone]  Physical Therapy Treatment  Patient Details  Name: Krista Austin MRN: [id number] Date of Birth: 12/25/1945 Referring Provider (PT): [name] PA   Encounter Date: 03/13/2021   PT End of Session - 03/13/21 1635     Visit Number 9    Date for PT Re-Evaluation 04/24/21    Authorization Type medicare A/B    Progress Note Due on Visit 10    PT Start Time 3532    PT Stop Time 1135    PT Time Calculation (min) 43 min    Activity Tolerance Patient tolerated treatment well             Past Medical History:  Diagnosis Date   A-fib ([place])    Adenomatous polyp    Arthritis    "mostly fingers" (10/07/2016)   At high risk for falls    Celiac disease    "I do not have the disease; 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" (10/07/2016)   Chronic back pain    "worse in my neck; lower back also affected" (10/07/2016)   Chronic cholecystitis 99/24/2683   Complication of anesthesia    "never fully regained my taste after my cervical fusion; I don't take much RX so I'm very sensitive to any sedation" (10/07/2016)   Concussion    X2   DDD (degenerative disc disease), cervical    lumbar   Depression    "hx; take Prozac to keep me stable" (10/07/2016)   Dizziness    caused by compression of cervical disc   Family history of adverse reaction to anesthesia    "daughters get PONV; one daughter breaks out severely; grandson got suspended in a state of anxiousness after kidney surgery; had to be held for hours"   GERD (gastroesophageal reflux disease)    Hyperlipidemia    OSA on CPAP    Pneumonia    "once as an adult" (10/07/2016)   Thyroid nodule    Type II diabetes mellitus ([place])    controlled by diet and exercide    Past Surgical History:  Procedure Laterality Date   ANTERIOR CERVICAL DECOMP/DISCECTOMY FUSION  2015   BACK SURGERY     CARPAL  TUNNEL RELEASE Bilateral    CATARACT EXTRACTION, BILATERAL Bilateral 09/10/2014 - 09/24/2014   by [place] N/A 10/16/2016   Procedure: LAPAROSCOPIC CHOLECYSTECTOMY;  Surgeon: [name], MD;  Location: [facility];  Service: General;  Laterality: N/A;   LAPAROSCOPIC CHOLECYSTECTOMY  10/16/2016   LUMBAR [place] SURGERY  2007   TONSILLECTOMY  05/05/1983   TUBAL LIGATION  02/09/1979    There were no vitals filed for this visit.   Subjective Assessment - 03/13/21 1059     Subjective I'm doing good.  I still have lateral right LE pain but less.  some neck pain at night.    Pertinent History DM, stage III kidney; osteoporosis (mild)    Diagnostic tests MRI 02/04/21 C5-6 solid arthrodesis and no impingement; mild to moderat narrowing left C3-4 and right C 4-5, thoracic multilevel degenerative changes    Currently in Pain? Yes    Pain Score 1     Pain Location Back    Pain Type Chronic pain    Multiple Pain Sites Yes    Pain Score 2    Pain Location Neck    Pain Type Chronic pain  [facility] Adult PT Treatment/Exercise - 03/13/21 0001       Moist Heat Therapy   Moist Heat Location Cervical   after treatment session     Electrical Stimulation   Electrical Stimulation Location bil lumbar multifidi in L4-/S1 and gluteal region    Electrical Stimulation Action 2.0 ma    Electrical Stimulation Parameters 8    Electrical Stimulation Goals Pain      Manual Therapy   Soft tissue mobilization bil cervical paraspinals, upper traps, suboccipitals, bil lumbar paraspinals, right gluteals, piriformis              Trigger Point Dry Needling - 03/13/21 0001     Consent Given? Yes    Education Handout Provided Previously provided    Printmaker Performed with Dry Needling Yes    E-stim with Dry Needling Details right lumbar multifidi    Other Dry Needling bil    Upper Trapezius Response Twitch reponse  elicited;Palpable increased muscle length    Suboccipitals Response Palpable increased muscle length    Cervical multifidi Response Palpable increased muscle length    Gluteus Minimus Response Palpable increased muscle length    Gluteus Medius Response Palpable increased muscle length    Gluteus Maximus Response Palpable increased muscle length    Piriformis Response Palpable increased muscle length   right   Lumbar multifidi Response Palpable increased muscle length   right with ES                    PT Short Term Goals - 02/27/21 1637       PT SHORT TERM GOAL #1   Title Pt will be independent with her initial HEP to decrease pain/modulate symptoms    Status Achieved      PT SHORT TERM GOAL #2   Title Pt will report at least 30% decrease in neck pain and UE pain/numbness.    Status Achieved      PT SHORT TERM GOAL #3   Title Nexk Disability Index improved to 38%    Status Partially Met      PT SHORT TERM GOAL #4   Title Cervical flexion improved to 40 degrees and extension to 50 degrees needed for grooming/dressing tasks    Status Partially Met               PT Long Term Goals - 02/27/21 1635       PT LONG TERM GOAL #1   Title The patient will be independent in advance HEP for self management and further improvement of symptoms    Time 8    Period Weeks    Status On-going    Target Date 04/24/21      PT LONG TERM GOAL #2   Title Pt will report atleast 50% improvement in neck and UE paresthesia with daily activity.    Time 8    Period Weeks    Status On-going      PT LONG TERM GOAL #3   Title Improved cervical sidebending ROM improved to 40 degrees and rotation to 45 degrees needed for driving    Time 8    Period Weeks    Status On-going      PT LONG TERM GOAL #4   Title Neck Disability Index improved to 30% indicating improved function with less pain    Baseline Improved to 40%    Time 8    Period Weeks    Status On-going  PT LONG TERM  GOAL #5   Title Cervical and periscapular strength improved to 4+/5 needed for lifting/ carrying bags    Time 8    Period Weeks    Status On-going      Additional Long Term Goals   Additional Long Term Goals Yes      PT LONG TERM GOAL #6   Title LBP and right peripheral symptoms improved by 50%    Time 8    Period Weeks    Status New      PT LONG TERM GOAL #7   Title Lumbar flexion and extension ROM improved 10 degrees needed for improved mobility as caregiver for her son    Time 8    Period Weeks    Status New                   Plan - 03/13/21 1636     Clinical Impression Statement The patient reports symptom intensity at a fairly low level this week overall.  Sleeping a little better.  She attributes her improvement to DN and manual therapy.  Treatment targeting L4-5 and L5-S1 to affect radicular symptoms.  Therapist monitoring response closely throughout session.    Personal Factors and Comorbidities Age;Comorbidity 2;Time since onset of injury/illness/exacerbation;Comorbidity 1    Comorbidities DM; CKD; carpal tunnel    Examination-Activity Limitations Caring for Others;Carry;Lift;Bend;Squat    Rehab Potential Good    PT Frequency 1x / week    PT Duration 8 weeks    PT Treatment/Interventions ADLs/Self Care Home Management;Biofeedback;Cryotherapy;Electrical Stimulation;Moist Heat;Traction;Therapeutic activities;Therapeutic exercise;Neuromuscular re-education;Patient/family education;Manual techniques;Passive range of motion;Dry needling;Taping;Aquatic Therapy;Ultrasound    PT Next Visit Plan 10th visit progress note; recheck cervical and lumbar ROM;  resend cert to be signed;  add seated thoracic extension;  DN/ES to right lumbar; DN glutes, piriformis, cervical musculature;  1x/week    PT Home Exercise Plan Access Code: [facility]             Patient will benefit from skilled therapeutic intervention in order to improve the following deficits and impairments:   Pain, Increased fascial restricitons, Decreased strength, Decreased activity tolerance, Decreased range of motion, Increased muscle spasms, Decreased endurance, Impaired UE functional use  Visit Diagnosis: Lumbar degenerative disc disease  Cervicalgia  Radiculopathy, cervical region     Problem List Patient Active Problem List   Diagnosis Date Noted   Closed fracture of 9th & 10th ribs of right side 12/17/2016   Pneumothorax on right 12/17/2016   Lumbar degenerative disc disease 12/17/2016   Essential hypertension 12/17/2016   Dyslipidemia 10/07/2016   Pulmonary nodule 10/07/2016   Abnormal CXR    Chest pain 10/06/2016   S/P lumbar spinal fusion 08/05/2016   Obstructive sleep apnea syndrome, mild 10/27/2013   Controlled type 2 diabetes mellitus without complication, without long-term current use of insulin ([place]) 06/19/2011   GAD (generalized anxiety disorder) 11/01/2009   Ruben Im, PT 03/13/21 4:45 PM Phone: [phone] Fax: [phone]  Alvera Singh, PT 03/13/2021, 4:45 PM  [facility] @ [place], [place], [address] Phone: [phone]   Fax:  [phone]  Name: Krista Austin MRN: [id number] Date of Birth: 1945/08/05

## 2021-03-20 ENCOUNTER — Encounter: Payer: Medicare Other | Admitting: Physical Therapy

## 2021-04-03 ENCOUNTER — Other Ambulatory Visit: Payer: Self-pay

## 2021-04-03 ENCOUNTER — Ambulatory Visit: Payer: Medicare Other | Attending: Physician Assistant | Admitting: Physical Therapy

## 2021-04-03 DIAGNOSIS — M542 Cervicalgia: Secondary | ICD-10-CM | POA: Diagnosis present

## 2021-04-03 DIAGNOSIS — M5136 Other intervertebral disc degeneration, lumbar region: Secondary | ICD-10-CM | POA: Diagnosis not present

## 2021-04-03 NOTE — Therapy (Signed)
[facility] @ [place], [place], [address] Phone: [phone]   Fax:  [phone]  Physical Therapy Treatment  Patient Details  Name: Krista Austin MRN: [id number] Date of Birth: [birth year] Referring Provider (PT): [name] PA  Progress Note Reporting Period 12/26/2020 to 04/03/2021  See note below for Objective Data and Assessment of Progress/Goals.     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" (10/07/2016)   Chronic back pain    "worse in my neck; lower back also affected" (10/07/2016)   Chronic cholecystitis 59/16/3846   Complication of anesthesia    "never fully regained my taste after my cervical fusion; I don't take much RX so I'm very sensitive to any sedation" (10/07/2016)   Concussion    X2   DDD (degenerative disc disease), cervical    lumbar   Depression    "hx; take Prozac to keep me stable" (10/07/2016)   Dizziness    caused by compression of cervical disc   Family history of adverse reaction to anesthesia    "daughters get PONV; one daughter breaks out severely; grandson got suspended in a state of anxiousness after kidney surgery; had to be held for hours"   GERD (gastroesophageal reflux disease)    Hyperlipidemia    OSA on CPAP    Pneumonia    "once as an adult" (10/07/2016)   Thyroid nodule    Type II diabetes mellitus ([place])    controlled by diet and exercide     Past Surgical History:  Procedure Laterality Date   ANTERIOR CERVICAL DECOMP/DISCECTOMY FUSION  2015   BACK SURGERY     CARPAL TUNNEL RELEASE Bilateral    CATARACT EXTRACTION, BILATERAL Bilateral 09/10/2014 - 09/24/2014   by [place] N/A 10/16/2016   Procedure: LAPAROSCOPIC CHOLECYSTECTOMY;  Surgeon: [name], MD;  Location: [facility];  Service: General;  Laterality: N/A;   LAPAROSCOPIC CHOLECYSTECTOMY  10/16/2016   LUMBAR [place] SURGERY  2007   TONSILLECTOMY  05/05/1983   TUBAL LIGATION  02/09/1979    There were no vitals filed for this visit.   Subjective Assessment - 04/03/21 1240     Subjective My neck and back have been doing well.  I just have a little bit of discomfort in my hip.    Pertinent History DM, stage III kidney; osteoporosis (mild)    Diagnostic tests MRI 02/04/21 C5-6 solid arthrodesis and no impingement; mild to moderat narrowing left C3-4 and right C 4-5, thoracic multilevel degenerative changes    Patient Stated Goals do 2-3 hours of housework    Currently in Pain? Yes    Pain Score 2     Pain Location Hip    Pain  Orientation Right                [facility] PT Assessment - 04/03/21 0001       AROM   Overall AROM Comments mild right hip external rotation limitation    Cervical Flexion 34    Cervical Extension 40    Cervical - Right Side Bend 22    Cervical - Left Side Bend 15    Cervical - Right Rotation 35    Cervical - Left Rotation 30    Lumbar Flexion 50    Lumbar Extension 10    Lumbar - Right Side Bend 25    Lumbar - Left Side Bend 25      Strength   Overall Strength Comments cervical and periscapular strength 4/5; lumbo/pelvic/hip strength grossly 4+/5                           [facility] Adult PT Treatment/Exercise - 04/03/21 0001       Moist Heat Therapy   Number Minutes Moist Heat 5 Minutes    Moist Heat Location Hip      Manual Therapy   Soft tissue mobilization right gluteals, piriformis               Trigger Point Dry Needling - 04/03/21 0001     Other Dry Needling right    Gluteus Minimus Response Palpable increased muscle length    Gluteus Medius Response Twitch response elicited;Palpable increased muscle length    Gluteus Maximus Response Twitch response elicited;Palpable increased muscle length    Piriformis Response Palpable increased muscle length                     PT Short Term Goals - 04/03/21 1731       PT SHORT TERM GOAL #1   Status Achieved      PT SHORT TERM GOAL #2   Title Pt will report at least 30% decrease in neck pain and UE pain/numbness.    Status Achieved      PT SHORT TERM GOAL #3   Title Nexk Disability Index improved to 38%    Status Partially Met      PT SHORT TERM GOAL #4   Title Cervical flexion improved to 40 degrees and extension to 50 degrees needed for grooming/dressing tasks    Status Partially Met               PT Long Term Goals - 04/03/21 1732       PT LONG TERM GOAL #1   Title The patient will be independent in advance HEP for self management and further improvement of symptoms    Time 8    Period Weeks    Status Partially Met      PT LONG TERM GOAL #2   Title Pt will report atleast 50% improvement in neck and UE paresthesia with daily activity.    Status Achieved      PT LONG TERM GOAL #3   Title Improved cervical sidebending ROM improved to 40 degrees and rotation to 45 degrees needed for driving    Time 8    Period Weeks    Status On-going      PT LONG TERM GOAL #4   Title Neck Disability Index improved to 30% indicating improved function with less pain    Time 8    Period Weeks    Status On-going  PT LONG TERM GOAL #5   Title Cervical and periscapular strength improved to 4+/5 needed for lifting/ carrying bags    Status Achieved      PT LONG TERM GOAL #6   Title LBP and right peripheral symptoms improved by 50%    Status Achieved      PT LONG TERM GOAL #7   Title Lumbar  flexion and extension ROM improved 10 degrees needed for improved mobility as caregiver for her son    Time 8    Period Weeks    Status On-going                   Plan - 04/03/21 1353     Clinical Impression Statement The patient reports much improved neck pain and back pain.  Generally sleeping OK.  Some LE symptoms present but states much less intense than it was previously.  She reports some discomfort in right posterior hip and multiple tender points and taut bands identified.  Much improved soft tissue mobility following DN and manual therapy.  She is progressing well with rehab goals.  Will wait to follow up in 3-4 weeks if still doing well.    Personal Factors and Comorbidities Age;Comorbidity 2;Time since onset of injury/illness/exacerbation;Comorbidity 1    Comorbidities DM; CKD; carpal tunnel    Examination-Activity Limitations Caring for Others;Carry;Lift;Bend;Squat    Rehab Potential Good    PT Frequency 1x / week    PT Duration 8 weeks    PT Treatment/Interventions ADLs/Self Care Home Management;Biofeedback;Cryotherapy;Electrical Stimulation;Moist Heat;Traction;Therapeutic activities;Therapeutic exercise;Neuromuscular re-education;Patient/family education;Manual techniques;Passive range of motion;Dry needling;Taping;Aquatic Therapy;Ultrasound    PT Next Visit Plan recheck NDI and recert needed;  check cervical and lumbar ROM;  DN/ES to right lumbar; DN glutes, piriformis, cervical musculature;  1x/week;  plans to follow up after Christmas    PT Home Exercise Plan Access Code: [facility]    Recommended Other Services rerouted 2nd attempt for recert to be signed             Patient will benefit from skilled therapeutic intervention in order to improve the following deficits and impairments:  Pain, Increased fascial restricitons, Decreased strength, Decreased activity tolerance, Decreased range of motion, Increased muscle spasms, Decreased endurance, Impaired UE functional  use  Visit Diagnosis: Lumbar degenerative disc disease  Cervicalgia     Problem List Patient Active Problem List   Diagnosis Date Noted   Closed fracture of 9th & 10th ribs of right side 12/17/2016   Pneumothorax on right 12/17/2016   Lumbar degenerative disc disease 12/17/2016   Essential hypertension 12/17/2016   Dyslipidemia 10/07/2016   Pulmonary nodule 10/07/2016   Abnormal CXR    Chest pain 10/06/2016   S/P lumbar spinal fusion 08/05/2016   Obstructive sleep apnea syndrome, mild 10/27/2013   Controlled type 2 diabetes mellitus without complication, without long-term current use of insulin ([place]) 06/19/2011   GAD (generalized anxiety disorder) 11/01/2009   Ruben Im, PT 04/03/21 5:34 PM Phone: [phone] Fax: [phone]  Alvera Singh, PT 04/03/2021, 5:33 PM  [facility] @ [place], [place], [address] Phone: [phone]   Fax:  [phone]  Name: Krista Austin MRN: [id number] Date of Birth: Mar 11, 1946

## 2021-04-17 ENCOUNTER — Encounter: Payer: Medicare Other | Admitting: Physical Therapy

## 2021-04-29 ENCOUNTER — Ambulatory Visit: Payer: Medicare Other | Admitting: Physical Therapy

## 2021-05-13 ENCOUNTER — Other Ambulatory Visit: Payer: Self-pay

## 2021-05-13 ENCOUNTER — Ambulatory Visit: Payer: Medicare Other | Attending: Physician Assistant | Admitting: Physical Therapy

## 2021-05-13 DIAGNOSIS — M542 Cervicalgia: Secondary | ICD-10-CM | POA: Diagnosis present

## 2021-05-13 DIAGNOSIS — M51369 Other intervertebral disc degeneration, lumbar region without mention of lumbar back pain or lower extremity pain: Secondary | ICD-10-CM

## 2021-05-13 DIAGNOSIS — M5136 Other intervertebral disc degeneration, lumbar region: Secondary | ICD-10-CM | POA: Diagnosis present

## 2021-05-13 NOTE — Therapy (Signed)
[facility] @ [place], [place], [address] Phone: [phone]   Fax:  [phone]  Physical Therapy Treatment/Recertification  Patient Details  Name: Krista Austin MRN: [id number] Date of Birth: January 27, 1946 Referring Provider (PT): [name] PA   Encounter Date: 05/13/2021   PT End of Session - 05/13/21 1922     Visit Number 11    Date for PT Re-Evaluation 08/05/21    Authorization Type medicare A/B 20th visit prog note    Progress Note Due on Visit 20    PT Start Time 1010    PT Stop Time 1100    PT Time Calculation (min) 50 min    Activity Tolerance Patient tolerated treatment well             Past Medical History:  Diagnosis Date   A-fib ([place])    Adenomatous polyp    Arthritis    "mostly fingers" (10/07/2016)   At high risk for falls    Celiac disease    "I do not have the disease; 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" (10/07/2016)   Chronic back pain    "worse in my neck; lower back also affected" (10/07/2016)   Chronic cholecystitis 31/49/7026   Complication of anesthesia    "never fully regained my taste after my cervical fusion; I don't take much RX so I'm very sensitive to any sedation" (10/07/2016)   Concussion    X2   DDD (degenerative disc disease), cervical    lumbar   Depression    "hx; take Prozac to keep me stable" (10/07/2016)   Dizziness    caused by compression of cervical disc   Family history of adverse reaction to anesthesia    "daughters get PONV; one daughter breaks out severely; grandson got suspended in a state of anxiousness after kidney surgery; had to be held for hours"   GERD (gastroesophageal reflux disease)    Hyperlipidemia    OSA on CPAP    Pneumonia    "once as an adult" (10/07/2016)   Thyroid nodule    Type II diabetes mellitus ([facility])    controlled by diet and exercide    Past Surgical History:  Procedure Laterality Date   ANTERIOR CERVICAL DECOMP/DISCECTOMY FUSION   2015   BACK SURGERY     CARPAL TUNNEL RELEASE Bilateral    CATARACT EXTRACTION, BILATERAL Bilateral 09/10/2014 - 09/24/2014   by [place] N/A 10/16/2016   Procedure: LAPAROSCOPIC CHOLECYSTECTOMY;  Surgeon: [name], MD;  Location: [facility];  Service: General;  Laterality: N/A;   LAPAROSCOPIC CHOLECYSTECTOMY  10/16/2016   LUMBAR [place] SURGERY  2007   TONSILLECTOMY  05/05/1983   TUBAL LIGATION  02/09/1979    There were no vitals filed for this visit.   Subjective Assessment - 05/13/21 1024     Subjective I've been pretty good for about 6 weeks.  Overall neck and back about 50% better.  I walk every day (30 laps) 2-3 x/day.  It's a power walk.  My goal is to be able to take care of myself and [name] without outside help.    Pertinent History DM, stage III kidney; osteoporosis (mild)    How long can you walk comfortably? 15-20 minutes (initially only 1/2 mile)  50 laps .8 mile    Diagnostic tests MRI 02/04/21 C5-6 solid arthrodesis and no impingement; mild to moderat narrowing left C3-4 and right C 4-5, thoracic multilevel degenerative changes    Patient Stated  Goals do 2-3 hours of housework    Currently in Pain? Yes    Pain Score 2     Pain Location Hip    Pain Orientation Right    Pain Type Chronic pain    Pain Score 2    Pain Location Neck                [facility] PT Assessment - 05/13/21 0001       Observation/Other Assessments   Neck Disability Index  36%      AROM   Cervical Flexion 25    Cervical Extension 42    Cervical - Right Side Bend 25    Cervical - Left Side Bend 20    Cervical - Right Rotation 40    Cervical - Left Rotation 33    Lumbar Flexion 70    Lumbar Extension 12    Lumbar - Right Side Bend 30    Lumbar - Left Side Bend 25      Strength   Overall Strength Comments cervical and periscapular strength 4/5; lumbo/pelvic/hip strength grossly 4+/5                           [facility] Adult PT Treatment/Exercise -  05/13/21 0001       Ultrasound   Ultrasound Location bil cervcial    Ultrasound Parameters 1.0w/cm2 8 min 100%    Ultrasound Goals Pain      Manual Therapy   Soft tissue mobilization right gluteals, piriformis; bil cervical paraspinals, bil upper traps              Trigger Point Dry Needling - 05/13/21 0001     Consent Given? Yes    Other Dry Needling right    Upper Trapezius Response Twitch reponse elicited;Palpable increased muscle length    Cervical multifidi Response Palpable increased muscle length    Gluteus Minimus Response Palpable increased muscle length    Gluteus Medius Response Twitch response elicited;Palpable increased muscle length    Gluteus Maximus Response Twitch response elicited;Palpable increased muscle length    Piriformis Response Palpable increased muscle length                     PT Short Term Goals - 05/13/21 1935       PT SHORT TERM GOAL #1   Title Pt will be independent with her initial HEP to decrease pain/modulate symptoms    Status Achieved      PT SHORT TERM GOAL #2   Title Pt will report at least 30% decrease in neck pain and UE pain/numbness.    Status Achieved      PT SHORT TERM GOAL #3   Title Nexk Disability Index improved to 38%    Status Achieved      PT SHORT TERM GOAL #4   Title Cervical flexion improved to 40 degrees and extension to 50 degrees needed for grooming/dressing tasks    Status Partially Met               PT Long Term Goals - 05/13/21 1935       PT LONG TERM GOAL #1   Title The patient will be independent in self management techniques and strategies needed to take care of herself and her son    Time 61    Period Weeks    Status Revised    Target Date 08/05/21      PT LONG  TERM GOAL #2   Title Pt will report atleast 65% improvement in neck and UE paresthesia with daily activity or typically 3/10 with ADLs    Time 12    Period Weeks    Status Revised      PT LONG TERM GOAL #3    Title Improved cervical sidebending ROM improved to 40 degrees and rotation to 45 degrees needed for driving    Time 12    Period Weeks    Status On-going      PT LONG TERM GOAL #4   Title Neck Disability Index improved to 32% indicating improved function with less pain    Time 12    Period Weeks    Status Revised      PT LONG TERM GOAL #5   Title Cervical and periscapular strength improved to 4+/5 needed for lifting/ carrying bags    Time 12    Period Weeks    Status Achieved      PT LONG TERM GOAL #6   Title LBP and right peripheral symptoms improved by 65% or typically 3/10 with ADLs    Time 8    Period Weeks    Status Revised      PT LONG TERM GOAL #7   Title Lumbar flexion and extension ROM improved 10 degrees needed for improved mobility as caregiver for her son    Status Achieved                   Plan - 05/13/21 1923     Clinical Impression Statement Good improvements noted in overall pain intensity over the last 6 weeks for both neck and back. Decreased peripheral symptoms as well.  Increased ROM especially lumbar.  Neck Disability Index improved from 46% to 36%.  She would benefit from PT at a decreased frequency /tapering for further improvements in pain and return to function and reduce incidence of future flare ups.  She should meet remaining goals in 6 visits on a tapered basis.    Personal Factors and Comorbidities Age;Comorbidity 2;Time since onset of injury/illness/exacerbation;Comorbidity 1    Comorbidities DM; CKD; carpal tunnel    Examination-Activity Limitations Caring for Others;Carry;Lift;Bend;Squat    Stability/Clinical Decision Making Stable/Uncomplicated    Rehab Potential Good    PT Frequency Biweekly    PT Duration 12 weeks    PT Treatment/Interventions ADLs/Self Care Home Management;Biofeedback;Cryotherapy;Electrical Stimulation;Moist Heat;Traction;Therapeutic activities;Therapeutic exercise;Neuromuscular re-education;Patient/family  education;Manual techniques;Passive range of motion;Dry needling;Taping;Aquatic Therapy;Ultrasound    PT Next Visit Plan DN/ES to right lumbar; DN glutes, piriformis, cervical musculature; U/S; ex; patient education; tapered visits    PT Home Exercise Plan Access Code: [facility]             Patient will benefit from skilled therapeutic intervention in order to improve the following deficits and impairments:  Pain, Increased fascial restricitons, Decreased strength, Decreased activity tolerance, Decreased range of motion, Increased muscle spasms, Decreased endurance, Impaired UE functional use  Visit Diagnosis: Lumbar degenerative disc disease - Plan: PT plan of care cert/re-cert  Cervicalgia - Plan: PT plan of care cert/re-cert     Problem List Patient Active Problem List   Diagnosis Date Noted   Closed fracture of 9th & 10th ribs of right side 12/17/2016   Pneumothorax on right 12/17/2016   Lumbar degenerative disc disease 12/17/2016   Essential hypertension 12/17/2016   Dyslipidemia 10/07/2016   Pulmonary nodule 10/07/2016   Abnormal CXR    Chest pain 10/06/2016   S/P lumbar spinal  fusion 08/05/2016   Obstructive sleep apnea syndrome, mild 10/27/2013   Controlled type 2 diabetes mellitus without complication, without long-term current use of insulin ([place]) 06/19/2011   GAD (generalized anxiety disorder) 11/01/2009   Ruben Im, PT 05/13/21 7:43 PM Phone: [phone] Fax: [phone]  Alvera Singh, PT 05/13/2021, 7:43 PM  [facility] @ [place], [place], [address] Phone: [phone]   Fax:  [phone]  Name: AMANDY CHUBBUCK MRN: [id number] Date of Birth: [birth year]

## 2021-05-16 ENCOUNTER — Ambulatory Visit: Payer: Medicare Other | Admitting: Cardiology

## 2021-05-27 ENCOUNTER — Encounter: Payer: Medicare Other | Admitting: Physical Therapy

## 2021-06-05 ENCOUNTER — Ambulatory Visit: Payer: Medicare Other | Attending: Neurosurgery | Admitting: Physical Therapy

## 2021-06-05 ENCOUNTER — Other Ambulatory Visit: Payer: Self-pay

## 2021-06-05 DIAGNOSIS — M6281 Muscle weakness (generalized): Secondary | ICD-10-CM | POA: Insufficient documentation

## 2021-06-05 DIAGNOSIS — M5136 Other intervertebral disc degeneration, lumbar region: Secondary | ICD-10-CM | POA: Insufficient documentation

## 2021-06-05 NOTE — Therapy (Signed)
[facility] @ [address] [place], [place], [address] Phone: [phone]   Fax:  [phone]  Physical Therapy Treatment  Patient Details  Name: Krista Austin MRN: [id number] Date of Birth: 1945/06/20 Referring Provider (PT): [name] PA   Encounter Date: 06/05/2021   PT End of Session - 06/05/21 1424     Visit Number 12    Date for PT Re-Evaluation 08/05/21    Authorization Type medicare A/B 20th visit prog note    Progress Note Due on Visit 75    PT Start Time 1400    PT Stop Time 1440   DN, ES   PT Time Calculation (min) 40 min    Activity Tolerance Patient tolerated treatment well             Past Medical History:  Diagnosis Date   A-fib ([place])    Adenomatous polyp    Arthritis    "mostly fingers" (10/07/2016)   At high risk for falls    Celiac disease    "I do not have the disease; 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" (10/07/2016)   Chronic back pain    "worse in my neck; lower back also affected" (10/07/2016)   Chronic cholecystitis 71/69/6789   Complication of anesthesia    "never fully regained my taste after my cervical fusion; I don't take much RX so I'm very sensitive to any sedation" (10/07/2016)   Concussion    X2   DDD (degenerative disc disease), cervical    lumbar   Depression    "hx; take Prozac to keep me stable" (10/07/2016)   Dizziness    caused by compression of cervical disc   Family history of adverse reaction to anesthesia    "daughters get PONV; one daughter breaks out severely; grandson got suspended in a state of anxiousness after kidney surgery; had to be held for hours"   GERD (gastroesophageal reflux disease)    Hyperlipidemia    OSA on CPAP    Pneumonia    "once as an adult" (10/07/2016)   Thyroid nodule    Type II diabetes mellitus ([place])    controlled by diet and exercide    Past Surgical History:  Procedure Laterality Date   ANTERIOR CERVICAL DECOMP/DISCECTOMY FUSION  2015    BACK SURGERY     CARPAL TUNNEL RELEASE Bilateral    CATARACT EXTRACTION, BILATERAL Bilateral 09/10/2014 - 09/24/2014   by [place] N/A 10/16/2016   Procedure: LAPAROSCOPIC CHOLECYSTECTOMY;  Surgeon: [name], MD;  Location: [facility];  Service: General;  Laterality: N/A;   LAPAROSCOPIC CHOLECYSTECTOMY  10/16/2016   LUMBAR [place] SURGERY  2007   TONSILLECTOMY  05/05/1983   TUBAL LIGATION  02/09/1979    There were no vitals filed for this visit.   Subjective Assessment - 06/05/21 1400     Subjective [name] is fighting long covid.  My right hip is the main issue. My neck has been great.    Pertinent History DM, stage III kidney; osteoporosis (mild)    Limitations Sitting;Standing    How long can you walk comfortably? 15-20 minutes (initially only 1/2 mile)  50 laps .8 mile    Diagnostic tests MRI 02/04/21 C5-6 solid arthrodesis and no impingement; mild to moderat narrowing left C3-4 and right C 4-5, thoracic multilevel degenerative changes    Patient Stated Goals do 2-3 hours of housework    Currently in Pain? Yes    Pain Score 5  Pain Location Hip    Pain Orientation Right                               [facility] Adult PT Treatment/Exercise - 06/05/21 0001       Moist Heat Therapy   Number Minutes Moist Heat 3 Minutes    Moist Heat Location Lumbar Spine;Hip      Electrical Stimulation   Electrical Stimulation Location bil lumbar multifidi in L4-/S1 and gluteal region    Electrical Stimulation Action 2.0 ma    Electrical Stimulation Parameters 8 min    Electrical Stimulation Goals Pain      Manual Therapy   Soft tissue mobilization right gluteals and piriformis; right lumbar paraspinals              Trigger Point Dry Needling - 06/05/21 0001     Electrical Stimulation Performed with Dry Needling Yes    E-stim with Dry Needling Details right    Other Dry Needling right    Gluteus Minimus Response Palpable increased muscle  length    Gluteus Medius Response Palpable increased muscle length    Gluteus Maximus Response Twitch response elicited;Palpable increased muscle length    Piriformis Response Palpable increased muscle length    Lumbar multifidi Response Palpable increased muscle length                     PT Short Term Goals - 05/13/21 1935       PT SHORT TERM GOAL #1   Title Pt will be independent with her initial HEP to decrease pain/modulate symptoms    Status Achieved      PT SHORT TERM GOAL #2   Title Pt will report at least 30% decrease in neck pain and UE pain/numbness.    Status Achieved      PT SHORT TERM GOAL #3   Title Nexk Disability Index improved to 38%    Status Achieved      PT SHORT TERM GOAL #4   Title Cervical flexion improved to 40 degrees and extension to 50 degrees needed for grooming/dressing tasks    Status Partially Met               PT Long Term Goals - 05/13/21 1935       PT LONG TERM GOAL #1   Title The patient will be independent in self management techniques and strategies needed to take care of herself and her son    Time 40    Period Weeks    Status Revised    Target Date 08/05/21      PT LONG TERM GOAL #2   Title Pt will report atleast 65% improvement in neck and UE paresthesia with daily activity or typically 3/10 with ADLs    Time 12    Period Weeks    Status Revised      PT LONG TERM GOAL #3   Title Improved cervical sidebending ROM improved to 40 degrees and rotation to 45 degrees needed for driving    Time 12    Period Weeks    Status On-going      PT LONG TERM GOAL #4   Title Neck Disability Index improved to 32% indicating improved function with less pain    Time 12    Period Weeks    Status Revised      PT LONG TERM GOAL #5   Title Cervical  and periscapular strength improved to 4+/5 needed for lifting/ carrying bags    Time 12    Period Weeks    Status Achieved      PT LONG TERM GOAL #6   Title LBP and right  peripheral symptoms improved by 65% or typically 3/10 with ADLs    Time 8    Period Weeks    Status Revised      PT LONG TERM GOAL #7   Title Lumbar flexion and extension ROM improved 10 degrees needed for improved mobility as caregiver for her son    Status Achieved                   Plan - 06/05/21 1428     Clinical Impression Statement The patient reports her neck has been doing well but has had recent issues with right radicular-like symptoms.  Multiple tender points in right gluteals and piriformis as well as right lumbar multifidi.  Good response to DN combined with ES in L4-5 pattern with decreased post treatment pain levels.  Therapist monitoring response with all interventions.    Personal Factors and Comorbidities Age;Comorbidity 2;Time since onset of injury/illness/exacerbation;Comorbidity 1    Comorbidities DM; CKD; carpal tunnel    Examination-Activity Limitations Caring for Others;Carry;Lift;Bend;Squat    Rehab Potential Good    PT Frequency Biweekly    PT Duration 12 weeks    PT Treatment/Interventions ADLs/Self Care Home Management;Biofeedback;Cryotherapy;Electrical Stimulation;Moist Heat;Traction;Therapeutic activities;Therapeutic exercise;Neuromuscular re-education;Patient/family education;Manual techniques;Passive range of motion;Dry needling;Taping;Aquatic Therapy;Ultrasound    PT Next Visit Plan DN/ES to right lumbar; DN glutes, piriformis, cervical musculature; U/S; ex; patient education; tapered visits    PT Home Exercise Plan Access Code: [facility]             Patient will benefit from skilled therapeutic intervention in order to improve the following deficits and impairments:  Pain, Increased fascial restricitons, Decreased strength, Decreased activity tolerance, Decreased range of motion, Increased muscle spasms, Decreased endurance, Impaired UE functional use  Visit Diagnosis: Lumbar degenerative disc disease  Muscle weakness  (generalized)     Problem List Patient Active Problem List   Diagnosis Date Noted   Closed fracture of 9th & 10th ribs of right side 12/17/2016   Pneumothorax on right 12/17/2016   Lumbar degenerative disc disease 12/17/2016   Essential hypertension 12/17/2016   Dyslipidemia 10/07/2016   Pulmonary nodule 10/07/2016   Abnormal CXR    Chest pain 10/06/2016   S/P lumbar spinal fusion 08/05/2016   Obstructive sleep apnea syndrome, mild 10/27/2013   Controlled type 2 diabetes mellitus without complication, without long-term current use of insulin ([place]) 06/19/2011   GAD (generalized anxiety disorder) 11/01/2009   Ruben Im, PT 06/05/21 2:40 PM Phone: [phone] Fax: [phone]  Alvera Singh, PT 06/05/2021, 2:40 PM  [facility] @ [address] [place] [place], [place], [address] Phone: [phone]   Fax:  [phone]  Name: SELENA SWAMINATHAN MRN: [id number] Date of Birth: [birth year]

## 2021-06-10 ENCOUNTER — Other Ambulatory Visit: Payer: Self-pay | Admitting: *Deleted

## 2021-06-10 DIAGNOSIS — Z87891 Personal history of nicotine dependence: Secondary | ICD-10-CM

## 2021-06-24 ENCOUNTER — Ambulatory Visit: Payer: Medicare Other | Admitting: Physical Therapy

## 2021-06-30 ENCOUNTER — Ambulatory Visit
Admission: RE | Admit: 2021-06-30 | Discharge: 2021-06-30 | Disposition: A | Discharge status: Home / Self Care | Payer: Medicare Other | Source: Ambulatory Visit | Attending: Acute Care | Admitting: Acute Care

## 2021-06-30 ENCOUNTER — Other Ambulatory Visit: Payer: Self-pay

## 2021-06-30 DIAGNOSIS — Z87891 Personal history of nicotine dependence: Secondary | ICD-10-CM

## 2021-07-01 ENCOUNTER — Other Ambulatory Visit: Payer: Self-pay

## 2021-07-01 ENCOUNTER — Telehealth: Payer: Self-pay | Admitting: Acute Care

## 2021-07-01 DIAGNOSIS — Z87891 Personal history of nicotine dependence: Secondary | ICD-10-CM

## 2021-07-01 NOTE — Telephone Encounter (Signed)
Contacted patient re: LDCT results.  No suspicious findings for lung cancer.  Findings noted for emphysema, atherosclerosis and thyroid nodule.  Recommendation for thyroid US.  Patient has known goiter and sees endocrinologist regularly.  She states she has thyroid US often and requests we send her Krista Racer, MD for endocrinology in [place].  Her PCP has changed address to [facility] at [address] in [place] [address]. A copy of results has been sent to both providers.  Patient last saw endocrinology in Oct 2022.  Patient had no further questions.

## 2021-07-08 ENCOUNTER — Ambulatory Visit: Payer: Medicare Other | Attending: Physician Assistant | Admitting: Physical Therapy

## 2021-07-08 ENCOUNTER — Other Ambulatory Visit: Payer: Self-pay

## 2021-07-08 DIAGNOSIS — M6281 Muscle weakness (generalized): Secondary | ICD-10-CM | POA: Diagnosis present

## 2021-07-08 DIAGNOSIS — M5136 Other intervertebral disc degeneration, lumbar region: Secondary | ICD-10-CM | POA: Diagnosis not present

## 2021-07-08 DIAGNOSIS — M542 Cervicalgia: Secondary | ICD-10-CM

## 2021-07-08 DIAGNOSIS — M51369 Other intervertebral disc degeneration, lumbar region without mention of lumbar back pain or lower extremity pain: Secondary | ICD-10-CM

## 2021-07-08 NOTE — Therapy (Signed)
[facility] @ [place] [address] [place], [place], [address] Phone: [phone]   Fax:  [phone]  Physical Therapy Treatment  Patient Details  Name: Krista Austin MRN: [id number] Date of Birth: 08-03-1945 Referring Provider (PT): [name] PA   Encounter Date: 07/08/2021   PT End of Session - 07/08/21 1102     Visit Number 13    Date for PT Re-Evaluation 08/05/21    Authorization Type medicare A/B 20th visit prog note    Progress Note Due on Visit 1    PT Start Time 1100    PT Stop Time 1140    PT Time Calculation (min) 40 min    Activity Tolerance Patient tolerated treatment well             Past Medical History:  Diagnosis Date   A-fib ([place])    Adenomatous polyp    Arthritis    "mostly fingers" (10/07/2016)   At high risk for falls    Celiac disease    "I do not have the disease; 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" (10/07/2016)   Chronic back pain    "worse in my neck; lower back also affected" (10/07/2016)   Chronic cholecystitis 79/07/8331   Complication of anesthesia    "never fully regained my taste after my cervical fusion; I don't take much RX so I'm very sensitive to any sedation" (10/07/2016)   Concussion    X2   DDD (degenerative disc disease), cervical    lumbar   Depression    "hx; take Prozac to keep me stable" (10/07/2016)   Dizziness    caused by compression of cervical disc   Family history of adverse reaction to anesthesia    "daughters get PONV; one daughter breaks out severely; grandson got suspended in a state of anxiousness after kidney surgery; had to be held for hours"   GERD (gastroesophageal reflux disease)    Hyperlipidemia    OSA on CPAP    Pneumonia    "once as an adult" (10/07/2016)   Thyroid nodule    Type II diabetes mellitus ([place])    controlled by diet and exercide    Past Surgical History:  Procedure Laterality Date   ANTERIOR CERVICAL DECOMP/DISCECTOMY FUSION  2015   BACK  SURGERY     CARPAL TUNNEL RELEASE Bilateral    CATARACT EXTRACTION, BILATERAL Bilateral 09/10/2014 - 09/24/2014   by [place] N/A 10/16/2016   Procedure: LAPAROSCOPIC CHOLECYSTECTOMY;  Surgeon: [name], MD;  Location: [facility];  Service: General;  Laterality: N/A;   LAPAROSCOPIC CHOLECYSTECTOMY  10/16/2016   LUMBAR [place] SURGERY  2007   TONSILLECTOMY  05/05/1983   TUBAL LIGATION  02/09/1979    There were no vitals filed for this visit.   Subjective Assessment - 07/08/21 1102     Subjective Had a lung scan and identified thoracic spondylosis.  I haven't had any leg pain since I was here last time!    Pertinent History DM, stage III kidney; osteoporosis (mild)    Diagnostic tests MRI 02/04/21 C5-6 solid arthrodesis and no impingement; mild to moderat narrowing left C3-4 and right C 4-5, thoracic multilevel degenerative changes                               [facility] Adult PT Treatment/Exercise - 07/08/21 0001       Self-Care   Self-Care Other Self-Care Comments  Other Self-Care Comments  discussion of imaging results for thoracic spondylosis      Moist Heat Therapy   Moist Heat Location Lumbar Spine      Electrical Stimulation   Electrical Stimulation Location bil thoracic  multifidi in L4-/S1 and gluteal region    Electrical Stimulation Action 2.0 ma    Electrical Stimulation Parameters 10 min    Electrical Stimulation Goals Pain      Manual Therapy   Soft tissue mobilization bil thoracic multifidi, bil upper traps, cervical paraspinals, suboccipitals              Trigger Point Dry Needling - 07/08/21 0001     Electrical Stimulation Performed with Dry Needling Yes    E-stim with Dry Needling Details bil thoracic multifidi    Suboccipitals Response Twitch response elicited;Palpable increased muscle length    Cervical multifidi Response Twitch reponse elicited;Palpable increased muscle length    Gluteus Minimus Response --     Gluteus Medius Response --    Gluteus Maximus Response --    Piriformis Response --                     PT Short Term Goals - 05/13/21 1935       PT SHORT TERM GOAL #1   Title Pt will be independent with her initial HEP to decrease pain/modulate symptoms    Status Achieved      PT SHORT TERM GOAL #2   Title Pt will report at least 30% decrease in neck pain and UE pain/numbness.    Status Achieved      PT SHORT TERM GOAL #3   Title Nexk Disability Index improved to 38%    Status Achieved      PT SHORT TERM GOAL #4   Title Cervical flexion improved to 40 degrees and extension to 50 degrees needed for grooming/dressing tasks    Status Partially Met               PT Long Term Goals - 05/13/21 1935       PT LONG TERM GOAL #1   Title The patient will be independent in self management techniques and strategies needed to take care of herself and her son    Time 106    Period Weeks    Status Revised    Target Date 08/05/21      PT LONG TERM GOAL #2   Title Pt will report atleast 65% improvement in neck and UE paresthesia with daily activity or typically 3/10 with ADLs    Time 12    Period Weeks    Status Revised      PT LONG TERM GOAL #3   Title Improved cervical sidebending ROM improved to 40 degrees and rotation to 45 degrees needed for driving    Time 12    Period Weeks    Status On-going      PT LONG TERM GOAL #4   Title Neck Disability Index improved to 32% indicating improved function with less pain    Time 12    Period Weeks    Status Revised      PT LONG TERM GOAL #5   Title Cervical and periscapular strength improved to 4+/5 needed for lifting/ carrying bags    Time 12    Period Weeks    Status Achieved      PT LONG TERM GOAL #6   Title LBP and right peripheral symptoms improved by 65% or  typically 3/10 with ADLs    Time 8    Period Weeks    Status Revised      PT LONG TERM GOAL #7   Title Lumbar flexion and extension ROM improved 10  degrees needed for improved mobility as caregiver for her son    Status Achieved                   Plan - 07/08/21 1453     Clinical Impression Statement The patient returns reporting significant improvement in LE symptoms since last visit.  Treatment focus on thoraco/lumbar and cervical  regions to address myofascial pain that radiates around ribcage.  Good response to DN combined with ES with improved soft tissue mobility and reduced thoracic kyphosis.  Therapist monitoring response throughout treatment session.    Personal Factors and Comorbidities Age;Comorbidity 2;Time since onset of injury/illness/exacerbation;Comorbidity 1    Comorbidities DM; CKD; carpal tunnel    Examination-Activity Limitations Caring for Others;Carry;Lift;Bend;Squat    Rehab Potential Good    PT Frequency Biweekly    PT Duration 12 weeks    PT Treatment/Interventions ADLs/Self Care Home Management;Biofeedback;Cryotherapy;Electrical Stimulation;Moist Heat;Traction;Therapeutic activities;Therapeutic exercise;Neuromuscular re-education;Patient/family education;Manual techniques;Passive range of motion;Dry needling;Taping;Aquatic Therapy;Ultrasound    PT Next Visit Plan DN/ES to right thoraco/ lumbar; DN glutes, piriformis, cervical musculature as needed; U/S; ex; patient education; tapered visits;  check ROM next visit    PT Home Exercise Plan Access Code: [facility]             Patient will benefit from skilled therapeutic intervention in order to improve the following deficits and impairments:  Pain, Increased fascial restricitons, Decreased strength, Decreased activity tolerance, Decreased range of motion, Increased muscle spasms, Decreased endurance, Impaired UE functional use  Visit Diagnosis: Lumbar degenerative disc disease  Muscle weakness (generalized)  Cervicalgia     Problem List Patient Active Problem List   Diagnosis Date Noted   Closed fracture of 9th & 10th ribs of right side  12/17/2016   Pneumothorax on right 12/17/2016   Lumbar degenerative disc disease 12/17/2016   Essential hypertension 12/17/2016   Dyslipidemia 10/07/2016   Pulmonary nodule 10/07/2016   Abnormal CXR    Chest pain 10/06/2016   S/P lumbar spinal fusion 08/05/2016   Obstructive sleep apnea syndrome, mild 10/27/2013   Controlled type 2 diabetes mellitus without complication, without long-term current use of insulin ([place]) 06/19/2011   GAD (generalized anxiety disorder) 11/01/2009    Alvera Singh, PT 07/08/2021, 2:59 PM  [facility] @ [place], [place], [address] Phone: [phone]   Fax:  [phone]  Name: Krista Austin MRN: [id number] Date of Birth: 1945/05/27

## 2021-07-08 NOTE — Therapy (Signed)
[facility] @ [address] [place], [place], [address] Phone: [phone]   Fax:  [phone]  Physical Therapy Treatment  Patient Details  Name: Krista Austin MRN: [id number] Date of Birth: [birth year] Referring Provider (PT): [name] PA   Encounter Date: 07/08/2021   PT End of Session - 07/08/21 1102     Visit Number 13    Date for PT Re-Evaluation 08/05/21    Authorization Type medicare A/B 20th visit prog note    Progress Note Due on Visit 9    PT Start Time 1100    PT Stop Time 1140    PT Time Calculation (min) 40 min    Activity Tolerance Patient tolerated treatment well             Past Medical History:  Diagnosis Date   A-fib ([place])    Adenomatous polyp    Arthritis    "mostly fingers" (10/07/2016)   At high risk for falls    Celiac disease    "I do not have the disease; 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" (10/07/2016)   Chronic back pain    "worse in my neck; lower back also affected" (10/07/2016)   Chronic cholecystitis 02/77/4128   Complication of anesthesia    "never fully regained my taste after my cervical fusion; I don't take much RX so I'm very sensitive to any sedation" (10/07/2016)   Concussion    X2   DDD (degenerative disc disease), cervical    lumbar   Depression    "hx; take Prozac to keep me stable" (10/07/2016)   Dizziness    caused by compression of cervical disc   Family history of adverse reaction to anesthesia    "daughters get PONV; one daughter breaks out severely; grandson got suspended in a state of anxiousness after kidney surgery; had to be held for hours"   GERD (gastroesophageal reflux disease)    Hyperlipidemia    OSA on CPAP    Pneumonia    "once as an adult" (10/07/2016)   Thyroid nodule    Type II diabetes mellitus ([place])    controlled by diet and exercide    Past Surgical History:  Procedure Laterality Date   ANTERIOR CERVICAL DECOMP/DISCECTOMY FUSION  2015   BACK  SURGERY     CARPAL TUNNEL RELEASE Bilateral    CATARACT EXTRACTION, BILATERAL Bilateral 09/10/2014 - 09/24/2014   by [place] N/A 10/16/2016   Procedure: LAPAROSCOPIC CHOLECYSTECTOMY;  Surgeon: [name], MD;  Location: [facility];  Service: General;  Laterality: N/A;   LAPAROSCOPIC CHOLECYSTECTOMY  10/16/2016   LUMBAR [place] SURGERY  2007   TONSILLECTOMY  05/05/1983   TUBAL LIGATION  02/09/1979    There were no vitals filed for this visit.   Subjective Assessment - 07/08/21 1102     Subjective Had a lung scan and identified thoracic spondylosis.  I haven't had any leg pain since I was here last time!    Pertinent History DM, stage III kidney; osteoporosis (mild)    Diagnostic tests MRI 02/04/21 C5-6 solid arthrodesis and no impingement; mild to moderat narrowing left C3-4 and right C 4-5, thoracic multilevel degenerative changes    Currently in Pain? Yes    Pain Score 3     Pain Location Neck    Pain Orientation Right;Left    Pain Type Chronic pain    Pain Score 4    Pain Location Thoracic    Pain Orientation Right;Left  [facility] Adult PT Treatment/Exercise - 07/08/21 0001       Self-Care   Self-Care Other Self-Care Comments    Other Self-Care Comments  discussion of imaging results for thoracic spondylosis      Moist Heat Therapy   Moist Heat Location Lumbar Spine      Electrical Stimulation   Electrical Stimulation Location bil thoracic  multifidi in L4-/S1 and gluteal region    Electrical Stimulation Action 2.0 ma    Electrical Stimulation Parameters 10 min    Electrical Stimulation Goals Pain      Manual Therapy   Soft tissue mobilization bil thoracic multifidi, bil upper traps, cervical paraspinals, suboccipitals              Trigger Point Dry Needling - 07/08/21 0001     Electrical Stimulation Performed with Dry Needling Yes    E-stim with Dry Needling Details bil thoracic multifidi     Suboccipitals Response Twitch response elicited;Palpable increased muscle length    Cervical multifidi Response Twitch reponse elicited;Palpable increased muscle length    Gluteus Minimus Response --    Gluteus Medius Response --    Gluteus Maximus Response --    Piriformis Response --                     PT Short Term Goals - 05/13/21 1935       PT SHORT TERM GOAL #1   Title Pt will be independent with her initial HEP to decrease pain/modulate symptoms    Status Achieved      PT SHORT TERM GOAL #2   Title Pt will report at least 30% decrease in neck pain and UE pain/numbness.    Status Achieved      PT SHORT TERM GOAL #3   Title Nexk Disability Index improved to 38%    Status Achieved      PT SHORT TERM GOAL #4   Title Cervical flexion improved to 40 degrees and extension to 50 degrees needed for grooming/dressing tasks    Status Partially Met               PT Long Term Goals - 05/13/21 1935       PT LONG TERM GOAL #1   Title The patient will be independent in self management techniques and strategies needed to take care of herself and her son    Time 72    Period Weeks    Status Revised    Target Date 08/05/21      PT LONG TERM GOAL #2   Title Pt will report atleast 65% improvement in neck and UE paresthesia with daily activity or typically 3/10 with ADLs    Time 12    Period Weeks    Status Revised      PT LONG TERM GOAL #3   Title Improved cervical sidebending ROM improved to 40 degrees and rotation to 45 degrees needed for driving    Time 12    Period Weeks    Status On-going      PT LONG TERM GOAL #4   Title Neck Disability Index improved to 32% indicating improved function with less pain    Time 12    Period Weeks    Status Revised      PT LONG TERM GOAL #5   Title Cervical and periscapular strength improved to 4+/5 needed for lifting/ carrying bags    Time 12    Period Weeks    Status  Achieved      PT LONG TERM GOAL #6   Title  LBP and right peripheral symptoms improved by 65% or typically 3/10 with ADLs    Time 8    Period Weeks    Status Revised      PT LONG TERM GOAL #7   Title Lumbar flexion and extension ROM improved 10 degrees needed for improved mobility as caregiver for her son    Status Achieved                   Plan - 07/08/21 1453     Clinical Impression Statement The patient returns reporting significant improvement in LE symptoms since last visit.  Treatment focus on thoraco/lumbar and cervical  regions to address myofascial pain that radiates around ribcage.  Good response to DN combined with ES with improved soft tissue mobility and reduced thoracic kyphosis.  Therapist monitoring response throughout treatment session.    Personal Factors and Comorbidities Age;Comorbidity 2;Time since onset of injury/illness/exacerbation;Comorbidity 1    Comorbidities DM; CKD; carpal tunnel    Examination-Activity Limitations Caring for Others;Carry;Lift;Bend;Squat    Rehab Potential Good    PT Frequency Biweekly    PT Duration 12 weeks    PT Treatment/Interventions ADLs/Self Care Home Management;Biofeedback;Cryotherapy;Electrical Stimulation;Moist Heat;Traction;Therapeutic activities;Therapeutic exercise;Neuromuscular re-education;Patient/family education;Manual techniques;Passive range of motion;Dry needling;Taping;Aquatic Therapy;Ultrasound    PT Next Visit Plan DN/ES to right thoraco/ lumbar; DN glutes, piriformis, cervical musculature as needed; U/S; ex; patient education; tapered visits;  check ROM next visit    PT Home Exercise Plan Access Code: [employer]             Patient will benefit from skilled therapeutic intervention in order to improve the following deficits and impairments:  Pain, Increased fascial restricitons, Decreased strength, Decreased activity tolerance, Decreased range of motion, Increased muscle spasms, Decreased endurance, Impaired UE functional use  Visit Diagnosis: Lumbar  degenerative disc disease  Muscle weakness (generalized)  Cervicalgia     Problem List Patient Active Problem List   Diagnosis Date Noted   Closed fracture of 9th & 10th ribs of right side 12/17/2016   Pneumothorax on right 12/17/2016   Lumbar degenerative disc disease 12/17/2016   Essential hypertension 12/17/2016   Dyslipidemia 10/07/2016   Pulmonary nodule 10/07/2016   Abnormal CXR    Chest pain 10/06/2016   S/P lumbar spinal fusion 08/05/2016   Obstructive sleep apnea syndrome, mild 10/27/2013   Controlled type 2 diabetes mellitus without complication, without long-term current use of insulin ([place]) 06/19/2011   GAD (generalized anxiety disorder) 11/01/2009   Ruben Im, PT 07/08/21 3:01 PM Phone: [phone] Fax: [phone]  Alvera Singh, PT 07/08/2021, 3:00 PM  [facility] @ [address] [place], [place], [address] Phone: [phone]   Fax:  [phone]  Name: Krista Austin MRN: [id number] Date of Birth: [birth year]

## 2021-07-22 ENCOUNTER — Ambulatory Visit: Payer: Medicare Other | Admitting: Physical Therapy

## 2021-07-22 ENCOUNTER — Other Ambulatory Visit: Payer: Self-pay

## 2021-07-22 DIAGNOSIS — M5136 Other intervertebral disc degeneration, lumbar region: Secondary | ICD-10-CM

## 2021-07-22 DIAGNOSIS — M542 Cervicalgia: Secondary | ICD-10-CM

## 2021-07-22 DIAGNOSIS — M6281 Muscle weakness (generalized): Secondary | ICD-10-CM

## 2021-07-22 NOTE — Therapy (Signed)
[facility] [facility], [place], [address] ?Phone: [phone]   Fax:  [phone] ? ?Physical Therapy Treatment ? ?Patient Details  ?Name: Krista Austin ?MRN: [id number] ?Date of Birth: 1946/03/29 ?Referring Provider (PT): [name] PA ? ? ?Encounter Date: 07/22/2021 ? ? PT End of Session - 07/22/21 1101   ? ? Visit Number 14   ? Date for PT Re-Evaluation 08/05/21   ? Authorization Type medicare A/B 20th visit prog note   ? Progress Note Due on Visit 20   ? PT Start Time 1015   ? PT Stop Time 1055   ? PT Time Calculation (min) 40 min   ? Activity Tolerance Patient tolerated treatment well   ? ?  ?  ? ?  ? ? ?Past Medical History:  ?Diagnosis Date  ? A-fib ([place])   ? Adenomatous polyp   ? Arthritis   ? "mostly fingers" (10/07/2016)  ? At high risk for falls   ? Celiac disease   ? 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 Chronic back pain   ? "worse in my neck; lower back also affected" (10/07/2016)  ? Chronic cholecystitis 10/16/2016  ? Complication of anesthesia   ? "never fully regained my taste after my cervical fusion; I don't take much RX so I'm very sensitive to any sedation" (10/07/2016)  ? Concussion   ? X2  ? DDD (degenerative disc disease), cervical   ? lumbar  ? Depression   ? "hx; take Prozac to keep me stable" (10/07/2016)  ? Dizziness   ? caused by compression of cervical disc  ? Family history of adverse reaction to anesthesia   ? "daughters get PONV; one daughter breaks out severely; grandson got suspended in a state of anxiousness after kidney surgery; had to be held for hours"  ? GERD (gastroesophageal reflux disease)   ? Hyperlipidemia   ? OSA on CPAP   ? Pneumonia   ? "once as an adult" (10/07/2016)  ? Thyroid nodule   ? Type II diabetes mellitus ([place])   ? controlled by diet and exercide  ? ? ?Past Surgical History:  ?Procedure Laterality Date  ? ANTERIOR CERVICAL DECOMP/DISCECTOMY FUSION  2015  ? BACK  SURGERY    ? CARPAL TUNNEL RELEASE Bilateral   ? CATARACT EXTRACTION, BILATERAL Bilateral 09/10/2014 - 09/24/2014  ? by Darleen Crocker  ? CHOLECYSTECTOMY N/A 10/16/2016  ? Procedure: LAPAROSCOPIC CHOLECYSTECTOMY;  Surgeon: [name], MD;  Location: [facility];  Service: General;  Laterality: N/A;  ? LAPAROSCOPIC CHOLECYSTECTOMY  10/16/2016  ? [facility] SURGERY  2007  ? TONSILLECTOMY  05/05/1983  ? TUBAL LIGATION  02/09/1979  ? ? ?There were no vitals filed for this visit. ? ? Subjective Assessment - 07/22/21 1019   ? ? Subjective I haven't had that severe pain since last treatment in the thoracic area.  Right neck and UE has been uncomfortable.   ? Pertinent History DM, stage III kidney; osteoporosis (mild)   ? How long can you walk comfortably? 15-20 minutes (initially only 1/2 mile)  50 laps .8 mile   ? Diagnostic tests MRI 02/04/21 C5-6 solid arthrodesis and no impingement; mild to moderat narrowing left C3-4 and right C 4-5, thoracic multilevel degenerative changes   ? Patient Stated Goals do 2-3 hours of housework   ? Currently in Pain? Yes   ? Pain Score 3    ? Pain Location Neck   ?  Pain Orientation Right;Left   ? Pain Type Chronic pain   ? ?  ?  ? ?  ? ? ? ? ? [facility] PT Assessment - 07/22/21 0001   ? ?  ? Observation/Other Assessments  ? Neck Disability Index  36%   ?  ? AROM  ? Cervical Flexion 40   ? Cervical Extension 50   ? Cervical - Right Side Bend 25   ? Cervical - Left Side Bend 22   ? Cervical - Right Rotation 40   ? Cervical - Left Rotation 30   ? Lumbar Flexion 70   ? Lumbar Extension 12   ? Lumbar - Right Side Bend 30   ? Lumbar - Left Side Bend 25   ? ?  ?  ? ?  ? ? ? ? ? ? ? ? ? ? ? ? ? ? ? ? [facility] Adult PT Treatment/Exercise - 07/22/21 0001   ? ?  ? Lumbar Exercises: Seated  ? Other Seated Lumbar Exercises review of ROM measurements and areas of focus   ?  ? Electrical Stimulation  ? Electrical Stimulation Location right cervical multifidi in L4-/S1 and gluteal region   ? Electrical Stimulation  Action 2.5 ma   ? Electrical Stimulation Parameters 10 min   ? Electrical Stimulation Goals Pain   ?  ? Manual Therapy  ? Soft tissue mobilization bil cervical multifidi, bil upper traps, cervical paraspinals, suboccipitals   ? ?  ?  ? ?  ? ? ? Trigger Point Dry Needling - 07/22/21 0001   ? ? Electrical Stimulation Performed with Dry Needling Yes   ? E-stim with Dry Needling Details right cervical multifidi   ? Suboccipitals Response Twitch response elicited;Palpable increased muscle length   ? Cervical multifidi Response Twitch reponse elicited;Palpable increased muscle length   ? ?  ?  ? ?  ? ? ? ? ? ? ? ? ? ? PT Short Term Goals - 05/13/21 1935   ? ?  ? PT SHORT TERM GOAL #1  ? Title Pt will be independent with her initial HEP to decrease pain/modulate symptoms   ? Status Achieved   ?  ? PT SHORT TERM GOAL #2  ? Title Pt will report at least 30% decrease in neck pain and UE pain/numbness.   ? Status Achieved   ?  ? PT SHORT TERM GOAL #3  ? Title Nexk Disability Index improved to 38%   ? Status Achieved   ?  ? PT SHORT TERM GOAL #4  ? Title Cervical flexion improved to 40 degrees and extension to 50 degrees needed for grooming/dressing tasks   ? Status Partially Met   ? ?  ?  ? ?  ? ? ? ? PT Long Term Goals - 05/13/21 1935   ? ?  ? PT LONG TERM GOAL #1  ? Title The patient will be independent in self management techniques and strategies needed to take care of herself and her son   ? Time 12   ? Period Weeks   ? Status Revised   ? Target Date 08/05/21   ?  ? PT LONG TERM GOAL #2  ? Title Pt will report atleast 65% improvement in neck and UE paresthesia with daily activity or typically 3/10 with ADLs   ? Time 12   ? Period Weeks   ? Status Revised   ?  ? PT LONG TERM GOAL #3  ? Title Improved cervical sidebending ROM  improved to 40 degrees and rotation to 45 degrees needed for driving   ? Time 12   ? Period Weeks   ? Status On-going   ?  ? PT LONG TERM GOAL #4  ? Title Neck Disability Index improved to 32%  indicating improved function with less pain   ? Time 12   ? Period Weeks   ? Status Revised   ?  ? PT LONG TERM GOAL #5  ? Title Cervical and periscapular strength improved to 4+/5 needed for lifting/ carrying bags   ? Time 12   ? Period Weeks   ? Status Achieved   ?  ? PT LONG TERM GOAL #6  ? Title LBP and right peripheral symptoms improved by 65% or typically 3/10 with ADLs   ? Time 8   ? Period Weeks   ? Status Revised   ?  ? PT LONG TERM GOAL #7  ? Title Lumbar flexion and extension ROM improved 10 degrees needed for improved mobility as caregiver for her son   ? Status Achieved   ? ?  ?  ? ?  ? ? ? ? ? ? ? ? Plan - 07/22/21 1712   ? ? Clinical Impression Statement Good improvements with cervical ROM especially flexion and sidebending although still limited and asymmetrical with sidebending.  She had a very positive response to DN combined with ES to thoracic region last visit with complet relief.  Treatment focus today on addressing right neck and UE symptoms with DN/ES combo as well.  Improved soft tissue mobility and ROM noted following treatment.   ? Personal Factors and Comorbidities Age;Comorbidity 2;Time since onset of injury/illness/exacerbation;Comorbidity 1   ? Comorbidities DM; CKD; carpal tunnel   ? Examination-Activity Limitations Caring for Others;Carry;Lift;Bend;Squat   ? Rehab Potential Good   ? PT Frequency Biweekly   ? PT Duration 12 weeks   ? PT Treatment/Interventions ADLs/Self Care Home Management;Biofeedback;Cryotherapy;Electrical Stimulation;Moist Heat;Traction;Therapeutic activities;Therapeutic exercise;Neuromuscular re-education;Patient/family education;Manual techniques;Passive range of motion;Dry needling;Taping;Aquatic Therapy;Ultrasound   ? PT Next Visit Plan DN/ES combo to neck/thoracic; DN glutes, piriformis, cervical musculature as needed; U/S; ex; patient education; tapered visits; pt to call medical provider to sign recerts   ? ?  ?  ? ?  ? ? ?Patient will benefit from skilled  therapeutic intervention in order to improve the following deficits and impairments:  Pain, Increased fascial restricitons, Decreased strength, Decreased activity tolerance, Decreased range of motion, Increased muscl

## 2021-07-28 NOTE — Progress Notes (Incomplete)
?Cardiology Office Note:   ? ?Date:  07/28/2021  ? ?ID:  Krista Austin, DOB 04/25/1946, MRN [id number] ? ?PCP:  Doyle Askew, PA-C  ?Cardiologist:  None   ? ?Referring MD: Doyle Askew, PA-C  ? ?No chief complaint on file. ? ? ?History of Present Illness:   ? ?Krista Austin is a 76 y.o. female with a hx of Afib, hyperlipidemia, OSA on CPAP, diabetes mellitus type 2, and GERD here today for coronary atherosclerosis at the request of [name], PA-C.  ? ?She last saw [name], PA-C 02/21/21. Chest CT 11/2020 found mild aortic atherosclerosis without aneurysm and coronary artery calcification.  ? ?Today, *** ? ?Past Medical History:  ?Diagnosis Date  ? A-fib ([place])   ? Adenomatous polyp   ? Arthritis   ? "mostly fingers" (10/07/2016)  ? At high risk for falls   ? Celiac disease   ? 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 Chronic back pain   ? "worse in my neck; lower back also affected" (10/07/2016)  ? Chronic cholecystitis 10/16/2016  ? Complication of anesthesia   ? "never fully regained my taste after my cervical fusion; I don't take much RX so I'm very sensitive to any sedation" (10/07/2016)  ? Concussion   ? X2  ? DDD (degenerative disc disease), cervical   ? lumbar  ? Depression   ? "hx; take Prozac to keep me stable" (10/07/2016)  ? Dizziness   ? caused by compression of cervical disc  ? Family history of adverse reaction to anesthesia   ? "daughters get PONV; one daughter breaks out severely; grandson got suspended in a state of anxiousness after kidney surgery; had to be held for hours"  ? GERD (gastroesophageal reflux disease)   ? Hyperlipidemia   ? OSA on CPAP   ? Pneumonia   ? "once as an adult" (10/07/2016)  ? Thyroid nodule   ? Type II diabetes mellitus ([place])   ? controlled by diet and exercide  ? ? ?Past Surgical History:  ?Procedure Laterality Date  ? ANTERIOR CERVICAL DECOMP/DISCECTOMY FUSION  2015  ? BACK SURGERY    ? CARPAL TUNNEL RELEASE Bilateral   ?  CATARACT EXTRACTION, BILATERAL Bilateral 09/10/2014 - 09/24/2014  ? by Darleen Crocker  ? CHOLECYSTECTOMY N/A 10/16/2016  ? Procedure: LAPAROSCOPIC CHOLECYSTECTOMY;  Surgeon: [name], MD;  Location: [facility];  Service: General;  Laterality: N/A;  ? LAPAROSCOPIC CHOLECYSTECTOMY  10/16/2016  ? [place] SURGERY  2007  ? TONSILLECTOMY  05/05/1983  ? TUBAL LIGATION  02/09/1979  ? ? ?Current Medications: ?No outpatient medications have been marked as taking for the 07/30/21 encounter (Appointment) with Jerline Pain, MD.  ?  ? ?Allergies:   Lactose intolerance (gi)  ? ?Social History  ? ?Socioeconomic History  ? Marital status: Divorced  ?  Spouse name: Not on file  ? Number of children: Not on file  ? Years of education: Not on file  ? Highest education level: Not on file  ?Occupational History  ? Not on file  ?Tobacco Use  ? Smoking status: Former  ?  Packs/day: 1.00  ?  Years: 50.00  ?  Pack years: 50.00  ?  Types: Cigarettes  ?  Quit date: 10/16/2016  ?  Years since quitting: 4.7  ? Smokeless tobacco: Never  ? Tobacco comments:  ?  10/16/2016 "weaned down the past 9 days; on RX now; done!!!!!!!!"  ?Vaping Use  ?  Vaping Use: Never used  ?Substance and Sexual Activity  ? Alcohol use: No  ? Drug use: No  ? Sexual activity: Not Currently  ?Other Topics Concern  ? Not on file  ?Social History Narrative  ? Not on file  ? ?Social Determinants of Health  ? ?Financial Resource Strain: Not on file  ?Food Insecurity: Not on file  ?Transportation Needs: Not on file  ?Physical Activity: Not on file  ?Stress: Not on file  ?Social Connections: Not on file  ?  ? ?Family History: ?The patient's family history includes Arthritis in her maternal grandmother, mother, and sister; Atrial fibrillation in her father; Breast cancer in her maternal grandmother; COPD in her father; Dementia in her maternal grandmother; Diabetes type I in her maternal grandfather; Fibromyalgia in her sister; Hyperlipidemia in her mother and sister; Hypertension  in her maternal grandmother; Hypothyroidism in her mother, sister, and sister; Macular degeneration in her mother; Mitral valve prolapse in her mother; Osteoarthritis in her sister; Other in an other family member. ? ?ROS:   ?Please see the history of present illness.    ?All other systems reviewed and negative.  ? ?EKGs/Labs/Other Studies Reviewed:   ? ?The following studies were reviewed today: ?CT Chest 06/30/21 ?IMPRESSION: ?1. Lung-RADS 2, benign appearance or behavior. Continue annual screening with low-dose chest CT without contrast in 12 months. ?2. Dominant hypodense peripherally calcified 2.0 cm right thyroid nodule. Recommend thyroid US (ref: J Am Coll Radiol. 2015 Feb;12(2): 143-50). ?3. Three-vessel coronary atherosclerosis. ?4. Aortic Atherosclerosis (ICD10-I70.0) and Emphysema (ICD10-J43.9). ? ?CTA Chest 11/28/20 ?IMPRESSION: ?1. No pulmonary embolus. ?2. Mild heterogeneous pulmonary parenchyma in the lung bases typically seen with small airways disease. No confluent pneumonia. ?3. Mild emphysema. ?4. Aortic atherosclerosis and coronary artery calcifications. ?  ?Aortic Atherosclerosis (ICD10-I70.0) and Emphysema (ICD10-J43.9). ? ?LE Ultrasound DVT Study 10/07/16 ?Summary:  ?- No evidence of deep vein thrombosis involving the right lower extremity and left lower extremity.  ?- No evidence of Baker&'s cyst on the right or left. ? ?Echo 10/07/16 ?Study Conclusions  ?- Left ventricle: The cavity size was normal. Wall thickness was normal. Systolic function was normal. The estimated ejection fraction was in the range of 60% to 65%. Wall motion was normal; there were no regional wall motion abnormalities. Doppler parameters are consistent with abnormal left ventricular relaxation (grade 1 diastolic dysfunction).  ?- Aortic valve: There was no stenosis.  ?- Mitral valve: There was trivial regurgitation.  ?- Right ventricle: The cavity size was normal. Systolic function was normal.  ?- Pulmonary arteries: No  complete TR doppler jet so unable to estimate PA systolic pressure.  ?- Inferior vena cava: The vessel was normal in size. The  ?  respirophasic diameter changes were in the normal range (>= 50%), consistent with normal central venous pressure.  ? ?Impressions:  ?- Normal LV size with EF 60-65%. Normal RV size and systolic function. No significant valvular abnormalities.  ? ?Stress Test 10/07/16 ?IMPRESSION: ?1. No reversible ischemia or infarction. ?  ?2. Normal left ventricular wall motion. ?  ?3. Left ventricular ejection fraction 65% ?  ?4. Non invasive risk stratification*: Low ? ?EKG:  EKG was not ordered today ? ?Recent Labs: ?11/28/2020: ALT 33; BUN 19; Creatinine, Ser 1.03; Hemoglobin 14.0; Platelets 234; Potassium 3.8; Sodium 139  ? ?Recent Lipid Panel ?   ?Component Value Date/Time  ? CHOL 111 10/07/2016 0808  ? TRIG 113 10/07/2016 0808  ? HDL 33 (L) 10/07/2016 3646  ? CHOLHDL 3.4 10/07/2016  1610  ? VLDL 23 10/07/2016 0808  ? LDLCALC 55 10/07/2016 0808  ? ? ?CHA2DS2-VASc Score =   '[ ]'$ .  Therefore, the patient's annual risk of stroke is   %.    ?  ? ? ?Physical Exam:   ? ?VS:  There were no vitals taken for this visit.   ? ?Wt Readings from Last 3 Encounters:  ?11/28/20 155 lb (70.3 kg)  ?11/16/20 155 lb (70.3 kg)  ?06/16/19 166 lb (75.3 kg)  ?  ? ?GEN: Well nourished, well developed in no acute distress ?HEENT: Normal ?NECK: No JVD; No carotid bruits ?LYMPHATICS: No lymphadenopathy ?CARDIAC: RRR, no murmurs, rubs, gallops ?RESPIRATORY:  Clear to auscultation without rales, wheezing or rhonchi  ?ABDOMEN: Soft, non-tender, non-distended ?MUSCULOSKELETAL:  No edema; No deformity  ?SKIN: Warm and dry ?NEUROLOGIC:  Alert and oriented x 3 ?PSYCHIATRIC:  Normal affect  ? ?ASSESSMENT:   ? ?No diagnosis found. ?PLAN:   ? ?No problem-specific Assessment & Plan notes found for this encounter. ? ? ?  ? ?Medication Adjustments/Labs and Tests Ordered: ?Current medicines are reviewed at length with the patient today.   Concerns regarding medicines are outlined above.  ?No orders of the defined types were placed in this encounter. ? ?No orders of the defined types were placed in this encounter. ? ?I,Mykaella Javier,acting as a s

## 2021-07-30 ENCOUNTER — Ambulatory Visit: Payer: Medicare Other | Admitting: Cardiology

## 2021-08-05 ENCOUNTER — Ambulatory Visit: Payer: Medicare Other | Attending: Neurosurgery | Admitting: Physical Therapy

## 2021-08-05 DIAGNOSIS — M5136 Other intervertebral disc degeneration, lumbar region: Secondary | ICD-10-CM | POA: Diagnosis present

## 2021-08-05 DIAGNOSIS — M542 Cervicalgia: Secondary | ICD-10-CM | POA: Diagnosis present

## 2021-08-05 DIAGNOSIS — M6281 Muscle weakness (generalized): Secondary | ICD-10-CM | POA: Diagnosis present

## 2021-08-05 NOTE — Therapy (Signed)
[facility] ?[place], [place], [address] ?Phone: [phone]   Fax:  [phone] ? ?Physical Therapy Treatment/Recertification ? ?Patient Details  ?Name: Krista Austin ?MRN: [id number] ?Date of Birth: 08-31-1945 ?Referring Provider (PT): [name] PA ? ? ?Encounter Date: 08/05/2021 ? ? PT End of Session - 08/05/21 0935   ? ? Visit Number 15   ? Date for PT Re-Evaluation 10/28/21   ? Authorization Type medicare A/B 20th visit prog note   ? Progress Note Due on Visit 20   ? PT Start Time 6144   ? PT Stop Time 1010   ? PT Time Calculation (min) 38 min   ? Activity Tolerance Patient tolerated treatment well   ? ?  ?  ? ?  ? ? ?Past Medical History:  ?Diagnosis Date  ? A-fib ([place])   ? Adenomatous polyp   ? Arthritis   ? "mostly fingers" (10/07/2016)  ? At high risk for falls   ? Celiac disease   ? 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 Chronic back pain   ? "worse in my neck; lower back also affected" (10/07/2016)  ? Chronic cholecystitis 10/16/2016  ? Complication of anesthesia   ? "never fully regained my taste after my cervical fusion; I don't take much RX so I'm very sensitive to any sedation" (10/07/2016)  ? Concussion   ? X2  ? DDD (degenerative disc disease), cervical   ? lumbar  ? Depression   ? "hx; take Prozac to keep me stable" (10/07/2016)  ? Dizziness   ? caused by compression of cervical disc  ? Family history of adverse reaction to anesthesia   ? "daughters get PONV; one daughter breaks out severely; grandson got suspended in a state of anxiousness after kidney surgery; had to be held for hours"  ? GERD (gastroesophageal reflux disease)   ? Hyperlipidemia   ? OSA on CPAP   ? Pneumonia   ? "once as an adult" (10/07/2016)  ? Thyroid nodule   ? Type II diabetes mellitus ([place])   ? controlled by diet and exercide  ? ? ?Past Surgical History:  ?Procedure Laterality Date  ? ANTERIOR CERVICAL DECOMP/DISCECTOMY FUSION   2015  ? BACK SURGERY    ? CARPAL TUNNEL RELEASE Bilateral   ? CATARACT EXTRACTION, BILATERAL Bilateral 09/10/2014 - 09/24/2014  ? by Darleen Crocker  ? CHOLECYSTECTOMY N/A 10/16/2016  ? Procedure: LAPAROSCOPIC CHOLECYSTECTOMY;  Surgeon: [name], MD;  Location: [facility];  Service: General;  Laterality: N/A;  ? LAPAROSCOPIC CHOLECYSTECTOMY  10/16/2016  ? [place] SURGERY  2007  ? TONSILLECTOMY  05/05/1983  ? TUBAL LIGATION  02/09/1979  ? ? ?There were no vitals filed for this visit. ? ? Subjective Assessment - 08/05/21 0935   ? ? Subjective My neck is the biggest issue, arms and hands bother me.  I'd like to focus on that.  I have a lot of vertigo and I think that's related to my neck.   ? Pertinent History DM, stage III kidney; osteoporosis (mild)   ? Diagnostic tests MRI 02/04/21 C5-6 solid arthrodesis and no impingement; mild to moderat narrowing left C3-4 and right C 4-5, thoracic multilevel degenerative changes   ? Patient Stated Goals do 2-3 hours of housework   ? Currently in Pain? Yes   ? Pain Score 3    ? Pain Location Neck   ? Pain Type Chronic pain   ? ?  ?  ? ?  ? ? ? ? ?  [facility] PT Assessment - 08/05/21 0001   ? ?  ? Observation/Other Assessments  ? Neck Disability Index  36%   ?  ? AROM  ? Cervical Flexion 40   ? Cervical Extension 50   ? Cervical - Right Side Bend 25   ? Cervical - Left Side Bend 22   ? Cervical - Right Rotation 40   ? Cervical - Left Rotation 30   ? Lumbar Flexion 70   ? Lumbar Extension 12   ? Lumbar - Right Side Bend 30   ? Lumbar - Left Side Bend 25   ?  ? Strength  ? Overall Strength Comments right grip 20#; left 15#   ? Left Shoulder ABduction 4/5   ? Left Shoulder External Rotation 4/5   ? ?  ?  ? ?  ? ? ? ? ? ? ? ? ? ? ? ? ? ? ? ? [facility] Adult PT Treatment/Exercise - 08/05/21 0001   ? ?  ? Electrical Stimulation  ? Electrical Stimulation Location bil cervical multifidi in L4-/S1 and gluteal region   ? Electrical Stimulation Action 2.5   ? Electrical Stimulation Parameters 15 min   ?  Electrical Stimulation Goals Pain   ?  ? Manual Therapy  ? Soft tissue mobilization bil cervical multifidi, bil upper traps, cervical paraspinals, suboccipitals   ? ?  ?  ? ?  ? ? ? Trigger Point Dry Needling - 08/05/21 0001   ? ? Electrical Stimulation Performed with Dry Needling Yes   ? E-stim with Dry Needling Details right cervical multifidi   ? Suboccipitals Response Twitch response elicited;Palpable increased muscle length   ? Cervical multifidi Response Twitch reponse elicited;Palpable increased muscle length   ? ?  ?  ? ?  ? ? ? ? ? ? ? ? ? ? PT Short Term Goals - 08/05/21 1004   ? ?  ? PT SHORT TERM GOAL #1  ? Title Pt will be independent with her initial HEP to decrease pain/modulate symptoms   ? Status Achieved   ?  ? PT SHORT TERM GOAL #2  ? Title Pt will report at least 30% decrease in neck pain and UE pain/numbness.   ? Status Achieved   ?  ? PT SHORT TERM GOAL #3  ? Title Nexk Disability Index improved to 38%   ? Status Achieved   ?  ? PT SHORT TERM GOAL #4  ? Title Cervical flexion improved to 40 degrees and extension to 50 degrees needed for grooming/dressing tasks   ? Time 12   ? Period Weeks   ? Status Partially Met   ? ?  ?  ? ?  ? ? ? ? PT Long Term Goals - 08/05/21 1001   ? ?  ? PT LONG TERM GOAL #1  ? Title The patient will be independent in self management techniques and strategies needed to take care of herself and her son   ? Time 12   ? Period Weeks   ? Status On-going   ? Target Date 10/28/21   ?  ? PT LONG TERM GOAL #2  ? Title Pt will report atleast 65% improvement in neck and UE paresthesia with daily activity or typically 3/10 with ADLs like writing, peeling fruits/vegetables and opening jars   ? Time 12   ? Period Weeks   ? Status Revised   ?  ? PT LONG TERM GOAL #3  ? Title Improved cervical sidebending ROM  improved to 40 degrees and rotation to 45 degrees needed for driving   ? Time 12   ? Period Weeks   ? Status On-going   ?  ? PT LONG TERM GOAL #4  ? Title Neck Disability Index  improved to 32% indicating improved function with less pain   ? Time 12   ? Period Weeks   ? Status On-going   ?  ? PT LONG TERM GOAL #5  ? Title Cervical and periscapular strength improved to 4+/5 needed for lifting/ carrying bags   ? Status Achieved   ?  ? PT LONG TERM GOAL #6  ? Title LBP and right peripheral symptoms improved by 65% or typically 3/10 with ADLs   ? Time 12   ? Period Weeks   ? Status On-going   ?  ? PT LONG TERM GOAL #7  ? Title Improve grip strength to 22# on right, left 18# needed for peeling fruits/vegetables as the cook of the household   ? Time 12   ? Period Weeks   ? Status Achieved   ? ?  ?  ? ?  ? ? ? ? ? ? ? ? Plan - 08/05/21 1652   ? ? Clinical Impression Statement The patient reports manageable lower and mid back pain.   Her primary complaint today is upper and lower neck pain as well as bil UE symptoms.  Her cervical ROM has improved since start of care however she has decreased left grip strength.  She would benefit from continued skilled PT to manage this multi regional body pain.  Without PT she would likely have a decline in functional status.  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  She has a positive response to manual therapy,  dry needling combined with ES.   ? Personal Factors and Comorbidities Age;Comorbidity 2;Time since onset of injury/illness/exacerbation;Comorbidity 1   ? Comorbidities DM; CKD; carpal tunnel   ? Examination-Activity Limitations Caring for Others;Carry;Lift;Bend;Squat   ? Stability/Clinical Decision Making Evolving/Moderate complexity   ? Rehab Potential Good   ? PT Frequency 1x / week   ? PT Duration 12 weeks   ? PT Treatment/Interventions ADLs/Self Care Home Management;Biofeedback;Cryotherapy;Electrical Stimulation;Moist Heat;Traction;Therapeutic activities;Therapeutic exercise;Neuromuscular re-education;Patient/family education;Manual techniques;Passive range of motion;Dry needling;Taping;Aquatic Therapy;Ultrasound   ? PT Next  Visit Plan DN/ES combo to neck/thoracic; DN glutes, piriformis, cervical musculature as needed; U/S; ex; patient education   ? PT Home Exercise Plan Access Code: [email or web address]   ? ?  ?  ? ?  ? ? ?Patient will

## 2021-08-14 ENCOUNTER — Ambulatory Visit: Payer: Medicare Other | Admitting: Physical Therapy

## 2021-08-14 DIAGNOSIS — M5136 Other intervertebral disc degeneration, lumbar region: Secondary | ICD-10-CM | POA: Diagnosis not present

## 2021-08-14 DIAGNOSIS — M6281 Muscle weakness (generalized): Secondary | ICD-10-CM

## 2021-08-14 DIAGNOSIS — M542 Cervicalgia: Secondary | ICD-10-CM

## 2021-08-14 NOTE — Therapy (Signed)
[facility] ?[place], [place], [address] ?Phone: [phone]   Fax:  [phone] ? ?Physical Therapy Treatment ? ?Patient Details  ?Name: Krista Austin ?MRN: [id number] ?Date of Birth: 08-13-1945 ?Referring Provider (PT): [name] PA ? ? ?Encounter Date: 08/14/2021 ? ? PT End of Session - 08/14/21 1047   ? ? Visit Number 16   ? Date for PT Re-Evaluation 10/28/21   ? Authorization Type medicare A/B 20th visit prog note   ? Progress Note Due on Visit 20   ? PT Start Time 1012   ? PT Stop Time 0240   DN  ? PT Time Calculation (min) 30 min   ? ?  ?  ? ?  ? ? ?Past Medical History:  ?Diagnosis Date  ? A-fib ([place])   ? Adenomatous polyp   ? Arthritis   ? "mostly fingers" (10/07/2016)  ? At high risk for falls   ? Celiac disease   ? 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 Chronic back pain   ? "worse in my neck; lower back also affected" (10/07/2016)  ? Chronic cholecystitis 10/16/2016  ? Complication of anesthesia   ? "never fully regained my taste after my cervical fusion; I don't take much RX so I'm very sensitive to any sedation" (10/07/2016)  ? Concussion   ? X2  ? DDD (degenerative disc disease), cervical   ? lumbar  ? Depression   ? "hx; take Prozac to keep me stable" (10/07/2016)  ? Dizziness   ? caused by compression of cervical disc  ? Family history of adverse reaction to anesthesia   ? "daughters get PONV; one daughter breaks out severely; grandson got suspended in a state of anxiousness after kidney surgery; had to be held for hours"  ? GERD (gastroesophageal reflux disease)   ? Hyperlipidemia   ? OSA on CPAP   ? Pneumonia   ? "once as an adult" (10/07/2016)  ? Thyroid nodule   ? Type II diabetes mellitus ([place])   ? controlled by diet and exercide  ? ? ?Past Surgical History:  ?Procedure Laterality Date  ? ANTERIOR CERVICAL DECOMP/DISCECTOMY FUSION  2015  ? BACK SURGERY    ? CARPAL TUNNEL RELEASE Bilateral   ?  CATARACT EXTRACTION, BILATERAL Bilateral 09/10/2014 - 09/24/2014  ? by Darleen Crocker  ? CHOLECYSTECTOMY N/A 10/16/2016  ? Procedure: LAPAROSCOPIC CHOLECYSTECTOMY;  Surgeon: [name], MD;  Location: [facility];  Service: General;  Laterality: N/A;  ? LAPAROSCOPIC CHOLECYSTECTOMY  10/16/2016  ? [place] SURGERY  2007  ? TONSILLECTOMY  05/05/1983  ? TUBAL LIGATION  02/09/1979  ? ? ?There were no vitals filed for this visit. ? ? Subjective Assessment - 08/14/21 1048   ? ? Subjective My biggest problem today is my neck.  Hands are still tingling and getting weaker unfortunately.   ? Pertinent History DM, stage III kidney; osteoporosis (mild)   ? Diagnostic tests MRI 02/04/21 C5-6 solid arthrodesis and no impingement; mild to moderat narrowing left C3-4 and right C 4-5, thoracic multilevel degenerative changes   ? Patient Stated Goals do 2-3 hours of housework   ? Currently in Pain? Yes   ? Pain Score 3    ? Pain Location Neck   ? Pain Orientation Right;Left   ? ?  ?  ? ?  ? ? ? ? ? ? ? ? ? ? ? ? ? ? ? ? ? ? ? ?  [place] Adult PT Treatment/Exercise - 08/14/21 0001   ? ?  ? Electrical Stimulation  ? Electrical Stimulation Location bil cervical multifidi   ? Electrical Stimulation Action 2.5 ma   ? Electrical Stimulation Parameters 8   ? Electrical Stimulation Goals Pain   ?  ? Manual Therapy  ? Soft tissue mobilization bil cervical multifidi, bil upper traps, cervical paraspinals, suboccipitals   ? ?  ?  ? ?  ? ? ? Trigger Point Dry Needling - 08/14/21 0001   ? ? Electrical Stimulation Performed with Dry Needling Yes   ? E-stim with Dry Needling Details bil cervical multifidi   ? Suboccipitals Response Twitch response elicited;Palpable increased muscle length   ? Cervical multifidi Response Twitch reponse elicited;Palpable increased muscle length   ? ?  ?  ? ?  ? ? ? ? ? ? ? ? ? ? PT Short Term Goals - 08/05/21 1004   ? ?  ? PT SHORT TERM GOAL #1  ? Title Pt will be independent with her initial HEP to decrease pain/modulate  symptoms   ? Status Achieved   ?  ? PT SHORT TERM GOAL #2  ? Title Pt will report at least 30% decrease in neck pain and UE pain/numbness.   ? Status Achieved   ?  ? PT SHORT TERM GOAL #3  ? Title Nexk Disability Index improved to 38%   ? Status Achieved   ?  ? PT SHORT TERM GOAL #4  ? Title Cervical flexion improved to 40 degrees and extension to 50 degrees needed for grooming/dressing tasks   ? Time 12   ? Period Weeks   ? Status Partially Met   ? ?  ?  ? ?  ? ? ? ? PT Long Term Goals - 08/05/21 1001   ? ?  ? PT LONG TERM GOAL #1  ? Title The patient will be independent in self management techniques and strategies needed to take care of herself and her son   ? Time 12   ? Period Weeks   ? Status On-going   ? Target Date 10/28/21   ?  ? PT LONG TERM GOAL #2  ? Title Pt will report atleast 65% improvement in neck and UE paresthesia with daily activity or typically 3/10 with ADLs like writing, peeling fruits/vegetables and opening jars   ? Time 12   ? Period Weeks   ? Status Revised   ?  ? PT LONG TERM GOAL #3  ? Title Improved cervical sidebending ROM improved to 40 degrees and rotation to 45 degrees needed for driving   ? Time 12   ? Period Weeks   ? Status On-going   ?  ? PT LONG TERM GOAL #4  ? Title Neck Disability Index improved to 32% indicating improved function with less pain   ? Time 12   ? Period Weeks   ? Status On-going   ?  ? PT LONG TERM GOAL #5  ? Title Cervical and periscapular strength improved to 4+/5 needed for lifting/ carrying bags   ? Status Achieved   ?  ? PT LONG TERM GOAL #6  ? Title LBP and right peripheral symptoms improved by 65% or typically 3/10 with ADLs   ? Time 12   ? Period Weeks   ? Status On-going   ?  ? PT LONG TERM GOAL #7  ? Title Improve grip strength to 22# on right, left 18# needed for peeling fruits/vegetables  as the cook of the household   ? Time 12   ? Period Weeks   ? Status Achieved   ? ?  ?  ? ?  ? ? ? ? ? ? ? ? Plan - 08/14/21 1051   ? ? Clinical Impression Statement  Treatment focus on cervical region to address myofascial tender points and spasm particularly in upper trap and suboccipital muscles.  She has difficulty tolerating prone position today secondary to hand symptoms (try narrow treatment table next time).  Following treatment session, improved soft tissue mobility noted and a reduction in neck symptoms.  No change in peripheral symptoms.   ? Personal Factors and Comorbidities Age;Comorbidity 2;Time since onset of injury/illness/exacerbation;Comorbidity 1   ? Comorbidities DM; CKD; carpal tunnel   ? Examination-Activity Limitations Caring for Others;Carry;Lift;Bend;Squat   ? PT Frequency 1x / week   ? PT Duration 12 weeks   ? PT Treatment/Interventions ADLs/Self Care Home Management;Biofeedback;Cryotherapy;Electrical Stimulation;Moist Heat;Traction;Therapeutic activities;Therapeutic exercise;Neuromuscular re-education;Patient/family education;Manual techniques;Passive range of motion;Dry needling;Taping;Aquatic Therapy;Ultrasound   ? PT Next Visit Plan DN/ES combo to neck/thoracic; DN glutes, piriformis, cervical musculature as needed; U/S; ex; patient education   ? PT Home Exercise Plan Access Code: [email or web address]   ? ?  ?  ? ?  ? ? ?Patient will benefit from skilled therapeutic intervention in order to improve the following deficits and impairments:  Pain, Increased fascial restricitons, Decreased strength, Decreased activity tolerance, Decreased range of motion, Increased muscle spasms, Decreased endurance, Impaired UE functional use ? ?Visit Diagnosis: ?Cervicalgia ? ?Muscle weakness (generalized) ? ? ? ? ?Problem List ?Patient Active Problem List  ? Diagnosis Date Noted  ? Closed fracture of 9th & 10th ribs of right side 12/17/2016  ? Pneumothorax on right 12/17/2016  ? Lumbar degenerative disc disease 12/17/2016  ? Essential hypertension 12/17/2016  ? Dyslipidemia 10/07/2016  ? Pulmonary nodule 10/07/2016  ? Abnormal CXR   ? Chest pain 10/06/2016  ? S/P lumbar spinal  fusion 08/05/2016  ? Obstructive sleep apnea syndrome, mild 10/27/2013  ? Controlled type 2 diabetes mellitus without complication, without long-term current use of insulin ([place]) 06/19/2011  ? GAD (generalized

## 2021-08-26 ENCOUNTER — Ambulatory Visit: Payer: Medicare Other | Attending: Physician Assistant | Admitting: Physical Therapy

## 2021-08-26 DIAGNOSIS — M542 Cervicalgia: Secondary | ICD-10-CM | POA: Diagnosis present

## 2021-08-26 DIAGNOSIS — M6281 Muscle weakness (generalized): Secondary | ICD-10-CM

## 2021-08-26 NOTE — Therapy (Signed)
[facility] ?[place], [place], [address] ?Phone: [phone]   Fax:  [phone] ? ?Physical Therapy Treatment ? ?Patient Details  ?Name: Krista Austin ?MRN: [id number] ?Date of Birth: May 09, 1945 ?Referring Provider (PT): [name] PA ? ? ?Encounter Date: 08/26/2021 ? ? PT End of Session - 08/26/21 1133   ? ? Visit Number 17   ? Date for PT Re-Evaluation 10/28/21   ? Authorization Type medicare A/B 20th visit prog note   ? Progress Note Due on Visit 20   ? PT Start Time 1100   ? PT Stop Time 1140   ? PT Time Calculation (min) 40 min   ? Activity Tolerance Patient tolerated treatment well   ? ?  ?  ? ?  ? ? ?Past Medical History:  ?Diagnosis Date  ? A-fib ([place])   ? Adenomatous polyp   ? Arthritis   ? "mostly fingers" (10/07/2016)  ? At high risk for falls   ? Celiac disease   ? 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 Chronic back pain   ? "worse in my neck; lower back also affected" (10/07/2016)  ? Chronic cholecystitis 10/16/2016  ? Complication of anesthesia   ? "never fully regained my taste after my cervical fusion; I don't take much RX so I'm very sensitive to any sedation" (10/07/2016)  ? Concussion   ? X2  ? DDD (degenerative disc disease), cervical   ? lumbar  ? Depression   ? "hx; take Prozac to keep me stable" (10/07/2016)  ? Dizziness   ? caused by compression of cervical disc  ? Family history of adverse reaction to anesthesia   ? "daughters get PONV; one daughter breaks out severely; grandson got suspended in a state of anxiousness after kidney surgery; had to be held for hours"  ? GERD (gastroesophageal reflux disease)   ? Hyperlipidemia   ? OSA on CPAP   ? Pneumonia   ? "once as an adult" (10/07/2016)  ? Thyroid nodule   ? Type II diabetes mellitus ([place])   ? controlled by diet and exercide  ? ? ?Past Surgical History:  ?Procedure Laterality Date  ? ANTERIOR CERVICAL DECOMP/DISCECTOMY FUSION  2015  ? BACK  SURGERY    ? CARPAL TUNNEL RELEASE Bilateral   ? CATARACT EXTRACTION, BILATERAL Bilateral 09/10/2014 - 09/24/2014  ? by Darleen Crocker  ? CHOLECYSTECTOMY N/A 10/16/2016  ? Procedure: LAPAROSCOPIC CHOLECYSTECTOMY;  Surgeon: [name], MD;  Location: [facility];  Service: General;  Laterality: N/A;  ? LAPAROSCOPIC CHOLECYSTECTOMY  10/16/2016  ? [place] SURGERY  2007  ? TONSILLECTOMY  05/05/1983  ? TUBAL LIGATION  02/09/1979  ? ? ?There were no vitals filed for this visit. ? ? Subjective Assessment - 08/26/21 1102   ? ? Subjective My neck, left arm and thumb are really bothering me.  My vertigo is back.   ? Pertinent History DM, stage III kidney; osteoporosis (mild)   ? Diagnostic tests MRI 02/04/21 C5-6 solid arthrodesis and no impingement; mild to moderat narrowing left C3-4 and right C 4-5, thoracic multilevel degenerative changes   ? Patient Stated Goals do 2-3 hours of housework   ? Currently in Pain? Yes   ? Pain Score 5    ? Pain Location Neck   ? Pain Orientation Right;Left   ? Pain Type Chronic pain   ? ?  ?  ? ?  ? ? ? ? ? ? ? ? ? ? ? ? ? ? ? ? ? ? ? ?  [place] Adult PT Treatment/Exercise - 08/26/21 0001   ? ?  ? Moist Heat Therapy  ? Moist Heat Location Cervical   ?  ? Electrical Stimulation  ? Electrical Stimulation Location bil cervical multifidi in L4-/S1 and gluteal region   ? Electrical Stimulation Action 3.0 ma   ? Electrical Stimulation Parameters 10   ? Electrical Stimulation Goals Pain   ?  ? Manual Therapy  ? Soft tissue mobilization bil cervical multifidi, bil upper traps, cervical paraspinals, suboccipitals; left wrist extensors, thenar eminence   ? ?  ?  ? ?  ? ? ? Trigger Point Dry Needling - 08/26/21 0001   ? ? Muscles Treated Wrist/Hand Extensor carpi radialis longus/brevis;Opponens pollicis;Flexor pollicis brevis;Extensor digitorum;Extensor carpi ulnaris   ? Electrical Stimulation Performed with Dry Needling Yes   ? E-stim with Dry Needling Details bil cervical multifidi   ? Suboccipitals  Response Twitch response elicited;Palpable increased muscle length   ? Cervical multifidi Response Twitch reponse elicited;Palpable increased muscle length   ? Extensor carpi radialis longus/brevis Response Palpable increased muscle length   ? Extensor digitorum Response Palpable increased muscle length   ? Extensor carpi ulnaris Response Palpable increased muscle length   ? Opponens pollicis Response Palpable increased muscle length   ? Flexor pollicis brevis Response Palpable increased muscle length   ? ?  ?  ? ?  ? ? ? ? ? ? ? ? ? ? PT Short Term Goals - 08/05/21 1004   ? ?  ? PT SHORT TERM GOAL #1  ? Title Pt will be independent with her initial HEP to decrease pain/modulate symptoms   ? Status Achieved   ?  ? PT SHORT TERM GOAL #2  ? Title Pt will report at least 30% decrease in neck pain and UE pain/numbness.   ? Status Achieved   ?  ? PT SHORT TERM GOAL #3  ? Title Nexk Disability Index improved to 38%   ? Status Achieved   ?  ? PT SHORT TERM GOAL #4  ? Title Cervical flexion improved to 40 degrees and extension to 50 degrees needed for grooming/dressing tasks   ? Time 12   ? Period Weeks   ? Status Partially Met   ? ?  ?  ? ?  ? ? ? ? PT Long Term Goals - 08/05/21 1001   ? ?  ? PT LONG TERM GOAL #1  ? Title The patient will be independent in self management techniques and strategies needed to take care of herself and her son   ? Time 12   ? Period Weeks   ? Status On-going   ? Target Date 10/28/21   ?  ? PT LONG TERM GOAL #2  ? Title Pt will report atleast 65% improvement in neck and UE paresthesia with daily activity or typically 3/10 with ADLs like writing, peeling fruits/vegetables and opening jars   ? Time 12   ? Period Weeks   ? Status Revised   ?  ? PT LONG TERM GOAL #3  ? Title Improved cervical sidebending ROM improved to 40 degrees and rotation to 45 degrees needed for driving   ? Time 12   ? Period Weeks   ? Status On-going   ?  ? PT LONG TERM GOAL #4  ? Title Neck Disability Index improved to 32%  indicating improved function with less pain   ? Time 12   ? Period Weeks   ? Status On-going   ?  ?  PT LONG TERM GOAL #5  ? Title Cervical and periscapular strength improved to 4+/5 needed for lifting/ carrying bags   ? Status Achieved   ?  ? PT LONG TERM GOAL #6  ? Title LBP and right peripheral symptoms improved by 65% or typically 3/10 with ADLs   ? Time 12   ? Period Weeks   ? Status On-going   ?  ? PT LONG TERM GOAL #7  ? Title Improve grip strength to 22# on right, left 18# needed for peeling fruits/vegetables as the cook of the household   ? Time 12   ? Period Weeks   ? Status Achieved   ? ?  ?  ? ?  ? ? ? ? ? ? ? ? Plan - 08/26/21 1704   ? ? Clinical Impression Statement The patient reports an increase in peripheral UE symptoms on left (dominant side).  Added manual therapy and DN to wrist extensors and thenar eminence with decreased tender points noted.  Positive response to DN combined with ES to cervical region for optimal pain relief.  Therapist monitoring response with all interventions.   ? Personal Factors and Comorbidities Age;Comorbidity 2;Time since onset of injury/illness/exacerbation;Comorbidity 1   ? Examination-Activity Limitations Caring for Others;Carry;Lift;Bend;Squat   ? Rehab Potential Good   ? PT Frequency 1x / week   ? PT Duration 12 weeks   ? PT Treatment/Interventions ADLs/Self Care Home Management;Biofeedback;Cryotherapy;Electrical Stimulation;Moist Heat;Traction;Therapeutic activities;Therapeutic exercise;Neuromuscular re-education;Patient/family education;Manual techniques;Passive range of motion;Dry needling;Taping;Aquatic Therapy;Ultrasound   ? PT Next Visit Plan assess response to left UE DN;  DN/ES combo to neck/thoracic; DN glutes, piriformis, cervical musculature as needed; U/S; ex; patient education   ? PT Home Exercise Plan Access Code: [email or web address]   ? ?  ?  ? ?  ? ? ?Patient will benefit from skilled therapeutic intervention in order to improve the following deficits and  impairments:  Pain, Increased fascial restricitons, Decreased strength, Decreased activity tolerance, Decreased range of motion, Increased muscle spasms, Decreased endurance, Impaired UE functional use ? ?Visit Dia

## 2021-09-11 ENCOUNTER — Ambulatory Visit: Payer: Medicare Other | Attending: Physician Assistant | Admitting: Physical Therapy

## 2021-09-11 DIAGNOSIS — M6281 Muscle weakness (generalized): Secondary | ICD-10-CM | POA: Diagnosis present

## 2021-09-11 DIAGNOSIS — M542 Cervicalgia: Secondary | ICD-10-CM | POA: Diagnosis present

## 2021-09-11 DIAGNOSIS — M5136 Other intervertebral disc degeneration, lumbar region: Secondary | ICD-10-CM | POA: Diagnosis present

## 2021-09-11 NOTE — Therapy (Signed)
?[facility] ?[place], [place], [address] ?Phone: [phone]   Fax:  [phone] ? ?Physical Therapy Treatment ? ?Patient Details  ?Name: Krista Austin ?MRN: [id number] ?Date of Birth: 25-Mar-1946 ?Referring Provider (PT): [name] PA ? ? ?Encounter Date: 09/11/2021 ? ? PT End of Session - 09/11/21 0925   ? ? Visit Number 18   ? Date for PT Re-Evaluation 10/28/21   ? Authorization Type medicare A/B 20th visit prog note   ? Progress Note Due on Visit 20   ? PT Start Time 1008   ? PT Stop Time 1497   ? PT Time Calculation (min) 41 min   ? Activity Tolerance Patient tolerated treatment well   ? ?  ?  ? ?  ? ? ?Past Medical History:  ?Diagnosis Date  ? A-fib ([place])   ? Adenomatous polyp   ? Arthritis   ? "mostly fingers" (10/07/2016)  ? At high risk for falls   ? Celiac disease   ? 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 Chronic back pain   ? "worse in my neck; lower back also affected" (10/07/2016)  ? Chronic cholecystitis 10/16/2016  ? Complication of anesthesia   ? "never fully regained my taste after my cervical fusion; I don't take much RX so I'm very sensitive to any sedation" (10/07/2016)  ? Concussion   ? X2  ? DDD (degenerative disc disease), cervical   ? lumbar  ? Depression   ? "hx; take Prozac to keep me stable" (10/07/2016)  ? Dizziness   ? caused by compression of cervical disc  ? Family history of adverse reaction to anesthesia   ? "daughters get PONV; one daughter breaks out severely; grandson got suspended in a state of anxiousness after kidney surgery; had to be held for hours"  ? GERD (gastroesophageal reflux disease)   ? Hyperlipidemia   ? OSA on CPAP   ? Pneumonia   ? "once as an adult" (10/07/2016)  ? Thyroid nodule   ? Type II diabetes mellitus ([place])   ? controlled by diet and exercide  ? ? ?Past Surgical History:  ?Procedure Laterality Date  ? ANTERIOR CERVICAL DECOMP/DISCECTOMY FUSION  2015  ? BACK  SURGERY    ? CARPAL TUNNEL RELEASE Bilateral   ? CATARACT EXTRACTION, BILATERAL Bilateral 09/10/2014 - 09/24/2014  ? by Darleen Crocker  ? CHOLECYSTECTOMY N/A 10/16/2016  ? Procedure: LAPAROSCOPIC CHOLECYSTECTOMY;  Surgeon: [name], MD;  Location: [facility];  Service: General;  Laterality: N/A;  ? LAPAROSCOPIC CHOLECYSTECTOMY  10/16/2016  ? [place] SURGERY  2007  ? TONSILLECTOMY  05/05/1983  ? TUBAL LIGATION  02/09/1979  ? ? ?There were no vitals filed for this visit. ? ? Subjective Assessment - 09/11/21 0931   ? ? Subjective I have had a terrible 2 weeks with my neck, mid back and vertigo.  Had an MRI last night and waiting on the results.   ? Pertinent History DM, stage III kidney; osteoporosis (mild)   ? Diagnostic tests MRI 02/04/21 C5-6 solid arthrodesis and no impingement; mild to moderat narrowing left C3-4 and right C 4-5, thoracic multilevel degenerative changes   ? Patient Stated Goals do 2-3 hours of housework   ? Currently in Pain? Yes   ? Pain Score 7    ? Pain Location Neck   ? ?  ?  ? ?  ? ? ? ? ? ? ? ? ? ? ? ? ? ? ? ? ? ? ? ?  [facility] Adult PT Treatment/Exercise - 09/11/21 0001   ? ?  ? Self-Care  ? Other Self-Care Comments  discussion of home TENS electrode placement optimal for pain control   ?  ? Moist Heat Therapy  ? Number Minutes Moist Heat --   concurrent with treatment in other areas  ? Moist Heat Location --   thoracic  ?  ? Electrical Stimulation  ? Electrical Stimulation Location bil cervical multifidi in L4-/S1 and gluteal region   ? Electrical Stimulation Action 3.5 ma   ? Electrical Stimulation Parameters 10   ? Electrical Stimulation Goals Pain   ?  ? Manual Therapy  ? Joint Mobilization mid and lower thoracic grade 2/3 PA mobs 3x 20 sec each level   ? Soft tissue mobilization bil cervical multifidi, bil upper traps, cervical paraspinals, suboccipitals; thoracic paraspinals   ? ?  ?  ? ?  ? ? ? Trigger Point Dry Needling - 09/11/21 0001   ? ? Electrical Stimulation Performed with Dry  Needling Yes   ? E-stim with Dry Needling Details bil cervical multifidi   ? Upper Trapezius Response Twitch reponse elicited;Palpable increased muscle length   ? Suboccipitals Response Twitch response elicited;Palpable increased muscle length   ? Cervical multifidi Response Twitch reponse elicited;Palpable increased muscle length   thoracic paraspinals mid  ? ?  ?  ? ?  ? ? ? ? ? ? ? ? ? ? PT Short Term Goals - 08/05/21 1004   ? ?  ? PT SHORT TERM GOAL #1  ? Title Pt will be independent with her initial HEP to decrease pain/modulate symptoms   ? Status Achieved   ?  ? PT SHORT TERM GOAL #2  ? Title Pt will report at least 30% decrease in neck pain and UE pain/numbness.   ? Status Achieved   ?  ? PT SHORT TERM GOAL #3  ? Title Nexk Disability Index improved to 38%   ? Status Achieved   ?  ? PT SHORT TERM GOAL #4  ? Title Cervical flexion improved to 40 degrees and extension to 50 degrees needed for grooming/dressing tasks   ? Time 12   ? Period Weeks   ? Status Partially Met   ? ?  ?  ? ?  ? ? ? ? PT Long Term Goals - 08/05/21 1001   ? ?  ? PT LONG TERM GOAL #1  ? Title The patient will be independent in self management techniques and strategies needed to take care of herself and her son   ? Time 12   ? Period Weeks   ? Status On-going   ? Target Date 10/28/21   ?  ? PT LONG TERM GOAL #2  ? Title Pt will report atleast 65% improvement in neck and UE paresthesia with daily activity or typically 3/10 with ADLs like writing, peeling fruits/vegetables and opening jars   ? Time 12   ? Period Weeks   ? Status Revised   ?  ? PT LONG TERM GOAL #3  ? Title Improved cervical sidebending ROM improved to 40 degrees and rotation to 45 degrees needed for driving   ? Time 12   ? Period Weeks   ? Status On-going   ?  ? PT LONG TERM GOAL #4  ? Title Neck Disability Index improved to 32% indicating improved function with less pain   ? Time 12   ? Period Weeks   ? Status On-going   ?  ?  PT LONG TERM GOAL #5  ? Title Cervical and  periscapular strength improved to 4+/5 needed for lifting/ carrying bags   ? Status Achieved   ?  ? PT LONG TERM GOAL #6  ? Title LBP and right peripheral symptoms improved by 65% or typically 3/10 with ADLs   ? Time 12   ? Period Weeks   ? Status On-going   ?  ? PT LONG TERM GOAL #7  ? Title Improve grip strength to 22# on right, left 18# needed for peeling fruits/vegetables as the cook of the household   ? Time 12   ? Period Weeks   ? Status Achieved   ? ?  ?  ? ?  ? ? ? ? ? ? ? ? Plan - 09/11/21 1059   ? ? Clinical Impression Statement The patient has a very positive response to thoracic mobilization and DN combined with ES.  Improved thoracic joint mobility noted as well as soft tissue mobility.  Therapist closely monitoring response including vertigo with position changes.   ? Personal Factors and Comorbidities Age;Comorbidity 2;Time since onset of injury/illness/exacerbation;Comorbidity 1   ? Comorbidities DM; CKD; carpal tunnel   ? Examination-Activity Limitations Caring for Others;Carry;Lift;Bend;Squat   ? Rehab Potential Good   ? PT Frequency 1x / week   ? PT Duration 12 weeks   ? PT Treatment/Interventions ADLs/Self Care Home Management;Biofeedback;Cryotherapy;Electrical Stimulation;Moist Heat;Traction;Therapeutic activities;Therapeutic exercise;Neuromuscular re-education;Patient/family education;Manual techniques;Passive range of motion;Dry needling;Taping;Aquatic Therapy;Ultrasound   ? PT Next Visit Plan check results of MRI;  thoracic extension mobs PA  DN/ES combo to neck/thoracic; DN glutes, piriformis, cervical musculature as needed; U/S; ex; patient education   ? PT Home Exercise Plan Access Code: [email or web address]   ? ?  ?  ? ?  ? ? ?Patient will benefit from skilled therapeutic intervention in order to improve the following deficits and impairments:  Pain, Increased fascial restricitons, Decreased strength, Decreased activity tolerance, Decreased range of motion, Increased muscle spasms, Decreased  endurance, Impaired UE functional use ? ?Visit Diagnosis: ?Cervicalgia ? ?Muscle weakness (generalized) ? ?Lumbar degenerative disc disease ? ? ? ? ?Problem List ?Patient Active Problem List  ? Diagnosis Date Noted  ? C

## 2021-09-23 ENCOUNTER — Ambulatory Visit: Payer: Medicare Other | Admitting: Physical Therapy

## 2021-09-23 DIAGNOSIS — M542 Cervicalgia: Secondary | ICD-10-CM | POA: Diagnosis not present

## 2021-09-23 DIAGNOSIS — M6281 Muscle weakness (generalized): Secondary | ICD-10-CM

## 2021-09-23 NOTE — Therapy (Signed)
[facility] [place], [place], [address] Phone: [phone]   Fax:  [phone]  Physical Therapy Treatment  Patient Details  Name: Krista Austin MRN: [id number] Date of Birth: 04/11/1946 Referring Provider (PT): [name] PA   Encounter Date: 09/23/2021   PT End of Session - 09/23/21 1225     Visit Number 19    Date for PT Re-Evaluation 10/28/21    Authorization Type medicare A/B 20th visit prog note    Progress Note Due on Visit 70    PT Start Time 1018    PT Stop Time 1100    PT Time Calculation (min) 42 min    Activity Tolerance Patient tolerated treatment well             Past Medical History:  Diagnosis Date   A-fib ([place])    Adenomatous polyp    Arthritis    "mostly fingers" (10/07/2016)   At high risk for falls    Celiac disease    "I do not have the disease; 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" (10/07/2016)   Chronic back pain    "worse in my neck; lower back also affected" (10/07/2016)   Chronic cholecystitis 49/44/9675   Complication of anesthesia    "never fully regained my taste after my cervical fusion; I don't take much RX so I'm very sensitive to any sedation" (10/07/2016)   Concussion    X2   DDD (degenerative disc disease), cervical    lumbar   Depression    "hx; take Prozac to keep me stable" (10/07/2016)   Dizziness    caused by compression of cervical disc   Family history of adverse reaction to anesthesia    "daughters get PONV; one daughter breaks out severely; grandson got suspended in a state of anxiousness after kidney surgery; had to be held for hours"   GERD (gastroesophageal reflux disease)    Hyperlipidemia    OSA on CPAP    Pneumonia    "once as an adult" (10/07/2016)   Thyroid nodule    Type II diabetes mellitus ([place])    controlled by diet and exercide    Past Surgical History:  Procedure Laterality Date   ANTERIOR CERVICAL DECOMP/DISCECTOMY FUSION  2015   BACK  SURGERY     CARPAL TUNNEL RELEASE Bilateral    CATARACT EXTRACTION, BILATERAL Bilateral 09/10/2014 - 09/24/2014   by Bourg N/A 10/16/2016   Procedure: LAPAROSCOPIC CHOLECYSTECTOMY;  Surgeon: [name], MD;  Location: [facility];  Service: General;  Laterality: N/A;   LAPAROSCOPIC CHOLECYSTECTOMY  10/16/2016   LUMBAR [place] SURGERY  2007   TONSILLECTOMY  05/05/1983   TUBAL LIGATION  02/09/1979    There were no vitals filed for this visit.   Subjective Assessment - 09/23/21 1225     Subjective Did better after last time but then I overdid it doing housework and that really set me back.  Mid back pain, neck pain and vertigo.  Had MRI no new changes.    Pertinent History DM, stage III kidney; osteoporosis (mild)    Diagnostic tests MRI 02/04/21 C5-6 solid arthrodesis and no impingement; mild to moderat narrowing left C3-4 and right C 4-5, thoracic multilevel degenerative changes    Currently in Pain? Yes    Pain Score 7     Pain Location Thoracic  [facility] Adult PT Treatment/Exercise - 09/23/21 0001       Self-Care   Other Self-Care Comments  discussion of use of home cervical traction unit      Neck Exercises: Seated   Other Seated Exercise thoracic extension using a medium ball, blocking cervical movement    Other Seated Exercise discussed activity pacing and decompression several times a day      Manual Therapy   Soft tissue mobilization bil cervical multifidi, bil upper traps, cervical paraspinals, suboccipitals; thoracic paraspinals    Myofascial Release suboccipital release    Manual Traction 3x 30 sec              Trigger Point Dry Needling - 09/23/21 0001     Other Dry Needling thoracic paraspinals    Upper Trapezius Response Twitch reponse elicited;Palpable increased muscle length    Suboccipitals Response Twitch response elicited;Palpable increased muscle length    Cervical multifidi Response  Twitch reponse elicited;Palpable increased muscle length   thoracic paraspinals mid                    PT Short Term Goals - 08/05/21 1004       PT SHORT TERM GOAL #1   Title Pt will be independent with her initial HEP to decrease pain/modulate symptoms    Status Achieved      PT SHORT TERM GOAL #2   Title Pt will report at least 30% decrease in neck pain and UE pain/numbness.    Status Achieved      PT SHORT TERM GOAL #3   Title Nexk Disability Index improved to 38%    Status Achieved      PT SHORT TERM GOAL #4   Title Cervical flexion improved to 40 degrees and extension to 50 degrees needed for grooming/dressing tasks    Time 12    Period Weeks    Status Partially Met               PT Long Term Goals - 08/05/21 1001       PT LONG TERM GOAL #1   Title The patient will be independent in self management techniques and strategies needed to take care of herself and her son    Time 34    Period Weeks    Status On-going    Target Date 10/28/21      PT LONG TERM GOAL #2   Title Pt will report atleast 65% improvement in neck and UE paresthesia with daily activity or typically 3/10 with ADLs like writing, peeling fruits/vegetables and opening jars    Time 12    Period Weeks    Status Revised      PT LONG TERM GOAL #3   Title Improved cervical sidebending ROM improved to 40 degrees and rotation to 45 degrees needed for driving    Time 12    Period Weeks    Status On-going      PT LONG TERM GOAL #4   Title Neck Disability Index improved to 32% indicating improved function with less pain    Time 12    Period Weeks    Status On-going      PT LONG TERM GOAL #5   Title Cervical and periscapular strength improved to 4+/5 needed for lifting/ carrying bags    Status Achieved      PT LONG TERM GOAL #6   Title LBP and right peripheral symptoms improved by 65% or typically 3/10 with ADLs  Time 12    Period Weeks    Status On-going      PT LONG TERM GOAL  #7   Title Improve grip strength to 22# on right, left 18# needed for peeling fruits/vegetables as the cook of the household    Time 12    Period Weeks    Status Achieved                   Plan - 09/23/21 1230     Clinical Impression Statement The patient has difficulty lying prone for extended periods of time so did not do DN combined with ES today.  She responds well to thoracic  extension mob so we discussed seated extension using a ball as a fulcrum.  Also good response to suboccipital release and manual traction.  Therapist monitoring response throughout treatment session.    Personal Factors and Comorbidities Age;Comorbidity 2;Time since onset of injury/illness/exacerbation;Comorbidity 1    Comorbidities DM; CKD; carpal tunnel    Examination-Activity Limitations Caring for Others;Carry;Lift;Bend;Squat    Examination-Participation Restrictions Cleaning;Community Activity;Meal Prep    Rehab Potential Good    PT Frequency 1x / week    PT Duration 12 weeks    PT Treatment/Interventions ADLs/Self Care Home Management;Biofeedback;Cryotherapy;Electrical Stimulation;Moist Heat;Traction;Therapeutic activities;Therapeutic exercise;Neuromuscular re-education;Patient/family education;Manual techniques;Passive range of motion;Dry needling;Taping;Aquatic Therapy;Ultrasound    PT Next Visit Plan 20th visit prog note:  thoracic extension mobs PA  DN/ES combo to neck/thoracic; DN glutes, piriformis, cervical musculature as needed; U/S; ex; patient education    PT Home Exercise Plan Access Code: [facility]             Patient will benefit from skilled therapeutic intervention in order to improve the following deficits and impairments:  Pain, Increased fascial restricitons, Decreased strength, Decreased activity tolerance, Decreased range of motion, Increased muscle spasms, Decreased endurance, Impaired UE functional use  Visit Diagnosis: Cervicalgia  Muscle weakness  (generalized)     Problem List Patient Active Problem List   Diagnosis Date Noted   Closed fracture of 9th & 10th ribs of right side 12/17/2016   Pneumothorax on right 12/17/2016   Lumbar degenerative disc disease 12/17/2016   Essential hypertension 12/17/2016   Dyslipidemia 10/07/2016   Pulmonary nodule 10/07/2016   Abnormal CXR    Chest pain 10/06/2016   S/P lumbar spinal fusion 08/05/2016   Obstructive sleep apnea syndrome, mild 10/27/2013   Controlled type 2 diabetes mellitus without complication, without long-term current use of insulin ([place]) 06/19/2011   GAD (generalized anxiety disorder) 11/01/2009    Alvera Singh, PT 09/23/2021, 1:28 PM  [facility] [place], [place], [address] Phone: [phone]   Fax:  [phone]  Name: MERRIT WAUGH MRN: [id number] Date of Birth: [birth year]

## 2021-09-25 ENCOUNTER — Other Ambulatory Visit: Payer: Self-pay | Admitting: Physician Assistant

## 2021-09-25 DIAGNOSIS — Z1231 Encounter for screening mammogram for malignant neoplasm of breast: Secondary | ICD-10-CM

## 2021-10-07 ENCOUNTER — Ambulatory Visit: Payer: Medicare Other | Attending: Physician Assistant | Admitting: Physical Therapy

## 2021-10-07 ENCOUNTER — Ambulatory Visit: Payer: Medicare Other | Admitting: Physical Therapy

## 2021-10-07 DIAGNOSIS — M5136 Other intervertebral disc degeneration, lumbar region: Secondary | ICD-10-CM | POA: Diagnosis present

## 2021-10-07 DIAGNOSIS — M542 Cervicalgia: Secondary | ICD-10-CM

## 2021-10-07 DIAGNOSIS — M6281 Muscle weakness (generalized): Secondary | ICD-10-CM

## 2021-10-07 DIAGNOSIS — M51369 Other intervertebral disc degeneration, lumbar region without mention of lumbar back pain or lower extremity pain: Secondary | ICD-10-CM

## 2021-10-07 NOTE — Therapy (Signed)
[facility] @ [place] , [place], [address] Phone: [phone]   Fax:  [phone]  Physical Therapy Treatment   Patient Details  Name: Krista Austin MRN: [id number] Date of Birth: 07/01/1945 Referring Provider (PT): [name] PA  Progress Note Reporting Period 04/03/21 to 10/07/21  See note below for Objective Data and Assessment of Progress/Goals.     Encounter Date: 10/07/2021   PT End of Session - 10/07/21 1011     Visit Number 20    Date for PT Re-Evaluation 10/28/21    Authorization Type medicare A/B 20th visit prog note    Progress Note Due on Visit 20    PT Start Time 1015    PT Stop Time 1057    PT Time Calculation (min) 42 min    Activity Tolerance Patient tolerated treatment well             Past Medical History:  Diagnosis Date   A-fib ([place])    Adenomatous polyp    Arthritis    "mostly fingers" (10/07/2016)   At high risk for falls    Celiac disease    "I do not have the disease; 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" (10/07/2016)   Chronic back pain    "worse in my neck; lower back also affected" (10/07/2016)   Chronic cholecystitis 41/96/2229   Complication of anesthesia    "never fully regained my taste after my cervical fusion; I don't take much RX so I'm very sensitive to any sedation" (10/07/2016)   Concussion    X2   DDD (degenerative disc disease), cervical    lumbar   Depression    "hx; take Prozac to keep me stable" (10/07/2016)   Dizziness    caused by compression of cervical disc   Family history of adverse reaction to anesthesia    "daughters get PONV; one daughter breaks out severely; grandson got suspended in a state of anxiousness after kidney surgery; had to be held for hours"   GERD (gastroesophageal reflux disease)    Hyperlipidemia    OSA on CPAP    Pneumonia    "once as an adult" (10/07/2016)   Thyroid nodule    Type II diabetes mellitus ([place])    controlled by diet and  exercide    Past Surgical History:  Procedure Laterality Date   ANTERIOR CERVICAL DECOMP/DISCECTOMY FUSION  2015   BACK SURGERY     CARPAL TUNNEL RELEASE Bilateral    CATARACT EXTRACTION, BILATERAL Bilateral 09/10/2014 - 09/24/2014   by [place] N/A 10/16/2016   Procedure: LAPAROSCOPIC CHOLECYSTECTOMY;  Surgeon: [name], MD;  Location: [facility];  Service: General;  Laterality: N/A;   LAPAROSCOPIC CHOLECYSTECTOMY  10/16/2016   LUMBAR [place] SURGERY  2007   TONSILLECTOMY  05/05/1983   TUBAL LIGATION  02/09/1979    There were no vitals filed for this visit.   Subjective Assessment - 10/07/21 1015     Subjective You fixed me.  I've got a better handle on what I can do and what I can't.  The combination helped.  We're going to take a trip up [place] soon.  1 more appt.    Pertinent History DM, stage III kidney; osteoporosis (mild)    How long can you walk comfortably? 15-20 minutes (initially only 1/2 mile)  50 laps .8 mile    Diagnostic tests MRI 02/04/21 C5-6 solid arthrodesis and no impingement; mild to moderat narrowing left C3-4 and right  C 4-5, thoracic multilevel degenerative changes    Patient Stated Goals do 2-3 hours of housework    Currently in Pain? Yes    Pain Score 3     Pain Location Neck    Pain Type Chronic pain                [facility] PT Assessment - 10/07/21 0001       Observation/Other Assessments   Neck Disability Index  32%      AROM   Cervical Flexion 30    Cervical Extension 30    Cervical - Right Side Bend 25    Cervical - Left Side Bend 28    Cervical - Right Rotation 40    Cervical - Left Rotation 20    Lumbar Flexion 70    Lumbar Extension 15    Lumbar - Right Side Bend 30    Lumbar - Left Side Bend 25      Strength   Overall Strength Comments cervical and periscapular strength 4/5; lumbo/pelvic/hip strength grossly 4+/5    Right/Left hand Right;Left    Right Hand Grip (lbs) 20    Left Hand Grip (lbs) 12                            [facility] Adult PT Treatment/Exercise - 10/07/21 0001       Manual Therapy   Soft tissue mobilization bil cervical multifidi, bil upper traps, cervical paraspinals, suboccipitals; thoracic paraspinals    Myofascial Release suboccipital release    Manual Traction 3x 30 sec              Trigger Point Dry Needling - 10/07/21 0001     Consent Given? Yes    Other Dry Needling thoracic paraspinals    Upper Trapezius Response Twitch reponse elicited;Palpable increased muscle length    Suboccipitals Response Twitch response elicited;Palpable increased muscle length    Cervical multifidi Response Twitch reponse elicited;Palpable increased muscle length   thoracic paraspinals mid                    PT Short Term Goals - 05/13/21 1935       PT SHORT TERM GOAL #1   Title Pt will be independent with her initial HEP to decrease pain/modulate symptoms    Status Achieved      PT SHORT TERM GOAL #2   Title Pt will report at least 30% decrease in neck pain and UE pain/numbness.    Status Achieved      PT SHORT TERM GOAL #3   Title Nexk Disability Index improved to 38%    Status Achieved      PT SHORT TERM GOAL #4   Title Cervical flexion improved to 40 degrees and extension to 50 degrees needed for grooming/dressing tasks    Status Partially Met               PT Long Term Goals - 10/07/21 1028       PT LONG TERM GOAL #1   Title The patient will be independent in self management techniques and strategies needed to take care of herself and her son      PT LONG TERM GOAL #2   Title Pt will report atleast 65% improvement in neck and UE paresthesia with daily activity or typically 3/10 with ADLs like writing, peeling fruits/vegetables and opening jars    Status On-going  PT LONG TERM GOAL #3   Title Improved cervical sidebending ROM improved to 40 degrees and rotation to 45 degrees needed for driving    Status On-going      PT LONG  TERM GOAL #4   Title Neck Disability Index improved to 32% indicating improved function with less pain      PT LONG TERM GOAL #5   Title Cervical and periscapular strength improved to 4+/5 needed for lifting/ carrying bags    Baseline limits to avoid a flare up    Status Partially Met      PT LONG TERM GOAL #6   Title LBP and right peripheral symptoms improved by 65% or typically 3/10 with ADLs    Baseline achieved 6/6 but avoids lifting    Status Achieved      PT LONG TERM GOAL #7   Title Improve grip strength to 22# on right, left 18# needed for peeling fruits/vegetables as the cook of the household    Status On-going                   Plan - 10/07/21 1946     Clinical Impression Statement The patient reports relief from DN and manual therapy in helping her cope with her chronic cervical pain, thoracic region pain and headaches.  Her Neck Disability Index has improved from 36% to 32%.  We have discussed self care strategies for home including activity pacing, basic exercise and self traction so treatment.  She continues to have cervical ROM impairments in all planes and would benefit from further treatment to address this stiffness.  Her grip continues to be limited on the left 12# vs 20# on right.  Her back and hip pain has been very well controlled so we have focused recent treament on upper back and neck.  Recommend continued treatment to help prepare for upcoming travel and daily functions in care for son.    Personal Factors and Comorbidities Age;Comorbidity 2;Time since onset of injury/illness/exacerbation;Comorbidity 1    Examination-Activity Limitations Caring for Others;Carry;Lift;Bend;Squat    Examination-Participation Restrictions Cleaning;Community Activity;Meal Prep    Rehab Potential Good    PT Frequency 1x / week    PT Duration 12 weeks    PT Treatment/Interventions ADLs/Self Care Home Management;Biofeedback;Cryotherapy;Electrical Stimulation;Moist  Heat;Traction;Therapeutic activities;Therapeutic exercise;Neuromuscular re-education;Patient/family education;Manual techniques;Passive range of motion;Dry needling;Taping;Aquatic Therapy;Ultrasound    PT Next Visit Plan thoracic extension mobs [place]  DN/ES combo to neck/thoracic; DN glutes, piriformis, cervical musculature as needed; U/S; ex; patient education;  upcoming trip to [place]    PT Home Exercise Plan Access Code: [facility]             Patient will benefit from skilled therapeutic intervention in order to improve the following deficits and impairments:  Pain, Increased fascial restricitons, Decreased strength, Decreased activity tolerance, Decreased range of motion, Increased muscle spasms, Decreased endurance, Impaired UE functional use  Visit Diagnosis: Cervicalgia  Muscle weakness (generalized)  Lumbar degenerative disc disease     Problem List Patient Active Problem List   Diagnosis Date Noted   Closed fracture of 9th & 10th ribs of right side 12/17/2016   Pneumothorax on right 12/17/2016   Lumbar degenerative disc disease 12/17/2016   Essential hypertension 12/17/2016   Dyslipidemia 10/07/2016   Pulmonary nodule 10/07/2016   Abnormal CXR    Chest pain 10/06/2016   S/P lumbar spinal fusion 08/05/2016   Obstructive sleep apnea syndrome, mild 10/27/2013   Controlled type 2 diabetes mellitus without complication, without long-term  current use of insulin ([place]) 06/19/2011   GAD (generalized anxiety disorder) 11/01/2009    Alvera Singh, PT 10/07/2021, 7:52 PM  [facility] @ [address] [place], [place], [address] Phone: [phone]   Fax:  [phone]  Name: Krista Austin MRN: [id number] Date of Birth: [birth year]

## 2021-10-21 ENCOUNTER — Ambulatory Visit: Payer: Medicare Other | Admitting: Physical Therapy

## 2021-10-21 DIAGNOSIS — M6281 Muscle weakness (generalized): Secondary | ICD-10-CM

## 2021-10-21 DIAGNOSIS — M542 Cervicalgia: Secondary | ICD-10-CM

## 2021-10-21 DIAGNOSIS — M5136 Other intervertebral disc degeneration, lumbar region: Secondary | ICD-10-CM

## 2021-10-21 NOTE — Therapy (Signed)
[facility] @ [address] [place], [place], [address] Phone: [phone]   Fax:  [phone]  Physical Therapy Treatment/Recertification  Patient Details  Name: Krista Austin MRN: [id number] Date of Birth: May 13, 1945 Referring Provider (PT): [name] PA   Encounter Date: 10/21/2021   PT End of Session - 10/21/21 1015     Visit Number 21    Date for PT Re-Evaluation 01/13/22    Authorization Type medicare A/B 20th visit prog note    Progress Note Due on Visit 57    PT Start Time 8329    PT Stop Time 1100    PT Time Calculation (min) 45 min    Activity Tolerance Patient tolerated treatment well             Past Medical History:  Diagnosis Date   A-fib ([place])    Adenomatous polyp    Arthritis    "mostly fingers" (10/07/2016)   At high risk for falls    Celiac disease    "I do not have the disease; 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" (10/07/2016)   Chronic back pain    "worse in my neck; lower back also affected" (10/07/2016)   Chronic cholecystitis 19/16/6060   Complication of anesthesia    "never fully regained my taste after my cervical fusion; I don't take much RX so I'm very sensitive to any sedation" (10/07/2016)   Concussion    X2   DDD (degenerative disc disease), cervical    lumbar   Depression    "hx; take Prozac to keep me stable" (10/07/2016)   Dizziness    caused by compression of cervical disc   Family history of adverse reaction to anesthesia    "daughters get PONV; one daughter breaks out severely; grandson got suspended in a state of anxiousness after kidney surgery; had to be held for hours"   GERD (gastroesophageal reflux disease)    Hyperlipidemia    OSA on CPAP    Pneumonia    "once as an adult" (10/07/2016)   Thyroid nodule    Type II diabetes mellitus ([place])    controlled by diet and exercide    Past Surgical History:  Procedure Laterality Date   ANTERIOR CERVICAL DECOMP/DISCECTOMY FUSION   2015   BACK SURGERY     CARPAL TUNNEL RELEASE Bilateral    CATARACT EXTRACTION, BILATERAL Bilateral 09/10/2014 - 09/24/2014   by [place] N/A 10/16/2016   Procedure: LAPAROSCOPIC CHOLECYSTECTOMY;  Surgeon: [name], MD;  Location: [facility];  Service: General;  Laterality: N/A;   LAPAROSCOPIC CHOLECYSTECTOMY  10/16/2016   LUMBAR [place] SURGERY  2007   TONSILLECTOMY  05/05/1983   TUBAL LIGATION  02/09/1979    There were no vitals filed for this visit.   Subjective Assessment - 10/21/21 1016     Subjective Leaving for trip and won't be back until 7/13.  I might be able to move to decreased frequency.  Vertigo less intense but still daily.    Pertinent History DM, stage III kidney; osteoporosis (mild)    How long can you walk comfortably? 15-20 minutes (initially only 1/2 mile)  50 laps .8 mile    Diagnostic tests MRI 02/04/21 C5-6 solid arthrodesis and no impingement; mild to moderat narrowing left C3-4 and right C 4-5, thoracic multilevel degenerative changes    Currently in Pain? Yes    Pain Score 3     Pain Location Neck    Pain Type Chronic  pain                [facility] PT Assessment - 10/21/21 0001       Observation/Other Assessments   Neck Disability Index  32%      AROM   Cervical Flexion 30    Cervical Extension 30    Cervical - Right Side Bend 25    Cervical - Left Side Bend 28    Cervical - Right Rotation 40    Cervical - Left Rotation 20    Lumbar Flexion 70    Lumbar Extension 15    Lumbar - Right Side Bend 30    Lumbar - Left Side Bend 25      Strength   Overall Strength Comments cervical and periscapular strength 4/5; lumbo/pelvic/hip strength grossly 4+/5    Right Hand Grip (lbs) 20    Left Hand Grip (lbs) 12                           [facility] Adult PT Treatment/Exercise - 10/21/21 0001       Self-Care   Other Self-Care Comments  discussion of use of home cervical traction unit including weight amount and time of  use      Manual Therapy   Soft tissue mobilization bil cervical multifidi, bil upper traps, cervical paraspinals, suboccipitals; thoracic paraspinals    Myofascial Release suboccipital release    Manual Traction 3x 30 sec              Trigger Point Dry Needling - 10/21/21 0001     Consent Given? Yes    Other Dry Needling thoracic paraspinals    Upper Trapezius Response Twitch reponse elicited;Palpable increased muscle length    Suboccipitals Response Twitch response elicited;Palpable increased muscle length    Cervical multifidi Response Twitch reponse elicited;Palpable increased muscle length   thoracic paraspinals mid   Gluteus Minimus Response Twitch response elicited;Palpable increased muscle length    Gluteus Medius Response Twitch response elicited;Palpable increased muscle length    Gluteus Maximus Response Palpable increased muscle length;Twitch response elicited    Piriformis Response Palpable increased muscle length;Twitch response elicited                     PT Short Term Goals - 05/13/21 1935       PT SHORT TERM GOAL #1   Title Pt will be independent with her initial HEP to decrease pain/modulate symptoms    Status Achieved      PT SHORT TERM GOAL #2   Title Pt will report at least 30% decrease in neck pain and UE pain/numbness.    Status Achieved      PT SHORT TERM GOAL #3   Title Nexk Disability Index improved to 38%    Status Achieved      PT SHORT TERM GOAL #4   Title Cervical flexion improved to 40 degrees and extension to 50 degrees needed for grooming/dressing tasks    Status Partially Met               PT Long Term Goals - 10/21/21 1451       PT LONG TERM GOAL #1   Time 12    Period Weeks    Status On-going    Target Date 01/13/22      PT LONG TERM GOAL #2   Title Pt will report at least 65% improvement in neck and UE paresthesia with  daily activity or typically 3/10 with ADLs like writing, peeling fruits/vegetables and  opening jars    Time 12    Period Weeks    Status On-going      PT LONG TERM GOAL #3   Title Improved cervical sidebending ROM improved to 40 degrees and rotation to 45 degrees needed for driving    Time 12    Period Weeks    Status On-going      PT LONG TERM GOAL #4   Title Neck Disability Index improved to 32% indicating improved function with less pain    Time 12    Period Weeks    Status On-going      PT LONG TERM GOAL #5   Title Cervical and periscapular strength improved to 4+/5 needed for lifting/ carrying bags    Time 12    Period Weeks    Status Partially Met      PT LONG TERM GOAL #6   Title LBP and right peripheral symptoms improved by 65% or typically 3/10 with ADLs    Time 12    Period Weeks    Status Achieved      PT LONG TERM GOAL #7   Title Improve grip strength to 22# on right, left 18# needed for peeling fruits/vegetables as the cook of the household    Time 12    Period Weeks    Status On-going                   Plan - 10/21/21 1446     Clinical Impression Statement The patient has improved Neck Disability Index from 36% to 32%.  She has had significant improvement in back and hip pain level overall.  Improving cervical ROM although her grip continues to be limited on the left 12# compared to 20# on right.  She reports she is improving with activity pacing, basic ex and self management techniques including home traction.  She has an upcoming trip out of state and would like to follow up with PT on her return.  She feels PT especially including DN and manual techniques has been highly effective in managing these chronic issues.    Personal Factors and Comorbidities Age;Comorbidity 2;Time since onset of injury/illness/exacerbation;Comorbidity 1    Comorbidities DM; CKD; carpal tunnel    Examination-Participation Restrictions Cleaning;Community Activity;Meal Prep    Rehab Potential Good    PT Frequency Biweekly    PT Duration 12 weeks    PT  Treatment/Interventions ADLs/Self Care Home Management;Biofeedback;Cryotherapy;Electrical Stimulation;Moist Heat;Traction;Therapeutic activities;Therapeutic exercise;Neuromuscular re-education;Patient/family education;Manual techniques;Passive range of motion;Dry needling;Taping;Aquatic Therapy;Ultrasound    PT Next Visit Plan thoracic extension mobs [place]  DN/ES combo to neck/thoracic; DN glutes, piriformis, cervical musculature as needed; U/S; ex; patient education;  upcoming trip to [place];  decrease treatment frequency    PT Home Exercise Plan Access Code: [facility]             Patient will benefit from skilled therapeutic intervention in order to improve the following deficits and impairments:  Pain, Increased fascial restricitons, Decreased strength, Decreased activity tolerance, Decreased range of motion, Increased muscle spasms, Decreased endurance, Impaired UE functional use  Visit Diagnosis: Cervicalgia - Plan: PT plan of care cert/re-cert  Muscle weakness (generalized) - Plan: PT plan of care cert/re-cert  Lumbar degenerative disc disease - Plan: PT plan of care cert/re-cert     Problem List Patient Active Problem List   Diagnosis Date Noted   Closed fracture of 9th & 10th ribs of  right side 12/17/2016   Pneumothorax on right 12/17/2016   Lumbar degenerative disc disease 12/17/2016   Essential hypertension 12/17/2016   Dyslipidemia 10/07/2016   Pulmonary nodule 10/07/2016   Abnormal CXR    Chest pain 10/06/2016   S/P lumbar spinal fusion 08/05/2016   Obstructive sleep apnea syndrome, mild 10/27/2013   Controlled type 2 diabetes mellitus without complication, without long-term current use of insulin ([place]) 06/19/2011   GAD (generalized anxiety disorder) 11/01/2009    Alvera Singh, PT 10/21/2021, 2:54 PM  [facility] @ [address] [place] [place], [place], [address] Phone: [phone]   Fax:  [phone]  Name: Krista Austin MRN: [id number] Date of Birth: [birth year]

## 2021-10-23 ENCOUNTER — Ambulatory Visit: Payer: Medicare Other

## 2021-11-20 ENCOUNTER — Ambulatory Visit: Payer: Medicare Other

## 2021-11-28 ENCOUNTER — Ambulatory Visit: Payer: Medicare Other | Attending: Physician Assistant | Admitting: Physical Therapy

## 2021-11-28 DIAGNOSIS — M542 Cervicalgia: Secondary | ICD-10-CM | POA: Diagnosis not present

## 2021-11-28 DIAGNOSIS — M5136 Other intervertebral disc degeneration, lumbar region: Secondary | ICD-10-CM | POA: Diagnosis present

## 2021-11-28 DIAGNOSIS — M6281 Muscle weakness (generalized): Secondary | ICD-10-CM | POA: Insufficient documentation

## 2021-11-28 NOTE — Therapy (Signed)
[facility] @ [place], [place], [address] Phone: [phone]   Fax:  [phone]  Physical Therapy Treatment  Patient Details  Name: Krista Austin MRN: [id number] Date of Birth: 27-May-1945 Referring Provider (PT): [name] PA   Encounter Date: 11/28/2021   PT End of Session - 11/28/21 0921     Visit Number 22    Date for PT Re-Evaluation 01/13/22    Authorization Type medicare A/B 30th visit prog note    Progress Note Due on Visit 28    PT Start Time 0930    PT Stop Time 1012    PT Time Calculation (min) 42 min    Activity Tolerance Patient tolerated treatment well             Past Medical History:  Diagnosis Date   A-fib ([place])    Adenomatous polyp    Arthritis    "mostly fingers" (10/07/2016)   At high risk for falls    Celiac disease    "I do not have the disease; 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" (10/07/2016)   Chronic back pain    "worse in my neck; lower back also affected" (10/07/2016)   Chronic cholecystitis 26/71/2458   Complication of anesthesia    "never fully regained my taste after my cervical fusion; I don't take much RX so I'm very sensitive to any sedation" (10/07/2016)   Concussion    X2   DDD (degenerative disc disease), cervical    lumbar   Depression    "hx; take Prozac to keep me stable" (10/07/2016)   Dizziness    caused by compression of cervical disc   Family history of adverse reaction to anesthesia    "daughters get PONV; one daughter breaks out severely; grandson got suspended in a state of anxiousness after kidney surgery; had to be held for hours"   GERD (gastroesophageal reflux disease)    Hyperlipidemia    OSA on CPAP    Pneumonia    "once as an adult" (10/07/2016)   Thyroid nodule    Type II diabetes mellitus ([place])    controlled by diet and exercide    Past Surgical History:  Procedure Laterality Date   ANTERIOR CERVICAL DECOMP/DISCECTOMY FUSION  2015   BACK  SURGERY     CARPAL TUNNEL RELEASE Bilateral    CATARACT EXTRACTION, BILATERAL Bilateral 09/10/2014 - 09/24/2014   by [place] N/A 10/16/2016   Procedure: LAPAROSCOPIC CHOLECYSTECTOMY;  Surgeon: [name], MD;  Location: [facility];  Service: General;  Laterality: N/A;   LAPAROSCOPIC CHOLECYSTECTOMY  10/16/2016   LUMBAR [place] SURGERY  2007   TONSILLECTOMY  05/05/1983   TUBAL LIGATION  02/09/1979    There were no vitals filed for this visit.   Subjective Assessment - 11/28/21 0925     Subjective Trip got cancelled b/c of a bad leak at home/repairs.  I had to move furniture.  A lot of neck and arm pain.  I'm going to go the neurosurgeon to have the carpal tunnel looked.  Right hip pain as well.    How long can you walk comfortably? 15-20 minutes (initially only 1/2 mile)  50 laps .8 mile    Diagnostic tests MRI 02/04/21 C5-6 solid arthrodesis and no impingement; mild to moderat narrowing left C3-4 and right C 4-5, thoracic multilevel degenerative changes    Patient Stated Goals do 2-3 hours of housework    Currently in Pain? Yes  Pain Score 5     Pain Location Neck    Pain Orientation Right;Left    Pain Type Chronic pain    Pain Score 3    Pain Location Hip    Pain Orientation Right    Pain Type Chronic pain                               [facility] Adult PT Treatment/Exercise - 11/28/21 0001       Electrical Stimulation   Electrical Stimulation Location bil cervical multifidi in L4-/S1 and gluteal region    Electrical Stimulation Action 2.5 ma    Electrical Stimulation Parameters 8    Electrical Stimulation Goals Pain      Manual Therapy   Soft tissue mobilization bil cervical multifidi, bil upper traps, cervical paraspinals, suboccipitals; thoracic paraspinals              Trigger Point Dry Needling - 11/28/21 0001     Consent Given? Yes    Electrical Stimulation Performed with Dry Needling Yes    E-stim with Dry Needling Details  cervical multifidi    Upper Trapezius Response Twitch reponse elicited;Palpable increased muscle length    Suboccipitals Response Twitch response elicited;Palpable increased muscle length    Cervical multifidi Response Twitch reponse elicited;Palpable increased muscle length   thoracic paraspinals mid   Gluteus Minimus Response Twitch response elicited;Palpable increased muscle length    Gluteus Medius Response Twitch response elicited;Palpable increased muscle length    Gluteus Maximus Response Palpable increased muscle length;Twitch response elicited    Piriformis Response Palpable increased muscle length;Twitch response elicited                     PT Short Term Goals - 08/05/21 1004       PT SHORT TERM GOAL #1   Title Pt will be independent with her initial HEP to decrease pain/modulate symptoms    Status Achieved      PT SHORT TERM GOAL #2   Title Pt will report at least 30% decrease in neck pain and UE pain/numbness.    Status Achieved      PT SHORT TERM GOAL #3   Title Nexk Disability Index improved to 38%    Status Achieved      PT SHORT TERM GOAL #4   Title Cervical flexion improved to 40 degrees and extension to 50 degrees needed for grooming/dressing tasks    Time 12    Period Weeks    Status Partially Met               PT Long Term Goals - 10/21/21 1451       PT LONG TERM GOAL #1   Time 12    Period Weeks    Status On-going    Target Date 01/13/22      PT LONG TERM GOAL #2   Title Pt will report at least 65% improvement in neck and UE paresthesia with daily activity or typically 3/10 with ADLs like writing, peeling fruits/vegetables and opening jars    Time 12    Period Weeks    Status On-going      PT LONG TERM GOAL #3   Title Improved cervical sidebending ROM improved to 40 degrees and rotation to 45 degrees needed for driving    Time 12    Period Weeks    Status On-going      PT LONG  TERM GOAL #4   Title Neck Disability Index  improved to 32% indicating improved function with less pain    Time 12    Period Weeks    Status On-going      PT LONG TERM GOAL #5   Title Cervical and periscapular strength improved to 4+/5 needed for lifting/ carrying bags    Time 12    Period Weeks    Status Partially Met      PT LONG TERM GOAL #6   Title LBP and right peripheral symptoms improved by 65% or typically 3/10 with ADLs    Time 12    Period Weeks    Status Achieved      PT LONG TERM GOAL #7   Title Improve grip strength to 22# on right, left 18# needed for peeling fruits/vegetables as the cook of the household    Time 12    Period Weeks    Status On-going                   Plan - 11/28/21 1002     Clinical Impression Statement The patient has responded well to DN combined with ES.  Much improved soft tissue mobility noted in both gluteals and cervical musculature.  Therapist monitoring response with all interventions and modifying treatment based on response.    Personal Factors and Comorbidities Age;Comorbidity 2;Time since onset of injury/illness/exacerbation;Comorbidity 1    Comorbidities DM; CKD; carpal tunnel    Examination-Activity Limitations Caring for Others;Carry;Lift;Bend;Squat    Stability/Clinical Decision Making Evolving/Moderate complexity    Rehab Potential Good    PT Frequency Biweekly    PT Duration 12 weeks    PT Treatment/Interventions ADLs/Self Care Home Management;Biofeedback;Cryotherapy;Electrical Stimulation;Moist Heat;Traction;Therapeutic activities;Therapeutic exercise;Neuromuscular re-education;Patient/family education;Manual techniques;Passive range of motion;Dry needling;Taping;Aquatic Therapy;Ultrasound    PT Next Visit Plan thoracic extension mobs PA  DN/ES combo to neck/thoracic; DN glutes, piriformis, cervical musculature as needed; U/S; ex; patient education;  upcoming trip to [place];  decrease treatment frequency    PT Home Exercise Plan Access Code: [facility]              Patient will benefit from skilled therapeutic intervention in order to improve the following deficits and impairments:  Pain, Increased fascial restricitons, Decreased strength, Decreased activity tolerance, Decreased range of motion, Increased muscle spasms, Decreased endurance, Impaired UE functional use  Visit Diagnosis: Cervicalgia  Muscle weakness (generalized)  Lumbar degenerative disc disease     Problem List Patient Active Problem List   Diagnosis Date Noted   Closed fracture of 9th & 10th ribs of right side 12/17/2016   Pneumothorax on right 12/17/2016   Lumbar degenerative disc disease 12/17/2016   Essential hypertension 12/17/2016   Dyslipidemia 10/07/2016   Pulmonary nodule 10/07/2016   Abnormal CXR    Chest pain 10/06/2016   S/P lumbar spinal fusion 08/05/2016   Obstructive sleep apnea syndrome, mild 10/27/2013   Controlled type 2 diabetes mellitus without complication, without long-term current use of insulin ([place]) 06/19/2011   GAD (generalized anxiety disorder) 11/01/2009    Alvera Singh, PT 11/28/2021, 10:15 AM  [facility] @ [place], [place], [address] Phone: [phone]   Fax:  [phone]  Name: NHYLA NAPPI MRN: [id number] Date of Birth: 08-Jun-1945

## 2021-12-09 ENCOUNTER — Ambulatory Visit: Payer: Medicare Other | Attending: Physician Assistant | Admitting: Physical Therapy

## 2021-12-09 DIAGNOSIS — M5136 Other intervertebral disc degeneration, lumbar region: Secondary | ICD-10-CM | POA: Diagnosis present

## 2021-12-09 DIAGNOSIS — M542 Cervicalgia: Secondary | ICD-10-CM | POA: Diagnosis not present

## 2021-12-09 DIAGNOSIS — M6281 Muscle weakness (generalized): Secondary | ICD-10-CM | POA: Insufficient documentation

## 2021-12-09 NOTE — Therapy (Signed)
[facility] @ [place], [place], [address] Phone: [phone]   Fax:  [phone]  Physical Therapy Treatment  Patient Details  Name: Krista Austin MRN: [id number] Date of Birth: Jun 02, 1945 Referring Provider (PT): [name] PA   Encounter Date: 12/09/2021   PT End of Session - 12/09/21 1011     Visit Number 23    Date for PT Re-Evaluation 01/13/22    Authorization Type medicare A/B 30th visit prog note    Progress Note Due on Visit 9    PT Start Time 7673    PT Stop Time 1055   DN   PT Time Calculation (min) 40 min    Activity Tolerance Patient tolerated treatment well             Past Medical History:  Diagnosis Date   A-fib ([place])    Adenomatous polyp    Arthritis    "mostly fingers" (10/07/2016)   At high risk for falls    Celiac disease    "I do not have the disease; 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" (10/07/2016)   Chronic back pain    "worse in my neck; lower back also affected" (10/07/2016)   Chronic cholecystitis 41/93/7902   Complication of anesthesia    "never fully regained my taste after my cervical fusion; I don't take much RX so I'm very sensitive to any sedation" (10/07/2016)   Concussion    X2   DDD (degenerative disc disease), cervical    lumbar   Depression    "hx; take Prozac to keep me stable" (10/07/2016)   Dizziness    caused by compression of cervical disc   Family history of adverse reaction to anesthesia    "daughters get PONV; one daughter breaks out severely; grandson got suspended in a state of anxiousness after kidney surgery; had to be held for hours"   GERD (gastroesophageal reflux disease)    Hyperlipidemia    OSA on CPAP    Pneumonia    "once as an adult" (10/07/2016)   Thyroid nodule    Type II diabetes mellitus ([place])    controlled by diet and exercide    Past Surgical History:  Procedure Laterality Date   ANTERIOR CERVICAL DECOMP/DISCECTOMY FUSION  2015   BACK  SURGERY     CARPAL TUNNEL RELEASE Bilateral    CATARACT EXTRACTION, BILATERAL Bilateral 09/10/2014 - 09/24/2014   by [place] N/A 10/16/2016   Procedure: LAPAROSCOPIC CHOLECYSTECTOMY;  Surgeon: [name], MD;  Location: [facility];  Service: General;  Laterality: N/A;   LAPAROSCOPIC CHOLECYSTECTOMY  10/16/2016   LUMBAR [place] SURGERY  2007   TONSILLECTOMY  05/05/1983   TUBAL LIGATION  02/09/1979    There were no vitals filed for this visit.   Subjective Assessment - 12/09/21 1011     Patient is accompained by: Family member   stephen   Pertinent History DM, stage III kidney; osteoporosis (mild)    How long can you walk comfortably? 15-20 minutes (initially only 1/2 mile)  50 laps .8 mile    Diagnostic tests MRI 02/04/21 C5-6 solid arthrodesis and no impingement; mild to moderat narrowing left C3-4 and right C 4-5, thoracic multilevel degenerative changes    Currently in Pain? Yes                               [facility] Adult PT Treatment/Exercise - 12/09/21  0001       Manual Therapy   Manual therapy comments soft tissue mob to right wrist extensors    Joint Mobilization C4-7 PA mobs grade 2 30 sec each level    Soft tissue mobilization bil cervical multifidi, bil upper traps, cervical paraspinals, suboccipitals; thoracic paraspinals    Myofascial Release suboccipital release    Manual Traction 3x 30 sec              Trigger Point Dry Needling - 12/09/21 0001     Consent Given? Yes    Other Dry Needling right ECRB    Upper Trapezius Response Twitch reponse elicited;Palpable increased muscle length    Suboccipitals Response Twitch response elicited;Palpable increased muscle length    Cervical multifidi Response Twitch reponse elicited;Palpable increased muscle length   thoracic paraspinals mid   Gluteus Minimus Response Twitch response elicited;Palpable increased muscle length    Gluteus Medius Response Twitch response elicited;Palpable  increased muscle length    Gluteus Maximus Response Palpable increased muscle length;Twitch response elicited    Piriformis Response Palpable increased muscle length;Twitch response elicited                     PT Short Term Goals - 05/13/21 1935       PT SHORT TERM GOAL #1   Title Pt will be independent with her initial HEP to decrease pain/modulate symptoms    Status Achieved      PT SHORT TERM GOAL #2   Title Pt will report at least 30% decrease in neck pain and UE pain/numbness.    Status Achieved      PT SHORT TERM GOAL #3   Title Nexk Disability Index improved to 38%    Status Achieved      PT SHORT TERM GOAL #4   Title Cervical flexion improved to 40 degrees and extension to 50 degrees needed for grooming/dressing tasks    Status Partially Met               PT Long Term Goals - 10/21/21 1451       PT LONG TERM GOAL #1   Time 12    Period Weeks    Status On-going    Target Date 01/13/22      PT LONG TERM GOAL #2   Title Pt will report at least 65% improvement in neck and UE paresthesia with daily activity or typically 3/10 with ADLs like writing, peeling fruits/vegetables and opening jars    Time 12    Period Weeks    Status On-going      PT LONG TERM GOAL #3   Title Improved cervical sidebending ROM improved to 40 degrees and rotation to 45 degrees needed for driving    Time 12    Period Weeks    Status On-going      PT LONG TERM GOAL #4   Title Neck Disability Index improved to 32% indicating improved function with less pain    Time 12    Period Weeks    Status On-going      PT LONG TERM GOAL #5   Title Cervical and periscapular strength improved to 4+/5 needed for lifting/ carrying bags    Time 12    Period Weeks    Status Partially Met      PT LONG TERM GOAL #6   Title LBP and right peripheral symptoms improved by 65% or typically 3/10 with ADLs    Time 12  Period Weeks    Status Achieved      PT LONG TERM GOAL #7   Title  Improve grip strength to 22# on right, left 18# needed for peeling fruits/vegetables as the cook of the household    Time 12    Period Weeks    Status On-going                   Plan - 12/09/21 2010     Clinical Impression Statement Discussed her recent worsening of symptoms and suggested follow up with neurology.  Good response to DN to right cervical/upper quarter as well as right posterior hip musculature providing temporary relief of pain.  Therapist monitoring response throughout treatment session.    Personal Factors and Comorbidities Age;Comorbidity 2;Time since onset of injury/illness/exacerbation;Comorbidity 1    Comorbidities DM; CKD; carpal tunnel    Examination-Activity Limitations Caring for Others;Carry;Lift;Bend;Squat    Examination-Participation Restrictions Cleaning;Community Activity;Meal Prep    Rehab Potential Good    PT Frequency Biweekly    PT Duration 12 weeks    PT Treatment/Interventions ADLs/Self Care Home Management;Biofeedback;Cryotherapy;Electrical Stimulation;Moist Heat;Traction;Therapeutic activities;Therapeutic exercise;Neuromuscular re-education;Patient/family education;Manual techniques;Passive range of motion;Dry needling;Taping;Aquatic Therapy;Ultrasound    PT Next Visit Plan cervical mobs PA;   DN/ES combo to neck/thoracic; DN glutes, piriformis, cervical musculature as needed; U/S; ex; patient education    PT Home Exercise Plan Access Code: [facility]             Patient will benefit from skilled therapeutic intervention in order to improve the following deficits and impairments:  Pain, Increased fascial restricitons, Decreased strength, Decreased activity tolerance, Decreased range of motion, Increased muscle spasms, Decreased endurance, Impaired UE functional use  Visit Diagnosis: Cervicalgia  Muscle weakness (generalized)  Lumbar degenerative disc disease     Problem List Patient Active Problem List   Diagnosis Date Noted    Closed fracture of 9th & 10th ribs of right side 12/17/2016   Pneumothorax on right 12/17/2016   Lumbar degenerative disc disease 12/17/2016   Essential hypertension 12/17/2016   Dyslipidemia 10/07/2016   Pulmonary nodule 10/07/2016   Abnormal CXR    Chest pain 10/06/2016   S/P lumbar spinal fusion 08/05/2016   Obstructive sleep apnea syndrome, mild 10/27/2013   Controlled type 2 diabetes mellitus without complication, without long-term current use of insulin ([place]) 06/19/2011   GAD (generalized anxiety disorder) 11/01/2009   Ruben Im, PT 12/09/21 8:15 PM Phone: [phone] Fax: [facility] @ [address] [place], [place], [address] Phone: [phone]   Fax:  [phone]  Name: ASPYNN CLOVER MRN: [id number] Date of Birth: 08-05-1945

## 2021-12-17 ENCOUNTER — Ambulatory Visit
Admission: RE | Admit: 2021-12-17 | Discharge: 2021-12-17 | Disposition: A | Discharge status: Home / Self Care | Payer: Medicare Other | Source: Ambulatory Visit | Attending: Physician Assistant | Admitting: Physician Assistant

## 2021-12-17 DIAGNOSIS — Z1231 Encounter for screening mammogram for malignant neoplasm of breast: Secondary | ICD-10-CM

## 2021-12-23 ENCOUNTER — Ambulatory Visit: Payer: Medicare Other | Admitting: Physical Therapy

## 2021-12-23 DIAGNOSIS — M542 Cervicalgia: Secondary | ICD-10-CM

## 2021-12-23 DIAGNOSIS — M5136 Other intervertebral disc degeneration, lumbar region: Secondary | ICD-10-CM

## 2021-12-23 DIAGNOSIS — M6281 Muscle weakness (generalized): Secondary | ICD-10-CM

## 2021-12-23 NOTE — Therapy (Signed)
[facility] @ [address] [place] [place], [place], [address] Phone: [phone]   Fax:  [phone]  Physical Therapy Treatment  Patient Details  Name: Krista Austin MRN: [id number] Date of Birth: Jul 20, 1945 Referring Provider (PT): [name] PA   Encounter Date: 12/23/2021   PT End of Session - 12/23/21 1016     Visit Number 24    Date for PT Re-Evaluation 01/13/22    Authorization Type medicare A/B 30th visit prog note    Progress Note Due on Visit 30    PT Start Time 1016    PT Stop Time 1100    PT Time Calculation (min) 44 min    Activity Tolerance Patient tolerated treatment well             Past Medical History:  Diagnosis Date   A-fib ([place])    Adenomatous polyp    Arthritis    "mostly fingers" (10/07/2016)   At high risk for falls    Celiac disease    "I do not have the disease; 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" (10/07/2016)   Chronic back pain    "worse in my neck; lower back also affected" (10/07/2016)   Chronic cholecystitis 81/85/6314   Complication of anesthesia    "never fully regained my taste after my cervical fusion; I don't take much RX so I'm very sensitive to any sedation" (10/07/2016)   Concussion    X2   DDD (degenerative disc disease), cervical    lumbar   Depression    "hx; take Prozac to keep me stable" (10/07/2016)   Dizziness    caused by compression of cervical disc   Family history of adverse reaction to anesthesia    "daughters get PONV; one daughter breaks out severely; grandson got suspended in a state of anxiousness after kidney surgery; had to be held for hours"   GERD (gastroesophageal reflux disease)    Hyperlipidemia    OSA on CPAP    Pneumonia    "once as an adult" (10/07/2016)   Thyroid nodule    Type II diabetes mellitus ([place])    controlled by diet and exercide    Past Surgical History:  Procedure Laterality Date   ANTERIOR CERVICAL DECOMP/DISCECTOMY FUSION  2015   BACK  SURGERY     CARPAL TUNNEL RELEASE Bilateral    CATARACT EXTRACTION, BILATERAL Bilateral 09/10/2014 - 09/24/2014   by [place] N/A 10/16/2016   Procedure: LAPAROSCOPIC CHOLECYSTECTOMY;  Surgeon: [name], MD;  Location: [facility];  Service: General;  Laterality: N/A;   LAPAROSCOPIC CHOLECYSTECTOMY  10/16/2016   LUMBAR [place] SURGERY  2007   TONSILLECTOMY  05/05/1983   TUBAL LIGATION  02/09/1979    There were no vitals filed for this visit.   Subjective Assessment - 12/23/21 1016     Subjective Sneeze caused thoracic region pain.  Had a new MRI see results below.  Patient brought copy for therapist review.  Sees neuro practioner this afternoon.   I'm not looking to have a surgery.  Lot of dizziness the last few days.  Lower back much better.    Pertinent History DM, stage III kidney; osteoporosis (mild)    How long can you walk comfortably? 15-20 minutes (initially only 1/2 mile)  50 laps .8 mile    Diagnostic tests MRI 02/04/21 C5-6 solid arthrodesis and no impingement; mild to moderat narrowing left C3-4 and right C 4-5, thoracic multilevel degenerative changes;  MRI on Aug  11: Right C4-5 foraminal stenosis;  Left C4-5 impinge C5 nerve root; Mod stenosis C3-4; C6-7 broad disc protrusion    Currently in Pain? Yes    Pain Score 5     Pain Location Neck    Pain Orientation Right;Left    Pain Type Chronic pain                               [facility] Adult PT Treatment/Exercise - 12/23/21 0001       Self-Care   Other Self-Care Comments  discussion of newest MRI results and strategies of decompress and avoid motions that compress (cervical extension)      Manual Therapy   Manual therapy comments soft tissue mob to right wrist extensors    Joint Mobilization C4-7 PA mobs grade 2 30 sec each level; T sp PA grade 2 mobs 15 sec each level 2x    Soft tissue mobilization bil cervical multifidi, bil upper traps, cervical paraspinals, suboccipitals; thoracic  paraspinals    Myofascial Release suboccipital release    Manual Traction 3x 30 sec              Trigger Point Dry Needling - 12/23/21 0001     Consent Given? Yes    Other Dry Needling bil mid thoracic multifidi    Upper Trapezius Response Twitch reponse elicited;Palpable increased muscle length    Suboccipitals Response Twitch response elicited;Palpable increased muscle length    Cervical multifidi Response Twitch reponse elicited;Palpable increased muscle length   thoracic paraspinals mid                    PT Short Term Goals - 05/13/21 1935       PT SHORT TERM GOAL #1   Title Pt will be independent with her initial HEP to decrease pain/modulate symptoms    Status Achieved      PT SHORT TERM GOAL #2   Title Pt will report at least 30% decrease in neck pain and UE pain/numbness.    Status Achieved      PT SHORT TERM GOAL #3   Title Nexk Disability Index improved to 38%    Status Achieved      PT SHORT TERM GOAL #4   Title Cervical flexion improved to 40 degrees and extension to 50 degrees needed for grooming/dressing tasks    Status Partially Met               PT Long Term Goals - 12/23/21 1120       PT LONG TERM GOAL #1   Title The patient will be independent in self management techniques and strategies needed to take care of herself and her son    Time 27    Period Weeks    Status On-going    Target Date 01/13/22      PT LONG TERM GOAL #2   Title Pt will report at least 65% improvement in neck and UE paresthesia with daily activity or typically 3/10 with ADLs like writing, peeling fruits/vegetables and opening jars    Time 12    Period Weeks    Status On-going      PT LONG TERM GOAL #3   Title Improved cervical sidebending ROM improved to 40 degrees and rotation to 45 degrees needed for driving    Time 12    Period Weeks    Status On-going      PT LONG  TERM GOAL #4   Title Neck Disability Index improved to 32% indicating improved  function with less pain    Time 12    Period Weeks    Status On-going      PT LONG TERM GOAL #5   Title Cervical and periscapular strength improved to 4+/5 needed for lifting/ carrying bags    Time 12    Period Weeks    Status Partially Met      PT LONG TERM GOAL #6   Title LBP and right peripheral symptoms improved by 65% or typically 3/10 with ADLs    Status Achieved      PT LONG TERM GOAL #7   Title Improve grip strength to 22# on right, left 18# needed for peeling fruits/vegetables as the cook of the household    Time 12    Period Weeks    Status On-going                   Plan - 12/23/21 1115     Clinical Impression Statement The patient will see the neurologist this afternoon to discuss newest MRI findings.  Multiple levels affected bilaterally.  The patient feels PT helps to manage her symptoms.  She responds well to manual therapy and dry needling with decreased pain, improved soft tissue and joint mobility noted following.  Therapist monitoring repsonse throughout treatment session.    Personal Factors and Comorbidities Age;Comorbidity 2;Time since onset of injury/illness/exacerbation;Comorbidity 1    Comorbidities DM; CKD; carpal tunnel    Examination-Activity Limitations Caring for Others;Carry;Lift;Bend;Squat    Rehab Potential Good    PT Frequency Biweekly    PT Duration 12 weeks    PT Treatment/Interventions ADLs/Self Care Home Management;Biofeedback;Cryotherapy;Electrical Stimulation;Moist Heat;Traction;Therapeutic activities;Therapeutic exercise;Neuromuscular re-education;Patient/family education;Manual techniques;Passive range of motion;Dry needling;Taping;Aquatic Therapy;Ultrasound    PT Next Visit Plan recheck ROM; grip strength, NDI;  cervical mobs PA;   DN/ES combo to neck/thoracic; DN glutes, piriformis, cervical musculature as needed; U/S; ex; patient education    PT Home Exercise Plan Access Code: [facility]             Patient will benefit  from skilled therapeutic intervention in order to improve the following deficits and impairments:  Pain, Increased fascial restricitons, Decreased strength, Decreased activity tolerance, Decreased range of motion, Increased muscle spasms, Decreased endurance, Impaired UE functional use  Visit Diagnosis: Cervicalgia  Muscle weakness (generalized)  Lumbar degenerative disc disease     Problem List Patient Active Problem List   Diagnosis Date Noted   Closed fracture of 9th & 10th ribs of right side 12/17/2016   Pneumothorax on right 12/17/2016   Lumbar degenerative disc disease 12/17/2016   Essential hypertension 12/17/2016   Dyslipidemia 10/07/2016   Pulmonary nodule 10/07/2016   Abnormal CXR    Chest pain 10/06/2016   S/P lumbar spinal fusion 08/05/2016   Obstructive sleep apnea syndrome, mild 10/27/2013   Controlled type 2 diabetes mellitus without complication, without long-term current use of insulin ([place]) 06/19/2011   GAD (generalized anxiety disorder) 11/01/2009    Alvera Singh, PT 12/23/2021, 11:22 AM  [facility] @ [address] [place], [place], [address] Phone: [phone]   Fax:  [phone]  Name: Krista Austin MRN: [id number] Date of Birth: 05/31/1945

## 2021-12-24 ENCOUNTER — Other Ambulatory Visit: Payer: Self-pay | Admitting: Student

## 2021-12-24 DIAGNOSIS — M542 Cervicalgia: Secondary | ICD-10-CM

## 2022-01-02 ENCOUNTER — Emergency Department (HOSPITAL_COMMUNITY)
Admission: EM | Admit: 2022-01-02 | Discharge: 2022-01-02 | Discharge status: Against Medical Advice | Payer: Medicare Other | Attending: Physician Assistant | Admitting: Physician Assistant

## 2022-01-02 ENCOUNTER — Emergency Department (HOSPITAL_COMMUNITY): Payer: Medicare Other

## 2022-01-02 ENCOUNTER — Other Ambulatory Visit: Payer: Self-pay

## 2022-01-02 DIAGNOSIS — M542 Cervicalgia: Secondary | ICD-10-CM | POA: Diagnosis not present

## 2022-01-02 DIAGNOSIS — R202 Paresthesia of skin: Secondary | ICD-10-CM | POA: Insufficient documentation

## 2022-01-02 DIAGNOSIS — R200 Anesthesia of skin: Secondary | ICD-10-CM | POA: Diagnosis not present

## 2022-01-02 DIAGNOSIS — Z5321 Procedure and treatment not carried out due to patient leaving prior to being seen by health care provider: Secondary | ICD-10-CM | POA: Insufficient documentation

## 2022-01-02 LAB — COMPREHENSIVE METABOLIC PANEL
ALT: 29 U/L (ref 0–44)
AST: 25 U/L (ref 15–41)
Albumin: 4.1 g/dL (ref 3.5–5.0)
Alkaline Phosphatase: 87 U/L (ref 38–126)
Anion gap: 11 (ref 5–15)
BUN: 27 mg/dL — ABNORMAL HIGH (ref 8–23)
CO2: 22 mmol/L (ref 22–32)
Calcium: 9.6 mg/dL (ref 8.9–10.3)
Chloride: 105 mmol/L (ref 98–111)
Creatinine, Ser: 1.19 mg/dL — ABNORMAL HIGH (ref 0.44–1.00)
GFR, Estimated: 48 mL/min — ABNORMAL LOW (ref >60)
Glucose, Bld: 115 mg/dL — ABNORMAL HIGH (ref 70–99)
Potassium: 4.5 mmol/L (ref 3.5–5.1)
Sodium: 138 mmol/L (ref 135–145)
Total Bilirubin: 0.4 mg/dL (ref 0.3–1.2)
Total Protein: 6.8 g/dL (ref 6.5–8.1)

## 2022-01-02 LAB — CBC WITH DIFFERENTIAL/PLATELET
Abs Immature Granulocytes: 0.02 10*3/uL (ref 0.00–0.07)
Basophils Absolute: 0.1 10*3/uL (ref 0.0–0.1)
Basophils Relative: 1 %
Eosinophils Absolute: 0.2 10*3/uL (ref 0.0–0.5)
Eosinophils Relative: 3 %
HCT: 43.9 % (ref 36.0–46.0)
Hemoglobin: 15.3 g/dL — ABNORMAL HIGH (ref 12.0–15.0)
Immature Granulocytes: 0 %
Lymphocytes Relative: 35 %
Lymphs Abs: 2.6 10*3/uL (ref 0.7–4.0)
MCH: 31 pg (ref 26.0–34.0)
MCHC: 34.9 g/dL (ref 30.0–36.0)
MCV: 88.9 fL (ref 80.0–100.0)
Monocytes Absolute: 0.7 10*3/uL (ref 0.1–1.0)
Monocytes Relative: 10 %
Neutro Abs: 3.9 10*3/uL (ref 1.7–7.7)
Neutrophils Relative %: 51 %
Platelets: 216 10*3/uL (ref 150–400)
RBC: 4.94 MIL/uL (ref 3.87–5.11)
RDW: 13.2 % (ref 11.5–15.5)
WBC: 7.5 10*3/uL (ref 4.0–10.5)
nRBC: 0 % (ref 0.0–0.2)

## 2022-01-02 LAB — TROPONIN I (HIGH SENSITIVITY): Troponin I (High Sensitivity): 3 ng/L (ref <18)

## 2022-01-02 NOTE — ED Provider Triage Note (Signed)
Emergency Medicine Provider Triage Evaluation Note  Krista Austin , a 76 y.o. female  was evaluated in triage.  Pt complains of numbness and tingling ongoing chronic pain. She has a hx of cervical fusion, feels like maybe exacerbated. She feels that this is different, pain radiating from the right arm when turning neck. Prior MRI is going for CT of cervical spine Friday. Feels brain fog.   Review of Systems  Positive: Right arm numbness, facial numbness Negative: Fever, cp, con  Physical Exam  There were no vitals taken for this visit. Gen:   Awake, no distress   Resp:  Normal effort  MSK:   Moves extremities without difficulty  Other:    Medical Decision Making  Medically screening exam initiated at 1:33 PM.  Appropriate orders placed.  KATALINA MAGRI was informed that the remainder of the evaluation will be completed by another provider, this initial triage assessment does not replace that evaluation, and the importance of remaining in the ED until their evaluation is complete.     Janeece Fitting, PA-C 01/02/22 1338

## 2022-01-02 NOTE — ED Notes (Signed)
Pt no longer wanted to wait pt left hospital. 

## 2022-01-02 NOTE — ED Triage Notes (Signed)
Pt from home-hx of degenerative disc disease and chronic back pain. Has been seen multiple times for same but today numbness associated with same is worse and not movement dependent and is traveling up into her face. Also noticed jaw and arm pain while in [employer] today, which has now resolved.

## 2022-01-08 ENCOUNTER — Ambulatory Visit: Payer: Medicare Other | Attending: Physician Assistant | Admitting: Physical Therapy

## 2022-01-08 DIAGNOSIS — M6281 Muscle weakness (generalized): Secondary | ICD-10-CM | POA: Diagnosis present

## 2022-01-08 DIAGNOSIS — M5136 Other intervertebral disc degeneration, lumbar region: Secondary | ICD-10-CM | POA: Diagnosis present

## 2022-01-08 DIAGNOSIS — M542 Cervicalgia: Secondary | ICD-10-CM | POA: Diagnosis not present

## 2022-01-08 NOTE — Therapy (Signed)
[facility] @ [address] [place], [place], [address] Phone: [phone]   Fax:  [phone]  Physical Therapy Treatment/Recertification  Patient Details  Name: Krista Austin MRN: [id number] Date of Birth: 02/25/1946 Referring Provider (PT): [name]   Encounter Date: 01/08/2022   PT End of Session - 01/08/22 1356     Visit Number 25    Date for PT Re-Evaluation 04/02/22    Authorization Type medicare A/B 30th visit prog note    Progress Note Due on Visit 30    PT Start Time 1400    PT Stop Time 1440    PT Time Calculation (min) 40 min    Activity Tolerance Patient tolerated treatment well             Past Medical History:  Diagnosis Date   A-fib ([place])    Adenomatous polyp    Arthritis    "mostly fingers" (10/07/2016)   At high risk for falls    Celiac disease    "I do not have the disease; 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" (10/07/2016)   Chronic back pain    "worse in my neck; lower back also affected" (10/07/2016)   Chronic cholecystitis 40/97/3532   Complication of anesthesia    "never fully regained my taste after my cervical fusion; I don't take much RX so I'm very sensitive to any sedation" (10/07/2016)   Concussion    X2   DDD (degenerative disc disease), cervical    lumbar   Depression    "hx; take Prozac to keep me stable" (10/07/2016)   Dizziness    caused by compression of cervical disc   Family history of adverse reaction to anesthesia    "daughters get PONV; one daughter breaks out severely; grandson got suspended in a state of anxiousness after kidney surgery; had to be held for hours"   GERD (gastroesophageal reflux disease)    Hyperlipidemia    OSA on CPAP    Pneumonia    "once as an adult" (10/07/2016)   Thyroid nodule    Type II diabetes mellitus ([place])    controlled by diet and exercide    Past Surgical History:  Procedure Laterality Date   ANTERIOR CERVICAL DECOMP/DISCECTOMY FUSION   2015   BACK SURGERY     CARPAL TUNNEL RELEASE Bilateral    CATARACT EXTRACTION, BILATERAL Bilateral 09/10/2014 - 09/24/2014   by [place] N/A 10/16/2016   Procedure: LAPAROSCOPIC CHOLECYSTECTOMY;  Surgeon: [name], MD;  Location: [facility];  Service: General;  Laterality: N/A;   LAPAROSCOPIC CHOLECYSTECTOMY  10/16/2016   LUMBAR [place] SURGERY  2007   TONSILLECTOMY  05/05/1983   TUBAL LIGATION  02/09/1979    There were no vitals filed for this visit.   Subjective Assessment - 01/08/22 1356     Subjective It's been quite a week.  I need you to work on my midback.  I have brain fog and overwhelming fatigue.  Went to ED with facial numbness.  Ruled out stroke and heart attack.  EKG showed possible old MI.  Will see a cardiologist.  Blood work showed kidney function not good.  Sleeping OK but worsens as the day goes on.  Started the traction 8# 2-3x/day.  Having CT scan tomorrow and NCV in October.  Will see a rheumatologist.  PT helps me a lot, especially the DN.   I wouldn't be  functional without PT.    Pertinent History DM, stage III  kidney; osteoporosis (mild)    How long can you walk comfortably? 15-20 minutes (initially only 1/2 mile)  50 laps .8 mile    Diagnostic tests MRI 02/04/21 C5-6 solid arthrodesis and no impingement; mild to moderat narrowing left C3-4 and right C 4-5, thoracic multilevel degenerative changes;  MRI on Aug 11: Right C4-5 foraminal stenosis;  Left C4-5 impinge C5 nerve root; Mod stenosis C3-4; C6-7 broad disc protrusion    Currently in Pain? Yes    Pain Location Thoracic    Pain Orientation Mid    Pain Type Chronic pain    Pain Score 4    Pain Location Neck    Pain Orientation Mid    Pain Type Chronic pain                [facility] PT Assessment - 01/08/22 0001       Assessment   Medical Diagnosis cervicalgia; low back pain    Referring Provider (PT) [name]    Hand Dominance Right      Observation/Other Assessments   Neck  Disability Index  44%      AROM   Cervical Flexion 25    Cervical Extension 40    Cervical - Right Side Bend 15    Cervical - Left Side Bend 20    Cervical - Right Rotation 40    Cervical - Left Rotation 30    Lumbar Flexion 70    Lumbar Extension 15    Lumbar - Right Side Bend 30    Lumbar - Left Side Bend 30      Strength   Overall Strength Comments cervical and periscapular strength 4/5; lumbo/pelvic/hip strength grossly 4+/5    Right Hand Grip (lbs) 22    Left Hand Grip (lbs) 12                           [facility] Adult PT Treatment/Exercise - 01/08/22 0001       Self-Care   Other Self-Care Comments  discussion of recent new symptoms      Neck Exercises: Seated   Other Seated Exercise discussed cervical and lumbar ROM measurements and strength measures compared to earlier objective measures      Manual Therapy   Manual therapy comments soft tissue mob to bil cervical, thoracic and scapular musculature bil    Soft tissue mobilization bil cervical multifidi, bil upper traps, cervical paraspinals, suboccipitals; thoracic paraspinals              Trigger Point Dry Needling - 01/08/22 0001     Consent Given? Yes    Other Dry Needling bil mid thoracic multifidi    Upper Trapezius Response Twitch reponse elicited;Palpable increased muscle length    Suboccipitals Response Twitch response elicited;Palpable increased muscle length    Cervical multifidi Response Twitch reponse elicited;Palpable increased muscle length   thoracic paraspinals mid                    PT Short Term Goals - 05/13/21 1935       PT SHORT TERM GOAL #1   Title Pt will be independent with her initial HEP to decrease pain/modulate symptoms    Status Achieved      PT SHORT TERM GOAL #2   Title Pt will report at least 30% decrease in neck pain and UE pain/numbness.    Status Achieved      PT SHORT TERM GOAL #3  Title Nexk Disability Index improved to 38%    Status  Achieved      PT SHORT TERM GOAL #4   Title Cervical flexion improved to 40 degrees and extension to 50 degrees needed for grooming/dressing tasks    Status Partially Met               PT Long Term Goals - 01/08/22 1549       PT LONG TERM GOAL #1   Title The patient will be independent in self management techniques and strategies needed to take care of herself and her son    Time 12    Period Weeks    Status On-going    Target Date 04/02/22      PT LONG TERM GOAL #2   Title Pt will report at least 65% improvement in neck and UE paresthesia with daily activity or typically 3/10 with ADLs like writing, peeling fruits/vegetables and opening jars    Time 12    Period Weeks    Status On-going      PT LONG TERM GOAL #3   Title Improved cervical sidebending ROM improved to 40 degrees and rotation to 45 degrees needed for driving    Time 12    Period Weeks    Status Partially Met      PT LONG TERM GOAL #4   Title Neck Disability Index improved to 32% indicating improved function with less pain    Time 12    Period Weeks    Status On-going      PT LONG TERM GOAL #5   Title Cervical and periscapular strength improved to 4+/5 needed for lifting/ carrying bags    Time 12    Period Weeks    Status Partially Met                   Plan - 01/08/22 1447     Clinical Impression Statement The patient has had new symptoms of brain fog and transient facial numbness.  Went to the ED where CVA and MI ruled out.  She has several upcoming appts with a neurologist, rheumatologist and cardiologist to help sort out potential causes.  The patient has long term chronic pain in cervical, thoracic and lumbar regions with known degenerative multi level changes.  She has been instructed in home TENS use, basic ex's and traction use for self care.  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  Without regular PT she would have a decline in function and mobility.  Although large changes in progress are not expected she would benefit from continued PT to provide pain relieving interventions.    Personal Factors and Comorbidities Age;Comorbidity 2;Time since onset of injury/illness/exacerbation;Comorbidity 1    Comorbidities DM; CKD; carpal tunnel    Examination-Activity Limitations Caring for Others;Carry;Lift;Bend;Squat    Rehab Potential Good    PT Frequency 1x / week    PT Duration 12 weeks    PT Treatment/Interventions ADLs/Self Care Home Management;Biofeedback;Cryotherapy;Electrical Stimulation;Moist Heat;Traction;Therapeutic activities;Therapeutic exercise;Neuromuscular re-education;Patient/family education;Manual techniques;Passive range of motion;Dry needling;Taping;Aquatic Therapy;Ultrasound    PT Next Visit Plan cervical mobs PA;   DN/ES combo to neck/thoracic; DN glutes, piriformis, cervical musculature as needed; U/S; ex; patient education    PT Home Exercise Plan Access Code: [facility]             Patient will benefit  from skilled therapeutic intervention in order to improve the following deficits and impairments:  Pain, Increased fascial restricitons, Decreased strength, Decreased activity tolerance, Decreased range of motion, Increased muscle spasms, Decreased endurance, Impaired UE functional use  Visit Diagnosis: Cervicalgia - Plan: PT plan of care cert/re-cert  Muscle weakness (generalized) - Plan: PT plan of care cert/re-cert  Lumbar degenerative disc disease - Plan: PT plan of care cert/re-cert     Problem List Patient Active Problem List   Diagnosis Date Noted   Closed fracture of 9th & 10th ribs of right side 12/17/2016   Pneumothorax on right 12/17/2016   Lumbar degenerative disc disease 12/17/2016   Essential hypertension 12/17/2016   Dyslipidemia 10/07/2016   Pulmonary nodule 10/07/2016   Abnormal CXR    Chest  pain 10/06/2016   S/P lumbar spinal fusion 08/05/2016   Obstructive sleep apnea syndrome, mild 10/27/2013   Controlled type 2 diabetes mellitus without complication, without long-term current use of insulin ([place]) 06/19/2011   GAD (generalized anxiety disorder) 11/01/2009    Alvera Singh, PT 01/08/2022, 4:06 PM  [facility] @ [address] [place], [place], [address] Phone: [phone]   Fax:  [phone]  Name: Krista Austin MRN: [id number] Date of Birth: December 05, 1945

## 2022-01-09 ENCOUNTER — Ambulatory Visit
Admission: RE | Admit: 2022-01-09 | Discharge: 2022-01-09 | Disposition: A | Discharge status: Home / Self Care | Payer: Medicare Other | Source: Ambulatory Visit | Attending: Student | Admitting: Student

## 2022-01-09 DIAGNOSIS — M542 Cervicalgia: Secondary | ICD-10-CM

## 2022-01-13 ENCOUNTER — Telehealth: Payer: Self-pay

## 2022-01-13 NOTE — Telephone Encounter (Signed)
NOTES SCANNED TO REFERRAL 

## 2022-02-05 ENCOUNTER — Ambulatory Visit: Payer: Medicare Other | Attending: Physician Assistant | Admitting: Physical Therapy

## 2022-02-05 DIAGNOSIS — M5136 Other intervertebral disc degeneration, lumbar region: Secondary | ICD-10-CM | POA: Diagnosis not present

## 2022-02-05 DIAGNOSIS — M542 Cervicalgia: Secondary | ICD-10-CM | POA: Insufficient documentation

## 2022-02-05 DIAGNOSIS — M6281 Muscle weakness (generalized): Secondary | ICD-10-CM | POA: Insufficient documentation

## 2022-02-05 NOTE — Therapy (Signed)
[facility] @ [place], [place], [address] Phone: [phone]   Fax:  [phone]  Physical Therapy Treatment  Patient Details  Name: Krista Austin MRN: [id number] Date of Birth: 08-Aug-1945 Referring Provider (PT): [name]   Encounter Date: 02/05/2022   PT End of Session - 02/05/22 1159     Date for PT Re-Evaluation 04/02/22    Authorization Type medicare A/B 30th visit prog note    Progress Note Due on Visit 74    PT Start Time 1100    PT Stop Time 1140   DN   PT Time Calculation (min) 40 min    Activity Tolerance Patient tolerated treatment well             Past Medical History:  Diagnosis Date   A-fib ([place])    Adenomatous polyp    Arthritis    "mostly fingers" (10/07/2016)   At high risk for falls    Celiac disease    "I do not have the disease; 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" (10/07/2016)   Chronic back pain    "worse in my neck; lower back also affected" (10/07/2016)   Chronic cholecystitis 43/32/9518   Complication of anesthesia    "never fully regained my taste after my cervical fusion; I don't take much RX so I'm very sensitive to any sedation" (10/07/2016)   Concussion    X2   DDD (degenerative disc disease), cervical    lumbar   Depression    "hx; take Prozac to keep me stable" (10/07/2016)   Dizziness    caused by compression of cervical disc   Family history of adverse reaction to anesthesia    "daughters get PONV; one daughter breaks out severely; grandson got suspended in a state of anxiousness after kidney surgery; had to be held for hours"   GERD (gastroesophageal reflux disease)    Hyperlipidemia    OSA on CPAP    Pneumonia    "once as an adult" (10/07/2016)   Thyroid nodule    Type II diabetes mellitus ([place])    controlled by diet and exercide    Past Surgical History:  Procedure Laterality Date   ANTERIOR CERVICAL DECOMP/DISCECTOMY FUSION  2015   BACK SURGERY     CARPAL  TUNNEL RELEASE Bilateral    CATARACT EXTRACTION, BILATERAL Bilateral 09/10/2014 - 09/24/2014   by [place] N/A 10/16/2016   Procedure: LAPAROSCOPIC CHOLECYSTECTOMY;  Surgeon: [name], MD;  Location: [facility];  Service: General;  Laterality: N/A;   LAPAROSCOPIC CHOLECYSTECTOMY  10/16/2016   LUMBAR [place] SURGERY  2007   TONSILLECTOMY  05/05/1983   TUBAL LIGATION  02/09/1979    There were no vitals filed for this visit.   Subjective Assessment - 02/05/22 1102     Subjective I'm feeling better. Just little aches and pains.  Even the tingling is better.  Left glute pain from lifting suitcases.   Walking daily.   NCV Oct 23rd.  Neurology appt to address the vertigo.    Currently in Pain? Yes    Pain Score 3     Pain Location Hip;  bil neck, mid back/bra line   Pain Orientation Left hip; bil neck                              [facility] Adult PT Treatment/Exercise - 02/05/22 0001       Manual Therapy  Manual therapy comments soft tissue mob to bil cervical, thoracic and scapular musculature bil    Joint Mobilization T4-L5 PA joint mobs grade 2/3 3x 20 sec each level    Soft tissue mobilization bil cervical multifidi, bil upper traps, cervical paraspinals, suboccipitals; thoracic paraspinals, gluteals    Myofascial Release suboccipital release              Trigger Point Dry Needling - 02/05/22 0001     Consent Given? Yes    Other Dry Needling bil mid thoracic multifidi    Upper Trapezius Response Twitch reponse elicited;Palpable increased muscle length    Suboccipitals Response Twitch response elicited;Palpable increased muscle length    Cervical multifidi Response Twitch reponse elicited;Palpable increased muscle length   thoracic paraspinals mid   Gluteus Minimus Response Twitch response elicited;Palpable increased muscle length    Gluteus Medius Response Twitch response elicited;Palpable increased muscle length    Gluteus Maximus  Response Twitch response elicited;Palpable increased muscle length                        PT Long Term Goals - 01/08/22 1549       PT LONG TERM GOAL #1   Title The patient will be independent in self management techniques and strategies needed to take care of herself and her son    Time 21    Period Weeks    Status On-going    Target Date 04/02/22      PT LONG TERM GOAL #2   Title Pt will report at least 65% improvement in neck and UE paresthesia with daily activity or typically 3/10 with ADLs like writing, peeling fruits/vegetables and opening jars    Time 12    Period Weeks    Status On-going      PT LONG TERM GOAL #3   Title Improved cervical sidebending ROM improved to 40 degrees and rotation to 45 degrees needed for driving    Time 12    Period Weeks    Status Partially Met      PT LONG TERM GOAL #4   Title Neck Disability Index improved to 32% indicating improved function with less pain    Time 12    Period Weeks    Status On-going      PT LONG TERM GOAL #5   Title Cervical and periscapular strength improved to 4+/5 needed for lifting/ carrying bags    Time 12    Period Weeks    Status Partially Met                   Plan - 02/05/22 1150     Clinical Impression Statement The patient reports her symptoms are much better managed since her last visit even after traveling for a week.  Overall decreased tender point size and number in lumbo/pelvic/hip region as well as cervical/thoracic regions.  Good response to joint mobs in this region for futher pain reduction.  Therapist monitoring response throughout session.    Personal Factors and Comorbidities Age;Comorbidity 2;Time since onset of injury/illness/exacerbation;Comorbidity 1    Comorbidities DM; CKD; carpal tunnel    Examination-Activity Limitations Caring for Others;Carry;Lift;Bend;Squat    Examination-Participation Restrictions Cleaning;Community Activity;Meal Prep    Rehab Potential Good     PT Frequency 1x / week    PT Duration 12 weeks    PT Treatment/Interventions ADLs/Self Care Home Management;Biofeedback;Cryotherapy;Electrical Stimulation;Moist Heat;Traction;Therapeutic activities;Therapeutic exercise;Neuromuscular re-education;Patient/family education;Manual techniques;Passive range of motion;Dry needling;Taping;Aquatic Therapy;Ultrasound  PT Next Visit Plan cervical mobs PA;   DN/ES combo to neck/thoracic; DN glutes, piriformis, cervical musculature as needed; U/S; ex; patient education    PT Home Exercise Plan Access Code: [facility]             Patient will benefit from skilled therapeutic intervention in order to improve the following deficits and impairments:  Pain, Increased fascial restricitons, Decreased strength, Decreased activity tolerance, Decreased range of motion, Increased muscle spasms, Decreased endurance, Impaired UE functional use  Visit Diagnosis: Cervicalgia  Muscle weakness (generalized)  Lumbar degenerative disc disease     Problem List Patient Active Problem List   Diagnosis Date Noted   Closed fracture of 9th & 10th ribs of right side 12/17/2016   Pneumothorax on right 12/17/2016   Lumbar degenerative disc disease 12/17/2016   Essential hypertension 12/17/2016   Dyslipidemia 10/07/2016   Pulmonary nodule 10/07/2016   Abnormal CXR    Chest pain 10/06/2016   S/P lumbar spinal fusion 08/05/2016   Obstructive sleep apnea syndrome, mild 10/27/2013   Controlled type 2 diabetes mellitus without complication, without long-term current use of insulin ([place]) 06/19/2011   GAD (generalized anxiety disorder) 11/01/2009    Alvera Singh, PT 02/05/2022, 12:00 PM  [facility] @ [place], [place], [address] Phone: [phone]   Fax:  [phone]  Name: Krista Austin MRN: [id number] Date of Birth: [birth year]

## 2022-02-11 ENCOUNTER — Ambulatory Visit: Payer: Medicare Other | Admitting: Physical Therapy

## 2022-02-12 ENCOUNTER — Ambulatory Visit: Payer: Medicare Other | Admitting: Cardiology

## 2022-02-19 ENCOUNTER — Ambulatory Visit: Payer: Medicare Other | Admitting: Physical Therapy

## 2022-02-23 ENCOUNTER — Ambulatory Visit: Payer: Medicare Other | Admitting: Neurology

## 2022-02-26 ENCOUNTER — Ambulatory Visit: Payer: Medicare Other | Admitting: Physical Therapy

## 2022-02-26 DIAGNOSIS — M542 Cervicalgia: Secondary | ICD-10-CM

## 2022-02-26 DIAGNOSIS — M6281 Muscle weakness (generalized): Secondary | ICD-10-CM

## 2022-02-26 NOTE — Therapy (Signed)
[facility] @ [place], [place], [address] Phone: [phone]   Fax:  [phone]  Physical Therapy Treatment  Patient Details  Name: Krista Austin MRN: [id number] Date of Birth: 1946/04/06 Referring Provider (PT): [name]   Encounter Date: 02/26/2022   PT End of Session - 02/26/22 1508     Visit Number 27    Date for PT Re-Evaluation 04/02/22    Authorization Type medicare A/B 30th visit prog note    Progress Note Due on Visit 27    PT Start Time 1147    PT Stop Time 1225   DN   PT Time Calculation (min) 38 min    Activity Tolerance Patient tolerated treatment well             Past Medical History:  Diagnosis Date   A-fib ([place])    Adenomatous polyp    Arthritis    "mostly fingers" (10/07/2016)   At high risk for falls    Celiac disease    "I do not have the disease; 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" (10/07/2016)   Chronic back pain    "worse in my neck; lower back also affected" (10/07/2016)   Chronic cholecystitis 77/82/4235   Complication of anesthesia    "never fully regained my taste after my cervical fusion; I don't take much RX so I'm very sensitive to any sedation" (10/07/2016)   Concussion    X2   DDD (degenerative disc disease), cervical    lumbar   Depression    "hx; take Prozac to keep me stable" (10/07/2016)   Dizziness    caused by compression of cervical disc   Family history of adverse reaction to anesthesia    "daughters get PONV; one daughter breaks out severely; grandson got suspended in a state of anxiousness after kidney surgery; had to be held for hours"   GERD (gastroesophageal reflux disease)    Hyperlipidemia    OSA on CPAP    Pneumonia    "once as an adult" (10/07/2016)   Thyroid nodule    Type II diabetes mellitus ([place])    controlled by diet and exercide    Past Surgical History:  Procedure Laterality Date   ANTERIOR CERVICAL DECOMP/DISCECTOMY FUSION  2015   BACK  SURGERY     CARPAL TUNNEL RELEASE Bilateral    CATARACT EXTRACTION, BILATERAL Bilateral 09/10/2014 - 09/24/2014   by [place] N/A 10/16/2016   Procedure: LAPAROSCOPIC CHOLECYSTECTOMY;  Surgeon: [name], MD;  Location: [facility];  Service: General;  Laterality: N/A;   LAPAROSCOPIC CHOLECYSTECTOMY  10/16/2016   LUMBAR [place] SURGERY  2007   TONSILLECTOMY  05/05/1983   TUBAL LIGATION  02/09/1979    There were no vitals filed for this visit.   Subjective Assessment - 02/26/22 1150     Subjective Had NCV on Monday no results yet.  Been doing well but right side of neck.    Pertinent History DM, stage III kidney; osteoporosis (mild)    How long can you walk comfortably? 15-20 minutes (initially only 1/2 mile)  50 laps .8 mile    Diagnostic tests MRI 02/04/21 C5-6 solid arthrodesis and no impingement; mild to moderat narrowing left C3-4 and right C 4-5, thoracic multilevel degenerative changes;  MRI on Aug 11: Right C4-5 foraminal stenosis;  Left C4-5 impinge C5 nerve root; Mod stenosis C3-4; C6-7 broad disc protrusion    Currently in Pain? Yes    Pain Score  2     Pain Location Neck    Pain Orientation Right    Pain Type Chronic pain                               [facility] Adult PT Treatment/Exercise - 02/26/22 0001       Manual Therapy   Manual therapy comments soft tissue mob to bil cervical, thoracic and  musculature bil    Joint Mobilization C7-T2 PA jt mobs grade 2/3 3x 20 sec each level    Soft tissue mobilization bil cervical multifidi, bil upper traps, cervical paraspinals, suboccipitals;    Myofascial Release suboccipital release 3 min x2    Manual Traction 30x 15 sec holds              Trigger Point Dry Needling - 02/26/22 0001     Consent Given? Yes    Upper Trapezius Response Twitch reponse elicited;Palpable increased muscle length    Suboccipitals Response Twitch response elicited;Palpable increased muscle length    Cervical  multifidi Response Twitch reponse elicited;Palpable increased muscle length   thoracic paraspinals mid                       PT Long Term Goals - 02/26/22 1510       PT LONG TERM GOAL #1   Title The patient will be independent in self management techniques and strategies needed to take care of herself and her son    Time 12    Period Weeks    Status On-going    Target Date 04/02/22      PT LONG TERM GOAL #2   Title Pt will report at least 65% improvement in neck and UE paresthesia with daily activity or typically 3/10 with ADLs like writing, peeling fruits/vegetables and opening jars    Time 12    Period Weeks    Status On-going      PT LONG TERM GOAL #3   Title Improved cervical sidebending ROM improved to 40 degrees and rotation to 45 degrees needed for driving    Time 12    Period Weeks    Status Partially Met                   Plan - 02/26/22 1502     Clinical Impression Statement The patient benefits significantly from DN and manual therapy to alleviate pain and improve soft tissue mobility for cervical ROM.  Decreased myofascial tender point size and number in cervcial area compared to prior visits.    She has been able to manage the pain in lumbar/hip area fairly well recently and does not require treatment to this area today.    Personal Factors and Comorbidities Age;Comorbidity 2;Time since onset of injury/illness/exacerbation;Comorbidity 1    Comorbidities DM; CKD; carpal tunnel    Examination-Activity Limitations Caring for Others;Carry;Lift;Bend;Squat    Examination-Participation Restrictions Cleaning;Community Activity;Meal Prep    Rehab Potential Good    PT Frequency 1x / week    PT Duration 12 weeks    PT Treatment/Interventions ADLs/Self Care Home Management;Biofeedback;Cryotherapy;Electrical Stimulation;Moist Heat;Traction;Therapeutic activities;Therapeutic exercise;Neuromuscular re-education;Patient/family education;Manual  techniques;Passive range of motion;Dry needling;Taping;Aquatic Therapy;Ultrasound    PT Next Visit Plan cervical mobs PA;   DN/ES combo to neck/thoracic; DN glutes, piriformis, cervical musculature as needed; U/S; ex; patient education;  NDI    PT Home Exercise Plan Access Code: [facility]  Patient will benefit from skilled therapeutic intervention in order to improve the following deficits and impairments:  Pain, Increased fascial restricitons, Decreased strength, Decreased activity tolerance, Decreased range of motion, Increased muscle spasms, Decreased endurance, Impaired UE functional use  Visit Diagnosis: Cervicalgia  Muscle weakness (generalized)     Problem List Patient Active Problem List   Diagnosis Date Noted   Closed fracture of 9th & 10th ribs of right side 12/17/2016   Pneumothorax on right 12/17/2016   Lumbar degenerative disc disease 12/17/2016   Essential hypertension 12/17/2016   Dyslipidemia 10/07/2016   Pulmonary nodule 10/07/2016   Abnormal CXR    Chest pain 10/06/2016   S/P lumbar spinal fusion 08/05/2016   Obstructive sleep apnea syndrome, mild 10/27/2013   Controlled type 2 diabetes mellitus without complication, without long-term current use of insulin ([place]) 06/19/2011   GAD (generalized anxiety disorder) 11/01/2009    Alvera Singh, PT 02/26/2022, 3:11 PM  [facility] @ [place], [place], [address] Phone: [phone]   Fax:  [phone]  Name: Krista Austin MRN: [id number] Date of Birth: Mar 19, 1946

## 2022-03-04 ENCOUNTER — Encounter: Payer: Medicare Other | Admitting: Physical Therapy

## 2022-03-12 ENCOUNTER — Ambulatory Visit: Payer: Medicare Other | Attending: Physician Assistant | Admitting: Physical Therapy

## 2022-03-12 ENCOUNTER — Ambulatory Visit: Payer: Medicare Other | Admitting: Neurology

## 2022-03-12 DIAGNOSIS — M5136 Other intervertebral disc degeneration, lumbar region: Secondary | ICD-10-CM | POA: Insufficient documentation

## 2022-03-12 DIAGNOSIS — M542 Cervicalgia: Secondary | ICD-10-CM | POA: Insufficient documentation

## 2022-03-12 DIAGNOSIS — M6281 Muscle weakness (generalized): Secondary | ICD-10-CM | POA: Insufficient documentation

## 2022-03-13 ENCOUNTER — Ambulatory Visit: Payer: Medicare Other | Admitting: Physical Therapy

## 2022-03-13 ENCOUNTER — Encounter: Payer: Self-pay | Admitting: Physical Therapy

## 2022-03-13 DIAGNOSIS — M5136 Other intervertebral disc degeneration, lumbar region: Secondary | ICD-10-CM | POA: Diagnosis present

## 2022-03-13 DIAGNOSIS — M542 Cervicalgia: Secondary | ICD-10-CM

## 2022-03-13 DIAGNOSIS — M6281 Muscle weakness (generalized): Secondary | ICD-10-CM

## 2022-03-13 NOTE — Therapy (Signed)
OUTPATIENT PHYSICAL THERAPY TREATMENT NOTE   Patient Name: Krista Austin MRN: [id number] DOB:[birth year], 76 y.o., female Today's Date: 03/13/2022  PCP: [name] PA REFERRING PROVIDER: [name] PA   PT End of Session - 03/13/22 1014     Visit Number 28    Date for PT Re-Evaluation 04/02/22    Authorization Type medicare A/B 30th visit prog note    Progress Note Due on Visit 11    PT Start Time 1015    PT Stop Time 1100    PT Time Calculation (min) 45 min    Activity Tolerance Patient tolerated treatment well             Past Medical History:  Diagnosis Date   A-fib ([place])    Adenomatous polyp    Arthritis    "mostly fingers" (10/07/2016)   At high risk for falls    Celiac disease    "I do not have the disease; 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" (10/07/2016)   Chronic back pain    "worse in my neck; lower back also affected" (10/07/2016)   Chronic cholecystitis 95/11/2255   Complication of anesthesia    "never fully regained my taste after my cervical fusion; I don't take much RX so I'm very sensitive to any sedation" (10/07/2016)   Concussion    X2   DDD (degenerative disc disease), cervical    lumbar   Depression    "hx; take Prozac to keep me stable" (10/07/2016)   Dizziness    caused by compression of cervical disc   Family history of adverse reaction to anesthesia    "daughters get PONV; one daughter breaks out severely; grandson got suspended in a state of anxiousness after kidney surgery; had to be held for hours"   GERD (gastroesophageal reflux disease)    Hyperlipidemia    OSA on CPAP    Pneumonia    "once as an adult" (10/07/2016)   Thyroid nodule    Type II diabetes mellitus ([place])    controlled by diet and exercide   Past Surgical History:  Procedure Laterality Date   ANTERIOR CERVICAL DECOMP/DISCECTOMY FUSION  2015   BACK SURGERY     CARPAL TUNNEL RELEASE Bilateral    CATARACT EXTRACTION, BILATERAL Bilateral 09/10/2014 - 09/24/2014   by  [facility]   CHOLECYSTECTOMY N/A 10/16/2016   Procedure: LAPAROSCOPIC CHOLECYSTECTOMY;  Surgeon: [name], MD;  Location: [facility];  Service: General;  Laterality: N/A;   LAPAROSCOPIC CHOLECYSTECTOMY  10/16/2016   LUMBAR [place] SURGERY  2007   TONSILLECTOMY  05/05/1983   TUBAL LIGATION  02/09/1979   Patient Active Problem List   Diagnosis Date Noted   Closed fracture of 9th & 10th ribs of right side 12/17/2016   Pneumothorax on right 12/17/2016   Lumbar degenerative disc disease 12/17/2016   Essential hypertension 12/17/2016   Dyslipidemia 10/07/2016   Pulmonary nodule 10/07/2016   Abnormal CXR    Chest pain 10/06/2016   S/P lumbar spinal fusion 08/05/2016   Obstructive sleep apnea syndrome, mild 10/27/2013   Controlled type 2 diabetes mellitus without complication, without long-term current use of insulin ([place]) 06/19/2011   GAD (generalized anxiety disorder) 11/01/2009    REFERRING DIAG: neck pain; back pain  THERAPY DIAG:  Cervicalgia  Muscle weakness (generalized)  Lumbar degenerative disc disease  Rationale for Evaluation and Treatment Rehabilitation  PERTINENT HISTORY: spinal fusion 2018; bil carpal tunnel surgery; DM  PRECAUTIONS: none  SUBJECTIVE:   SUBJECTIVE STATEMENT: NCV showed bil carpal tunnel  syndrome, referred to Dr. Amedeo Plenty;  I need some work on my right hip today and my neck too.    PAIN:  PAIN:  Are you having pain? Yes NPRS scale: 4/10 Pain location: neck and back/posterior hip    TODAY'S TREATMENT:                                                                                                                                         DATE: 11/10 Manual therapy: soft tissue mobilization to gluteals, piriformis, lumbar paraspinals, cervical paraspinals, upper traps, suboccipitals Trigger Point Dry-Needling  Treatment instructions: Expect mild to moderate muscle soreness. S/S of pneumothorax if dry needled over a lung field, and to seek immediate  medical attention should they occur. Patient verbalized understanding of these instructions and education.  Patient Consent Given: Yes Education handout provided: Previously provided Muscles treated: right gluteals, bil lumbar multifid, bil upper traps, bil cervical multifidi, bil suboccipitals Electrical stimulation performed: Yes Parameters:  80 pps 49mn to bil lumbar multifidi Treatment response/outcome: improved pain and soft tissue mobility PATIENT EDUCATION: Education details: plan of care Person educated: Patient Education method: Explanation Education comprehension: verbalized understanding  HOME EXERCISE PROGRAM: HEP for cervical ROM, neural mobility and lumbar/hip mobility   PT Short Term Goals - 03/13/22 1238       PT SHORT TERM GOAL #1   Title Pt will be independent with her initial HEP to decrease pain/modulate symptoms    Time 6    Status Achieved      PT SHORT TERM GOAL #2   Title Pt will report at least 30% decrease in neck pain and UE pain/numbness.    Status Achieved      PT SHORT TERM GOAL #3   Title [place] Disability Index improved to 38%    Status Achieved      PT SHORT TERM GOAL #4   Title Cervical flexion improved to 40 degrees and extension to 50 degrees needed for grooming/dressing tasks    Status Partially Met              PT Long Term Goals - 03/13/22 1238       PT LONG TERM GOAL #1   Title The patient will be independent in self management techniques and strategies needed to take care of herself and her son    Time 12    Period Weeks    Status On-going    Target Date 04/02/22      PT LONG TERM GOAL #2   Title Pt will report at least 65% improvement in neck and UE paresthesia with daily activity or typically 3/10 with ADLs like writing, peeling fruits/vegetables and opening jars    Time 12    Period Weeks    Status On-going      PT LONG TERM GOAL #3   Title Improved cervical sidebending ROM improved to 40 degrees and  rotation to 45  degrees needed for driving    Time 12    Period Weeks    Status Partially Met      PT LONG TERM GOAL #4   Title Neck Disability Index improved to 32% indicating improved function with less pain    Time 12    Period Weeks    Status On-going      PT LONG TERM GOAL #5   Title Cervical and periscapular strength improved to 4+/5 needed for lifting/ carrying bags    Time 12    Period Weeks    Status Partially Met      PT LONG TERM GOAL #6   Title LBP and right peripheral symptoms improved by 65% or typically 3/10 with ADLs    Time 12    Period Weeks    Status Achieved      PT LONG TERM GOAL #7   Title Improve grip strength to 22# on right, left 18# needed for peeling fruits/vegetables as the cook of the household    Time 12    Period Weeks    Status On-going              Plan - 03/13/22 1239     Clinical Impression Statement The patient reports DN and manual therapy helps to manage her chronic back and neck pain and allows her to maintain her mobility and function.  Much improved soft tissue mobility and reduced tender point size and number.  Therapist modifying treatment and interventions based on symptoms.    Comorbidities DM; CKD; carpal tunnel    Examination-Activity Limitations Caring for Others;Carry;Lift;Bend;Squat    Examination-Participation Restrictions Cleaning;Community Activity;Meal Prep    Stability/Clinical Decision Making Stable/Uncomplicated    Rehab Potential Good    PT Frequency 1x / week    PT Duration 12 weeks    PT Treatment/Interventions ADLs/Self Care Home Management;Biofeedback;Cryotherapy;Electrical Stimulation;Moist Heat;Traction;Therapeutic activities;Therapeutic exercise;Neuromuscular re-education;Patient/family education;Manual techniques;Passive range of motion;Dry needling;Taping;Aquatic Therapy;Ultrasound    PT Next Visit Plan KX;  NDI and ERO next visit;  cervical and lumbar ROM;  cervical mobs PA;   DN/ES combo to neck/thoracic; DN glutes,  piriformis, cervical musculature as needed; U/S; ex; patient education    PT Home Exercise Plan Access Code: [place], PT 03/13/22 1:03 PM Phone: [phone] Fax: [phone]   Alvera Singh, PT 03/13/2022, 1:03 PM

## 2022-03-17 ENCOUNTER — Encounter: Payer: Self-pay | Admitting: Cardiology

## 2022-03-17 ENCOUNTER — Ambulatory Visit: Payer: Medicare Other | Attending: Cardiology | Admitting: Cardiology

## 2022-03-17 VITALS — BP 100/60 | HR 74 | Ht 65.0 in | Wt 151.0 lb

## 2022-03-17 DIAGNOSIS — R0609 Other forms of dyspnea: Secondary | ICD-10-CM | POA: Insufficient documentation

## 2022-03-17 DIAGNOSIS — M79605 Pain in left leg: Secondary | ICD-10-CM | POA: Insufficient documentation

## 2022-03-17 DIAGNOSIS — M79604 Pain in right leg: Secondary | ICD-10-CM | POA: Diagnosis not present

## 2022-03-17 DIAGNOSIS — I25118 Atherosclerotic heart disease of native coronary artery with other forms of angina pectoris: Secondary | ICD-10-CM | POA: Insufficient documentation

## 2022-03-17 NOTE — Progress Notes (Signed)
Cardiology Office Note:    Date:  03/17/2022   ID:  [name], DOB June 10, 1945, MRN [id number]  PCP:  Barrie Lyme   [facility] Providers Cardiologist:  Candee Furbish, MD     Referring MD: Doyle Askew, PA-C   History of Present Illness:    Krista Austin is a 76 y.o. female here for the evaluation of an abnormal EKG at the request of [name], PA-C  She has a history of cervical disc degeneration, diabetes mellitus type 2, mixed hyperlipidemia, and coronary artery stenosis, and COPD.   She went to the ED complaining of neck pain and vertigo. Her EKG was abnormal and A1C was mildly elevated. Her kidney function was elevated, brain fog, excessive fatigue, and numbness/tingling in the right side of face. Stroke and heart was ruled out at the ED.   During a follow up with with [name], PA-C on 01/12/2022 she complained of worsening chronic right arm pain/neuropathy and vertigo. The pain severity was noted as mild to moderate. She denied any falls or injuries. She reported her vertigo  getting worse and more frequent with brain fog which prompted her recent ED visit.   She previously had a CT scan with no new imaging. She had a nerve condition study scheduled on 10/9 and f/u with neuro on 10/12. She continues to follow up with neurosurgeon and PT. Her daughter had concerns for her baance issues and dizziness.   She had been taking her medication as instructed. She felt as though her sugars were better controlled with Ozempic but insurance would no longer cover it.   She was encouraged to try stress relief and to continue medications. She was also given samples of ozempic.   She has signifcant family history of heart disease  Today, she presents with concerns of signs with dementia such as vertigo, compression, dizziness, slowing of thought processing, trouble finding her words, and brain fog. She reported that her late mother's autopsy revealed plaque  and vascular dementia. She also noted that her son has signs of early onset Alzheimer's disease. Sister diagnosed with RBBB. The maternal side of her family has extensive history of high cholesterol.   She reports that using Ozempic has significantly improved her diabetes and hypoglycemic symptoms. She notes that she feels much better and her levels are better controlled. She brought her recent lab work with her which showed LDL of 45 and HDL of 44.  Prior to her quitting smoking in 2018, she had been smoking for about 50 years.   She experiences pain in her LE (thighs) and shortness of breath when walking up and down the stairs.   She denies any palpitations, chest pain, or peripheral edema. No headaches, syncope, orthopnea, or PND.  Past Medical History:  Diagnosis Date   A-fib ([place])    Adenomatous polyp    Arthritis    "mostly fingers" (10/07/2016)   At high risk for falls    Celiac disease    "I do not have the disease; 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" (10/07/2016)   Chronic back pain    "worse in my neck; lower back also affected" (10/07/2016)   Chronic cholecystitis 00/86/7619   Complication of anesthesia    "never fully regained my taste after my cervical fusion; I don't take much RX so I'm very sensitive to any sedation" (10/07/2016)   Concussion    X2   DDD (degenerative disc disease), cervical    lumbar   Depression    "  hx; take Prozac to keep me stable" (10/07/2016)   Dizziness    caused by compression of cervical disc   Family history of adverse reaction to anesthesia    "daughters get PONV; one daughter breaks out severely; grandson got suspended in a state of anxiousness after kidney surgery; had to be held for hours"   GERD (gastroesophageal reflux disease)    Hyperlipidemia    OSA on CPAP    Pneumonia    "once as an adult" (10/07/2016)   Thyroid nodule    Type II diabetes mellitus ([place])    controlled by diet and exercide    Past Surgical History:  Procedure Laterality  Date   ANTERIOR CERVICAL DECOMP/DISCECTOMY FUSION  2015   BACK SURGERY     CARPAL TUNNEL RELEASE Bilateral    CATARACT EXTRACTION, BILATERAL Bilateral 09/10/2014 - 09/24/2014   by [place] N/A 10/16/2016   Procedure: LAPAROSCOPIC CHOLECYSTECTOMY;  Surgeon: [name], MD;  Location: [facility];  Service: General;  Laterality: N/A;   LAPAROSCOPIC CHOLECYSTECTOMY  10/16/2016   LUMBAR [place] SURGERY  2007   TONSILLECTOMY  05/05/1983   TUBAL LIGATION  02/09/1979    Current Medications: Current Meds  Medication Sig   acetaminophen (TYLENOL) 500 MG tablet Take 1 tablet (500 mg total) by mouth every 4 (four) hours.   aspirin EC 81 MG tablet Take 81 mg by mouth at bedtime.    cetirizine (ZYRTEC) 10 MG tablet Take 10 mg by mouth at bedtime.   FLUoxetine (PROZAC) 40 MG capsule Take 40 mg by mouth at bedtime.    gabapentin (NEURONTIN) 100 MG capsule Take 100 mg by mouth 3 (three) times daily as needed.   hydrocortisone cream 1 % Apply 1 application topically daily as needed for itching.   lisinopril (PRINIVIL,ZESTRIL) 10 MG tablet Take 10 mg by mouth daily.   methocarbamol (ROBAXIN) 500 MG tablet Take 500 mg by mouth 4 (four) times daily.   Multiple Vitamins-Minerals (MULTIVITAMIN WITH MINERALS) tablet Take 1 tablet by mouth at bedtime.    Omega-3 Fatty Acids (FISH OIL PO) Take 1 capsule by mouth daily.    pantoprazole (PROTONIX) 40 MG tablet Take 40 mg by mouth daily.   rosuvastatin (CRESTOR) 10 MG tablet Take 10 mg by mouth at bedtime. Reported on 09/18/2015   Semaglutide,0.25 or 0.'5MG'$ /DOS, (OZEMPIC, 0.25 OR 0.5 MG/DOSE,) 2 MG/1.5ML SOPN Inject 0.25 mg into the muscle once a week.     Allergies:   Lactose intolerance (gi)   Social History   Socioeconomic History   Marital status: Divorced    Spouse name: Not on file   Number of children: Not on file   Years of education: Not on file   Highest education level: Not on file  Occupational History   Not on file  Tobacco  Use   Smoking status: Former    Packs/day: 1.00    Years: 50.00    Total pack years: 50.00    Types: Cigarettes    Quit date: 10/16/2016    Years since quitting: 5.4   Smokeless tobacco: Never   Tobacco comments:    10/16/2016 "weaned down the past 9 days; on RX now; done!!!!!!!!"  Vaping Use   Vaping Use: Never used  Substance and Sexual Activity   Alcohol use: No   Drug use: No   Sexual activity: Not Currently  Other Topics Concern   Not on file  Social History Narrative   Not on file   Social Determinants  of Health   Financial Resource Strain: Not on file  Food Insecurity: Not on file  Transportation Needs: Not on file  Physical Activity: Not on file  Stress: Not on file  Social Connections: Not on file     Family History: The patient's family history includes Arthritis in her maternal grandmother, mother, and sister; Atrial fibrillation in her father; Breast cancer in her maternal grandmother; COPD in her father; Dementia in her maternal grandmother; Diabetes type I in her maternal grandfather; Fibromyalgia in her sister; Hyperlipidemia in her mother and sister; Hypertension in her maternal grandmother; Hypothyroidism in her mother, sister, and sister; Macular degeneration in her mother; Mitral valve prolapse in her mother; Osteoarthritis in her sister; Other in an other family member.  ROS:   Please see the history of present illness.    (+) Shortness of breath (with exertion) (+) Vertigo (+) Dizziness (+) Brain Fog (trouble finding words and slow thought processing) (+) Neck pain / compression (+) LE pain (when walking up stairs) All other systems reviewed and are negative.  EKGs/Labs/Other Studies Reviewed:    The following studies were reviewed today:   Bilateral LE Venous Duplex 10/07/2016 Summary:  - No evidence of deep vein thrombosis involving the right lower    extremity and left lower extremity.  - No evidence of Baker&'s cyst on the right or left.    Echo 10/07/2016 Study Conclusions  - Left ventricle: The cavity size was normal. Wall thickness was    normal. Systolic function was normal. The estimated ejection    fraction was in the range of 60% to 65%. Wall motion was normal;    there were no regional wall motion abnormalities. Doppler    parameters are consistent with abnormal left ventricular    relaxation (grade 1 diastolic dysfunction).  - Aortic valve: There was no stenosis.  - Mitral valve: There was trivial regurgitation.  - Right ventricle: The cavity size was normal. Systolic function    was normal.  - Pulmonary arteries: No complete TR doppler jet so unable to    estimate PA systolic pressure.  - Inferior vena cava: The vessel was normal in size. The    respirophasic diameter changes were in the normal range (>= 50%),    consistent with normal central venous pressure.   Impressions:  - Normal LV size with EF 60-65%. Normal RV size and systolic    function. No significant valvular abnormalities.    EKG:  EKG is personally reviewed and interpreted. 03/17/2022: EKG was not ordered. 01/06/2022: Normal sinus rhythm. Rate 78 bpm. Possible anterior infarct. Abnormal EKG.  Recent Labs: 01/02/2022: ALT 29; BUN 27; Creatinine, Ser 1.19; Hemoglobin 15.3; Platelets 216; Potassium 4.5; Sodium 138   Recent Lipid Panel    Component Value Date/Time   CHOL 111 10/07/2016 0808   TRIG 113 10/07/2016 0808   HDL 33 (L) 10/07/2016 0808   CHOLHDL 3.4 10/07/2016 0808   VLDL 23 10/07/2016 0808   LDLCALC 55 10/07/2016 0808     Risk Assessment/Calculations:          Physical Exam:    VS:  BP 100/60 (BP Location: Left Arm, Patient Position: Sitting, Cuff Size: Normal)   Pulse 74   Ht '5\' 5"'$  (1.651 m)   Wt 151 lb (68.5 kg)   SpO2 96%   BMI 25.13 kg/m     Wt Readings from Last 3 Encounters:  03/17/22 151 lb (68.5 kg)  11/28/20 155 lb (70.3 kg)  11/16/20 155 lb (  70.3 kg)     GEN: Well nourished, well developed in no acute  distress HEENT: Normal NECK: No JVD; No carotid bruits LYMPHATICS: No lymphadenopathy CARDIAC:  RRR, no murmurs, rubs, gallops RESPIRATORY:  Clear to auscultation without rales, wheezing or rhonchi  ABDOMEN: Soft, non-tender, non-distended MUSCULOSKELETAL:  No edema; No deformity  SKIN: Warm and dry NEUROLOGIC:  Alert and oriented x 3 PSYCHIATRIC:  Normal affect   ASSESSMENT:    1. Coronary artery disease of native artery of native heart with stable angina pectoris ([place])   2. DOE (dyspnea on exertion)   3. Bilateral leg pain    PLAN:    In order of problems listed above:  Multivessel coronary artery disease - Multivessel coronary artery disease/calcified plaque quite diffuse seen on CT scan personally reviewed and interpreted.  This includes the LAD RCA and circumflex artery. - She has been experiencing some increasing shortness of breath when going up stairs as well as some leg discomfort with this.  This could possibly be her anginal equivalent.  Her brother had a "widow maker "lesion.  She has a strong family history of coronary artery disease. - I would like to go ahead and check an echocardiogram to ensure proper structure and function.  Her EKG was abnormal demonstrating a possible age-indeterminate infarct.  Echo will be helpful in elucidating this. - I would also like to check a cardiac PET stress.  This will be extremely beneficial given her multi vessel diffuse calcified plaque to see if there is any high-grade coronary blood flow issues present. - She is currently on aspirin 81 mg as well as Crestor 10 mg.  Excellent.  Her LDL most recently was in the 60s.  Fantastic.  I am comfortable with her staying with this Crestor 10 mg dose.  There was some concern about potential memory impairment recently.  Her mother had vascular dementia.  Her mother had an autopsy.  Thigh pain bilaterally - When traversing stairs, she has been experiencing some thigh pain.  She does have diffuse  calcified plaque in carotids as well as coronary arteries.  I will check lower extremity ABI arterial Dopplers to ensure that there are no high-grade lesions. -Continue with aspirin as well as Crestor and excellent blood pressure control.  Former smoker - 50 pack years.  CT scan reviewed as above.  Diffuse multivessel CAD.  Shared Decision Making/Informed Consent The risks [chest pain, shortness of breath, cardiac arrhythmias, dizziness, blood pressure fluctuations, myocardial infarction, stroke/transient ischemic attack, nausea, vomiting, allergic reaction, radiation exposure, metallic taste sensation and life-threatening complications (estimated to be 1 in 10,000)], benefits (risk stratification, diagnosing coronary artery disease, treatment guidance) and alternatives of a cardiac PET stress test were discussed in detail with Ms. Evelene Croon and she agrees to proceed.   Follow-up: 6 months.  Medication Adjustments/Labs and Tests Ordered: Current medicines are reviewed at length with the patient today.  Concerns regarding medicines are outlined above.   Orders Placed This Encounter  Procedures   NM PET CT CARDIAC PERFUSION MULTI W/ABSOLUTE BLOODFLOW   ECHOCARDIOGRAM COMPLETE   VAS [place] LOWER EXTREMITY ARTERIAL DUPLEX   VAS [place] ABI WITH/WO TBI   No orders of the defined types were placed in this encounter.  Patient Instructions  Medication Instructions:  Your physician recommends that you continue on your current medications as directed. Please refer to the Current Medication list given to you today.  *If you need a refill on your cardiac medications before your next appointment, please call  your pharmacy*   Lab Work: NONE If you have labs (blood work) drawn today and your tests are completely normal, you will receive your results only by: [facility] (if you have MyChart) OR A paper copy in the mail If you have any lab test that is abnormal or we need to change your treatment, we will  call you to review the results.   Testing/Procedures: Your physician has requested that you have an echocardiogram. Echocardiography is a painless test that uses sound waves to create images of your heart. It provides your doctor with information about the size and shape of your heart and how well your heart's chambers and valves are working. This procedure takes approximately one hour. There are no restrictions for this procedure. Please do NOT wear cologne, perfume, aftershave, or lotions (deodorant is allowed). Please arrive 15 minutes prior to your appointment time.   Your physician has requested that you have a Cardiac PET Scan.  Your physician has requested that you have an ankle brachial index (ABI). During this test an ultrasound and blood pressure cuff are used to evaluate the arteries that supply the arms and legs with blood. Allow thirty minutes for this exam. There are no restrictions or special instructions.  Your physician has requested that you have a lower extremity arterial duplex. This test is an ultrasound of the arteries in the legs. It looks at arterial blood flow in the legs.  There are no restrictions or special instructions    Follow-Up: At [facility], you and your health needs are our priority.  As part of our continuing mission to provide you with exceptional heart care, we have created designated Provider Care Teams.  These Care Teams include your primary Cardiologist (physician) and Advanced Practice Providers (APPs -  Physician Assistants and Nurse Practitioners) who all work together to provide you with the care you need, when you need it.     Your next appointment:   6 month(s)  The format for your next appointment:   In Person  Provider:   Candee Furbish, MD     Other Instructions How to Prepare for Your Cardiac PET/CT Stress Test:  1. Please do not take these medications before your test:   Medications that may interfere with the cardiac  pharmacological stress agent (ex. nitrates - including erectile dysfunction medications or beta-blockers) the day of the exam. (Erectile dysfunction medication should be held for at least 72 hrs prior to test) Theophylline containing medications for 12 hours. Dipyridamole 48 hours prior to the test. Your remaining medications may be taken with water.  2. Nothing to eat or drink, except water, 3 hours prior to arrival time.   NO caffeine/decaffeinated products, or chocolate 12 hours prior to arrival.  3. NO perfume, cologne or lotion  4. Total time is 1 to 2 hours; you may want to bring reading material for the waiting time.  5. Please report to Admitting at the [facility] Main Entrance 60 minutes early for your test.  [place], [place] [id number]  Diabetic Preparation:  Hold oral medications. You may take NPH and Lantus insulin. Do not take Humalog or Humulin R (Regular Insulin) the day of your test. Check blood sugars prior to leaving the house. If able to eat breakfast prior to 3 hour fasting, you may take all medications, including your insulin, Do not worry if you miss your breakfast dose of insulin - start at your next meal.  IF  YOU THINK YOU MAY BE PREGNANT, OR ARE NURSING PLEASE INFORM THE TECHNOLOGIST.  In preparation for your appointment, medication and supplies will be purchased.  Appointment availability is limited, so if you need to cancel or reschedule, please call the Radiology Department at [phone]  24 hours in advance to avoid a cancellation fee of $100.00  What to Expect After you Arrive:  Once you arrive and check in for your appointment, you will be taken to a preparation room within the Radiology Department.  A technologist or Nurse will obtain your medical history, verify that you are correctly prepped for the exam, and explain the procedure.  Afterwards,  an IV will be started in your arm and electrodes will be placed on your skin for  EKG monitoring during the stress portion of the exam. Then you will be escorted to the PET/CT scanner.  There, staff will get you positioned on the scanner and obtain a blood pressure and EKG.  During the exam, you will continue to be connected to the EKG and blood pressure machines.  A small, safe amount of a radioactive tracer will be injected in your IV to obtain a series of pictures of your heart along with an injection of a stress agent.    After your Exam:  It is recommended that you eat a meal and drink a caffeinated beverage to counter act any effects of the stress agent.  Drink plenty of fluids for the remainder of the day and urinate frequently for the first couple of hours after the exam.  Your doctor will inform you of your test results within 7-10 business days.  For questions about your test or how to prepare for your test, please call: Marchia Bond, Cardiac Imaging Nurse Navigator  Gordy Clement, Cardiac Imaging Nurse Navigator Office: [phone]   Important Information About Sugar         I,Rachel Rivera,acting as a scribe for Candee Furbish, MD.,have documented all relevant documentation on the behalf of Candee Furbish, MD,as directed by  Candee Furbish, MD while in the presence of Candee Furbish, MD.  I, Candee Furbish, MD, have reviewed all documentation for this visit. The documentation on 03/17/22 for the exam, diagnosis, procedures, and orders are all accurate and complete.   Signed, Candee Furbish, MD  03/17/2022 11:06 AM    [place]

## 2022-03-17 NOTE — Patient Instructions (Signed)
Medication Instructions:  Your physician recommends that you continue on your current medications as directed. Please refer to the Current Medication list given to you today.  *If you need a refill on your cardiac medications before your next appointment, please call your pharmacy*   Lab Work: NONE If you have labs (blood work) drawn today and your tests are completely normal, you will receive your results only by: [place] (if you have MyChart) OR A paper copy in the mail If you have any lab test that is abnormal or we need to change your treatment, we will call you to review the results.   Testing/Procedures: Your physician has requested that you have an echocardiogram. Echocardiography is a painless test that uses sound waves to create images of your heart. It provides your doctor with information about the size and shape of your heart and how well your heart's chambers and valves are working. This procedure takes approximately one hour. There are no restrictions for this procedure. Please do NOT wear cologne, perfume, aftershave, or lotions (deodorant is allowed). Please arrive 15 minutes prior to your appointment time.   Your physician has requested that you have a Cardiac PET Scan.  Your physician has requested that you have an ankle brachial index (ABI). During this test an ultrasound and blood pressure cuff are used to evaluate the arteries that supply the arms and legs with blood. Allow thirty minutes for this exam. There are no restrictions or special instructions.  Your physician has requested that you have a lower extremity arterial duplex. This test is an ultrasound of the arteries in the legs. It looks at arterial blood flow in the legs.  There are no restrictions or special instructions    Follow-Up: At [facility], you and your health needs are our priority.  As part of our continuing mission to provide you with exceptional heart care, we have created  designated Provider Care Teams.  These Care Teams include your primary Cardiologist (physician) and Advanced Practice Providers (APPs -  Physician Assistants and Nurse Practitioners) who all work together to provide you with the care you need, when you need it.     Your next appointment:   6 month(s)  The format for your next appointment:   In Person  Provider:   Candee Furbish, MD     Other Instructions How to Prepare for Your Cardiac PET/CT Stress Test:  1. Please do not take these medications before your test:   Medications that may interfere with the cardiac pharmacological stress agent (ex. nitrates - including erectile dysfunction medications or beta-blockers) the day of the exam. (Erectile dysfunction medication should be held for at least 72 hrs prior to test) Theophylline containing medications for 12 hours. Dipyridamole 48 hours prior to the test. Your remaining medications may be taken with water.  2. Nothing to eat or drink, except water, 3 hours prior to arrival time.   NO caffeine/decaffeinated products, or chocolate 12 hours prior to arrival.  3. NO perfume, cologne or lotion  4. Total time is 1 to 2 hours; you may want to bring reading material for the waiting time.  5. Please report to Admitting at the [facility] Main Entrance 60 minutes early for your test.  [place], [place] [id number]  Diabetic Preparation:  Hold oral medications. You may take NPH and Lantus insulin. Do not take Humalog or Humulin R (Regular Insulin) the day of your test. Check blood sugars prior  to leaving the house. If able to eat breakfast prior to 3 hour fasting, you may take all medications, including your insulin, Do not worry if you miss your breakfast dose of insulin - start at your next meal.  IF YOU THINK YOU MAY BE PREGNANT, OR ARE NURSING PLEASE INFORM THE TECHNOLOGIST.  In preparation for your appointment, medication and supplies will be purchased.   Appointment availability is limited, so if you need to cancel or reschedule, please call the Radiology Department at [phone]  24 hours in advance to avoid a cancellation fee of $100.00  What to Expect After you Arrive:  Once you arrive and check in for your appointment, you will be taken to a preparation room within the Radiology Department.  A technologist or Nurse will obtain your medical history, verify that you are correctly prepped for the exam, and explain the procedure.  Afterwards,  an IV will be started in your arm and electrodes will be placed on your skin for EKG monitoring during the stress portion of the exam. Then you will be escorted to the PET/CT scanner.  There, staff will get you positioned on the scanner and obtain a blood pressure and EKG.  During the exam, you will continue to be connected to the EKG and blood pressure machines.  A small, safe amount of a radioactive tracer will be injected in your IV to obtain a series of pictures of your heart along with an injection of a stress agent.    After your Exam:  It is recommended that you eat a meal and drink a caffeinated beverage to counter act any effects of the stress agent.  Drink plenty of fluids for the remainder of the day and urinate frequently for the first couple of hours after the exam.  Your doctor will inform you of your test results within 7-10 business days.  For questions about your test or how to prepare for your test, please call: Marchia Bond, Cardiac Imaging Nurse Navigator  Gordy Clement, Cardiac Imaging Nurse Navigator Office: [phone]   Important Information About Sugar

## 2022-03-18 ENCOUNTER — Encounter: Payer: Medicare Other | Admitting: Physical Therapy

## 2022-04-01 ENCOUNTER — Ambulatory Visit: Payer: Medicare Other | Admitting: Physical Therapy

## 2022-04-01 DIAGNOSIS — M5136 Other intervertebral disc degeneration, lumbar region: Secondary | ICD-10-CM

## 2022-04-01 DIAGNOSIS — M542 Cervicalgia: Secondary | ICD-10-CM | POA: Diagnosis not present

## 2022-04-01 DIAGNOSIS — M6281 Muscle weakness (generalized): Secondary | ICD-10-CM

## 2022-04-01 NOTE — Therapy (Signed)
OUTPATIENT PHYSICAL THERAPY TREATMENT NOTE/RECERTIFICATION   Patient Name: Krista Austin MRN: [id number] DOB:[birth year], 76 y.o., female Today's Date: 04/01/2022  PCP: [name] PA REFERRING PROVIDER: [name] PA   PT End of Session - 04/01/22 1053     Visit Number 32    Date for PT Re-Evaluation 06/24/22    Authorization Type medicare A/B 30th visit prog note    Progress Note Due on Visit 36    PT Start Time 1100    PT Stop Time 1140    PT Time Calculation (min) 40 min    Activity Tolerance Patient tolerated treatment well             Past Medical History:  Diagnosis Date   A-fib ([place])    Adenomatous polyp    Arthritis    "mostly fingers" (10/07/2016)   At high risk for falls    Celiac disease    "I do not have the disease; 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" (10/07/2016)   Chronic back pain    "worse in my neck; lower back also affected" (10/07/2016)   Chronic cholecystitis 76/28/3151   Complication of anesthesia    "never fully regained my taste after my cervical fusion; I don't take much RX so I'm very sensitive to any sedation" (10/07/2016)   Concussion    X2   DDD (degenerative disc disease), cervical    lumbar   Depression    "hx; take Prozac to keep me stable" (10/07/2016)   Dizziness    caused by compression of cervical disc   Family history of adverse reaction to anesthesia    "daughters get PONV; one daughter breaks out severely; grandson got suspended in a state of anxiousness after kidney surgery; had to be held for hours"   GERD (gastroesophageal reflux disease)    Hyperlipidemia    OSA on CPAP    Pneumonia    "once as an adult" (10/07/2016)   Thyroid nodule    Type II diabetes mellitus ([place])    controlled by diet and exercide   Past Surgical History:  Procedure Laterality Date   ANTERIOR CERVICAL DECOMP/DISCECTOMY FUSION  2015   BACK SURGERY     CARPAL TUNNEL RELEASE Bilateral    CATARACT EXTRACTION, BILATERAL Bilateral 09/10/2014 -  09/24/2014   by [place] N/A 10/16/2016   Procedure: LAPAROSCOPIC CHOLECYSTECTOMY;  Surgeon: [name], MD;  Location: [place] [facility] OR;  Service: General;  Laterality: N/A;   LAPAROSCOPIC CHOLECYSTECTOMY  10/16/2016   LUMBAR [place] SURGERY  2007   TONSILLECTOMY  05/05/1983   TUBAL LIGATION  02/09/1979   Patient Active Problem List   Diagnosis Date Noted   Closed fracture of 9th & 10th ribs of right side 12/17/2016   Pneumothorax on right 12/17/2016   Lumbar degenerative disc disease 12/17/2016   Essential hypertension 12/17/2016   Dyslipidemia 10/07/2016   Pulmonary nodule 10/07/2016   Abnormal CXR    Chest pain 10/06/2016   S/P lumbar spinal fusion 08/05/2016   Obstructive sleep apnea syndrome, mild 10/27/2013   Controlled type 2 diabetes mellitus without complication, without long-term current use of insulin ([place]) 06/19/2011   GAD (generalized anxiety disorder) 11/01/2009    REFERRING DIAG: neck pain; back pain  THERAPY DIAG: back pain Cervicalgia  Muscle weakness (generalized)  Lumbar degenerative disc disease  Rationale for Evaluation and Treatment Rehabilitation  PERTINENT HISTORY: spinal fusion 2018; bil carpal tunnel surgery; DM  PRECAUTIONS: none  SUBJECTIVE:   SUBJECTIVE STATEMENT:  Sees Dr. Amedeo Plenty  in January for worsening gripping, handwriting, dropping things.  I think there's something in my neck too so I'm going to talk to Dr. Saintclair Halsted about that.    PAIN:  PAIN:  Are you having pain? Yes NPRS scale: 1-2/10 Pain location: neck and back/posterior hip  CERVICAL ROM:   Active ROM A/PROM (deg) 11/29  Flexion 35  Extension 35  Right lateral flexion 18  Left lateral flexion 26  Right rotation 30  Left rotation 36   Grip strength:    (Blank rows = not tested)  LUMBAR ROM:   Active  A/PROM  11/29  Flexion 45  Extension 10  Right lateral flexion 15  Left lateral flexion 15  Right rotation   Left rotation    (Blank rows = not  tested)   Neck Disability Index:  40% moderate self perceived disability  Modified Oswestry:   36% moderate self perceived disability    TODAY'S TREATMENT:       DATE: 04/01/22 Cervical ROM Lumbar ROM Neck Disability Index Modified Oswestry Manual therapy: soft tissue mobilization lumbar paraspinals, cervical paraspinals, upper traps, suboccipitals; grade 1/2 thoracic PA mob Trigger Point Dry-Needling  Treatment instructions: Expect mild to moderate muscle soreness. S/S of pneumothorax if dry needled over a lung field, and to seek immediate medical attention should they occur. Patient verbalized understanding of these instructions and education.  Patient Consent Given: Yes Education handout provided: Previously provided Muscles treated: right gluteals, bil lumbar multifid, bil upper traps, bil cervical multifidi, bil suboccipitals Electrical stimulation performed: Yes Parameters: 80 pps 39mn to bil lumbar multifidi Treatment response/outcome: improved pain and soft tissue mobility                                                                                                                                        PATIENT EDUCATION: Education details: plan of care Person educated: Patient Education method: Explanation Education comprehension: verbalized understanding  HOME EXERCISE PROGRAM: HEP for cervical ROM, neural mobility and lumbar/hip mobility        PT Long Term Goals - 03/13/22 1238       PT LONG TERM GOAL #1   Title The patient will be independent in self management techniques and strategies needed to take care of herself and her son    Time 12    Period Weeks    Status On-going    Target Date 06/24/22      PT LONG TERM GOAL #2   Title Pt will report at least 60% improvement in neck symptoms with light to medium lifting and vacumning   Time 12    Period Weeks    Status On-going      PT LONG TERM GOAL #3   Title Improved cervical  rotation ROM to 40  degrees needed for driving    Time 12    Period Weeks    Status Partially Met  PT LONG TERM GOAL #4   Title Neck Disability Index improved to 32% indicating improved function with less pain    Time 12    Period Weeks    Status On-going      PT LONG TERM GOAL #5   Title Cervical and periscapular strength improved to 4+/5 needed for lifting/ carrying bags    Time 12    Period Weeks    Status Partially Met      PT LONG TERM GOAL #6   Title LBP and right peripheral symptoms improved by 65% or typically 3/10 with ADLs    Time 12    Period Weeks    Status Achieved      PT LONG TERM GOAL #7   Title Modified Oswestry score improved to 30% indicating improved function with less pain   Time 12    Period Weeks    Status On-going              Plan - 03/13/22 1239     Clinical Impression Statement  The patient would benefit from a continuation of skilled PT.    Although progress is limited, the patient's condition demonstrates that skilled care is necessary for the performance of a safe and effective program to maintain current condition and prevent deterioration.  Without PT the patient would have a decline in function.        Comorbidities DM; CKD; carpal tunnel    Examination-Activity Limitations Caring for Others;Carry;Lift;Bend;Squat    Examination-Participation Restrictions Cleaning;Community Activity;Meal Prep    Stability/Clinical Decision Making Stable/Uncomplicated    Rehab Potential Good    PT Frequency 1x / week    PT Duration 12 weeks    PT Treatment/Interventions ADLs/Self Care Home Management;Biofeedback;Cryotherapy;Electrical Stimulation;Moist Heat;Traction;Therapeutic activities;Therapeutic exercise;Neuromuscular re-education;Patient/family education;Manual techniques;Passive range of motion;Dry needling;Taping;Aquatic Therapy;Ultrasound    PT Next Visit Plan 30th visit progress note; KX;  past recert rerouted for signature and spoke with [place] who will call  the doctor's office for signature;  cervical and lumbar ROM;  cervical mobs PA;   DN/ES combo to neck/thoracic; DN glutes, piriformis, cervical musculature as needed; U/S; ex; patient education;  sees Dr. Amedeo Plenty in January   PT Home Exercise Plan Access Code: [place], PT 04/01/22 1:00 PM Phone: [phone] Fax: [phone]

## 2022-04-13 ENCOUNTER — Ambulatory Visit (HOSPITAL_COMMUNITY)
Admission: RE | Admit: 2022-04-13 | Discharge: 2022-04-13 | Disposition: A | Discharge status: Home / Self Care | Payer: Medicare Other | Source: Ambulatory Visit | Attending: Cardiology | Admitting: Cardiology

## 2022-04-13 DIAGNOSIS — I25118 Atherosclerotic heart disease of native coronary artery with other forms of angina pectoris: Secondary | ICD-10-CM | POA: Diagnosis not present

## 2022-04-13 DIAGNOSIS — M79605 Pain in left leg: Secondary | ICD-10-CM | POA: Diagnosis present

## 2022-04-13 DIAGNOSIS — M79604 Pain in right leg: Secondary | ICD-10-CM | POA: Diagnosis present

## 2022-04-16 ENCOUNTER — Ambulatory Visit (HOSPITAL_COMMUNITY): Payer: Medicare Other | Attending: Cardiology

## 2022-04-16 DIAGNOSIS — R0609 Other forms of dyspnea: Secondary | ICD-10-CM | POA: Diagnosis present

## 2022-04-16 DIAGNOSIS — I25118 Atherosclerotic heart disease of native coronary artery with other forms of angina pectoris: Secondary | ICD-10-CM

## 2022-04-17 LAB — ECHOCARDIOGRAM COMPLETE
Area-P 1/2: 2.91 cm2
S' Lateral: 2.8 cm

## 2022-04-22 ENCOUNTER — Ambulatory Visit: Payer: Medicare Other | Attending: Physician Assistant | Admitting: Physical Therapy

## 2022-04-22 DIAGNOSIS — M542 Cervicalgia: Secondary | ICD-10-CM | POA: Diagnosis present

## 2022-04-22 DIAGNOSIS — M5136 Other intervertebral disc degeneration, lumbar region: Secondary | ICD-10-CM | POA: Diagnosis present

## 2022-04-22 DIAGNOSIS — M6281 Muscle weakness (generalized): Secondary | ICD-10-CM | POA: Diagnosis present

## 2022-04-22 NOTE — Therapy (Signed)
OUTPATIENT PHYSICAL THERAPY TREATMENT NOTE  Patient Name: Krista Austin MRN: [id number] DOB:May 31, 1945, 76 y.o., female Today's Date: 04/22/2022  PCP: [name] PA REFERRING PROVIDER: [name] PA   Progress Note Reporting Period 10/07/21 to 04/22/22  See note below for Objective Data and Assessment of Progress/Goals.      PT End of Session - 04/22/22 1105     Visit Number 30    Date for PT Re-Evaluation 06/24/22    Authorization Type medicare A/B 30th visit prog note    Progress Note Due on Visit 30    PT Start Time 1102    PT Stop Time 1144    PT Time Calculation (min) 42 min    Activity Tolerance Patient tolerated treatment well             Past Medical History:  Diagnosis Date   A-fib ([place])    Adenomatous polyp    Arthritis    "mostly fingers" (10/07/2016)   At high risk for falls    Celiac disease    "I do not have the disease; 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" (10/07/2016)   Chronic back pain    "worse in my neck; lower back also affected" (10/07/2016)   Chronic cholecystitis 09/62/8366   Complication of anesthesia    "never fully regained my taste after my cervical fusion; I don't take much RX so I'm very sensitive to any sedation" (10/07/2016)   Concussion    X2   DDD (degenerative disc disease), cervical    lumbar   Depression    "hx; take Prozac to keep me stable" (10/07/2016)   Dizziness    caused by compression of cervical disc   Family history of adverse reaction to anesthesia    "daughters get PONV; one daughter breaks out severely; grandson got suspended in a state of anxiousness after kidney surgery; had to be held for hours"   GERD (gastroesophageal reflux disease)    Hyperlipidemia    OSA on CPAP    Pneumonia    "once as an adult" (10/07/2016)   Thyroid nodule    Type II diabetes mellitus ([place])    controlled by diet and exercide   Past Surgical History:  Procedure Laterality Date   ANTERIOR CERVICAL DECOMP/DISCECTOMY FUSION  2015    BACK SURGERY     CARPAL TUNNEL RELEASE Bilateral    CATARACT EXTRACTION, BILATERAL Bilateral 09/10/2014 - 09/24/2014   by [place] N/A 10/16/2016   Procedure: LAPAROSCOPIC CHOLECYSTECTOMY;  Surgeon: [name], MD;  Location: [facility];  Service: General;  Laterality: N/A;   LAPAROSCOPIC CHOLECYSTECTOMY  10/16/2016   LUMBAR [place] SURGERY  2007   TONSILLECTOMY  05/05/1983   TUBAL LIGATION  02/09/1979   Patient Active Problem List   Diagnosis Date Noted   Closed fracture of 9th & 10th ribs of right side 12/17/2016   Pneumothorax on right 12/17/2016   Lumbar degenerative disc disease 12/17/2016   Essential hypertension 12/17/2016   Dyslipidemia 10/07/2016   Pulmonary nodule 10/07/2016   Abnormal CXR    Chest pain 10/06/2016   S/P lumbar spinal fusion 08/05/2016   Obstructive sleep apnea syndrome, mild 10/27/2013   Controlled type 2 diabetes mellitus without complication, without long-term current use of insulin ([place]) 06/19/2011   GAD (generalized anxiety disorder) 11/01/2009    REFERRING DIAG: neck pain; back pain  THERAPY DIAG: back pain Cervicalgia  Muscle weakness (generalized)  Lumbar degenerative disc disease  Rationale for Evaluation and Treatment Rehabilitation  PERTINENT  HISTORY: spinal fusion 2018; bil carpal tunnel surgery; DM  PRECAUTIONS: none  SUBJECTIVE:   SUBJECTIVE STATEMENT: Vascular scans ok.  Today it's mainly my hip.  Neck not too bad today.    Sees Dr. Amedeo Plenty in January  PAIN:  PAIN:  Are you having pain? Yes NPRS scale: 1-2/10 Pain location: upper hip, neck  CERVICAL ROM:   Active ROM A/PROM (deg) 11/29 12/20  Flexion 35 35  Extension 35 35  Right lateral flexion 18 20  Left lateral flexion 26 25  Right rotation 30 30  Left rotation 36 36   Grip strength:    (Blank rows = not tested)  LUMBAR ROM:   Active  A/PROM  11/29 12/20  Flexion 45 45  Extension 10 10  Right lateral flexion 15 15  Left lateral flexion  15 15  Right rotation    Left rotation     (Blank rows = not tested)   Neck Disability Index:  40% moderate self perceived disability  Modified Oswestry:   36% moderate self perceived disability    TODAY'S TREATMENT:       DATE: 04/22/22  Manual therapy: soft tissue mobilization gluteal, upper traps, suboccipitals;  grade 1/2 thoracic PA mob Sidelying neutral gapping grade 3 20 sec 3x Trigger Point Dry-Needling  Treatment instructions: Expect mild to moderate muscle soreness. S/S of pneumothorax if dry needled over a lung field, and to seek immediate medical attention should they occur. Patient verbalized understanding of these instructions and education.  Patient Consent Given: Yes Education handout provided: Previously provided Muscles treated: right gluteals, bil upper traps, bil cervical multifidi, bil suboccipitals Electrical stimulation performed: no Parameters: NA Treatment response/outcome: improved pain and soft tissue mobility    DATE: 04/01/22 Cervical ROM Lumbar ROM Neck Disability Index Modified Oswestry Manual therapy: soft tissue mobilization lumbar paraspinals, cervical paraspinals, upper traps, suboccipitals; grade 1/2 thoracic PA mob Trigger Point Dry-Needling  Treatment instructions: Expect mild to moderate muscle soreness. S/S of pneumothorax if dry needled over a lung field, and to seek immediate medical attention should they occur. Patient verbalized understanding of these instructions and education.  Patient Consent Given: Yes Education handout provided: Previously provided Muscles treated: right gluteals, bil lumbar multifid, bil upper traps, bil cervical multifidi, bil suboccipitals Electrical stimulation performed: Yes Parameters: 80 pps 48mn to bil lumbar multifidi Treatment response/outcome: improved pain and soft tissue mobility                                                                                                                                         PATIENT EDUCATION: Education details: plan of care Person educated: Patient Education method: Explanation Education comprehension: verbalized understanding  HOME EXERCISE PROGRAM: HEP for cervical ROM, neural mobility and lumbar/hip mobility        PT Long Term Goals - 03/13/22 1238       PT LONG TERM GOAL #1  Title The patient will be independent in self management techniques and strategies needed to take care of herself and her son    Time 51    Period Weeks    Status On-going    Target Date 06/24/22      PT LONG TERM GOAL #2   Title Pt will report at least 60% improvement in neck symptoms with light to medium lifting and vacumning   Time 12    Period Weeks    Status On-going      PT LONG TERM GOAL #3   Title Improved cervical  rotation ROM to 40 degrees needed for driving    Time 12    Period Weeks    Status Partially Met      PT LONG TERM GOAL #4   Title Neck Disability Index improved to 32% indicating improved function with less pain    Time 12    Period Weeks    Status On-going      PT LONG TERM GOAL #5   Title Cervical and periscapular strength improved to 4+/5 needed for lifting/ carrying bags    Time 12    Period Weeks    Status Partially Met      PT LONG TERM GOAL #6   Title LBP and right peripheral symptoms improved by 65% or typically 3/10 with ADLs    Time 12    Period Weeks    Status Achieved      PT LONG TERM GOAL #7   Title Modified Oswestry score improved to 30% indicating improved function with less pain   Time 12    Period Weeks    Status On-going              Plan - 03/13/22 1239     Clinical Impression Statement  The patient would benefit from a continuation of skilled PT for a further progression of strengthening and functional mobility. Without PT she would have a decline in quality of life and function.   Will continue to promote independence with self care and management.        Comorbidities DM;  CKD; carpal tunnel    Examination-Activity Limitations Caring for Others;Carry;Lift;Bend;Squat    Examination-Participation Restrictions Cleaning;Community Activity;Meal Prep    Stability/Clinical Decision Making Stable/Uncomplicated    Rehab Potential Good    PT Frequency 1x / week    PT Duration 12 weeks    PT Treatment/Interventions ADLs/Self Care Home Management;Biofeedback;Cryotherapy;Electrical Stimulation;Moist Heat;Traction;Therapeutic activities;Therapeutic exercise;Neuromuscular re-education;Patient/family education;Manual techniques;Passive range of motion;Dry needling;Taping;Aquatic Therapy;Ultrasound    PT Next Visit Plan  KX;  cervical and lumbar ROM;  cervical mobs PA;   DN/ES combo to neck/thoracic; DN glutes, piriformis, cervical musculature as needed; U/S; ex; patient education;  sees Dr. Amedeo Plenty in January   PT Home Exercise Plan Access Code: [place], PT 04/22/22 12:48 PM Phone: [phone] Fax: [phone]

## 2022-05-06 ENCOUNTER — Ambulatory Visit: Payer: Medicare Other | Admitting: Physical Therapy

## 2022-05-08 ENCOUNTER — Telehealth (HOSPITAL_COMMUNITY): Payer: Self-pay | Admitting: Emergency Medicine

## 2022-05-08 ENCOUNTER — Telehealth (HOSPITAL_COMMUNITY): Payer: Self-pay | Admitting: *Deleted

## 2022-05-08 NOTE — Telephone Encounter (Signed)
Attempted to call patient regarding upcoming cardiac PET appointment. Left message on voicemail with name and callback number Beatrice Sehgal RN Navigator Cardiac Imaging [facility] [phone] Office [phone] Cell  

## 2022-05-08 NOTE — Telephone Encounter (Signed)
error 

## 2022-05-08 NOTE — Telephone Encounter (Signed)
Error

## 2022-05-08 NOTE — Telephone Encounter (Signed)
Patient returning call regarding upcoming cardiac imaging study; pt verbalizes understanding of appt date/time, parking situation and where to check in, pre-test NPO status, and verified current allergies; name and call back number provided for further questions should they arise  Krista Clement RN Navigator Cardiac Imaging [place] [facility] [phone] office [phone] cell  Patient aware to avoid caffeine 12 hours prior to her cardiac PET scan.

## 2022-05-12 ENCOUNTER — Encounter (HOSPITAL_COMMUNITY)
Admission: RE | Admit: 2022-05-12 | Discharge: 2022-05-12 | Disposition: A | Discharge status: Home / Self Care | Payer: Medicare Other | Source: Ambulatory Visit | Attending: Cardiology | Admitting: Cardiology

## 2022-05-12 DIAGNOSIS — I25118 Atherosclerotic heart disease of native coronary artery with other forms of angina pectoris: Secondary | ICD-10-CM | POA: Insufficient documentation

## 2022-05-12 DIAGNOSIS — R0609 Other forms of dyspnea: Secondary | ICD-10-CM | POA: Insufficient documentation

## 2022-05-12 LAB — NM PET CT CARDIAC PERFUSION MULTI W/ABSOLUTE BLOODFLOW
LV dias vol: 65 mL (ref 46–106)
LV sys vol: 15 mL
MBFR: 3.74
Rest MBF: 0.62 ml/g/min
Rest Nuclear Isotope Dose: 17.8 mCi
ST Depression (mm): 0 mm
Stress MBF: 2.32 ml/g/min
Stress Nuclear Isotope Dose: 17.7 mCi
TID: 0.87

## 2022-05-12 MED ORDER — REGADENOSON 0.4 MG/5ML IV SOLN
INTRAVENOUS | Status: AC
  Filled 2022-05-12: qty 5

## 2022-05-12 MED ORDER — REGADENOSON 0.4 MG/5ML IV SOLN
0.4000 mg | Freq: Once | INTRAVENOUS | Status: AC
  Administered 2022-05-12: 0.4 mg via INTRAVENOUS

## 2022-05-12 MED ORDER — RUBIDIUM RB82 GENERATOR (RUBYFILL)
17.7000 | PACK | Freq: Once | INTRAVENOUS | Status: AC
  Administered 2022-05-12: 17.7 via INTRAVENOUS

## 2022-05-12 MED ORDER — RUBIDIUM RB82 GENERATOR (RUBYFILL)
17.8000 | PACK | Freq: Once | INTRAVENOUS | Status: AC
  Administered 2022-05-12: 17.8 via INTRAVENOUS

## 2022-05-12 NOTE — Progress Notes (Signed)
Cardiac PET scan complete and pt denies complaints. VS and orders assessed and pt drinking Cola and tolerating it well. Will continue to monitor and tx pt according to MD orders.

## 2022-05-20 ENCOUNTER — Ambulatory Visit: Payer: Medicare Other | Admitting: Physical Therapy

## 2022-06-03 ENCOUNTER — Ambulatory Visit: Payer: Medicare Other | Attending: Physician Assistant | Admitting: Physical Therapy

## 2022-06-03 DIAGNOSIS — M542 Cervicalgia: Secondary | ICD-10-CM | POA: Diagnosis present

## 2022-06-03 DIAGNOSIS — M5136 Other intervertebral disc degeneration, lumbar region: Secondary | ICD-10-CM | POA: Diagnosis present

## 2022-06-03 DIAGNOSIS — M6281 Muscle weakness (generalized): Secondary | ICD-10-CM | POA: Diagnosis present

## 2022-06-03 NOTE — Therapy (Signed)
OUTPATIENT PHYSICAL THERAPY TREATMENT NOTE  Patient Name: Krista Austin MRN: [id number] DOB:[birth year], 77 y.o., female Today's Date: 06/03/2022  PCP: [name] PA REFERRING PROVIDER: [name] PA       PT End of Session - 06/03/22 1104     Visit Number 31    Date for PT Re-Evaluation 06/24/22    Authorization Type medicare A/B 30th visit prog note    Progress Note Due on Visit 73    PT Start Time 1100    PT Stop Time 1144    PT Time Calculation (min) 44 min    Activity Tolerance Patient tolerated treatment well             Past Medical History:  Diagnosis Date   A-fib ([place])    Adenomatous polyp    Arthritis    "mostly fingers" (10/07/2016)   At high risk for falls    Celiac disease    "I do not have the disease; 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" (10/07/2016)   Chronic back pain    "worse in my neck; lower back also affected" (10/07/2016)   Chronic cholecystitis 83/38/2505   Complication of anesthesia    "never fully regained my taste after my cervical fusion; I don't take much RX so I'm very sensitive to any sedation" (10/07/2016)   Concussion    X2   DDD (degenerative disc disease), cervical    lumbar   Depression    "hx; take Prozac to keep me stable" (10/07/2016)   Dizziness    caused by compression of cervical disc   Family history of adverse reaction to anesthesia    "daughters get PONV; one daughter breaks out severely; grandson got suspended in a state of anxiousness after kidney surgery; had to be held for hours"   GERD (gastroesophageal reflux disease)    Hyperlipidemia    OSA on CPAP    Pneumonia    "once as an adult" (10/07/2016)   Thyroid nodule    Type II diabetes mellitus ([place])    controlled by diet and exercide   Past Surgical History:  Procedure Laterality Date   ANTERIOR CERVICAL DECOMP/DISCECTOMY FUSION  2015   BACK SURGERY     CARPAL TUNNEL RELEASE Bilateral    CATARACT EXTRACTION, BILATERAL Bilateral 09/10/2014 - 09/24/2014   by  Darleen Crocker   CHOLECYSTECTOMY N/A 10/16/2016   Procedure: LAPAROSCOPIC CHOLECYSTECTOMY;  Surgeon: [name], MD;  Location: [facility];  Service: General;  Laterality: N/A;   LAPAROSCOPIC CHOLECYSTECTOMY  10/16/2016   LUMBAR [place] SURGERY  2007   TONSILLECTOMY  05/05/1983   TUBAL LIGATION  02/09/1979   Patient Active Problem List   Diagnosis Date Noted   Closed fracture of 9th & 10th ribs of right side 12/17/2016   Pneumothorax on right 12/17/2016   Lumbar degenerative disc disease 12/17/2016   Essential hypertension 12/17/2016   Dyslipidemia 10/07/2016   Pulmonary nodule 10/07/2016   Abnormal CXR    Chest pain 10/06/2016   S/P lumbar spinal fusion 08/05/2016   Obstructive sleep apnea syndrome, mild 10/27/2013   Controlled type 2 diabetes mellitus without complication, without long-term current use of insulin ([place]) 06/19/2011   GAD (generalized anxiety disorder) 11/01/2009    REFERRING DIAG: neck pain; back pain  THERAPY DIAG: back pain Cervicalgia  Muscle weakness (generalized)  Lumbar degenerative disc disease  Rationale for Evaluation and Treatment Rehabilitation  PERTINENT HISTORY: spinal fusion 2018; bil carpal tunnel surgery; DM  PRECAUTIONS: none  SUBJECTIVE:   SUBJECTIVE STATEMENT:  Low back pain and left hip and left LE is bothering me the most today.  Been sick.  Had cortisone injections in both carpal tunnels which has helped but the side effects were bad.   Extensive dental work upcoming.  Finished cardiac work up--all was good and can be medically managed.  No vertigo recently.     PAIN:  PAIN:  Are you having pain? Yes NPRS scale: 1-2/10 Pain location: back; left hip/buttock, neck  CERVICAL ROM:   Active ROM A/PROM (deg) 11/29 12/20 1/31  Flexion 35 35 40  Extension 35 35 35  Right lateral flexion 18 20   Left lateral flexion 26 25   Right rotation 30 30   Left rotation 36 36    Grip strength:    (Blank rows = not tested)  LUMBAR ROM:    Active  A/PROM  11/29 12/20 1/31  Flexion 45 45   Extension '10 10 10  '$ Right lateral flexion '15 15 20  '$ Left lateral flexion '15 15 20  '$ Right rotation     Left rotation      (Blank rows = not tested)   Neck Disability Index:  40% moderate self perceived disability  Modified Oswestry:   36% moderate self perceived disability    TODAY'S TREATMENT:       DATE: 06/03/22 Manual therapy: soft tissue mobilization gluteal, upper traps, suboccipitals;  grade 1/2 thoracic PA mob Sidelying neutral gapping grade 3 20 sec 3x Trigger Point Dry-Needling  Treatment instructions: Expect mild to moderate muscle soreness. S/S of pneumothorax if dry needled over a lung field, and to seek immediate medical attention should they occur. Patient verbalized understanding of these instructions and education.  Patient Consent Given: Yes Education handout provided: Previously provided Muscles treated: right gluteals, bil upper traps, bil cervical multifidi, bil suboccipitals Electrical stimulation performed: no Parameters: NA Treatment response/outcome: improved pain and soft tissue mobility        DATE: 04/22/22  Manual therapy: soft tissue mobilization gluteal, upper traps, suboccipitals;  grade 1/2 thoracic PA mob Sidelying neutral gapping grade 3 20 sec 3x Trigger Point Dry-Needling  Treatment instructions: Expect mild to moderate muscle soreness. S/S of pneumothorax if dry needled over a lung field, and to seek immediate medical attention should they occur. Patient verbalized understanding of these instructions and education.  Patient Consent Given: Yes Education handout provided: Previously provided Muscles treated: right gluteals, bil upper traps, bil cervical multifidi, bil suboccipitals Electrical stimulation performed: no Parameters: NA Treatment response/outcome: improved pain and soft tissue mobility    DATE: 04/01/22 Cervical ROM Lumbar ROM Neck Disability Index Modified  Oswestry Manual therapy: soft tissue mobilization lumbar paraspinals, cervical paraspinals, upper traps, suboccipitals; grade 1/2 thoracic PA mob Trigger Point Dry-Needling  Treatment instructions: Expect mild to moderate muscle soreness. S/S of pneumothorax if dry needled over a lung field, and to seek immediate medical attention should they occur. Patient verbalized understanding of these instructions and education.  Patient Consent Given: Yes Education handout provided: Previously provided Muscles treated: right gluteals, bil lumbar multifid, bil upper traps, bil cervical multifidi, bil suboccipitals Electrical stimulation performed: Yes Parameters: 80 pps 67mn to bil lumbar multifidi Treatment response/outcome: improved pain and soft tissue mobility  PATIENT EDUCATION: Education details: plan of care Person educated: Patient Education method: Explanation Education comprehension: verbalized understanding  HOME EXERCISE PROGRAM: HEP for cervical ROM, neural mobility and lumbar/hip mobility        PT Long Term Goals - 03/13/22 1238       PT LONG TERM GOAL #1   Title The patient will be independent in self management techniques and strategies needed to take care of herself and her son    Time 12    Period Weeks    Status On-going    Target Date 06/24/22      PT LONG TERM GOAL #2   Title Pt will report at least 60% improvement in neck symptoms with light to medium lifting and vacumning   Time 12    Period Weeks    Status On-going      PT LONG TERM GOAL #3   Title Improved cervical  rotation ROM to 40 degrees needed for driving    Time 12    Period Weeks    Status Partially Met      PT LONG TERM GOAL #4   Title Neck Disability Index improved to 32% indicating improved function with less pain    Time 12    Period Weeks    Status  On-going      PT LONG TERM GOAL #5   Title Cervical and periscapular strength improved to 4+/5 needed for lifting/ carrying bags    Time 12    Period Weeks    Status Partially Met      PT LONG TERM GOAL #6   Title LBP and right peripheral symptoms improved by 65% or typically 3/10 with ADLs    Time 12    Period Weeks    Status Achieved      PT LONG TERM GOAL #7   Title Modified Oswestry score improved to 30% indicating improved function with less pain   Time 12    Period Weeks    Status On-going              Plan - 03/13/22 1239     Clinical Impression Statement The patient benefits significantly from dry needling combined with ES and manual therapy to stimulate underlying myofascial trigger points and muscular tissue in both lumbar and cervical regions.  Much improved soft tissue mobility and decreased tender point size and number following treatment session.  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   Some return of vertigo with getting off the treatment table but dissipates quickly.      Comorbidities DM; CKD; carpal tunnel    Examination-Activity Limitations Caring for Others;Carry;Lift;Bend;Squat    Examination-Participation Restrictions Cleaning;Community Activity;Meal Prep    Stability/Clinical Decision Making Stable/Uncomplicated    Rehab Potential Good    PT Frequency 1x / week    PT Duration 12 weeks    PT Treatment/Interventions ADLs/Self Care Home Management;Biofeedback;Cryotherapy;Electrical Stimulation;Moist Heat;Traction;Therapeutic activities;Therapeutic exercise;Neuromuscular re-education;Patient/family education;Manual techniques;Passive range of motion;Dry needling;Taping;Aquatic Therapy;Ultrasound    PT Next Visit Plan  KX;  cervical and lumbar ROM;  cervical mobs PA;   DN/ES combo to neck/thoracic or lumbar region; DN glutes, piriformis, cervical musculature as needed; U/S; ex; patient  education   PT Home Exercise Plan Access Code: [place], PT 06/03/22 4:01 PM Phone: [phone] Fax: [phone]

## 2022-06-17 ENCOUNTER — Encounter: Payer: Medicare Other | Admitting: Physical Therapy

## 2022-06-23 ENCOUNTER — Ambulatory Visit: Payer: Medicare Other | Attending: Physician Assistant | Admitting: Physical Therapy

## 2022-06-23 DIAGNOSIS — M6281 Muscle weakness (generalized): Secondary | ICD-10-CM | POA: Diagnosis present

## 2022-06-23 DIAGNOSIS — M5136 Other intervertebral disc degeneration, lumbar region: Secondary | ICD-10-CM | POA: Diagnosis present

## 2022-06-23 DIAGNOSIS — M542 Cervicalgia: Secondary | ICD-10-CM | POA: Insufficient documentation

## 2022-06-23 NOTE — Therapy (Signed)
OUTPATIENT PHYSICAL THERAPY TREATMENT NOTE/DISCHARGE SUMMARY  Patient Name: Krista Austin MRN: [id number] DOB:[birth year], 77 y.o., female Today's Date: 06/23/2022  PCP: [name] PA REFERRING PROVIDER: [name] PA       PT End of Session - 06/23/22 1236     Visit Number 50    Date for PT Re-Evaluation 06/24/22    Authorization Type medicare A/B 40th visit prog note    PT Start Time [id number]    PT Stop Time 1225    PT Time Calculation (min) 40 min    Activity Tolerance Patient tolerated treatment well             Past Medical History:  Diagnosis Date   A-fib ([place])    Adenomatous polyp    Arthritis    "mostly fingers" (10/07/2016)   At high risk for falls    Celiac disease    "I do not have the disease; 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" (10/07/2016)   Chronic back pain    "worse in my neck; lower back also affected" (10/07/2016)   Chronic cholecystitis [id number]   Complication of anesthesia    "never fully regained my taste after my cervical fusion; I don't take much RX so I'm very sensitive to any sedation" (10/07/2016)   Concussion    X2   DDD (degenerative disc disease), cervical    lumbar   Depression    "hx; take Prozac to keep me stable" (10/07/2016)   Dizziness    caused by compression of cervical disc   Family history of adverse reaction to anesthesia    "daughters get PONV; one daughter breaks out severely; grandson got suspended in a state of anxiousness after kidney surgery; had to be held for hours"   GERD (gastroesophageal reflux disease)    Hyperlipidemia    OSA on CPAP    Pneumonia    "once as an adult" (10/07/2016)   Thyroid nodule    Type II diabetes mellitus ([place])    controlled by diet and exercide   Past Surgical History:  Procedure Laterality Date   ANTERIOR CERVICAL DECOMP/DISCECTOMY FUSION  2015   BACK SURGERY     CARPAL TUNNEL RELEASE Bilateral    CATARACT EXTRACTION, BILATERAL Bilateral 09/10/2014 - 09/24/2014   by Darleen Crocker    CHOLECYSTECTOMY N/A 10/16/2016   Procedure: LAPAROSCOPIC CHOLECYSTECTOMY;  Surgeon: [name], MD;  Location: [facility];  Service: General;  Laterality: N/A;   LAPAROSCOPIC CHOLECYSTECTOMY  10/16/2016   LUMBAR [place] SURGERY  2007   TONSILLECTOMY  05/05/1983   TUBAL LIGATION  02/09/1979   Patient Active Problem List   Diagnosis Date Noted   Closed fracture of 9th & 10th ribs of right side 12/17/2016   Pneumothorax on right 12/17/2016   Lumbar degenerative disc disease 12/17/2016   Essential hypertension 12/17/2016   Dyslipidemia 10/07/2016   Pulmonary nodule 10/07/2016   Abnormal CXR    Chest pain 10/06/2016   S/P lumbar spinal fusion 08/05/2016   Obstructive sleep apnea syndrome, mild 10/27/2013   Controlled type 2 diabetes mellitus without complication, without long-term current use of insulin ([place]) 06/19/2011   GAD (generalized anxiety disorder) 11/01/2009    REFERRING DIAG: neck pain; back pain  THERAPY DIAG: back pain Cervicalgia  Muscle weakness (generalized)  Lumbar degenerative disc disease  Rationale for Evaluation and Treatment Rehabilitation  PERTINENT HISTORY: spinal fusion 2018; bil carpal tunnel surgery; DM  PRECAUTIONS: none  SUBJECTIVE:   SUBJECTIVE STATEMENT:  it was wonderful last time.  My hands  are starting to hurt again. The neck and wrist/carpal tunnel issue is connected. Splinting the hand more now.  Neck area right > left;  Left side buttock is good.  Right buttock is a little painful.    PAIN:  PAIN:  Are you having pain? Yes NPRS scale: 1-2/10 Pain location: back; left hip/buttock, neck  CERVICAL ROM:   Active ROM A/PROM (deg) 11/29 12/20 1/31 2/20  Flexion 35 35 40 42  Extension 35 35 35 35  Right lateral flexion 18 20  28  $ Left lateral flexion 26 25  32  Right rotation 30 30  38  Left rotation 36 36  28     (Blank rows = not tested)  LUMBAR ROM:   Active  A/PROM  11/29 12/20 1/31 2/20  Flexion 45 45  65  Extension 10 10 10 15   $ Right lateral flexion 15 15 20 18  $ Left lateral flexion 15 15 20 22  $ Right rotation      Left rotation       (Blank rows = not tested)   Neck Disability Index:  40% moderate self perceived disability 2/20: 36% Modified Oswestry:   36% moderate self perceived disability  2/20:  32%  TODAY'S TREATMENT:       DATE: 06/23/22 Cervical ROM Lumbar ROM NDI Oswestry Progress check toward goals Manual therapy:  upper traps, suboccipitals; levator scap soft tissue mobilization grade 1/2 thoracic PA mob Trigger Point Dry-Needling  Treatment instructions: Expect mild to moderate muscle soreness. S/S of pneumothorax if dry needled over a lung field, and to seek immediate medical attention should they occur. Patient verbalized understanding of these instructions and education.  Patient Consent Given: Yes Education handout provided: Previously provided Muscles treated:  bil upper traps, bil cervical multifidi, bil suboccipitals Electrical stimulation performed: no Parameters: NA Treatment response/outcome: improved pain and soft tissue mobility       DATE: 06/03/22 Manual therapy: soft tissue mobilization gluteal, upper traps, suboccipitals;  grade 1/2 thoracic PA mob Sidelying neutral gapping grade 3 20 sec 3x Trigger Point Dry-Needling  Treatment instructions: Expect mild to moderate muscle soreness. S/S of pneumothorax if dry needled over a lung field, and to seek immediate medical attention should they occur. Patient verbalized understanding of these instructions and education.  Patient Consent Given: Yes Education handout provided: Previously provided Muscles treated: right gluteals, bil upper traps, bil cervical multifidi, bil suboccipitals Electrical stimulation performed: no Parameters: NA Treatment response/outcome: improved pain and soft tissue mobility                                                                                                                                            PATIENT EDUCATION: Education details: plan of care Person educated: Patient Education method: Explanation Education comprehension: verbalized understanding  HOME EXERCISE PROGRAM: HEP for cervical ROM, neural mobility and lumbar/hip mobility  PT Long Term Goals - 03/13/22 1238       PT LONG TERM GOAL #1   Title The patient will be independent in self management techniques and strategies needed to take care of herself and her son    Time 12    Period Weeks    Status On-going    Target Date 06/24/22      PT LONG TERM GOAL #2   Title Pt will report at least 60% improvement in neck symptoms with light to medium lifting and vacumning   Time 12    Period Weeks    Status Met 2/20     PT LONG TERM GOAL #3   Title Improved cervical  rotation ROM to 40 degrees needed for driving    Time 12    Period Weeks    Status Partially Met      PT LONG TERM GOAL #4   Title Neck Disability Index improved to 32% indicating improved function with less pain    Time 12    Period Weeks    Status Partially met     PT LONG TERM GOAL #5   Title Cervical and periscapular strength improved to 4+/5 needed for lifting/ carrying bags    Time 12    Period Weeks    Status Partially Met      PT LONG TERM GOAL #6   Title LBP and right peripheral symptoms improved by 65% or typically 3/10 with ADLs    Time 12    Period Weeks    Status Achieved      PT LONG TERM GOAL #7   Title Modified Oswestry score improved to 30% indicating improved function with less pain   Time 12    Period Weeks    Status Partially met 32%             Plan - 03/13/22 1239     Clinical Impression Statement The patient has made progress with rehab goals, with noted improvements in pain reduction, outcome scores Neck Disability Index and Modified Oswestry score, cervical and lumbar ROM,  and functional mobility.  She reports feels ready to self manage this chronic problem with multiple tools  she has access to at home (TENS, traction, heat).    Recommend discharge from PT at this time.  If future needs arise or is she is unable to control symptoms with her home strategies, I would be happy to see Vianey with a new PT referral.       Comorbidities DM; CKD; carpal tunnel    Examination-Activity Limitations Caring for Others;Carry;Lift;Bend;Squat    Examination-Participation Restrictions Cleaning;Community Activity;Meal Prep    Stability/Clinical Decision Making Stable/Uncomplicated    Rehab Potential Good    PT Frequency 1x / week    PT Duration 12 weeks    PT Treatment/Interventions ADLs/Self Care Home Management;Biofeedback;Cryotherapy;Electrical Stimulation;Moist Heat;Traction;Therapeutic activities;Therapeutic exercise;Neuromuscular re-education;Patient/family education;Manual techniques;Passive range of motion;Dry needling;Taping;Aquatic Therapy;Ultrasound    PT Next Visit Plan  KX;  cervical and lumbar ROM;  cervical mobs PA;   DN/ES combo to neck/thoracic or lumbar region; DN glutes, piriformis, cervical musculature as needed; U/S; ex; patient education   PT Home Exercise Plan Access Code: [email or web address]               PHYSICAL THERAPY DISCHARGE SUMMARY  Visits from Start of Care: 32  Current functional level related to goals / functional outcomes: See clinical impressions above   Remaining deficits: As above   Education / Equipment: HEP  Patient agrees to discharge. Patient goals were partially met. Patient is being discharged due to maximized rehab potential.   Ruben Im, PT 06/23/22 5:06 PM Phone: [phone] Fax: [phone]

## 2022-07-01 ENCOUNTER — Ambulatory Visit
Admission: RE | Admit: 2022-07-01 | Discharge: 2022-07-01 | Disposition: A | Discharge status: Home / Self Care | Payer: Medicare Other | Source: Ambulatory Visit | Attending: Physician Assistant | Admitting: Physician Assistant

## 2022-07-01 DIAGNOSIS — Z87891 Personal history of nicotine dependence: Secondary | ICD-10-CM

## 2022-07-03 ENCOUNTER — Other Ambulatory Visit: Payer: Self-pay | Admitting: Acute Care

## 2022-07-03 DIAGNOSIS — Z122 Encounter for screening for malignant neoplasm of respiratory organs: Secondary | ICD-10-CM

## 2022-07-03 DIAGNOSIS — Z87891 Personal history of nicotine dependence: Secondary | ICD-10-CM

## 2022-09-21 ENCOUNTER — Encounter: Payer: Self-pay | Admitting: Cardiology

## 2022-09-21 ENCOUNTER — Ambulatory Visit: Payer: Medicare Other | Attending: Cardiology | Admitting: Cardiology

## 2022-09-21 VITALS — BP 96/58 | HR 83 | Ht 65.0 in | Wt 146.0 lb

## 2022-09-21 DIAGNOSIS — I10 Essential (primary) hypertension: Secondary | ICD-10-CM | POA: Diagnosis present

## 2022-09-21 DIAGNOSIS — I25118 Atherosclerotic heart disease of native coronary artery with other forms of angina pectoris: Secondary | ICD-10-CM | POA: Diagnosis present

## 2022-09-21 DIAGNOSIS — R0609 Other forms of dyspnea: Secondary | ICD-10-CM

## 2022-09-21 NOTE — Patient Instructions (Signed)
Medication Instructions:  The current medical regimen is effective;  continue present plan and medications.  *If you need a refill on your cardiac medications before your next appointment, please call your pharmacy*  Follow-Up: At [facility], you and your health needs are our priority.  As part of our continuing mission to provide you with exceptional heart care, we have created designated Provider Care Teams.  These Care Teams include your primary Cardiologist (physician) and Advanced Practice Providers (APPs -  Physician Assistants and Nurse Practitioners) who all work together to provide you with the care you need, when you need it.  We recommend signing up for the patient portal called "MyChart".  Sign up information is provided on this After Visit Summary.  MyChart is used to connect with patients for Virtual Visits (Telemedicine).  Patients are able to view lab/test results, encounter notes, upcoming appointments, etc.  Non-urgent messages can be sent to your provider as well.   To learn more about what you can do with MyChart, go to [email or web address].    Your next appointment:   1 year(s)  Provider:   Jari Favre, PA-C, Robin Searing, NP, Jacolyn Reedy, PA-C, Eligha Bridegroom, NP, or Tereso Newcomer, PA-C      Then, Donato Schultz, MD will plan to see you again in 2 year(s).

## 2022-09-21 NOTE — Progress Notes (Signed)
Cardiology Office Note:    Date:  09/21/2022   ID:  Krista Austin, DOB [birth year], MRN [id number]  PCP:  Krista Austin   [facility] Providers Cardiologist:  Krista Schultz, MD     Referring MD: Krista Duff, PA-C   History of Present Illness:    Krista Austin is a 77 y.o. female here for the follow-up of cardiac PET after abnormal EKG and significant coronary calcifications were noted.  Cardiac PET took place on 05/12/2022-Fixed inferior perfusion defect with normal wall motion and normal RCA territory flow reserve, consistent with artifact. Severe coronary calcifications, but no TID or drop in EF with stress to suggest multivessel CAD. In addition, flow reserves are normal globally and in each coronary distribution. Low risk study.    She has a history of cervical disc degeneration, diabetes mellitus type 2, mixed hyperlipidemia, and coronary artery calcification, and COPD.   She has signifcant family history of heart disease  Prior to her quitting smoking in 2018, she had been smoking for about 50 years.   Her son Krista Austin has Down syndrome, has seen me in the past.  No fevers chills nausea chest pain shortness of breath.  Past Medical History:  Diagnosis Date   A-fib (HCC)    Adenomatous polyp    Arthritis    "mostly fingers" (10/07/2016)   At high risk for falls    Celiac disease    "I do not have the disease; 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" (10/07/2016)   Chronic back pain    "worse in my neck; lower back also affected" (10/07/2016)   Chronic cholecystitis 10/16/2016   Complication of anesthesia    "never fully regained my taste after my cervical fusion; I don't take much RX so I'm very sensitive to any sedation" (10/07/2016)   Concussion    X2   DDD (degenerative disc disease), cervical    lumbar   Depression    "hx; take Prozac to keep me stable" (10/07/2016)   Dizziness    caused by compression of cervical disc   Family history of adverse reaction to  anesthesia    "daughters get PONV; one daughter breaks out severely; grandson got suspended in a state of anxiousness after kidney surgery; had to be held for hours"   GERD (gastroesophageal reflux disease)    Hyperlipidemia    OSA on CPAP    Pneumonia    "once as an adult" (10/07/2016)   Thyroid nodule    Type II diabetes mellitus (HCC)    controlled by diet and exercide    Past Surgical History:  Procedure Laterality Date   ANTERIOR CERVICAL DECOMP/DISCECTOMY FUSION  2015   BACK SURGERY     CARPAL TUNNEL RELEASE Bilateral    CATARACT EXTRACTION, BILATERAL Bilateral 09/10/2014 - 09/24/2014   by Krista Austin   CHOLECYSTECTOMY N/A 10/16/2016   Procedure: LAPAROSCOPIC CHOLECYSTECTOMY;  Surgeon: Krista Rudd, MD;  Location: [facility] OR;  Service: General;  Laterality: N/A;   LAPAROSCOPIC CHOLECYSTECTOMY  10/16/2016   LUMBAR DISC SURGERY  2007   TONSILLECTOMY  05/05/1983   TUBAL LIGATION  02/09/1979    Current Medications: Current Meds  Medication Sig   acetaminophen (TYLENOL) 500 MG tablet Take 1 tablet (500 mg total) by mouth every 4 (four) hours. (Patient taking differently: Take 500 mg by mouth as needed for moderate pain or headache.)   Apoaequorin (PREVAGEN) 10 MG CAPS daily at 6 (six) AM.   aspirin EC 81 MG tablet Take 81  mg by mouth at bedtime.    cetirizine (ZYRTEC) 10 MG tablet Take 10 mg by mouth at bedtime.   FLUoxetine (PROZAC) 40 MG capsule Take 40 mg by mouth at bedtime.    gabapentin (NEURONTIN) 100 MG capsule Take 100 mg by mouth as needed (nerve pain).   hydrocortisone cream 1 % Apply 1 application topically daily as needed for itching.   ibuprofen (ADVIL) 200 MG tablet Take 200 mg by mouth daily.   lisinopril (PRINIVIL,ZESTRIL) 10 MG tablet Take 10 mg by mouth daily.   methocarbamol (ROBAXIN) 500 MG tablet Take 500 mg by mouth in the morning and at bedtime.   Multiple Vitamins-Minerals (MULTIVITAMIN WITH MINERALS) tablet Take 1 tablet by mouth at bedtime.    mupirocin  ointment (BACTROBAN) 2 % Apply topically as needed (rash).   Omega-3 Fatty Acids (FISH OIL PO) Take 1 capsule by mouth daily.    pantoprazole (PROTONIX) 40 MG tablet Take 40 mg by mouth daily.   rosuvastatin (CRESTOR) 10 MG tablet Take 10 mg by mouth at bedtime.   Semaglutide,0.25 or 0.5MG /DOS, (OZEMPIC, 0.25 OR 0.5 MG/DOSE,) 2 MG/1.5ML SOPN Inject 0.25 mg into the muscle once a week.     Allergies:   Hydrocodone and Lactose intolerance (gi)   Social History   Socioeconomic History   Marital status: Divorced    Spouse name: Not on file   Number of children: Not on file   Years of education: Not on file   Highest education level: Not on file  Occupational History   Not on file  Tobacco Use   Smoking status: Former    Packs/day: 1.00    Years: 50.00    Additional pack years: 0.00    Total pack years: 50.00    Types: Cigarettes    Quit date: 10/16/2016    Years since quitting: 5.9   Smokeless tobacco: Never   Tobacco comments:    10/16/2016 "weaned down the past 9 days; on RX now; done!!!!!!!!"  Vaping Use   Vaping Use: Never used  Substance and Sexual Activity   Alcohol use: No   Drug use: No   Sexual activity: Not Currently  Other Topics Concern   Not on file  Social History Narrative   Not on file   Social Determinants of Health   Financial Resource Strain: Not on file  Food Insecurity: Not on file  Transportation Needs: Not on file  Physical Activity: Not on file  Stress: Not on file  Social Connections: Not on file     Family History: The patient's family history includes Arthritis in her maternal grandmother, mother, and sister; Atrial fibrillation in her father; Breast cancer in her maternal grandmother; COPD in her father; Dementia in her maternal grandmother; Diabetes type I in her maternal grandfather; Fibromyalgia in her sister; Hyperlipidemia in her mother and sister; Hypertension in her maternal grandmother; Hypothyroidism in her mother, sister, and  sister; Macular degeneration in her mother; Mitral valve prolapse in her mother; Osteoarthritis in her sister; Other in an other family member.  ROS:   Please see the history of present illness.    All other systems reviewed and are negative.  EKGs/Labs/Other Studies Reviewed:    The following studies were reviewed today:  Cardiac Studies & Procedures     STRESS TESTS  NM PET CT CARDIAC PERFUSION MULTI W/ABSOLUTE BLOODFLOW 05/12/2022  Narrative   Fixed inferior perfusion defect with normal wall motion and normal RCA territory flow reserve, consistent with artifact.  Severe  coronary calcifications, but no TID or drop in EF with stress to suggest multivessel CAD.  In addition, flow reserves are normal globally and in each coronary distribution.  Low risk study.   LV perfusion is abnormal. There is no evidence of ischemia. There is no evidence of infarction. Defect 1: There is a medium defect with mild reduction in uptake present in the apical to basal inferior location(s) that is fixed. There is normal wall motion in the defect area. Consistent with artifact caused by bowel tracer uptake and diaphragmatic attenuation.   Rest left ventricular function is normal. Stress left ventricular function is normal. End diastolic cavity size is normal. End systolic cavity size is normal.   Myocardial blood flow was computed to be 0.85ml/g/min at rest and 2.59ml/g/min at stress. Global myocardial blood flow reserve was 3.74 and was normal.   Coronary calcium was present on the attenuation correction CT images. Severe coronary calcifications were present. Coronary calcifications were present in the left anterior descending artery, left circumflex artery and right coronary artery distribution(s).   The study is normal. The study is low risk.   Electronically signed by Epifanio Lesches, MD  CLINICAL DATA:  This over-read does not include interpretation of cardiac or coronary anatomy or pathology. The  Cardiac PET CT interpretation by the cardiologist is attached.  COMPARISON:  None available.  FINDINGS: No suspicious nodules, masses, or infiltrates are identified in the visualized portion of the lungs. No pleural fluid seen.  The visualized portions of the mediastinum and chest wall are unremarkable.  IMPRESSION: No significant non-cardiac abnormality identified.   Electronically Signed By: Danae Orleans M.D. On: 05/12/2022 11:16   ECHOCARDIOGRAM  ECHOCARDIOGRAM COMPLETE 04/17/2022  Narrative ECHOCARDIOGRAM REPORT    Patient Name:   BARNETTA BEVIER Date of Exam: 04/16/2022 Medical Rec #:  [id number]      Height:       65.0 in Accession #:    [id number]     Weight:       151.0 lb Date of Birth:  1945-08-23       BSA:          1.755 m Patient Age:    76 years       BP:           100/60 mmHg Patient Gender: F              HR:           56 bpm. Exam Location:  [address]  Procedure: 3D Echo, 2D Echo, Cardiac Doppler, Color Doppler and Strain Analysis  Indications:    R06.09 Dyspnea  History:        Patient has prior history of Echocardiogram examinations, most recent 10/07/2016. CAD, Abnormal ECG, COPD, Arrythmias:Atrial Fibrillation, Signs/Symptoms:Dyspnea and Dizziness/Lightheadedness; Risk Factors:Family History of Coronary Artery Disease, Hypertension, Diabetes, Dyslipidemia, Former Smoker and Sleep Apnea.  Sonographer:    Farrel Conners RDCS Referring Phys: Jake Bathe  IMPRESSIONS   1. Left ventricular ejection fraction, by estimation, is 60 to 65%. Left ventricular ejection fraction by 3D volume is 68 %. The left ventricle has normal function. The left ventricle has no regional wall motion abnormalities. Left ventricular diastolic parameters are consistent with Grade I diastolic dysfunction (impaired relaxation). The average left ventricular global longitudinal strain is -23.7 %. The global longitudinal strain is normal. 2. Right ventricular systolic  function is normal. The right ventricular size is normal. Tricuspid regurgitation signal is inadequate for assessing PA pressure. 3. The  mitral valve is normal in structure. No evidence of mitral valve regurgitation. No evidence of mitral stenosis. 4. The aortic valve is tricuspid. There is mild calcification of the aortic valve. Aortic valve regurgitation is not visualized. No aortic stenosis is present. 5. The inferior vena cava is normal in size with greater than 50% respiratory variability, suggesting right atrial pressure of 3 mmHg.  FINDINGS Left Ventricle: Left ventricular ejection fraction, by estimation, is 60 to 65%. Left ventricular ejection fraction by 3D volume is 68 %. The left ventricle has normal function. The left ventricle has no regional wall motion abnormalities. The average left ventricular global longitudinal strain is -23.7 %. The global longitudinal strain is normal. The left ventricular internal cavity size was normal in size. There is no left ventricular hypertrophy. Left ventricular diastolic parameters are consistent with Grade I diastolic dysfunction (impaired relaxation).  Right Ventricle: The right ventricular size is normal. No increase in right ventricular wall thickness. Right ventricular systolic function is normal. Tricuspid regurgitation signal is inadequate for assessing PA pressure.  Left Atrium: Left atrial size was normal in size.  Right Atrium: Right atrial size was normal in size.  Pericardium: Trivial pericardial effusion is present.  Mitral Valve: The mitral valve is normal in structure. No evidence of mitral valve regurgitation. No evidence of mitral valve stenosis.  Tricuspid Valve: The tricuspid valve is normal in structure. Tricuspid valve regurgitation is trivial. No evidence of tricuspid stenosis.  Aortic Valve: The aortic valve is tricuspid. There is mild calcification of the aortic valve. Aortic valve regurgitation is not visualized. No  aortic stenosis is present.  Pulmonic Valve: The pulmonic valve was normal in structure. Pulmonic valve regurgitation is trivial. No evidence of pulmonic stenosis.  Aorta: The aortic root is normal in size and structure and the ascending aorta was not well visualized.  Venous: The inferior vena cava is normal in size with greater than 50% respiratory variability, suggesting right atrial pressure of 3 mmHg.  IAS/Shunts: No atrial level shunt detected by color flow Doppler.   LEFT VENTRICLE PLAX 2D LVIDd:         4.60 cm         Diastology LVIDs:         2.80 cm         LV e' medial:    8.24 cm/s LV PW:         0.60 cm         LV E/e' medial:  7.7 LV IVS:        0.60 cm         LV e' lateral:   10.40 cm/s LVOT diam:     1.90 cm         LV E/e' lateral: 6.1 LV SV:         66 LV SV Index:   37              2D LVOT Area:     2.84 cm        Longitudinal Strain 2D Strain GLS  -23.2 % (A2C): 2D Strain GLS  -25.8 % (A3C): 2D Strain GLS  -22.1 % (A4C): 2D Strain GLS  -23.7 % Avg:  3D Volume EF LV 3D EF:    Left ventricul ar ejection fraction by 3D volume is 68 %.  3D Volume EF: 3D EF:        68 % LV EDV:       102 ml LV ESV:  32 ml LV SV:        69 ml  RIGHT VENTRICLE RV Basal diam:  2.90 cm RV S prime:     12.40 cm/s TAPSE (M-mode): 2.3 cm  LEFT ATRIUM             Index        RIGHT ATRIUM           Index LA diam:        3.80 cm 2.16 cm/m   RA Area:     17.80 cm LA Vol (A2C):   50.7 ml 28.88 ml/m  RA Volume:   49.90 ml  28.43 ml/m LA Vol (A4C):   44.8 ml 25.52 ml/m LA Biplane Vol: 48.6 ml 27.69 ml/m AORTIC VALVE LVOT Vmax:   111.00 cm/s LVOT Vmean:  72.700 cm/s LVOT VTI:    0.231 m  AORTA Ao Root diam: 2.90 cm Ao Asc diam:  3.10 cm  MITRAL VALVE MV Area (PHT): cm         SHUNTS MV Decel Time: 261 msec    Systemic VTI:  0.23 m MV E velocity: 63.40 cm/s  Systemic Diam: 1.90 cm MV A velocity: 83.10 cm/s MV E/A ratio:  0.76  Weston Brass  MD Electronically signed by Weston Brass MD Signature Date/Time: 04/17/2022/7:41:09 AM    Final              EKG:  EKG is personally reviewed and interpreted. 03/17/2022: EKG was not ordered. 01/06/2022: Normal sinus rhythm. Rate 78 bpm. Possible anterior infarct. Abnormal EKG.  Recent Labs: 01/02/2022: ALT 29; BUN 27; Creatinine, Ser 1.19; Hemoglobin 15.3; Platelets 216; Potassium 4.5; Sodium 138   Recent Lipid Panel    Component Value Date/Time   CHOL 111 10/07/2016 0808   TRIG 113 10/07/2016 0808   HDL 33 (L) 10/07/2016 0808   CHOLHDL 3.4 10/07/2016 0808   VLDL 23 10/07/2016 0808   LDLCALC 55 10/07/2016 0808     Risk Assessment/Calculations:          Physical Exam:    VS:  BP (!) 96/58   Pulse 83   Ht 5\' 5"  (1.651 m)   Wt 146 lb (66.2 kg)   SpO2 95%   BMI 24.30 kg/m     Wt Readings from Last 3 Encounters:  09/21/22 146 lb (66.2 kg)  03/17/22 151 lb (68.5 kg)  11/28/20 155 lb (70.3 kg)     GEN: Well nourished, well developed, in no acute distress HEENT: normal Neck: no JVD, carotid bruits, or masses Cardiac: RRR; no murmurs, rubs, or gallops,no edema  Respiratory:  clear to auscultation bilaterally, normal work of breathing GI: soft, nontender, nondistended, + BS MS: no deformity or atrophy Skin: warm and dry, no rash Neuro:  Alert and Oriented x 3, Strength and sensation are intact Psych: euthymic mood, full affect    ASSESSMENT:    1. Coronary artery disease of native artery of native heart with stable angina pectoris (HCC)   2. DOE (dyspnea on exertion)   3. Essential hypertension     PLAN:    In order of problems listed above:  Multivessel coronary artery disease -Cardiac PET and echo were reassuring.  No evidence of blood flow issues or 3 times daily. - Multivessel coronary artery disease/calcified plaque quite diffuse seen on CT scan personally reviewed and interpreted.  This includes the LAD RCA and circumflex artery. Her brother  had a "widow maker "lesion.  She has a strong family history  of coronary artery disease. - She is currently on aspirin 81 mg as well as Crestor 10 mg.  Excellent.  Her LDL most recently was in the 62s.  Fantastic.  I am comfortable with her staying with this Crestor 10 mg dose.  There was some concern about potential memory impairment recently.  Her mother had vascular dementia.  Her mother had an autopsy.  Continue with current plan.  Thigh pain bilaterally - When traversing stairs, she has been experiencing some thigh pain.  She does have diffuse calcified plaque in carotids as well as coronary arteries.  No evidence of proximal vessel arterial disease on arterial Dopplers 04/13/2022.. -Continue with aspirin as well as Crestor and excellent blood pressure control.  LDL goal less than 70.  She is treating all vascular disease.  Previously she did mention that she had mild plaque in her carotids.  Reassured her that she is being treated for that as well.  Former smoker - 50 pack years.  CT scan reviewed as above.  Diffuse multivessel CAD.  Hemoglobin A1c 5.6.  Excellent.  Doing very well.  Follow-up: 12 months with APP.  Medication Adjustments/Labs and Tests Ordered: Current medicines are reviewed at length with the patient today.  Concerns regarding medicines are outlined above.   No orders of the defined types were placed in this encounter.  No orders of the defined types were placed in this encounter.  Patient Instructions  Medication Instructions:  The current medical regimen is effective;  continue present plan and medications.  *If you need a refill on your cardiac medications before your next appointment, please call your pharmacy*  Follow-Up: At [facility], you and your health needs are our priority.  As part of our continuing mission to provide you with exceptional heart care, we have created designated Provider Care Teams.  These Care Teams include your primary  Cardiologist (physician) and Advanced Practice Providers (APPs -  Physician Assistants and Nurse Practitioners) who all work together to provide you with the care you need, when you need it.  We recommend signing up for the patient portal called "MyChart".  Sign up information is provided on this After Visit Summary.  MyChart is used to connect with patients for Virtual Visits (Telemedicine).  Patients are able to view lab/test results, encounter notes, upcoming appointments, etc.  Non-urgent messages can be sent to your provider as well.   To learn more about what you can do with MyChart, go to [email or web address].    Your next appointment:   1 year(s)  Provider:   Jari Favre, PA-C, Robin Searing, NP, Jacolyn Reedy, PA-C, Eligha Bridegroom, NP, or Tereso Newcomer, PA-C      Then, Krista Schultz, MD will plan to see you again in 2 year(s).         Signed, Krista Schultz, MD  09/21/2022 9:48 AM    [facility]

## 2022-09-29 ENCOUNTER — Telehealth: Payer: Self-pay

## 2022-09-29 NOTE — Telephone Encounter (Signed)
   Patient Name: Krista Austin  DOB: Jan 09, 1946 MRN: [id number]  Primary Cardiologist: Donato Schultz, MD  Chart reviewed as part of pre-operative protocol coverage. Given past medical history and time since last visit, based on ACC/AHA guidelines, AMAIRA BRADNEY is at acceptable risk for the planned procedure without further cardiovascular testing.   The patient was advised that if she develops new symptoms prior to surgery to contact our office to arrange for a follow-up visit, and she verbalized understanding.  Regarding ASA therapy, we recommend continuation of ASA throughout the perioperative period.  However, if the surgeon feels that cessation of ASA is required in the perioperative period, it may be stopped 5-7 days prior to surgery with a plan to resume it as soon as felt to be feasible from a surgical standpoint in the post-operative period.   I will route this recommendation to the requesting party via Epic fax function and remove from pre-op pool.  Please call with questions.  Napoleon Form, Leodis Rains, NP 09/29/2022, 11:54 AM

## 2022-09-29 NOTE — Telephone Encounter (Signed)
   Pre-operative Risk Assessment    Patient Name: Krista Austin  DOB: Jul 10, 1945 MRN: [id number]{ HEARTCARE STAFF-IMPORTANT INSTRUCTIONS 1 Red and Blue Text will auto delete once note is signed or closed. 2 Press F2 to navigate through template.   3 On drop down lists, L click to select >> R click to activate next field 4 Reason for Visit format is IMPORTANT!!  See Directions on No. 2 below. 5 Please review chart to determine if there is already a clearance note open for this procedure!!  DO NOT duplicate if a note already exists!!       Request for Surgical Clearance 1. What type of surgery is being performed? Enter name of procedure below and number of teeth if dental extraction.    Procedure:   EGD  and Colonoscopy  2. When is this surgery scheduled? Press F2 to enter date below and place date in Reason for Visit (see directions below).  Date of Surgery:  Clearance 10/05/22                            For convenience, highlight and copy (CTL+C) the Clearance MM/DD/YY phrase above. Click here to go to Reason for Visit.  Paste (CTL+V) the date.  Engineer, drilling.  Then click button underneath called Add Clearance MM/DD/YY as free text.      3. What is the name of the Surgeon, the Surgeon's Group or Practice, phone and fax number?  Press F2 and list below  Surgeon:  Charna Elizabeth, MD Surgeon's Group or Practice Name:  [facility] Phone number:  [phone] Fax number:  [phone]  4. What type of clearance is requested?  Medical or Cardiac Clearance only?  Pharmacy Clearance Only (Request is to hold medication only)?  Or Both?  Press F2 and select the clearance requested.  If both are needed, select both from the drop down list.     Type of Clearance Requested:   - Medical ; ASA not indicated to be held  5. What type of anesthesia will be used?  Press F2 and select the anesthesia to be used for the procedure.   Type of Anesthesia:  Propofol  6. Are there any other requests  or questions from the surgeon?    Additional requests/questions:    Garrel Ridgel   09/29/2022, 9:48 AM

## 2022-11-18 ENCOUNTER — Other Ambulatory Visit: Payer: Self-pay | Admitting: Physician Assistant

## 2022-11-18 DIAGNOSIS — Z1231 Encounter for screening mammogram for malignant neoplasm of breast: Secondary | ICD-10-CM

## 2022-12-28 ENCOUNTER — Ambulatory Visit: Payer: Medicare Other

## 2023-03-01 ENCOUNTER — Ambulatory Visit: Payer: Medicare Other

## 2023-03-25 ENCOUNTER — Ambulatory Visit: Payer: Medicare Other

## 2023-05-10 IMAGING — CR DG CHEST 2V
2 series · 2 of 2 positions shown · non-contrast
Comparison: 12/17/2016 CT 06/17/2020

CLINICAL DATA: Shortness of breath, COVID

EXAM:
CHEST - 2 VIEW

[w chest pa]
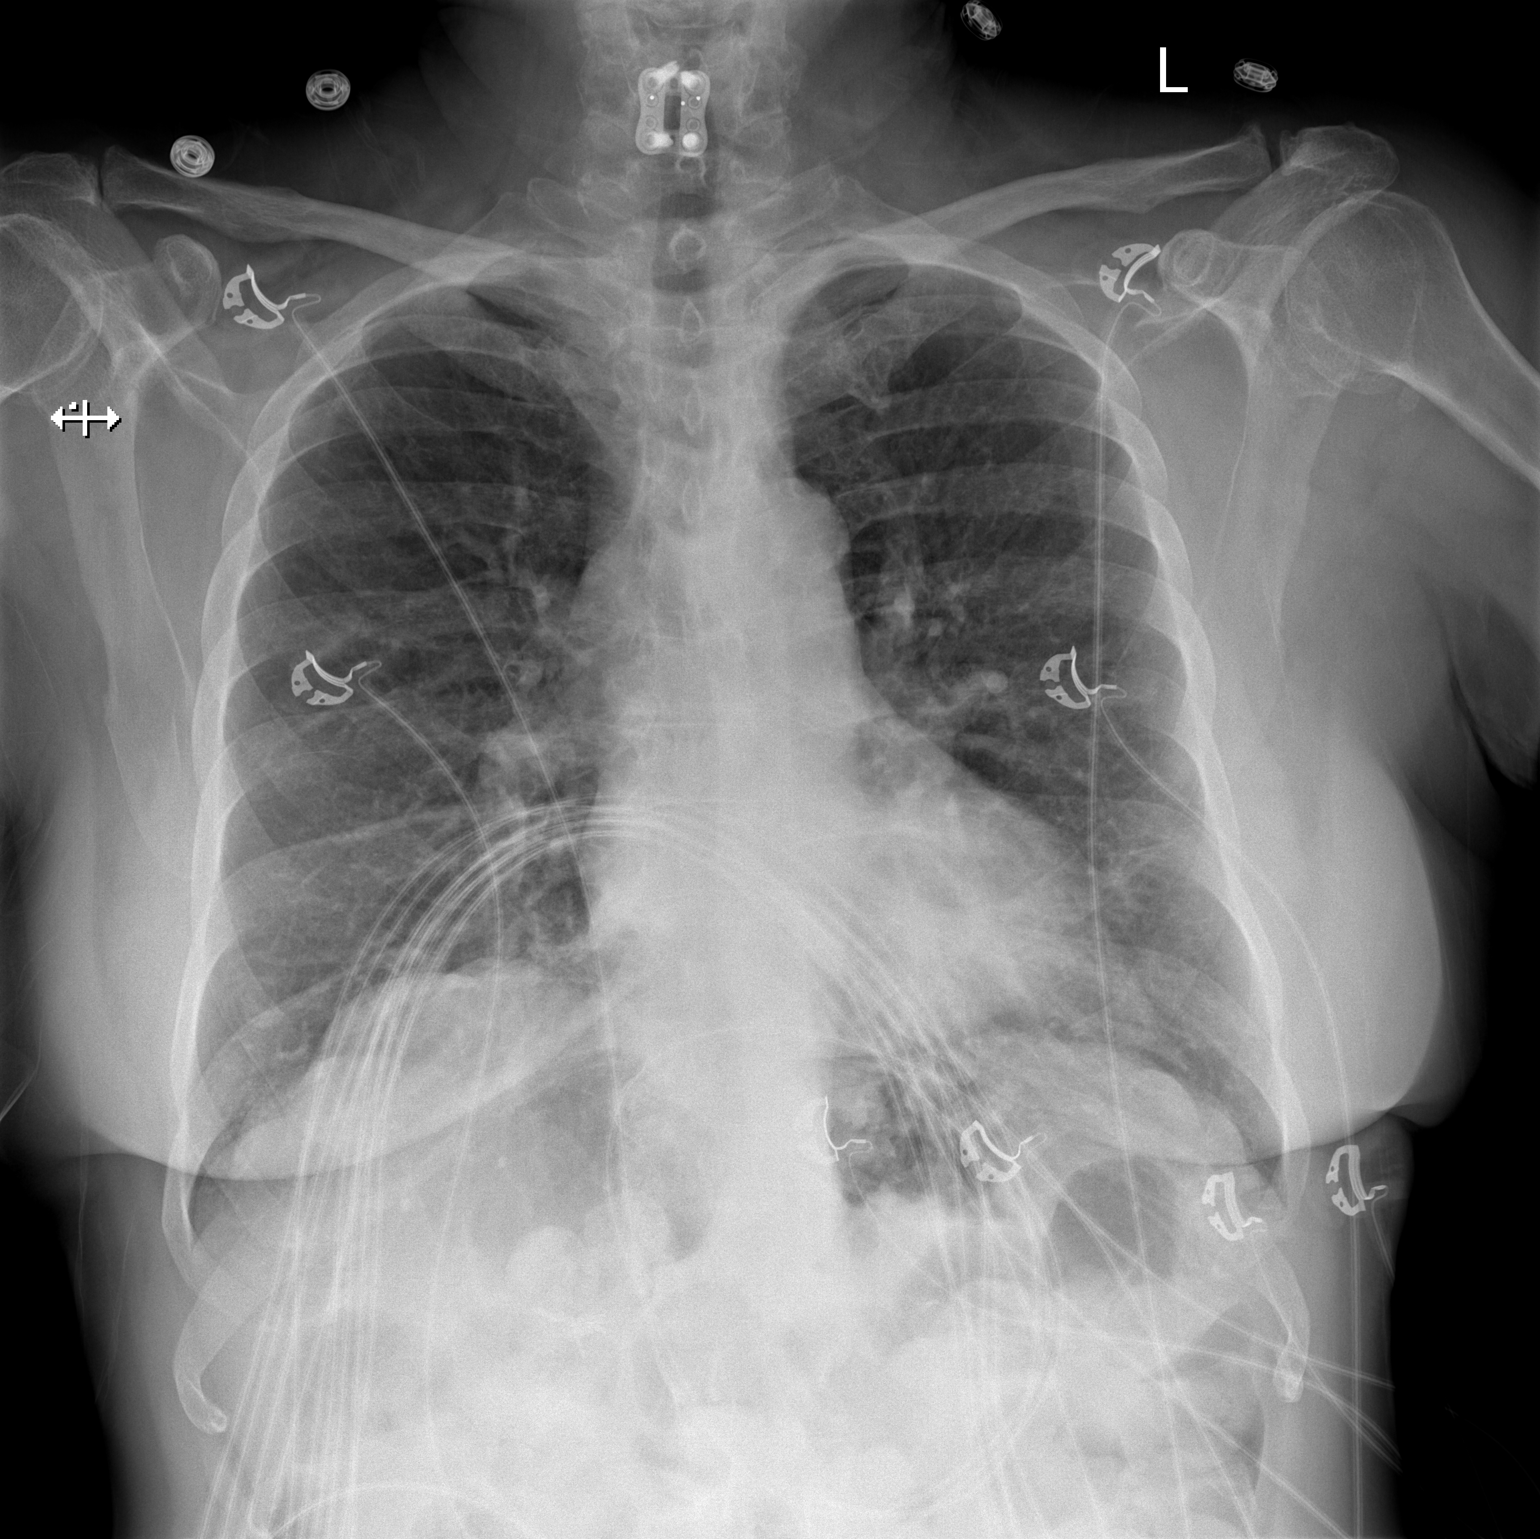

[w chest lat]
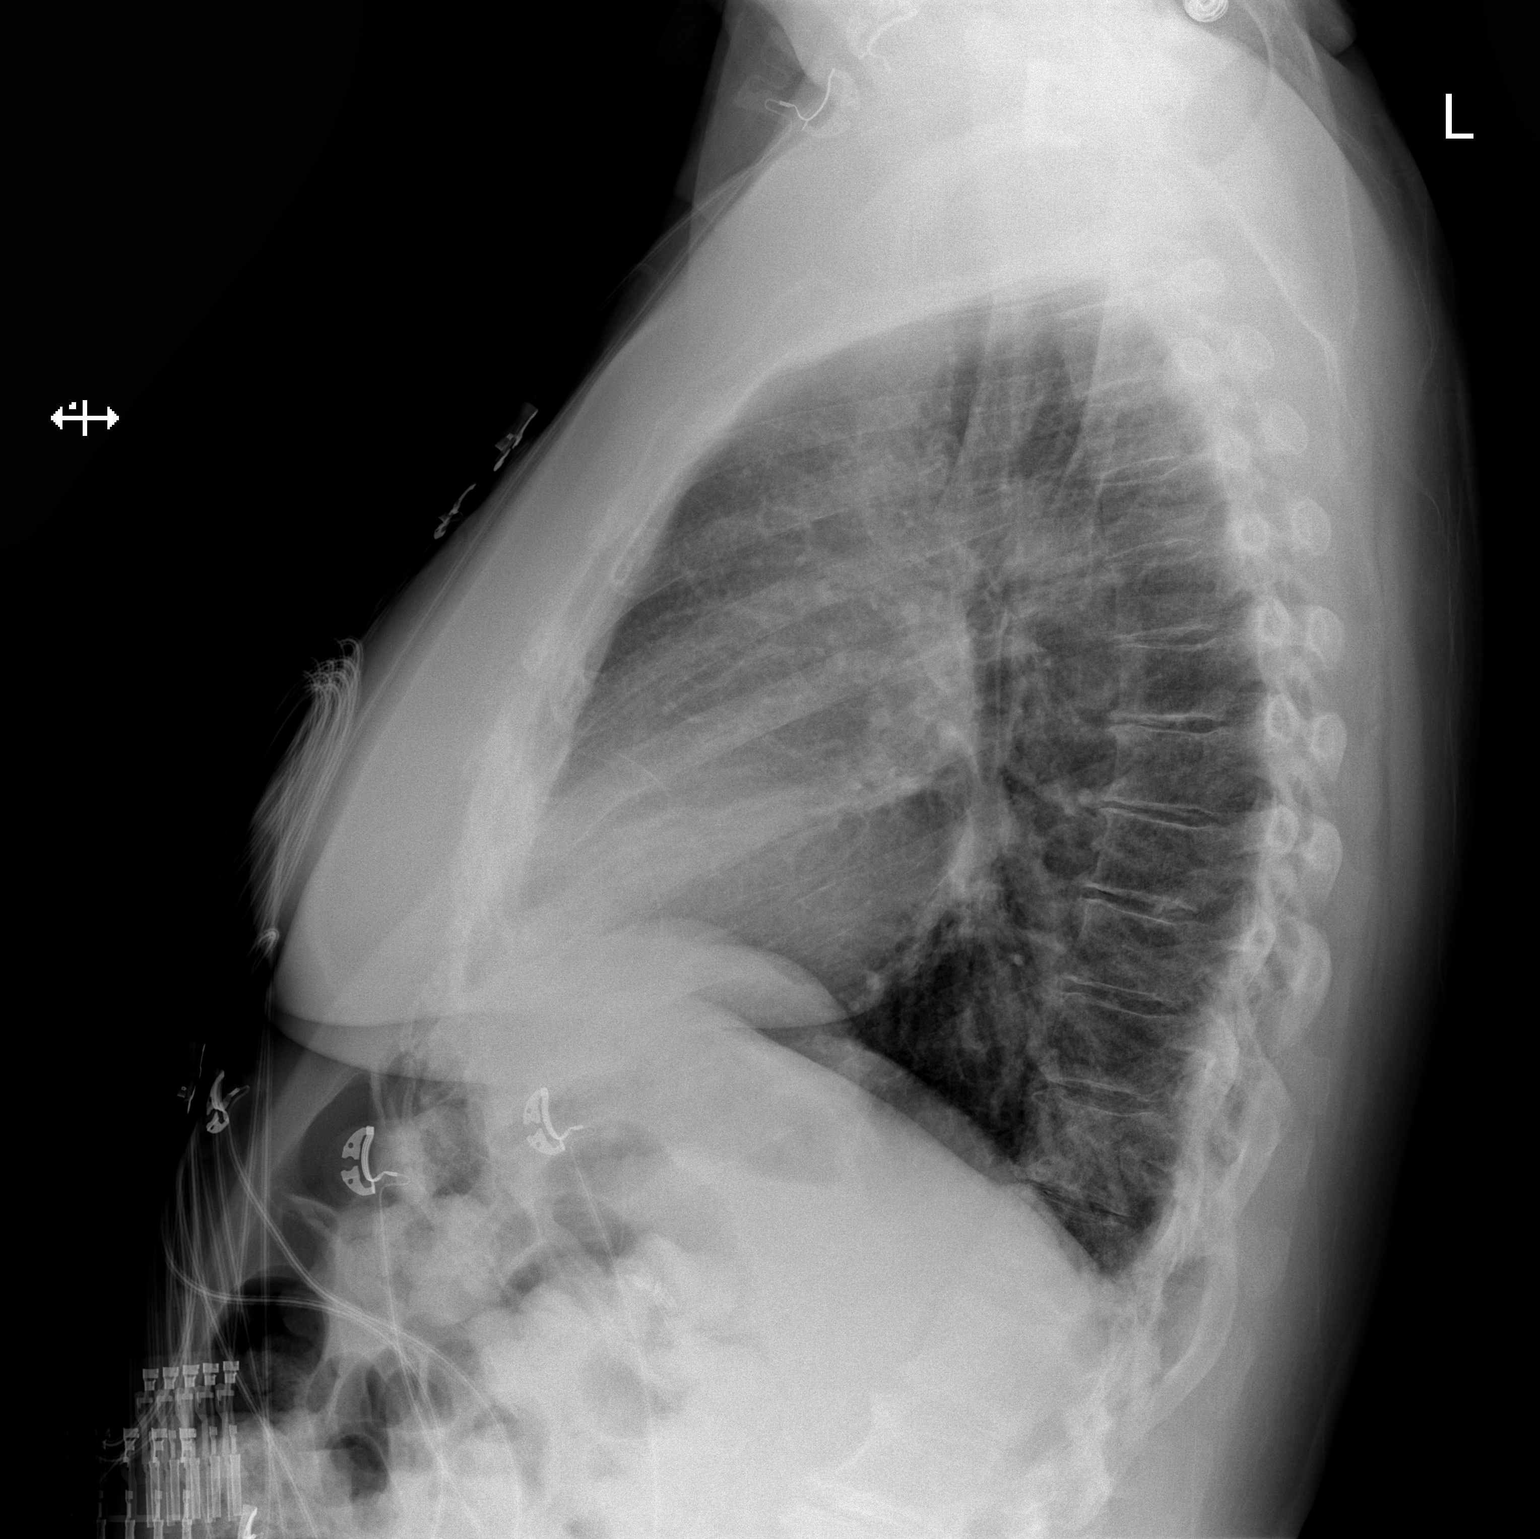

[2 of 2 positions shown; findings below may reference images not displayed]

FINDINGS: Patchy left infrahilar airspace disease. No pleural effusion. Normal
cardiomediastinal silhouette. No pneumothorax.
IMPRESSION: Streaky atelectasis or mild pneumonia at the lingula and left base.

## 2023-05-22 IMAGING — CR DG CHEST 2V
2 series · 2 of 2 positions shown · non-contrast
Comparison: November 16, 2020

CLINICAL DATA: sob

EXAM:
CHEST - 2 VIEW

[w chest pa]
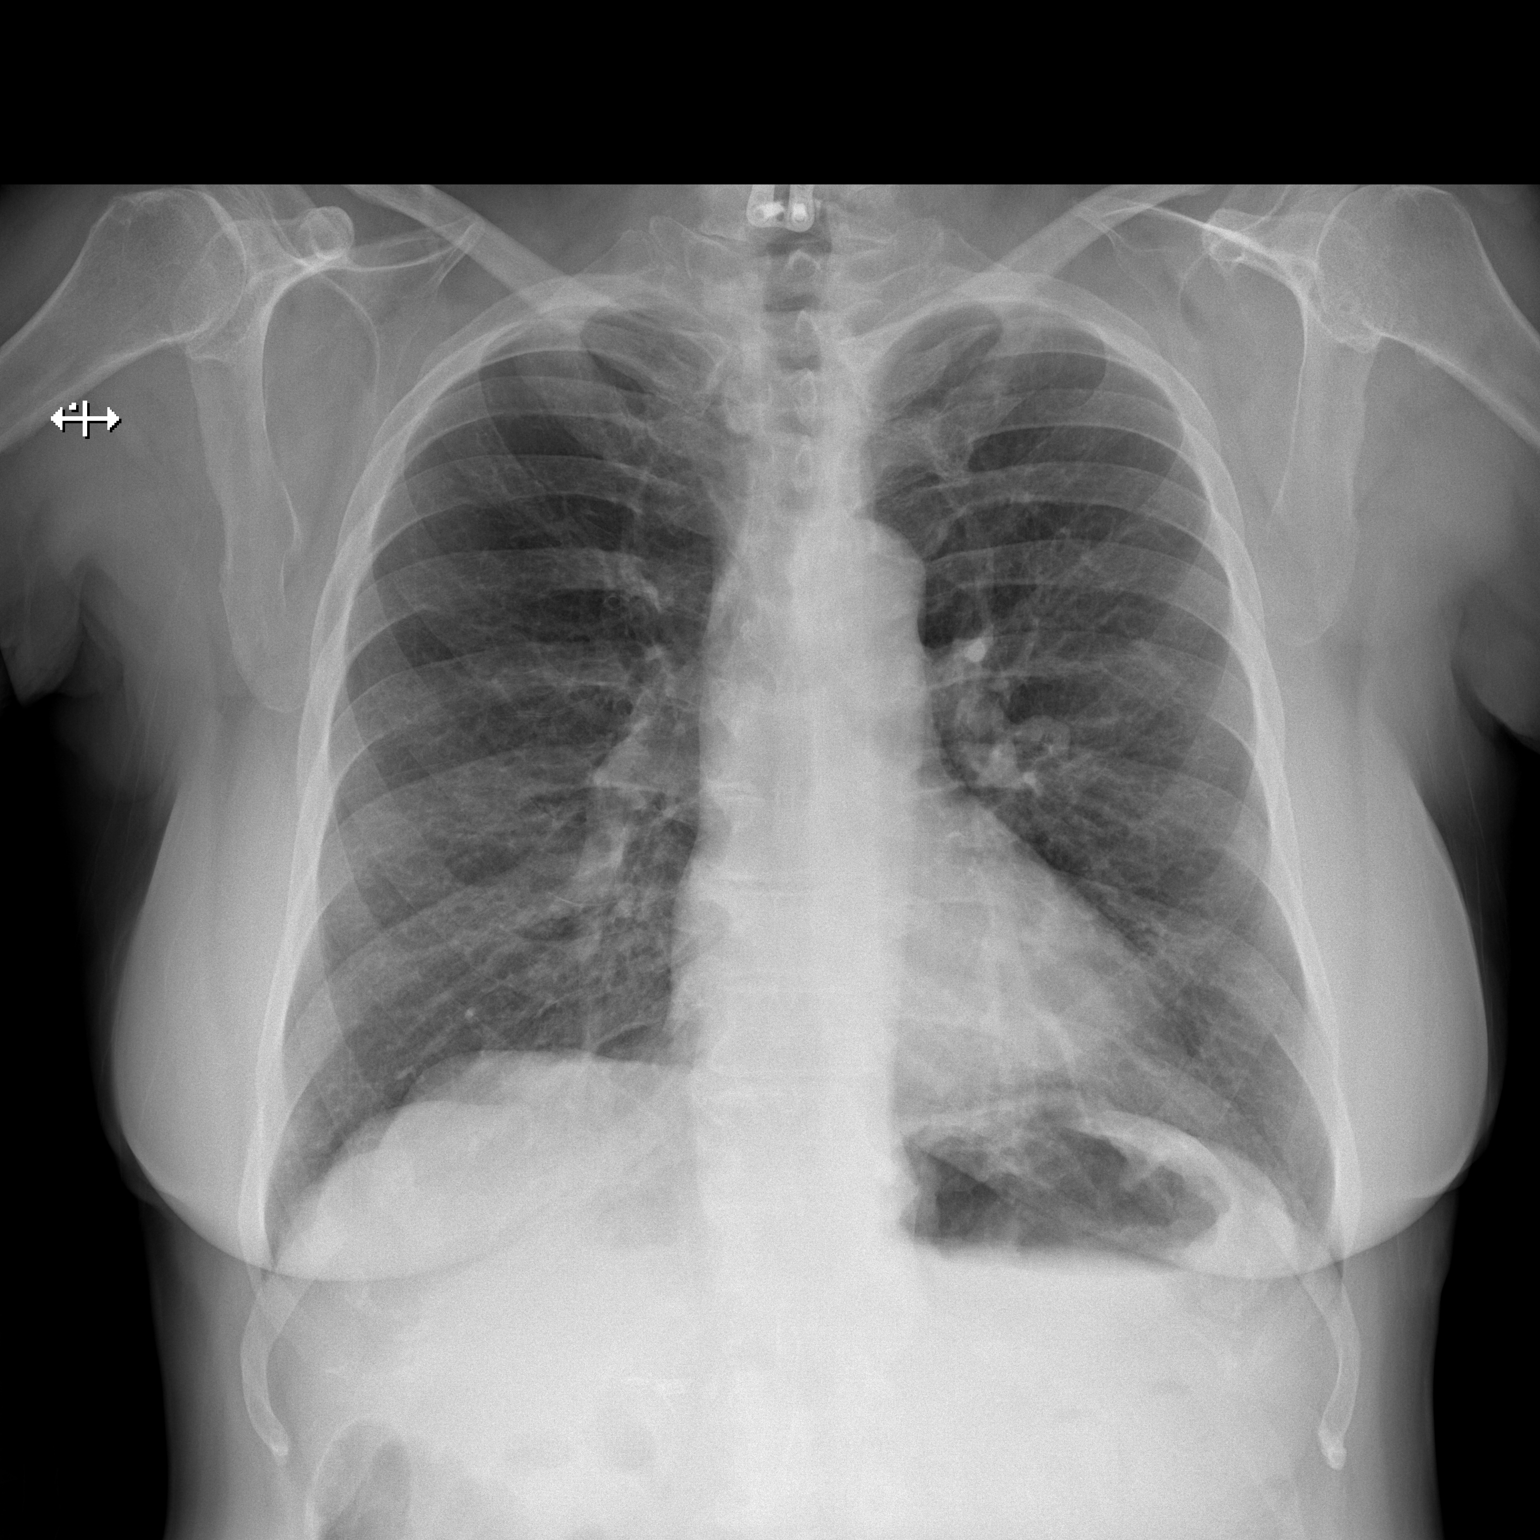

[w chest lat]
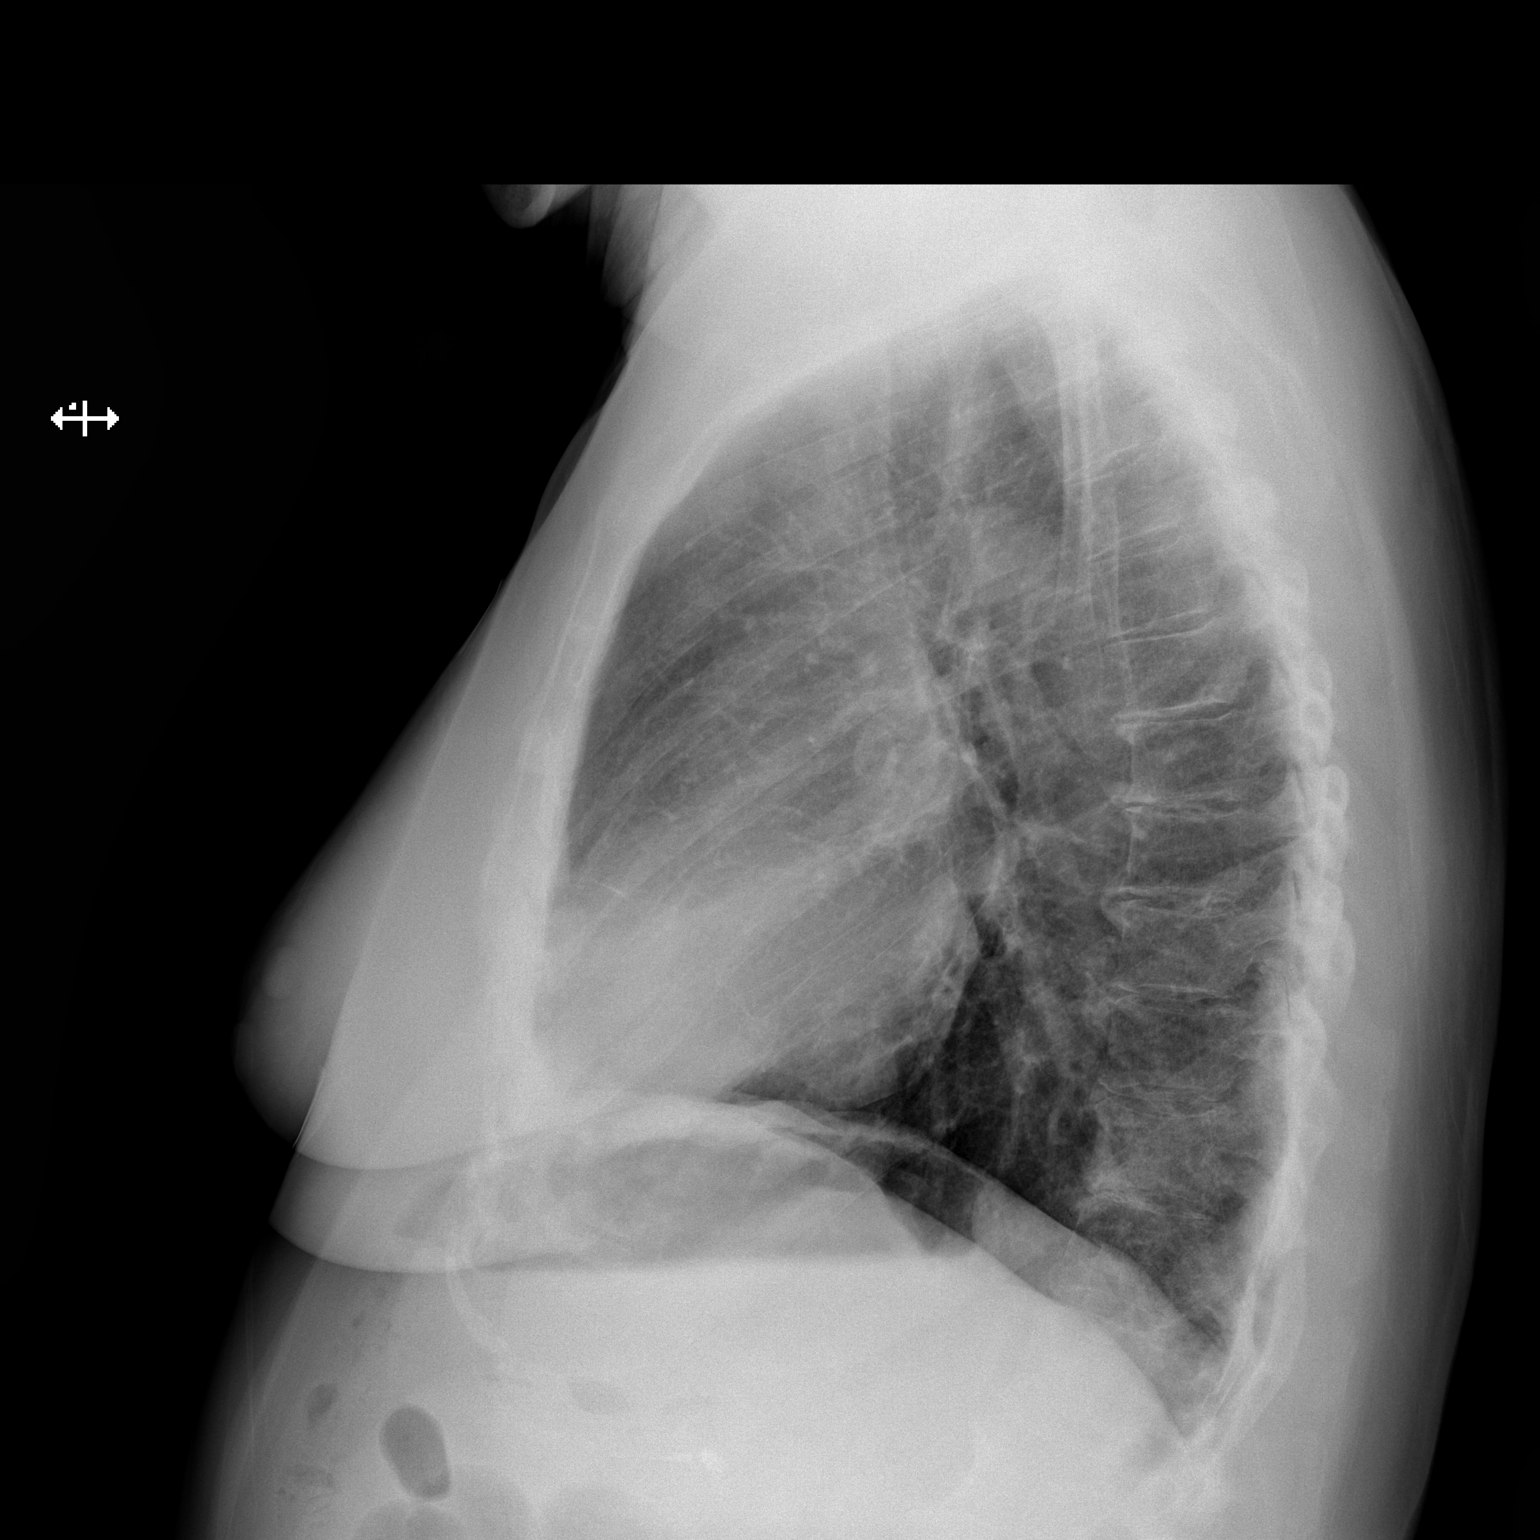

[2 of 2 positions shown; findings below may reference images not displayed]

FINDINGS: The cardiomediastinal silhouette is unchanged in contour.Decreased
conspicuity of previously described LEFT retrocardiac opacity. No
pleural effusion. No pneumothorax. No acute pleuroparenchymal
abnormality. Postsurgical changes of the RIGHT upper quadrant.
Status post ACDF. Multilevel degenerative changes of the thoracic
spine.
IMPRESSION: No acute cardiopulmonary abnormality. Decreased conspicuity of LEFT
retrocardiac opacity.

## 2023-06-07 ENCOUNTER — Ambulatory Visit
Admission: RE | Admit: 2023-06-07 | Discharge: 2023-06-07 | Disposition: A | Discharge status: Home / Self Care | Payer: Medicare Other | Source: Ambulatory Visit | Attending: Physician Assistant | Admitting: Physician Assistant

## 2023-06-07 DIAGNOSIS — Z1231 Encounter for screening mammogram for malignant neoplasm of breast: Secondary | ICD-10-CM

## 2023-06-09 ENCOUNTER — Other Ambulatory Visit: Payer: Self-pay | Admitting: Emergency Medicine

## 2023-06-09 DIAGNOSIS — Z122 Encounter for screening for malignant neoplasm of respiratory organs: Secondary | ICD-10-CM

## 2023-06-09 DIAGNOSIS — Z87891 Personal history of nicotine dependence: Secondary | ICD-10-CM

## 2023-07-05 ENCOUNTER — Inpatient Hospital Stay: Admission: RE | Admit: 2023-07-05 | Payer: Medicare Other | Source: Ambulatory Visit

## 2023-07-05 LAB — LAB REPORT - SCANNED: EGFR: 60

## 2023-07-28 IMAGING — MR MR CERVICAL SPINE W/O CM
4 of 5 series · 28 of 48 positions shown · non-contrast
Comparison: 12/17/2016 cervical spine CT

CLINICAL DATA: Cervical radiculopathy without red flag symptoms.
Pain radiating into the left shoulder.

EXAM:
MRI CERVICAL SPINE WITHOUT CONTRAST
TECHNIQUE: Multiplanar, multisequence MR imaging of the cervical spine was
performed. No intravenous contrast was administered.

[Series 4: T2 · sagittal · 3.0mm · 0.69mm/px · 6 of 19 slices shown (1 of 2)]
[im 1/19]
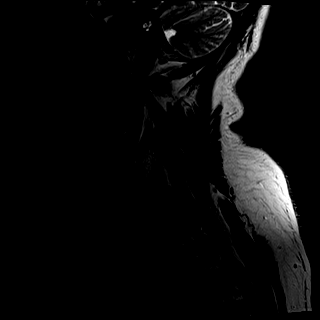
[im 4/19]
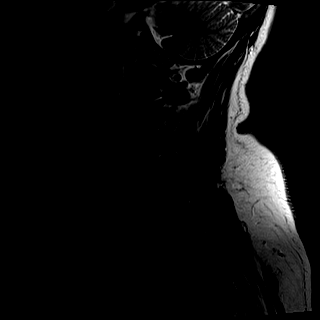
[im 8/19]
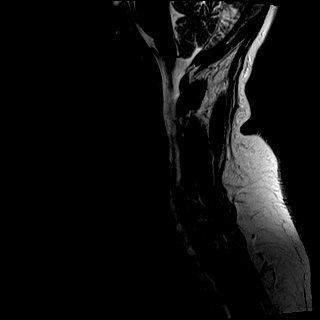
[im 11/19]
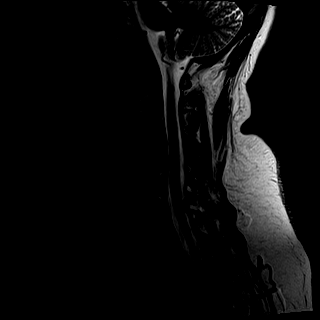
[im 15/19]
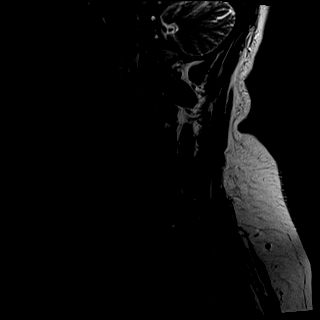
[im 19/19]
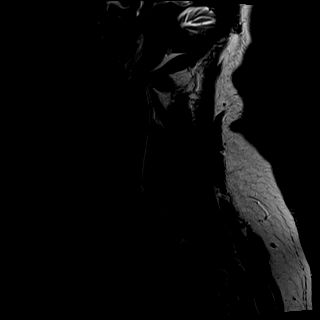

[Series 5: T1 · sagittal · 3.0mm · 0.43mm/px · 7 of 19 slices shown]
[im 1/19]
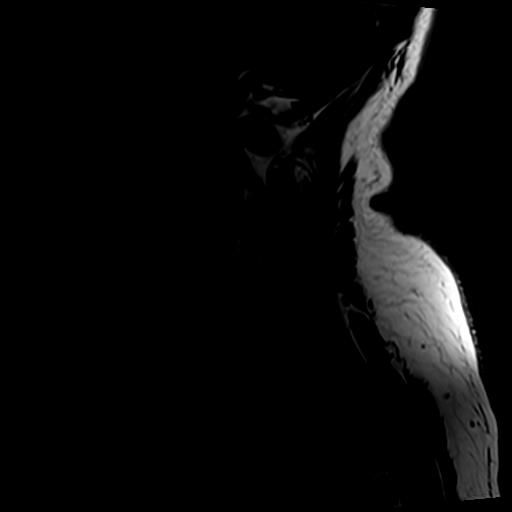
[im 4/19]
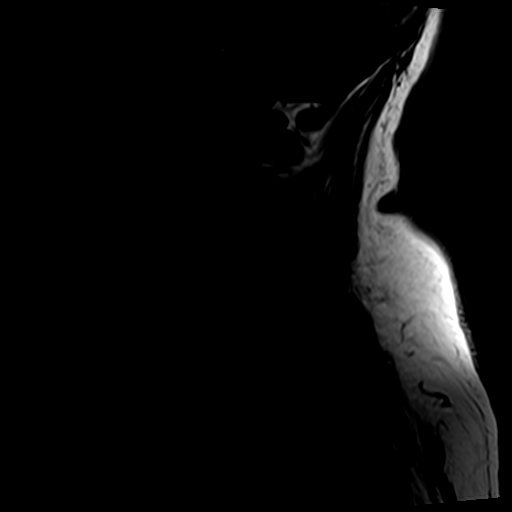
[im 7/19]
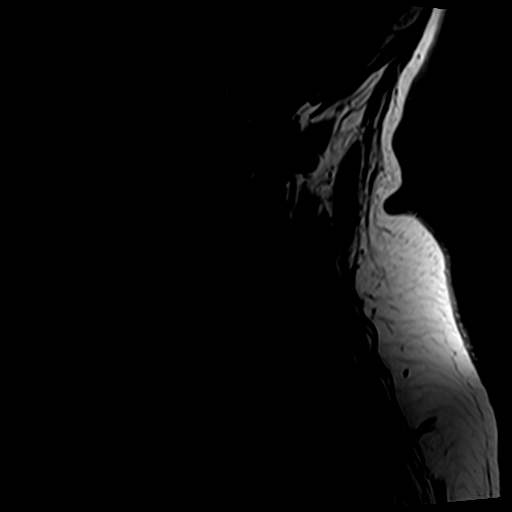
[im 10/19]
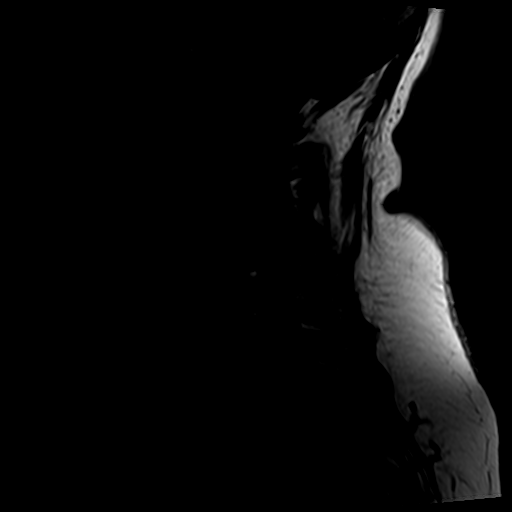
[im 13/19]
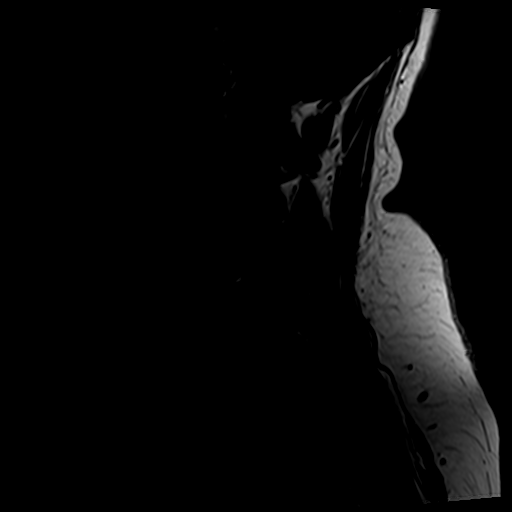
[im 16/19]
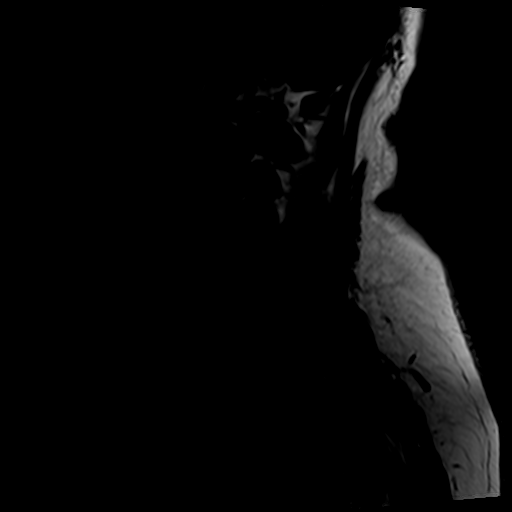
[im 19/19]
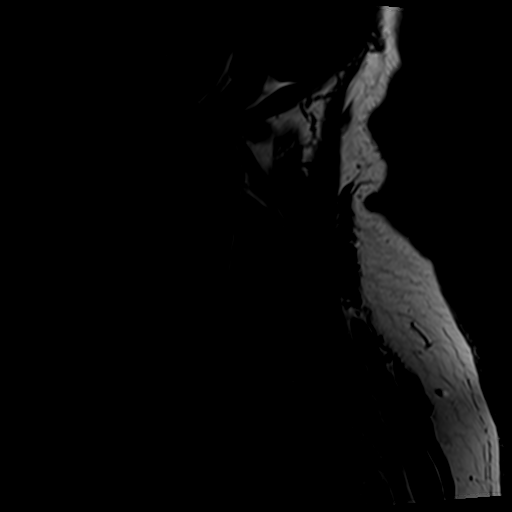

[Series 6: tir sag · sagittal · 3.0mm · 0.43mm/px · 7 of 19 slices shown]
[im 1/19]
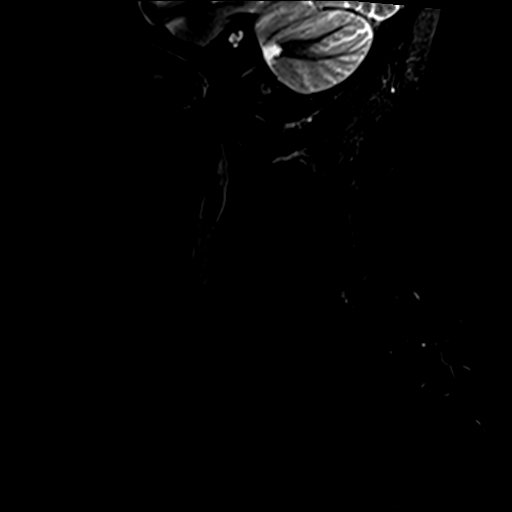
[im 4/19]
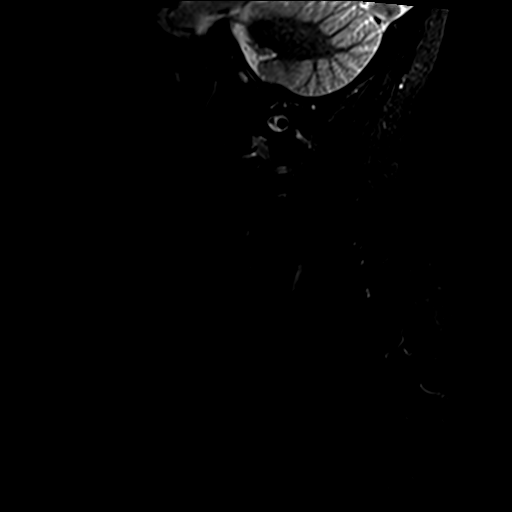
[im 7/19]
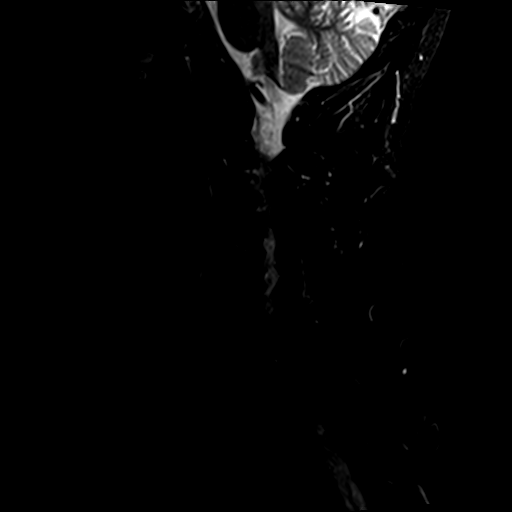
[im 10/19]
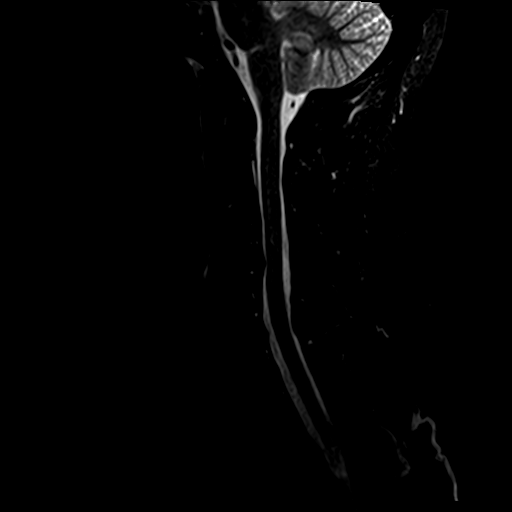
[im 13/19]
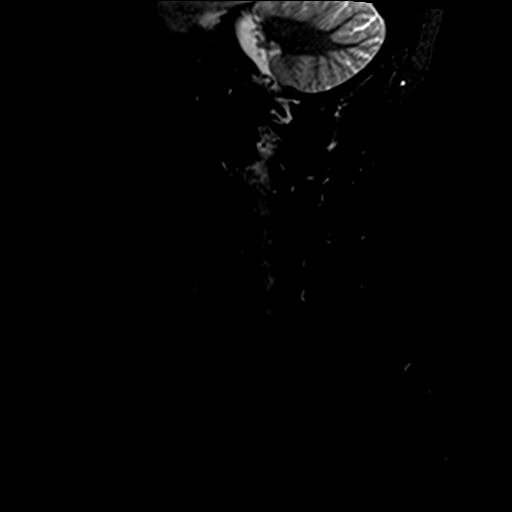
[im 16/19]
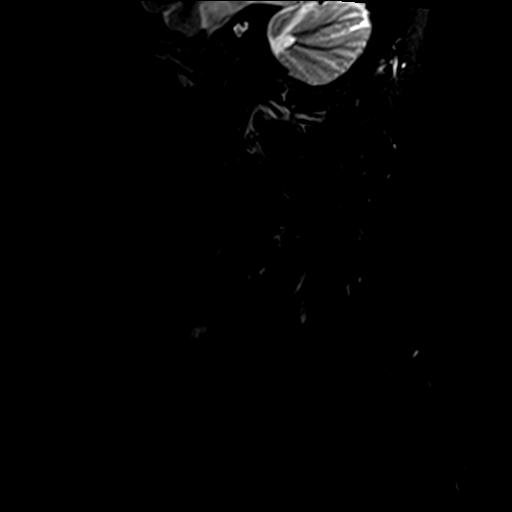
[im 19/19]
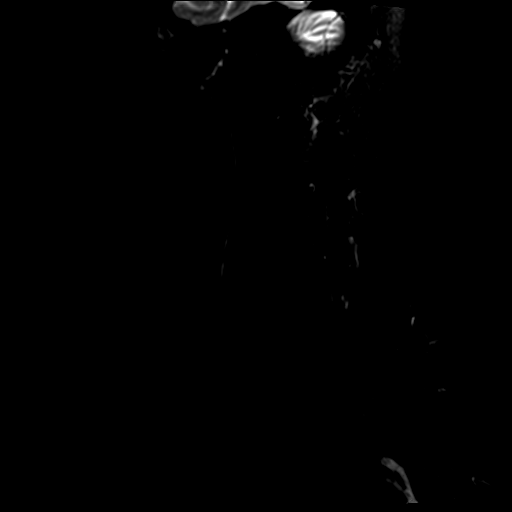

[Series 8: T2 · axial · 3.0mm · 0.78mm/px · z∈[-90,+44]mm · 8 of 37 slices shown (2 of 2)]
[im 1/37]
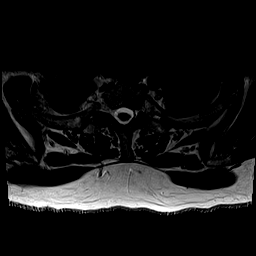
[im 6/37]
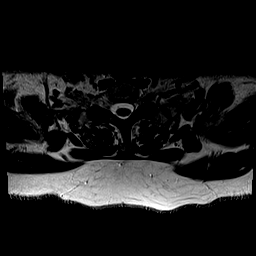
[im 12/37]
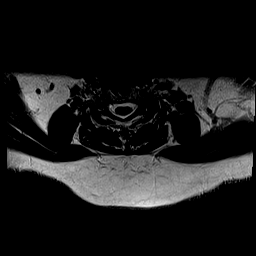
[im 17/37]
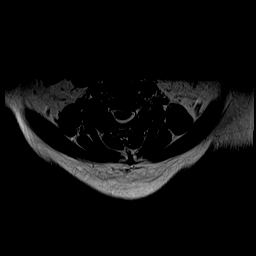
[im 20/37]
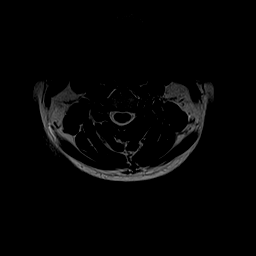
[im 25/37]
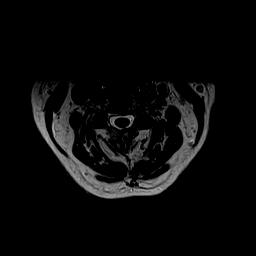
[im 31/37]
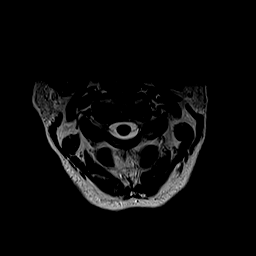
[im 37/37]
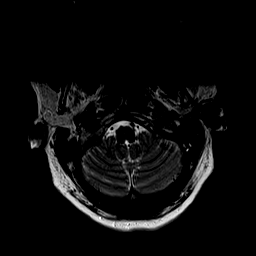

[28 of 48 positions shown; findings below may reference images not displayed]

FINDINGS: Alignment: Reversal of cervical lordosis.

Vertebrae: C5-6 ACDF with solid arthrodesis

Cord: Normal signal and morphology.

Posterior Fossa, vertebral arteries, paraspinal tissues: 2.7 cm
right thyroid nodule.

Disc levels:

C2-3: Minor facet spurring

C3-4: Mild uncovertebral and facet spurring. Chronic moderate left
foraminal narrowing

C4-5: Asymmetric right facet and uncovertebral spurring with mild or
moderate right foraminal narrowing.

C5-6: ACDF with solid arthrodesis and no impingement

C6-7: Minor facet spurring and disc bulging

C7-T1:Mild facet spurring
IMPRESSION: 1. C5-6 ACDF with solid arthrodesis and no impingement.
2. Uncovertebral and facet spurs cause moderate foraminal narrowing
on the left at C3-4 and mild-to-moderate narrowing on the right at
C4-5.
3. No convincing change from a 8161 cervical spine CT.
[DATE] cm right thyroid nodule. Recommend thyroid US (ref: [HOSPITAL]. [DATE]): 143-50).

## 2023-08-02 ENCOUNTER — Ambulatory Visit
Admission: RE | Admit: 2023-08-02 | Discharge: 2023-08-02 | Disposition: A | Discharge status: Home / Self Care | Source: Ambulatory Visit | Attending: Acute Care | Admitting: Acute Care

## 2023-08-02 DIAGNOSIS — Z122 Encounter for screening for malignant neoplasm of respiratory organs: Secondary | ICD-10-CM

## 2023-08-02 DIAGNOSIS — Z87891 Personal history of nicotine dependence: Secondary | ICD-10-CM

## 2023-08-10 ENCOUNTER — Other Ambulatory Visit: Payer: Self-pay | Admitting: *Deleted

## 2023-08-10 DIAGNOSIS — Z87891 Personal history of nicotine dependence: Secondary | ICD-10-CM

## 2023-08-10 DIAGNOSIS — Z122 Encounter for screening for malignant neoplasm of respiratory organs: Secondary | ICD-10-CM

## 2023-10-04 ENCOUNTER — Ambulatory Visit: Admitting: Physician Assistant

## 2023-10-22 ENCOUNTER — Other Ambulatory Visit: Payer: Self-pay

## 2023-10-22 ENCOUNTER — Encounter: Payer: Self-pay | Admitting: Physical Therapy

## 2023-10-22 ENCOUNTER — Ambulatory Visit: Attending: Physician Assistant | Admitting: Physical Therapy

## 2023-10-22 DIAGNOSIS — M5442 Lumbago with sciatica, left side: Secondary | ICD-10-CM | POA: Diagnosis present

## 2023-10-22 DIAGNOSIS — M6281 Muscle weakness (generalized): Secondary | ICD-10-CM | POA: Insufficient documentation

## 2023-10-22 DIAGNOSIS — M5441 Lumbago with sciatica, right side: Secondary | ICD-10-CM | POA: Diagnosis present

## 2023-10-22 NOTE — Therapy (Signed)
 OUTPATIENT PHYSICAL THERAPY THORACOLUMBAR EVALUATION   Patient Name: Krista Austin MRN: [id number] DOB:04-05-1946, 78 y.o., female Today's Date: 10/22/2023  END OF SESSION:  PT End of Session - 10/22/23 1007     Visit Number 1    Date for PT Re-Evaluation 12/17/23    Authorization Type Medicare    PT Start Time 1006    PT Stop Time 1055    PT Time Calculation (min) 49 min    Activity Tolerance Patient tolerated treatment well          Past Medical History:  Diagnosis Date   A-fib (HCC)    Adenomatous polyp    Arthritis    mostly fingers (10/07/2016)   At high risk for falls    Celiac disease    I do not have the disease; 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 (10/07/2016)   Chronic back pain    worse in my neck; lower back also affected (10/07/2016)   Chronic cholecystitis 10/16/2016   Complication of anesthesia    never fully regained my taste after my cervical fusion; I don't take much RX so I'm very sensitive to any sedation (10/07/2016)   Concussion    X2   DDD (degenerative disc disease), cervical    lumbar   Depression    hx; take Prozac  to keep me stable (10/07/2016)   Dizziness    caused by compression of cervical disc   Family history of adverse reaction to anesthesia    daughters get PONV; one daughter breaks out severely; grandson got suspended in a state of anxiousness after kidney surgery; had to be held for hours   GERD (gastroesophageal reflux disease)    Hyperlipidemia    OSA on CPAP    Pneumonia    once as an adult (10/07/2016)   Thyroid nodule    Type II diabetes mellitus (HCC)    controlled by diet and exercide   Past Surgical History:  Procedure Laterality Date   ANTERIOR CERVICAL DECOMP/DISCECTOMY FUSION  2015   BACK SURGERY     CARPAL TUNNEL RELEASE Bilateral    CATARACT EXTRACTION, BILATERAL Bilateral 09/10/2014 - 09/24/2014   by Ronni Colace   CHOLECYSTECTOMY N/A 10/16/2016   Procedure: LAPAROSCOPIC CHOLECYSTECTOMY;  Surgeon: Dareen Ebbing, MD;  Location: [facility] OR;  Service: General;  Laterality: N/A;   LAPAROSCOPIC CHOLECYSTECTOMY  10/16/2016   LUMBAR DISC SURGERY  2007   TONSILLECTOMY  05/05/1983   TUBAL LIGATION  02/09/1979   Patient Active Problem List   Diagnosis Date Noted   Closed fracture of 9th & 10th ribs of right side 12/17/2016   Pneumothorax on right 12/17/2016   Lumbar degenerative disc disease 12/17/2016   Essential hypertension 12/17/2016   Dyslipidemia 10/07/2016   Pulmonary nodule 10/07/2016   Abnormal CXR    Chest pain 10/06/2016   S/P lumbar spinal fusion 08/05/2016   Obstructive sleep apnea syndrome, mild 10/27/2013   Controlled type 2 diabetes mellitus without complication, without long-term current use of insulin (HCC) 06/19/2011   GAD (generalized anxiety disorder) 11/01/2009    PCP: Valere Gata PA-C  REFERRING PROVIDER: Aldine Humphreys PA  REFERRING DIAG: M54.41 lumbago with sciatica, right side  Rationale for Evaluation and Treatment: Rehabilitation  THERAPY DIAG:  Back pain; weakness ONSET DATE: June 2   SUBJECTIVE:  SUBJECTIVE STATEMENT: Worst sciatic flare up I've ever had early in the month.  Started with pain in calf muscles bil at night then progress to low tailbone area and both buttocks.  Right worse than left.  I couldn't sit, lie or bend over.  Dr. Lamon Pillow called in a round of steroids.  Intermittently numbness down to foot.    PERTINENT HISTORY:  spinal fusion 2018; chronic neck pain; bil carpal tunnel surgery (wears night splints); DM; HTN; chronic kidney disease; chronic dizziness  PAIN:  Are you having pain? Yes NPRS scale: 2/10  (was debilitating prior to steroids) Pain location: right low sacral pain,bil buttocks Pain orientation: Right and Left  PAIN TYPE: burning Pain  description: constant  Aggravating factors: vacuuming, sweeping, bending over, lifting, changing the sheets Relieving factors: steroid dose pack, increased Gabapentin , wears back brace with activity, take breaks, lie in recliner  PRECAUTIONS: None   WEIGHT BEARING RESTRICTIONS: No  FALLS:  Has patient fallen in last 6 months? No  LIVING ENVIRONMENT: Lives with: lives with their family Lives in: House/apartment  OCCUPATION: retired  PLOF: Independent  PATIENT GOALS: get a handle on this; don't want it to become a big problem again    OBJECTIVE:  Note: Objective measures were completed at Evaluation unless otherwise noted.  DIAGNOSTIC FINDINGS:  No new scans of lumbar  2020: IMPRESSION: Mild spinal stenosis L2-3 with moderate subarticular stenosis bilaterally, right greater than left   Left laminectomy L3-4. Moderate subarticular and foraminal stenosis bilaterally unchanged   Left foraminal disc protrusion L4-5. Severe subarticular and foraminal stenosis on the left has progressed. Mild spinal stenosis. Moderate subarticular foraminal stenosis on the right   Severe foraminal encroachment on the left at L5-S1 due to spurring. No change from the prior study.  PATIENT SURVEYS:  Modified Oswestry 20% (pt completed first page only)   COGNITION: Overall cognitive status: Within functional limits for tasks assessed      POSTURE: decreased lumbar lordosis  PALPATION: Tender points in bil gluteals and bil piriformis  LUMBAR ROM:   AROM eval  Flexion 50% limited  Extension 75% limited  Right lateral flexion 25% limited  Left lateral flexion 25% limited  Right rotation   Left rotation    (Blank rows = not tested)  TRUNK STRENGTH:  Decreased activation of transverse abdominus muscles; abdominals 4-/5; decreased activation of lumbar multifidi; trunk extensors 4-/5    LOWER EXTREMITY MMT:  bil hip abduction 4-/5 with pelvic drop noted with single leg standing 10  sec  FUNCTIONAL TESTS:  Able to rise from standard chair without UE assist Able to do partial squat to pick up small item from the floor  GAIT: decreased stride length (dec hip flexor lengths)    TREATMENT DATE: 10/22/23 evaluation    Trigger Point Dry Needling Consent form signed and given to the front desk Initial Treatment: Pt instructed on Dry Needling rational, procedures, and possible side effects. Pt instructed to expect mild to moderate muscle soreness later in the day and/or into the next day.  Pt instructed in methods to reduce muscle soreness. Patient verbalized understanding of these instructions and education.  Patient Verbal Consent Given: Yes Education Handout Provided: Previously Provided pt has had DN in the past at this faciltiy Muscles Treated: bil gluteals and piriformis in sidelying  Electrical Stimulation Performed: No Treatment Response/Outcome: improved soft tissue mobility; twitch responses on right which is a good prognostic indicator     PATIENT EDUCATION:  Education details: Educated patient on anatomy  and physiology of current symptoms, prognosis, plan of care as well as initial self care strategies to promote recovery Person educated: Patient Education method: Explanation Education comprehension: verbalized understanding  HOME EXERCISE PROGRAM: To be started  ASSESSMENT:  CLINICAL IMPRESSION: Patient is a 78 y.o. female who was seen today for physical therapy evaluation and treatment for back pain with radicular symptoms bilaterally (right worse than left).  The pain is in both buttocks, lower leg with intermittent numbness in the dorsal aspect of her feet.  Symptoms began for no apparent reason on June 2nd with extreme difficulty bending, walking and sitting.  She responded well to a round of steroids but continues to have difficulty with vacuuming, sweeping and push/pull movements.  The patient would benefit from PT to address trunk range of motion  deficits, strength asymmetries in lumbo/pelvic and hip regions and pain levels that are currently affecting activities of daily living at home. She is the primary caregiver for her son with Downs Syndrome.   OBJECTIVE IMPAIRMENTS: decreased activity tolerance, difficulty walking, decreased ROM, decreased strength, increased fascial restrictions, impaired perceived functional ability, and pain.   ACTIVITY LIMITATIONS: lifting, bending, standing, squatting, and caring for others  PARTICIPATION LIMITATIONS: meal prep, cleaning, laundry, and community activity  PERSONAL FACTORS: 1-2 comorbidities: prior spinal pain history, diabetes are also affecting patient's functional outcome.   REHAB POTENTIAL: Good  CLINICAL DECISION MAKING: Stable/uncomplicated  EVALUATION COMPLEXITY: Low   GOALS: Goals reviewed with patient? Yes  SHORT TERM GOALS: Target date: 11/19/2023   The patient will demonstrate knowledge of basic self care strategies and exercises to promote healing and recovery of function  Baseline: Goal status: INITIAL  2.  Pt will report at least 30% improvement in symptoms with light lifting, pushing and pulling tasks, sweeping and vacuuming  Baseline:  Goal status: INITIAL  3.  The patient will have improved hip strength to at least 4/5 needed for standing, walking longer distances and descending stairs at home and in the community  Baseline:  Goal status: INITIAL  4.  The patient will have improved trunk flexor and extensor muscle strength to at least 4/5 needed for lifting light to medium weight objects such as grocery bags  Baseline:  Goal status: INITIAL   LONG TERM GOALS: Target date: 12/17/2023   The patient will be independent in a safe self progression of a home exercise program to promote further recovery of function  Baseline:  Goal status: INITIAL  2.  Pt will report at least 60% improvement in symptoms with light to medium lifting, changing the sheets,  sweeping and vacumning  Baseline:  Goal status: INITIAL  3.  The patient will have improved hip strength to at least 4+/5 needed for standing, walking longer distances and descending stairs at home and in the community  Baseline:  Goal status: INITIAL  4.  The patient will have improved trunk flexor and extensor muscle strength to at least 4+/5 needed for lifting medium weight objects such as laundry and luggage  Baseline:  Goal status: INITIAL  5.  Modified Oswestry functional outcome measure score improved to    16 % indicating improved function with ADLS with less pain.   Baseline:  Goal status: INITIAL    PLAN:  PT FREQUENCY: 1x/week  PT DURATION: 8 weeks  PLANNED INTERVENTIONS: 97164- PT Re-evaluation, 97110-Therapeutic exercises, 97530- Therapeutic activity, [id number]- Neuromuscular re-education, 97535- Self Care, 40981- Manual therapy, [id number]- Aquatic Therapy, X9147- Electrical stimulation (unattended), [id number]- Electrical stimulation (manual), [id number]- Ultrasound, [id number]- Traction (  mechanical), 65784- Ionotophoresis 4mg /ml Dexamethasone , 20560 (1-2 muscles), 20561 (3+ muscles)- Dry Needling, Patient/Family education, Taping, Joint mobilization, Spinal mobilization, Cryotherapy, and Moist heat.  PLAN FOR NEXT SESSION: assess response to DN (may add ES);  pelvic tilts; prone glute squeezes; Single leg modified RDL; my benefit from PSFS scale for functional activities   Darien Eden, PT 10/22/23 12:27 PM Phone: [phone] Fax: [phone]

## 2023-10-27 ENCOUNTER — Ambulatory Visit: Payer: Self-pay | Admitting: Physical Therapy

## 2023-11-08 NOTE — Progress Notes (Deleted)
 Cardiology Office Note:  .   Date:  11/08/2023  ID:  Krista Austin, DOB Mar 11, 1946, MRN [id number] PCP: Krista Austin  [facility] Providers Cardiologist:  Krista Parchment, MD { Click to update primary MD,subspecialty MD or APP then REFRESH:1}   History of Present Illness: .   Krista Austin is a 78 y.o. female with history of DM2, HLD, coronary art calcification, COPD, family history of heart disease, 50 pack years of smoking but quit. cardiac PET after abnormal EKG and significant coronary calcifications were noted.  Cardiac PET took place on 05/12/2022-Fixed inferior perfusion defect with normal wall motion and normal RCA territory flow reserve, consistent with artifact. Severe coronary calcifications, but no TID or drop in EF with stress to suggest multivessel CAD. In addition, flow reserves are normal globally and in each coronary distribution. Low risk study.     ROS: ***  Studies Reviewed: Krista Austin         Prior CV Studies: {Select studies to display:26339}  PET 05/2022 Fixed inferior perfusion defect with normal wall motion and normal RCA territory flow reserve, consistent with artifact.  Severe coronary calcifications, but no TID or drop in EF with stress to suggest multivessel CAD.  In addition, flow reserves are normal globally and in each coronary distribution.  Low risk study.   LV perfusion is abnormal. There is no evidence of ischemia. There is no evidence of infarction. Defect 1: There is a medium defect with mild reduction in uptake present in the apical to basal inferior location(s) that is fixed. There is normal wall motion in the defect area. Consistent with artifact caused by bowel tracer uptake and diaphragmatic attenuation.   Rest left ventricular function is normal. Stress left ventricular function is normal. End diastolic cavity size is normal. End systolic cavity size is normal.   Myocardial blood flow was computed to be 0.86ml/g/min at rest and 2.41ml/g/min at  stress. Global myocardial blood flow reserve was 3.74 and was normal.   Coronary calcium  was present on the attenuation correction CT images. Severe coronary calcifications were present. Coronary calcifications were present in the left anterior descending artery, left circumflex artery and right coronary artery distribution(s).   The study is normal. The study is low risk.   Electronically signed by Krista Nanas, MD    Risk Assessment/Calculations:   {Does this patient have ATRIAL FIBRILLATION?:[id number]} No BP recorded.  {Refresh Note OR Click here to enter BP  :1}***       Physical Exam:   VS:  There were no vitals taken for this visit.   Orhtostatics: No data found. Wt Readings from Last 3 Encounters:  09/21/22 146 lb (66.2 kg)  03/17/22 151 lb (68.5 kg)  11/28/20 155 lb (70.3 kg)    GEN: Well nourished, well developed in no acute distress NECK: No JVD; No carotid bruits CARDIAC: ***RRR, no murmurs, rubs, gallops RESPIRATORY:  Clear to auscultation without rales, wheezing or rhonchi  ABDOMEN: Soft, non-tender, non-distended EXTREMITIES:  No edema; No deformity   ASSESSMENT AND PLAN: .   Multivessel coronary artery disease -Cardiac PET and echo were reassuring.  No evidence of blood flow issues or 3 times daily. - Multivessel coronary artery disease/calcified plaque quite diffuse seen on CT scan personally reviewed and interpreted.  This includes the LAD RCA and circumflex artery. Her brother had a widow maker lesion.  She has a strong family history of coronary artery disease. - She is currently on aspirin  81 mg as well  as Crestor  10 mg.  Excellent.  Her LDL most recently was in the 62s.  Fantastic.  I am comfortable with her staying with this Crestor  10 mg dose.  There was some concern about potential memory impairment recently.  Her mother had vascular dementia.  Her mother had an autopsy.  Continue with current plan.  HLD  DM2       {Are you ordering a CV  Procedure (e.g. stress test, cath, DCCV, TEE, etc)?   Press F2        :[id number]}  Dispo: ***  Signed, Olivia Pavy, PA-C

## 2023-11-15 ENCOUNTER — Ambulatory Visit: Admitting: Physician Assistant

## 2023-11-17 ENCOUNTER — Ambulatory Visit: Attending: Physician Assistant | Admitting: Physical Therapy

## 2023-11-17 DIAGNOSIS — M6281 Muscle weakness (generalized): Secondary | ICD-10-CM | POA: Diagnosis present

## 2023-11-17 DIAGNOSIS — M5441 Lumbago with sciatica, right side: Secondary | ICD-10-CM | POA: Diagnosis present

## 2023-11-17 NOTE — Therapy (Signed)
 OUTPATIENT PHYSICAL THERAPY THORACOLUMBAR PROGRESS NOTE   Patient Name: Krista Austin MRN: [id number] DOB:08-Apr-1946, 78 y.o., female Today's Date: 11/17/2023  END OF SESSION:  PT End of Session - 11/17/23 1019     Visit Number 2    Date for PT Re-Evaluation 12/17/23    Authorization Type Medicare ABN signed for traditional Medicare 7/16    PT Start Time 1015    PT Stop Time 1100    PT Time Calculation (min) 45 min    Activity Tolerance Patient tolerated treatment well          Past Medical History:  Diagnosis Date   A-fib (HCC)    Adenomatous polyp    Arthritis    mostly fingers (10/07/2016)   At high risk for falls    Celiac disease    I do not have the disease; 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 (10/07/2016)   Chronic back pain    worse in my neck; lower back also affected (10/07/2016)   Chronic cholecystitis 10/16/2016   Complication of anesthesia    never fully regained my taste after my cervical fusion; I don't take much RX so I'm very sensitive to any sedation (10/07/2016)   Concussion    X2   DDD (degenerative disc disease), cervical    lumbar   Depression    hx; take Prozac  to keep me stable (10/07/2016)   Dizziness    caused by compression of cervical disc   Family history of adverse reaction to anesthesia    daughters get PONV; one daughter breaks out severely; grandson got suspended in a state of anxiousness after kidney surgery; had to be held for hours   GERD (gastroesophageal reflux disease)    Hyperlipidemia    OSA on CPAP    Pneumonia    once as an adult (10/07/2016)   Thyroid nodule    Type II diabetes mellitus (HCC)    controlled by diet and exercide   Past Surgical History:  Procedure Laterality Date   ANTERIOR CERVICAL DECOMP/DISCECTOMY FUSION  2015   BACK SURGERY     CARPAL TUNNEL RELEASE Bilateral    CATARACT EXTRACTION, BILATERAL Bilateral 09/10/2014 - 09/24/2014   by Evalene Raw   CHOLECYSTECTOMY N/A 10/16/2016   Procedure:  LAPAROSCOPIC CHOLECYSTECTOMY;  Surgeon: Belinda Cough, MD;  Location: [facility] OR;  Service: General;  Laterality: N/A;   LAPAROSCOPIC CHOLECYSTECTOMY  10/16/2016   LUMBAR DISC SURGERY  2007   TONSILLECTOMY  05/05/1983   TUBAL LIGATION  02/09/1979   Patient Active Problem List   Diagnosis Date Noted   Closed fracture of 9th & 10th ribs of right side 12/17/2016   Pneumothorax on right 12/17/2016   Lumbar degenerative disc disease 12/17/2016   Essential hypertension 12/17/2016   Dyslipidemia 10/07/2016   Pulmonary nodule 10/07/2016   Abnormal CXR    Chest pain 10/06/2016   S/P lumbar spinal fusion 08/05/2016   Obstructive sleep apnea syndrome, mild 10/27/2013   Controlled type 2 diabetes mellitus without complication, without long-term current use of insulin (HCC) 06/19/2011   GAD (generalized anxiety disorder) 11/01/2009    PCP: Nena Knee PA-C  REFERRING PROVIDER: Juliane Che PA  REFERRING DIAG: M54.41 lumbago with sciatica, right side  Rationale for Evaluation and Treatment: Rehabilitation  THERAPY DIAG:  Back pain; weakness ONSET DATE: June 2   SUBJECTIVE:  SUBJECTIVE STATEMENT: The DN helped a lot last time!  Pt reports her son has been hospitalized with a respiratory infection and is now home with portable O2. As his primary caregiver, she has been providing round the clock monitoring and assistance.  She is surprised her pain was not worsened by sleeping in the chair at the hospital.  PERTINENT HISTORY:  spinal fusion 2018; chronic neck pain; bil carpal tunnel surgery (wears night splints); DM; HTN; chronic kidney disease; chronic dizziness  PAIN:  Are you having pain? Yes NPRS scale: 3-4/10   Pain location: right low sacral pain,bil buttocks Pain orientation: Right and Left  PAIN  TYPE: burning Pain description: constant  Aggravating factors: vacuuming, sweeping, bending over, lifting, changing the sheets Relieving factors: steroid dose pack, increased Gabapentin , wears back brace with activity, take breaks, lie in recliner  PRECAUTIONS: None   WEIGHT BEARING RESTRICTIONS: No  FALLS:  Has patient fallen in last 6 months? No  LIVING ENVIRONMENT: Lives with: lives with their family Lives in: House/apartment  OCCUPATION: retired  PLOF: Independent  PATIENT GOALS: get a handle on this; don't want it to become a big problem again    OBJECTIVE:  Note: Objective measures were completed at Evaluation unless otherwise noted.  DIAGNOSTIC FINDINGS:  No new scans of lumbar  2020: IMPRESSION: Mild spinal stenosis L2-3 with moderate subarticular stenosis bilaterally, right greater than left   Left laminectomy L3-4. Moderate subarticular and foraminal stenosis bilaterally unchanged   Left foraminal disc protrusion L4-5. Severe subarticular and foraminal stenosis on the left has progressed. Mild spinal stenosis. Moderate subarticular foraminal stenosis on the right   Severe foraminal encroachment on the left at L5-S1 due to spurring. No change from the prior study.  PATIENT SURVEYS:  Modified Oswestry 20% (pt completed first page only)   COGNITION: Overall cognitive status: Within functional limits for tasks assessed      POSTURE: decreased lumbar lordosis  PALPATION: Tender points in bil gluteals and bil piriformis  LUMBAR ROM:   AROM eval  Flexion 50% limited  Extension 75% limited  Right lateral flexion 25% limited  Left lateral flexion 25% limited  Right rotation   Left rotation    (Blank rows = not tested)  TRUNK STRENGTH:  Decreased activation of transverse abdominus muscles; abdominals 4-/5; decreased activation of lumbar multifidi; trunk extensors 4-/5    LOWER EXTREMITY MMT:  bil hip abduction 4-/5 with pelvic drop noted with  single leg standing 10 sec  FUNCTIONAL TESTS:  Able to rise from standard chair without UE assist Able to do partial squat to pick up small item from the floor  GAIT: decreased stride length (dec hip flexor lengths)   TREATMENT DATE: 11/17/23   Trigger Point Dry Needling Medicare ABN form signed and given to the front desk Initial Treatment: Pt instructed on Dry Needling rational, procedures, and possible side effects. Pt instructed to expect mild to moderate muscle soreness later in the day and/or into the next day.  Pt instructed in methods to reduce muscle soreness. Patient verbalized understanding of these instructions and education.  Patient Verbal Consent Given: Yes Education Handout Provided: Previously Provided pt has had DN in the past at this faciltiy Muscles Treated: bil gluteals and piriformis in sidelying  Electrical Stimulation Performed: yes 80 ma, intensity to 3 bil lumbar multifidi and bil gluteals 15 min Treatment Response/Outcome: improved soft tissue mobility; twitch responses on right which is a good prognostic indicator   TREATMENT DATE: 10/22/23 evaluation  Trigger Point Dry Needling Consent form signed and given to the front desk Initial Treatment: Pt instructed on Dry Needling rational, procedures, and possible side effects. Pt instructed to expect mild to moderate muscle soreness later in the day and/or into the next day.  Pt instructed in methods to reduce muscle soreness. Patient verbalized understanding of these instructions and education.  Patient Verbal Consent Given: Yes Education Handout Provided: Previously Provided pt has had DN in the past at this faciltiy Muscles Treated: bil gluteals and piriformis in sidelying  Electrical Stimulation Performed: No Treatment Response/Outcome: improved soft tissue mobility; twitch responses on right which is a good prognostic indicator     PATIENT EDUCATION:  Education details: Educated patient on anatomy and  physiology of current symptoms, prognosis, plan of care as well as initial self care strategies to promote recovery Person educated: Patient Education method: Explanation Education comprehension: verbalized understanding  HOME EXERCISE PROGRAM: To be started  ASSESSMENT:  CLINICAL IMPRESSION: The patient benefits significantly from dry needling and manual therapy to stimulate underlying myofascial trigger points and muscular tissue for management of neuromusculoskeletal pain and address movement impairments.    Electrical stimulation added to indwelling needles for further impacts on pain relief and neuromuscular changes.   Therapist continually monitoring response and frequently making adjustments to ES parameters. Much improved soft tissue mobility and decreased tender point size and number following treatment session.   OBJECTIVE IMPAIRMENTS: decreased activity tolerance, difficulty walking, decreased ROM, decreased strength, increased fascial restrictions, impaired perceived functional ability, and pain.   ACTIVITY LIMITATIONS: lifting, bending, standing, squatting, and caring for others  PARTICIPATION LIMITATIONS: meal prep, cleaning, laundry, and community activity  PERSONAL FACTORS: 1-2 comorbidities: prior spinal pain history, diabetes are also affecting patient's functional outcome.   REHAB POTENTIAL: Good  CLINICAL DECISION MAKING: Stable/uncomplicated  EVALUATION COMPLEXITY: Low   GOALS: Goals reviewed with patient? Yes  SHORT TERM GOALS: Target date: 11/19/2023   The patient will demonstrate knowledge of basic self care strategies and exercises to promote healing and recovery of function  Baseline: Goal status: INITIAL  2.  Pt will report at least 30% improvement in symptoms with light lifting, pushing and pulling tasks, sweeping and vacuuming  Baseline:  Goal status: INITIAL  3.  The patient will have improved hip strength to at least 4/5 needed for standing,  walking longer distances and descending stairs at home and in the community  Baseline:  Goal status: INITIAL  4.  The patient will have improved trunk flexor and extensor muscle strength to at least 4/5 needed for lifting light to medium weight objects such as grocery bags  Baseline:  Goal status: INITIAL   LONG TERM GOALS: Target date: 12/17/2023   The patient will be independent in a safe self progression of a home exercise program to promote further recovery of function  Baseline:  Goal status: INITIAL  2.  Pt will report at least 60% improvement in symptoms with light to medium lifting, changing the sheets, sweeping and vacumning  Baseline:  Goal status: INITIAL  3.  The patient will have improved hip strength to at least 4+/5 needed for standing, walking longer distances and descending stairs at home and in the community  Baseline:  Goal status: INITIAL  4.  The patient will have improved trunk flexor and extensor muscle strength to at least 4+/5 needed for lifting medium weight objects such as laundry and luggage  Baseline:  Goal status: INITIAL  5.  Modified Oswestry functional outcome measure score  improved to    16 % indicating improved function with ADLS with less pain.   Baseline:  Goal status: INITIAL    PLAN:  PT FREQUENCY: 1x/week  PT DURATION: 8 weeks  PLANNED INTERVENTIONS: 97164- PT Re-evaluation, 97110-Therapeutic exercises, 97530- Therapeutic activity, 97112- Neuromuscular re-education, 97535- Self Care, 02859- Manual therapy, [phone]- Aquatic Therapy, H9716- Electrical stimulation (unattended), [phone]- Electrical stimulation (manual), [id number]- Ultrasound, [id number]- Traction (mechanical), [id number]- Ionotophoresis 4mg /ml Dexamethasone , 79439 (1-2 muscles), 20561 (3+ muscles)- Dry Needling, Patient/Family education, Taping, Joint mobilization, Spinal mobilization, Cryotherapy, and Moist heat.  PLAN FOR NEXT SESSION: assess response to DN with ES; check STGs; pelvic  tilts; prone glute squeezes; Single leg modified RDL; my benefit from PSFS scale for functional activities   Glade Pesa, PT 11/17/23 6:08 PM Phone: [phone] Fax: [phone]

## 2023-11-25 ENCOUNTER — Encounter: Admitting: Physical Therapy

## 2023-12-02 ENCOUNTER — Ambulatory Visit: Admitting: Physical Therapy

## 2023-12-02 DIAGNOSIS — M5441 Lumbago with sciatica, right side: Secondary | ICD-10-CM

## 2023-12-02 DIAGNOSIS — M6281 Muscle weakness (generalized): Secondary | ICD-10-CM

## 2023-12-02 NOTE — Therapy (Signed)
 OUTPATIENT PHYSICAL THERAPY THORACOLUMBAR PROGRESS NOTE   Patient Name: Krista Austin MRN: [id number] DOB:1946-02-23, 78 y.o., female Today's Date: 12/02/2023  END OF SESSION:  PT End of Session - 12/02/23 1401     Visit Number 3    Date for PT Re-Evaluation 12/17/23    Authorization Type Medicare ABN signed for traditional Medicare 7/16    PT Start Time 1400    PT Stop Time 1440    PT Time Calculation (min) 40 min    Activity Tolerance Patient tolerated treatment well          Past Medical History:  Diagnosis Date   A-fib (HCC)    Adenomatous polyp    Arthritis    mostly fingers (10/07/2016)   At high risk for falls    Celiac disease    I do not have the disease; 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 (10/07/2016)   Chronic back pain    worse in my neck; lower back also affected (10/07/2016)   Chronic cholecystitis 10/16/2016   Complication of anesthesia    never fully regained my taste after my cervical fusion; I don't take much RX so I'm very sensitive to any sedation (10/07/2016)   Concussion    X2   DDD (degenerative disc disease), cervical    lumbar   Depression    hx; take Prozac  to keep me stable (10/07/2016)   Dizziness    caused by compression of cervical disc   Family history of adverse reaction to anesthesia    daughters get PONV; one daughter breaks out severely; grandson got suspended in a state of anxiousness after kidney surgery; had to be held for hours   GERD (gastroesophageal reflux disease)    Hyperlipidemia    OSA on CPAP    Pneumonia    once as an adult (10/07/2016)   Thyroid nodule    Type II diabetes mellitus (HCC)    controlled by diet and exercide   Past Surgical History:  Procedure Laterality Date   ANTERIOR CERVICAL DECOMP/DISCECTOMY FUSION  2015   BACK SURGERY     CARPAL TUNNEL RELEASE Bilateral    CATARACT EXTRACTION, BILATERAL Bilateral 09/10/2014 - 09/24/2014   by Evalene Raw   CHOLECYSTECTOMY N/A 10/16/2016   Procedure:  LAPAROSCOPIC CHOLECYSTECTOMY;  Surgeon: Belinda Cough, MD;  Location: [facility] OR;  Service: General;  Laterality: N/A;   LAPAROSCOPIC CHOLECYSTECTOMY  10/16/2016   LUMBAR DISC SURGERY  2007   TONSILLECTOMY  05/05/1983   TUBAL LIGATION  02/09/1979   Patient Active Problem List   Diagnosis Date Noted   Closed fracture of 9th & 10th ribs of right side 12/17/2016   Pneumothorax on right 12/17/2016   Lumbar degenerative disc disease 12/17/2016   Essential hypertension 12/17/2016   Dyslipidemia 10/07/2016   Pulmonary nodule 10/07/2016   Abnormal CXR    Chest pain 10/06/2016   S/P lumbar spinal fusion 08/05/2016   Obstructive sleep apnea syndrome, mild 10/27/2013   Controlled type 2 diabetes mellitus without complication, without long-term current use of insulin (HCC) 06/19/2011   GAD (generalized anxiety disorder) 11/01/2009    PCP: Nena Knee PA-C  REFERRING PROVIDER: Juliane Che PA  REFERRING DIAG: M54.41 lumbago with sciatica, right side  Rationale for Evaluation and Treatment: Rehabilitation  THERAPY DIAG:  Back pain; weakness ONSET DATE: June 2   SUBJECTIVE:  SUBJECTIVE STATEMENT: Right low back pain, right hip pain more than the left side.   PERTINENT HISTORY:  spinal fusion 2018; chronic neck pain; bil carpal tunnel surgery (wears night splints); DM; HTN; chronic kidney disease; chronic dizziness  PAIN:  Are you having pain? Yes NPRS scale: 4/10   Pain location: right and left low sacral pain,right buttock Pain orientation: Right and Left  PAIN TYPE: burning Pain description: constant  Aggravating factors: vacuuming, sweeping, bending over, lifting, changing the sheets Relieving factors: steroid dose pack, increased Gabapentin , wears back brace with activity, take breaks, lie in  recliner  PRECAUTIONS: None   WEIGHT BEARING RESTRICTIONS: No  FALLS:  Has patient fallen in last 6 months? No  LIVING ENVIRONMENT: Lives with: lives with their family Lives in: House/apartment  OCCUPATION: retired  PLOF: Independent  PATIENT GOALS: get a handle on this; don't want it to become a big problem again    OBJECTIVE:  Note: Objective measures were completed at Evaluation unless otherwise noted.  DIAGNOSTIC FINDINGS:  No new scans of lumbar  2020: IMPRESSION: Mild spinal stenosis L2-3 with moderate subarticular stenosis bilaterally, right greater than left   Left laminectomy L3-4. Moderate subarticular and foraminal stenosis bilaterally unchanged   Left foraminal disc protrusion L4-5. Severe subarticular and foraminal stenosis on the left has progressed. Mild spinal stenosis. Moderate subarticular foraminal stenosis on the right   Severe foraminal encroachment on the left at L5-S1 due to spurring. No change from the prior study.  PATIENT SURVEYS:  Modified Oswestry 20% (pt completed first page only)   COGNITION: Overall cognitive status: Within functional limits for tasks assessed      POSTURE: decreased lumbar lordosis  PALPATION: Tender points in bil gluteals and bil piriformis  LUMBAR ROM:   AROM eval  Flexion 50% limited  Extension 75% limited  Right lateral flexion 25% limited  Left lateral flexion 25% limited  Right rotation   Left rotation    (Blank rows = not tested)  TRUNK STRENGTH:  Decreased activation of transverse abdominus muscles; abdominals 4-/5; decreased activation of lumbar multifidi; trunk extensors 4-/5    LOWER EXTREMITY MMT:  bil hip abduction 4-/5 with pelvic drop noted with single leg standing 10 sec  FUNCTIONAL TESTS:  Able to rise from standard chair without UE assist Able to do partial squat to pick up small item from the floor  GAIT: decreased stride length (dec hip flexor lengths)   TREATMENT DATE:  12/02/23   Trigger Point Dry Needling Subsequent Treatment: Pt instructed on Dry Needling rational, procedures, and possible side effects. Pt instructed to expect mild to moderate muscle soreness later in the day and/or into the next day.  Pt instructed in methods to reduce muscle soreness. Patient verbalized understanding of these instructions and education.  Patient Verbal Consent Given: Yes Education Handout Provided: Previously Provided pt has had DN in the past at this faciltiy Muscles Treated: bil gluteals and piriformis in sidelying  Electrical Stimulation Performed: yes 80 ma, intensity to 3 bil lumbar multifidi and right gluteals 15 min Treatment Response/Outcome: improved soft tissue mobility; twitch responses on right which is a good prognostic indicator   TREATMENT DATE: 11/17/23   Trigger Point Dry Needling Medicare ABN form signed and given to the front desk Initial Treatment: Pt instructed on Dry Needling rational, procedures, and possible side effects. Pt instructed to expect mild to moderate muscle soreness later in the day and/or into the next day.  Pt instructed in methods to reduce muscle soreness.  Patient verbalized understanding of these instructions and education.  Patient Verbal Consent Given: Yes Education Handout Provided: Previously Provided pt has had DN in the past at this faciltiy Muscles Treated: bil gluteals and piriformis in sidelying  Electrical Stimulation Performed: yes 80 ma, intensity to 3 bil lumbar multifidi and bil gluteals 15 min Treatment Response/Outcome: improved soft tissue mobility; twitch responses on right which is a good prognostic indicator   TREATMENT DATE: 10/22/23 evaluation    Trigger Point Dry Needling Consent form signed and given to the front desk Initial Treatment: Pt instructed on Dry Needling rational, procedures, and possible side effects. Pt instructed to expect mild to moderate muscle soreness later in the day and/or into the  next day.  Pt instructed in methods to reduce muscle soreness. Patient verbalized understanding of these instructions and education.  Patient Verbal Consent Given: Yes Education Handout Provided: Previously Provided pt has had DN in the past at this faciltiy Muscles Treated: bil gluteals and piriformis in sidelying  Electrical Stimulation Performed: No Treatment Response/Outcome: improved soft tissue mobility; twitch responses on right which is a good prognostic indicator     PATIENT EDUCATION:  Education details: Educated patient on anatomy and physiology of current symptoms, prognosis, plan of care as well as initial self care strategies to promote recovery Person educated: Patient Education method: Explanation Education comprehension: verbalized understanding  HOME EXERCISE PROGRAM: To be started  ASSESSMENT:  CLINICAL IMPRESSION: The patient benefits significantly from dry needling combined with ES to stimulate underlying myofascial trigger points and muscular tissue for management of neuromusculoskeletal pain and address movement impairments.  Decreased tender point size and number in right gluteals today.  The patient had numerous muscle twitches produced during dry needling which is a good prognostic indicator for benefit as it is associated with positive neuromotor and nervous system changes.  Limited number of visits secondary to pt's son has been in the hospital due to a respiratory infection. Patient is his primary caregiver and is requiring more assistance from her at home now.  STGS not met but progressing towards them.   OBJECTIVE IMPAIRMENTS: decreased activity tolerance, difficulty walking, decreased ROM, decreased strength, increased fascial restrictions, impaired perceived functional ability, and pain.   ACTIVITY LIMITATIONS: lifting, bending, standing, squatting, and caring for others  PARTICIPATION LIMITATIONS: meal prep, cleaning, laundry, and community  activity  PERSONAL FACTORS: 1-2 comorbidities: prior spinal pain history, diabetes are also affecting patient's functional outcome.   REHAB POTENTIAL: Good  CLINICAL DECISION MAKING: Stable/uncomplicated  EVALUATION COMPLEXITY: Low   GOALS: Goals reviewed with patient? Yes  SHORT TERM GOALS: Target date: 11/19/2023   The patient will demonstrate knowledge of basic self care strategies and exercises to promote healing and recovery of function  Baseline: Goal status: met 7/31  2.  Pt will report at least 30% improvement in symptoms with light lifting, pushing and pulling tasks, sweeping and vacuuming  Baseline:  Goal status: INITIAL  3.  The patient will have improved hip strength to at least 4/5 needed for standing, walking longer distances and descending stairs at home and in the community  Baseline:  Goal status: INITIAL  4.  The patient will have improved trunk flexor and extensor muscle strength to at least 4/5 needed for lifting light to medium weight objects such as grocery bags  Baseline:  Goal status: INITIAL   LONG TERM GOALS: Target date: 12/17/2023   The patient will be independent in a safe self progression of a home exercise program to promote  further recovery of function  Baseline:  Goal status: INITIAL  2.  Pt will report at least 60% improvement in symptoms with light to medium lifting, changing the sheets, sweeping and vacumning  Baseline:  Goal status: INITIAL  3.  The patient will have improved hip strength to at least 4+/5 needed for standing, walking longer distances and descending stairs at home and in the community  Baseline:  Goal status: INITIAL  4.  The patient will have improved trunk flexor and extensor muscle strength to at least 4+/5 needed for lifting medium weight objects such as laundry and luggage  Baseline:  Goal status: INITIAL  5.  Modified Oswestry functional outcome measure score improved to    16 % indicating improved  function with ADLS with less pain.   Baseline:  Goal status: INITIAL    PLAN:  PT FREQUENCY: 1x/week  PT DURATION: 8 weeks  PLANNED INTERVENTIONS: 97164- PT Re-evaluation, 97110-Therapeutic exercises, 97530- Therapeutic activity, 97112- Neuromuscular re-education, 97535- Self Care, 02859- Manual therapy, [phone]- Aquatic Therapy, H9716- Electrical stimulation (unattended), [phone]- Electrical stimulation (manual), [id number]- Ultrasound, [id number]- Traction (mechanical), [id number]- Ionotophoresis 4mg /ml Dexamethasone , 79439 (1-2 muscles), 20561 (3+ muscles)- Dry Needling, Patient/Family education, Taping, Joint mobilization, Spinal mobilization, Cryotherapy, and Moist heat.  PLAN FOR NEXT SESSION: assess response to DN with ES; check remaining STGs; pelvic tilts; prone glute squeezes; Single leg modified RDL; my benefit from PSFS scale for functional activities   Glade Pesa, PT 12/02/23 5:37 PM Phone: [phone] Fax: [phone]

## 2023-12-09 ENCOUNTER — Ambulatory Visit: Attending: Adult Health Nurse Practitioner | Admitting: Physical Therapy

## 2023-12-09 DIAGNOSIS — M6281 Muscle weakness (generalized): Secondary | ICD-10-CM | POA: Insufficient documentation

## 2023-12-09 DIAGNOSIS — M5441 Lumbago with sciatica, right side: Secondary | ICD-10-CM | POA: Diagnosis present

## 2023-12-09 DIAGNOSIS — M542 Cervicalgia: Secondary | ICD-10-CM | POA: Insufficient documentation

## 2023-12-09 DIAGNOSIS — M5442 Lumbago with sciatica, left side: Secondary | ICD-10-CM | POA: Insufficient documentation

## 2023-12-09 NOTE — Therapy (Signed)
 OUTPATIENT PHYSICAL THERAPY THORACOLUMBAR PROGRESS NOTE/RECERTIFICATION   Patient Name: Krista Austin MRN: [id number] DOB:[birth year], 78 y.o., female Today's Date: 12/09/2023  END OF SESSION:  PT End of Session - 12/09/23 0935     Visit Number 4    Date for PT Re-Evaluation 02/03/24    Authorization Type Medicare ABN signed for traditional Medicare 7/16    PT Start Time 0931    PT Stop Time 1015    PT Time Calculation (min) 44 min    Activity Tolerance Patient tolerated treatment well          Past Medical History:  Diagnosis Date   A-fib (HCC)    Adenomatous polyp    Arthritis    mostly fingers (10/07/2016)   At high risk for falls    Celiac disease    I do not have the disease; 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 (10/07/2016)   Chronic back pain    worse in my neck; lower back also affected (10/07/2016)   Chronic cholecystitis 10/16/2016   Complication of anesthesia    never fully regained my taste after my cervical fusion; I don't take much RX so I'm very sensitive to any sedation (10/07/2016)   Concussion    X2   DDD (degenerative disc disease), cervical    lumbar   Depression    hx; take Prozac  to keep me stable (10/07/2016)   Dizziness    caused by compression of cervical disc   Family history of adverse reaction to anesthesia    daughters get PONV; one daughter breaks out severely; grandson got suspended in a state of anxiousness after kidney surgery; had to be held for hours   GERD (gastroesophageal reflux disease)    Hyperlipidemia    OSA on CPAP    Pneumonia    once as an adult (10/07/2016)   Thyroid nodule    Type II diabetes mellitus (HCC)    controlled by diet and exercide   Past Surgical History:  Procedure Laterality Date   ANTERIOR CERVICAL DECOMP/DISCECTOMY FUSION  2015   BACK SURGERY     CARPAL TUNNEL RELEASE Bilateral    CATARACT EXTRACTION, BILATERAL Bilateral 09/10/2014 - 09/24/2014   by Evalene Raw   CHOLECYSTECTOMY N/A 10/16/2016    Procedure: LAPAROSCOPIC CHOLECYSTECTOMY;  Surgeon: Belinda Cough, MD;  Location: [facility] OR;  Service: General;  Laterality: N/A;   LAPAROSCOPIC CHOLECYSTECTOMY  10/16/2016   LUMBAR DISC SURGERY  2007   TONSILLECTOMY  05/05/1983   TUBAL LIGATION  02/09/1979   Patient Active Problem List   Diagnosis Date Noted   Closed fracture of 9th & 10th ribs of right side 12/17/2016   Pneumothorax on right 12/17/2016   Lumbar degenerative disc disease 12/17/2016   Essential hypertension 12/17/2016   Dyslipidemia 10/07/2016   Pulmonary nodule 10/07/2016   Abnormal CXR    Chest pain 10/06/2016   S/P lumbar spinal fusion 08/05/2016   Obstructive sleep apnea syndrome, mild 10/27/2013   Controlled type 2 diabetes mellitus without complication, without long-term current use of insulin (HCC) 06/19/2011   GAD (generalized anxiety disorder) 11/01/2009    PCP: Nena Knee PA-C  REFERRING PROVIDER:  Baird Crank NP  REFERRING DIAG: M54.41 lumbago with sciatica, right side; DDD cervical   Rationale for Evaluation and Treatment: Rehabilitation  THERAPY DIAG:  Back pain; weakness ONSET DATE: June 2   SUBJECTIVE:  SUBJECTIVE STATEMENT: New referral received from a new provider with the addition of cervical DDD diagnosis along with diagnosis of lumbar discogenic back pain and LE pain.  Patient reports right > left neck pain and midback thoracic bil. Mid back pain hurts with walking and sleeping on left side.    PERTINENT HISTORY:  spinal fusion 2018; chronic neck pain; bil carpal tunnel surgery (wears night splints); DM; HTN; chronic kidney disease; chronic dizziness  PAIN:  Are you having pain? Yes NPRS scale: 4/10   Pain location: right > left neck and across bra line; back and buttock not as bad today Pain  orientation: Right and Left  PAIN TYPE: burning Pain description: constant  Aggravating factors: vacuuming, sweeping, bending over, lifting, changing the sheets; ldriving, sleeping, reading Relieving factors: steroid dose pack, increased Gabapentin , wears back brace with activity, take breaks, lie in recliner  PRECAUTIONS: None   WEIGHT BEARING RESTRICTIONS: No  FALLS:  Has patient fallen in last 6 months? No  LIVING ENVIRONMENT: Lives with: lives with their family Lives in: House/apartment  OCCUPATION: retired  PLOF: Independent  PATIENT GOALS: get a handle on this; don't want it to become a big problem again    OBJECTIVE:  Note: Objective measures were completed at Evaluation unless otherwise noted.  DIAGNOSTIC FINDINGS:  No new scans of lumbar  2020: IMPRESSION: Mild spinal stenosis L2-3 with moderate subarticular stenosis bilaterally, right greater than left   Left laminectomy L3-4. Moderate subarticular and foraminal stenosis bilaterally unchanged   Left foraminal disc protrusion L4-5. Severe subarticular and foraminal stenosis on the left has progressed. Mild spinal stenosis. Moderate subarticular foraminal stenosis on the right   Severe foraminal encroachment on the left at L5-S1 due to spurring. No change from the prior study.  PATIENT SURVEYS:  Modified Oswestry 20% (pt completed first page only)  NDI: 50%  COGNITION: Overall cognitive status: Within functional limits for tasks assessed      POSTURE: decreased lumbar lordosis  PALPATION: Tender points in bil gluteals and bil piriformis CERVICAL ROM:   Active ROM A/PROM (deg) eval  Flexion 25  Extension 42  Right lateral flexion 25 pain on right  Left lateral flexion 24 pain on right  Right rotation 37 pain on right  Left rotation 25 pain on right   (Blank rows = not tested)   THORACIC ROM: Decreased rotation and extension by 50%  STRENGTH:  deep cervical flexor and extensor strength  4-/5 LUMBAR ROM:   AROM eval  Flexion 50% limited  Extension 75% limited  Right lateral flexion 25% limited  Left lateral flexion 25% limited  Right rotation   Left rotation    (Blank rows = not tested)  TRUNK STRENGTH:  Decreased activation of transverse abdominus muscles; abdominals 4-/5; decreased activation of lumbar multifidi; trunk extensors 4-/5    LOWER EXTREMITY MMT:  bil hip abduction 4-/5 with pelvic drop noted with single leg standing 10 sec  FUNCTIONAL TESTS:  Able to rise from standard chair without UE assist Able to do partial squat to pick up small item from the floor  GAIT: decreased stride length (dec hip flexor lengths)   TREATMENT DATE: 12/09/23   Cervical ROM Thoracic ROM NDI Manual therapy: soft tissue mobilization to thoracic musculature; PA grade 1/2 oscillations lower t-spine 30 sec to each level Trigger Point Dry Needling Subsequent Treatment: Pt instructed on Dry Needling rational, procedures, and possible side effects. Pt instructed to expect mild to moderate muscle soreness later in the day and/or  into the next day.  Pt instructed in methods to reduce muscle soreness. Patient verbalized understanding of these instructions and education.  Patient Verbal Consent Given: Yes Education Handout Provided: Previously Provided pt has had DN in the past at this faciltiy Muscles Treated: bil cervical multifidi, bil upper traps, right levator scap, right rhomboids, right subscapularis, right infraspinatus (pt uses a cervical pillow for the face in prone for comfort) Electrical Stimulation Performed: no Treatment Response/Outcome: improved soft tissue mobility; twitch responses on right which is a good prognostic indicator   TREATMENT DATE: 12/02/23   Trigger Point Dry Needling Subsequent Treatment: Pt instructed on Dry Needling rational, procedures, and possible side effects. Pt instructed to expect mild to moderate muscle soreness later in the day and/or into  the next day.  Pt instructed in methods to reduce muscle soreness. Patient verbalized understanding of these instructions and education.  Patient Verbal Consent Given: Yes Education Handout Provided: Previously Provided pt has had DN in the past at this faciltiy Muscles Treated: bil gluteals and piriformis in sidelying  Electrical Stimulation Performed: yes 80 ma, intensity to 3 bil lumbar multifidi and right gluteals 15 min Treatment Response/Outcome: improved soft tissue mobility; twitch responses on right which is a good prognostic indicator   TREATMENT DATE: 11/17/23   Trigger Point Dry Needling Medicare ABN form signed and given to the front desk Initial Treatment: Pt instructed on Dry Needling rational, procedures, and possible side effects. Pt instructed to expect mild to moderate muscle soreness later in the day and/or into the next day.  Pt instructed in methods to reduce muscle soreness. Patient verbalized understanding of these instructions and education.  Patient Verbal Consent Given: Yes Education Handout Provided: Previously Provided pt has had DN in the past at this faciltiy Muscles Treated: bil gluteals and piriformis in sidelying  Electrical Stimulation Performed: yes 80 ma, intensity to 3 bil lumbar multifidi and bil gluteals 15 min Treatment Response/Outcome: improved soft tissue mobility; twitch responses on right which is a good prognostic indicator   TREATMENT DATE: 10/22/23 evaluation    Trigger Point Dry Needling Consent form signed and given to the front desk Initial Treatment: Pt instructed on Dry Needling rational, procedures, and possible side effects. Pt instructed to expect mild to moderate muscle soreness later in the day and/or into the next day.  Pt instructed in methods to reduce muscle soreness. Patient verbalized understanding of these instructions and education.  Patient Verbal Consent Given: Yes Education Handout Provided: Previously Provided pt has  had DN in the past at this faciltiy Muscles Treated: bil gluteals and piriformis in sidelying  Electrical Stimulation Performed: No Treatment Response/Outcome: improved soft tissue mobility; twitch responses on right which is a good prognostic indicator     PATIENT EDUCATION:  Education details: Educated patient on anatomy and physiology of current symptoms, prognosis, plan of care as well as initial self care strategies to promote recovery Person educated: Patient Education method: Explanation Education comprehension: verbalized understanding  HOME EXERCISE PROGRAM: To be started  ASSESSMENT:  CLINICAL IMPRESSION: The patient has a new referral received from a different provider with the addition of cervical DDD diagnosis along with diagnosis of lumbar discogenic back pain and LE pain.  Patient reports right > left neck pain and midback thoracic bil. Mid back pain hurts with walking and sleeping on left side.  Limited cervical ROM in all planes and painful on the right with sidebending and rotation.  Limited thoracic mobility as well.  She responds very well  to DN and manual therapy with a marked reduction in pain following.  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  She has recently had to physically assist him more with feeding, bathing and other ADLs with recent changes in his health which may be contributing to this exacerbation.    OBJECTIVE IMPAIRMENTS: decreased activity tolerance, difficulty walking, decreased ROM, decreased strength, increased fascial restrictions, impaired perceived functional ability, and pain.   ACTIVITY LIMITATIONS: lifting, bending, standing, squatting, and caring for others  PARTICIPATION LIMITATIONS: meal prep, cleaning, laundry, and community activity  PERSONAL FACTORS: 1-2 comorbidities: prior spinal pain history, diabetes are also affecting patient's functional  outcome.   REHAB POTENTIAL: Good  CLINICAL DECISION MAKING: Stable/uncomplicated  EVALUATION COMPLEXITY: Low   GOALS: Goals reviewed with patient? Yes  SHORT TERM GOALS: Target date: 11/19/2023   The patient will demonstrate knowledge of basic self care strategies and exercises to promote healing and recovery of function  Baseline: Goal status: met 7/31  2.  Pt will report at least 30% improvement in symptoms with light lifting, pushing and pulling tasks, sweeping and vacuuming  Baseline:  Goal status: INITIAL  3.  The patient will have improved hip strength to at least 4/5 needed for standing, walking longer distances and descending stairs at home and in the community  Baseline:  Goal status: INITIAL  4.  The patient will have improved trunk flexor and extensor muscle strength to at least 4/5 needed for lifting light to medium weight objects such as grocery bags  Baseline:  Goal status: INITIAL   LONG TERM GOALS: Target date:02/03/2024    The patient will be independent in a safe self progression of a home exercise program to promote further recovery of function  Baseline:  Goal status: ongoing  2.  Pt will report at least 60% improvement in symptoms with light to medium lifting, changing the sheets, sweeping and vacumning  Baseline:  Goal status: ongoing  3.  The patient will have improved hip strength to at least 4+/5 needed for standing, walking longer distances and descending stairs at home and in the community  Baseline:  Goal status: ongoing  4.  The patient will have improved trunk flexor and extensor muscle strength to at least 4+/5 needed for lifting medium weight objects such as laundry and luggage  Baseline:  Goal status: ongoing  5.  Modified Oswestry functional outcome measure score improved to    16 % indicating improved function with ADLS with less pain.   Baseline:  Goal status: ongoing  6. Neck Disability Index improved to 38% indicating  improved function with less pain NEW  7. Improved cervical rotation to 45 degrees needed for driving NEW    PLAN:  PT FREQUENCY: 1x/week  PT DURATION: 8 weeks  PLANNED INTERVENTIONS: 97164- PT Re-evaluation, 97110-Therapeutic exercises, 97530- Therapeutic activity, 97112- Neuromuscular re-education, 97535- Self Care, [id number]- Manual therapy, [phone]- Aquatic Therapy, [id number]- Electrical stimulation (unattended), [phone]- Electrical stimulation (manual), [id number]- Ultrasound, [id number]- Traction (mechanical), [id number]- Ionotophoresis 4mg /ml Dexamethasone , 79439 (1-2 muscles), [id number] (3+ muscles)- Dry Needling, Patient/Family education, Taping, Joint mobilization, Spinal mobilization, Cryotherapy, and Moist heat.  PLAN FOR NEXT SESSION: assess response to DN to cervical region and periscapular muscles; thoracic mobility; pelvic tilts; prone glute squeezes   Glade Pesa, PT 12/09/23 7:35 PM Phone: [phone] Fax: [phone]

## 2023-12-16 ENCOUNTER — Ambulatory Visit: Admitting: Physical Therapy

## 2023-12-16 DIAGNOSIS — M6281 Muscle weakness (generalized): Secondary | ICD-10-CM

## 2023-12-16 DIAGNOSIS — M5441 Lumbago with sciatica, right side: Secondary | ICD-10-CM | POA: Diagnosis not present

## 2023-12-16 DIAGNOSIS — M542 Cervicalgia: Secondary | ICD-10-CM

## 2023-12-16 NOTE — Therapy (Signed)
 OUTPATIENT PHYSICAL THERAPY THORACOLUMBAR PROGRESS NOTE   Patient Name: Krista Austin MRN: [id number] DOB:1945/10/22, 78 y.o., female Today's Date: 12/16/2023  END OF SESSION:  PT End of Session - 12/16/23 1014     Visit Number 5    Date for PT Re-Evaluation 02/03/24    Authorization Type Medicare ABN signed for traditional Medicare 7/16    PT Start Time 1015    PT Stop Time 1055    PT Time Calculation (min) 40 min    Activity Tolerance Patient tolerated treatment well          Past Medical History:  Diagnosis Date   A-fib (HCC)    Adenomatous polyp    Arthritis    mostly fingers (10/07/2016)   At high risk for falls    Celiac disease    I do not have the disease; 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 (10/07/2016)   Chronic back pain    worse in my neck; lower back also affected (10/07/2016)   Chronic cholecystitis 10/16/2016   Complication of anesthesia    never fully regained my taste after my cervical fusion; I don't take much RX so I'm very sensitive to any sedation (10/07/2016)   Concussion    X2   DDD (degenerative disc disease), cervical    lumbar   Depression    hx; take Prozac  to keep me stable (10/07/2016)   Dizziness    caused by compression of cervical disc   Family history of adverse reaction to anesthesia    daughters get PONV; one daughter breaks out severely; grandson got suspended in a state of anxiousness after kidney surgery; had to be held for hours   GERD (gastroesophageal reflux disease)    Hyperlipidemia    OSA on CPAP    Pneumonia    once as an adult (10/07/2016)   Thyroid nodule    Type II diabetes mellitus (HCC)    controlled by diet and exercide   Past Surgical History:  Procedure Laterality Date   ANTERIOR CERVICAL DECOMP/DISCECTOMY FUSION  2015   BACK SURGERY     CARPAL TUNNEL RELEASE Bilateral    CATARACT EXTRACTION, BILATERAL Bilateral 09/10/2014 - 09/24/2014   by Evalene Raw   CHOLECYSTECTOMY N/A 10/16/2016   Procedure:  LAPAROSCOPIC CHOLECYSTECTOMY;  Surgeon: Belinda Cough, MD;  Location: [facility] OR;  Service: General;  Laterality: N/A;   LAPAROSCOPIC CHOLECYSTECTOMY  10/16/2016   LUMBAR DISC SURGERY  2007   TONSILLECTOMY  05/05/1983   TUBAL LIGATION  02/09/1979   Patient Active Problem List   Diagnosis Date Noted   Closed fracture of 9th & 10th ribs of right side 12/17/2016   Pneumothorax on right 12/17/2016   Lumbar degenerative disc disease 12/17/2016   Essential hypertension 12/17/2016   Dyslipidemia 10/07/2016   Pulmonary nodule 10/07/2016   Abnormal CXR    Chest pain 10/06/2016   S/P lumbar spinal fusion 08/05/2016   Obstructive sleep apnea syndrome, mild 10/27/2013   Controlled type 2 diabetes mellitus without complication, without long-term current use of insulin (HCC) 06/19/2011   GAD (generalized anxiety disorder) 11/01/2009    PCP: Nena Knee PA-C  REFERRING PROVIDER:  Baird Crank NP  REFERRING DIAG: M54.41 lumbago with sciatica, right side; DDD cervical   Rationale for Evaluation and Treatment: Rehabilitation  THERAPY DIAG:  Back pain; weakness ONSET DATE: June 2   SUBJECTIVE:  SUBJECTIVE STATEMENT: My back is doing pretty good and the midback is getting better. I think I need the most help with my neck.  Pt is here with her son Garnette who has recently recovered from a respiratory illness.  He now able to help a little with some chores at home again.  They will go home after her treatment session.  PERTINENT HISTORY:  spinal fusion 2018; chronic neck pain; bil carpal tunnel surgery (wears night splints); DM; HTN; chronic kidney disease; chronic dizziness  PAIN:  Are you having pain? Yes NPRS scale: 4/10   Pain location: right > left neck and across bra line; back and buttock not as  bad today Pain orientation: Right and Left  PAIN TYPE: burning Pain description: constant  Aggravating factors: vacuuming, sweeping, bending over, lifting, changing the sheets; ldriving, sleeping, reading Relieving factors: steroid dose pack, increased Gabapentin , wears back brace with activity, take breaks, lie in recliner  PRECAUTIONS: None   WEIGHT BEARING RESTRICTIONS: No  FALLS:  Has patient fallen in last 6 months? No  LIVING ENVIRONMENT: Lives with: lives with their family Lives in: House/apartment  OCCUPATION: retired  PLOF: Independent  PATIENT GOALS: get a handle on this; don't want it to become a big problem again    OBJECTIVE:  Note: Objective measures were completed at Evaluation unless otherwise noted.  DIAGNOSTIC FINDINGS:  No new scans of lumbar  2020: IMPRESSION: Mild spinal stenosis L2-3 with moderate subarticular stenosis bilaterally, right greater than left   Left laminectomy L3-4. Moderate subarticular and foraminal stenosis bilaterally unchanged   Left foraminal disc protrusion L4-5. Severe subarticular and foraminal stenosis on the left has progressed. Mild spinal stenosis. Moderate subarticular foraminal stenosis on the right   Severe foraminal encroachment on the left at L5-S1 due to spurring. No change from the prior study.  PATIENT SURVEYS:  Modified Oswestry 20% (pt completed first page only)  NDI: 50%  COGNITION: Overall cognitive status: Within functional limits for tasks assessed      POSTURE: decreased lumbar lordosis  PALPATION: Tender points in bil gluteals and bil piriformis CERVICAL ROM:   Active ROM A/PROM (deg) eval  Flexion 25  Extension 42  Right lateral flexion 25 pain on right  Left lateral flexion 24 pain on right  Right rotation 37 pain on right  Left rotation 25 pain on right   (Blank rows = not tested)   THORACIC ROM: Decreased rotation and extension by 50%  STRENGTH:  deep cervical flexor and  extensor strength 4-/5 LUMBAR ROM:   AROM eval  Flexion 50% limited  Extension 75% limited  Right lateral flexion 25% limited  Left lateral flexion 25% limited  Right rotation   Left rotation    (Blank rows = not tested)  TRUNK STRENGTH:  Decreased activation of transverse abdominus muscles; abdominals 4-/5; decreased activation of lumbar multifidi; trunk extensors 4-/5    LOWER EXTREMITY MMT:  bil hip abduction 4-/5 with pelvic drop noted with single leg standing 10 sec  FUNCTIONAL TESTS:  Able to rise from standard chair without UE assist Able to do partial squat to pick up small item from the floor  GAIT: decreased stride length (dec hip flexor lengths)    TREATMENT DATE: 12/16/23   Discussion of current status and areas of focus Manual therapy: soft tissue mobilization to cervical, thoracic and upper lumbar musculature; PA grade 1/2 oscillations lower t-spine 30 sec to each level Trigger Point Dry Needling Subsequent Treatment: Pt instructed on Dry Needling rational,  procedures, and possible side effects. Pt instructed to expect mild to moderate muscle soreness later in the day and/or into the next day.  Pt instructed in methods to reduce muscle soreness. Patient verbalized understanding of these instructions and education.  Patient Verbal Consent Given: Yes Education Handout Provided: Previously Provided pt has had DN in the past at this faciltiy Muscles Treated: bil cervical multifidi, bil upper traps; bil levator scap, bil rhomboids,  Electrical Stimulation Performed: no Treatment Response/Outcome: improved soft tissue mobility; twitch responses on right which is a good prognostic indicator  TREATMENT DATE: 12/09/23   Cervical ROM Thoracic ROM NDI Manual therapy: soft tissue mobilization to thoracic musculature; PA grade 1/2 oscillations lower t-spine 30 sec to each level Trigger Point Dry Needling Subsequent Treatment: Pt instructed on Dry Needling rational, procedures,  and possible side effects. Pt instructed to expect mild to moderate muscle soreness later in the day and/or into the next day.  Pt instructed in methods to reduce muscle soreness. Patient verbalized understanding of these instructions and education.  Patient Verbal Consent Given: Yes Education Handout Provided: Previously Provided pt has had DN in the past at this faciltiy Muscles Treated: bil cervical multifidi, bil upper traps, right levator scap, right rhomboids, right subscapularis, right infraspinatus (pt uses a cervical pillow for the face in prone for comfort) Electrical Stimulation Performed: no Treatment Response/Outcome: improved soft tissue mobility; twitch responses on right which is a good prognostic indicator   TREATMENT DATE: 12/02/23   Trigger Point Dry Needling Subsequent Treatment: Pt instructed on Dry Needling rational, procedures, and possible side effects. Pt instructed to expect mild to moderate muscle soreness later in the day and/or into the next day.  Pt instructed in methods to reduce muscle soreness. Patient verbalized understanding of these instructions and education.  Patient Verbal Consent Given: Yes Education Handout Provided: Previously Provided pt has had DN in the past at this faciltiy Muscles Treated: bil gluteals and piriformis in sidelying  Electrical Stimulation Performed: yes 80 ma, intensity to 3 bil lumbar multifidi and right gluteals 15 min Treatment Response/Outcome: improved soft tissue mobility; twitch responses on right which is a good prognostic indicator  PATIENT EDUCATION:  Education details: Educated patient on anatomy and physiology of current symptoms, prognosis, plan of care as well as initial self care strategies to promote recovery Person educated: Patient Education method: Explanation Education comprehension: verbalized understanding  HOME EXERCISE PROGRAM: To be started  ASSESSMENT:  CLINICAL IMPRESSION: The patient reports  improvement in thoracic and lumbar region pain.  She reports neck and upper thoracic/periscapular pain is the primary issue today.  She responds well to DN as well as manual therapy with improved soft tissue and joint mobility noted following.  Her son's health is improving and he is able to do some light chores at home now which will reduce her load requirements. Therapist closely monitoring response throughout treatment session.  OBJECTIVE IMPAIRMENTS: decreased activity tolerance, difficulty walking, decreased ROM, decreased strength, increased fascial restrictions, impaired perceived functional ability, and pain.   ACTIVITY LIMITATIONS: lifting, bending, standing, squatting, and caring for others  PARTICIPATION LIMITATIONS: meal prep, cleaning, laundry, and community activity  PERSONAL FACTORS: 1-2 comorbidities: prior spinal pain history, diabetes are also affecting patient's functional outcome.   REHAB POTENTIAL: Good  CLINICAL DECISION MAKING: Stable/uncomplicated  EVALUATION COMPLEXITY: Low   GOALS: Goals reviewed with patient? Yes  SHORT TERM GOALS: Target date: 11/19/2023   The patient will demonstrate knowledge of basic self care strategies and exercises to promote  healing and recovery of function  Baseline: Goal status: met 7/31  2.  Pt will report at least 30% improvement in symptoms with light lifting, pushing and pulling tasks, sweeping and vacuuming  Baseline:  Goal status: ongoing  3.  The patient will have improved hip strength to at least 4/5 needed for standing, walking longer distances and descending stairs at home and in the community  Baseline:  Goal status: INITIAL  4.  The patient will have improved trunk flexor and extensor muscle strength to at least 4/5 needed for lifting light to medium weight objects such as grocery bags  Baseline:  Goal status: INITIAL   LONG TERM GOALS: Target date:02/03/2024    The patient will be independent in a safe  self progression of a home exercise program to promote further recovery of function  Baseline:  Goal status: ongoing  2.  Pt will report at least 60% improvement in symptoms with light to medium lifting, changing the sheets, sweeping and vacumning  Baseline:  Goal status: ongoing  3.  The patient will have improved hip strength to at least 4+/5 needed for standing, walking longer distances and descending stairs at home and in the community  Baseline:  Goal status: ongoing  4.  The patient will have improved trunk flexor and extensor muscle strength to at least 4+/5 needed for lifting medium weight objects such as laundry and luggage  Baseline:  Goal status: ongoing  5.  Modified Oswestry functional outcome measure score improved to    16 % indicating improved function with ADLS with less pain.   Baseline:  Goal status: ongoing  6. Neck Disability Index improved to 38% indicating improved function with less pain NEW  7. Improved cervical rotation to 45 degrees needed for driving NEW    PLAN:  PT FREQUENCY: 1x/week  PT DURATION: 8 weeks  PLANNED INTERVENTIONS: 97164- PT Re-evaluation, 97110-Therapeutic exercises, 97530- Therapeutic activity, 97112- Neuromuscular re-education, 97535- Self Care, 02859- Manual therapy, [phone]- Aquatic Therapy, [id number]- Electrical stimulation (unattended), [phone]- Electrical stimulation (manual), [id number]- Ultrasound, [id number]- Traction (mechanical), [id number]- Ionotophoresis 4mg /ml Dexamethasone , 79439 (1-2 muscles), 20561 (3+ muscles)- Dry Needling, Patient/Family education, Taping, Joint mobilization, Spinal mobilization, Cryotherapy, and Moist heat.  PLAN FOR NEXT SESSION: assess response to DN to cervical region and periscapular muscles; thoracic mobility; pelvic tilts; prone glute squeezes   Glade Pesa, PT 12/16/23 1:55 PM Phone: [phone] Fax: [phone]

## 2023-12-23 ENCOUNTER — Ambulatory Visit: Admitting: Physical Therapy

## 2023-12-27 NOTE — Progress Notes (Unsigned)
 Cardiology Office Note   Date:  12/28/2023  ID:  Krista Austin, DOB 01-Jun-1945, MRN [id number] PCP: Krista Austin  [facility] Providers Cardiologist:  Oneil Parchment, MD   History of Present Illness Krista Austin is a 78 y.o. female here for follow-up appointment.  Last seen by Dr. Parchment 09/21/2022 following a cardiac PET after abnormal EKG and significant coronary calcifications were noted.  Cardiac PET took place 05/12/2022 and showed fixed inferior perfusion defect with normal wall motion and normal RCA territory flow reserve, consistent with artifact.  Severe coronary calcifications but no 3 times daily or drop in EF with stress to suggest multivessel CAD.  In addition, flow reserves are normal globally and in each coronary distribution.  Low restudy.  Other than comorbidities include cervical disc degeneration, diabetes mellitus type 2, mixed hyperlipidemia, coronary artery calcification, and COPD.  She does have a significant history of family heart disease.  Prior to her quitting smoking in 2018, she has been smoking for about 50 years.  Her son Garnette has Down syndrome and has also seen Dr. Parchment in the past.  No fevers, chills, nausea, chest pain, or shortness of breath at that time.  Today, she presents for cardiovascular evaluation and medication management.  She has severe plaque buildup and is concerned about vascular dementia, given her mother's history with the condition. She is a former smoker with a fifty-year history. Her blood pressure typically ranges from the nineties to a hundred systolic. She is on 2.5 mg of lisinopril , reduced from 10 mg due to improved kidney function, but her kidney function has declined again. There is consideration of increasing lisinopril  to 5 mg, raising concerns about further lowering her blood pressure.  A recent EKG showed a small change compared to her previous one from 2023, but she is asymptomatic with no shortness of breath,  chest tightness, or decreased endurance. A PET/CT scan from January of the previous year showed stable results.  Her current medications include lisinopril  2.5 mg and Crestor .   Reports no shortness of breath nor dyspnea on exertion. Reports no chest pain, pressure, or tightness. No edema, orthopnea, PND. Reports no palpitations.   Discussed the use of AI scribe software for clinical note transcription with the patient, who gave verbal consent to proceed.   ROS: Pertinent ROS in HPI  Studies Reviewed      STRESS TESTS   NM PET CT CARDIAC PERFUSION MULTI W/ABSOLUTE BLOODFLOW 05/12/2022   Narrative   Fixed inferior perfusion defect with normal wall motion and normal RCA territory flow reserve, consistent with artifact.  Severe coronary calcifications, but no TID or drop in EF with stress to suggest multivessel CAD.  In addition, flow reserves are normal globally and in each coronary distribution.  Low risk study.   LV perfusion is abnormal. There is no evidence of ischemia. There is no evidence of infarction. Defect 1: There is a medium defect with mild reduction in uptake present in the apical to basal inferior location(s) that is fixed. There is normal wall motion in the defect area. Consistent with artifact caused by bowel tracer uptake and diaphragmatic attenuation.   Rest left ventricular function is normal. Stress left ventricular function is normal. End diastolic cavity size is normal. End systolic cavity size is normal.   Myocardial blood flow was computed to be 0.54ml/g/min at rest and 2.3ml/g/min at stress. Global myocardial blood flow reserve was 3.74 and was normal.   Coronary calcium  was present on  the attenuation correction CT images. Severe coronary calcifications were present. Coronary calcifications were present in the left anterior descending artery, left circumflex artery and right coronary artery distribution(s).   The study is normal. The study is low risk.   Electronically  signed by Lonni Nanas, MD   CLINICAL DATA:  This over-read does not include interpretation of cardiac or coronary anatomy or pathology. The Cardiac PET CT interpretation by the cardiologist is attached.   COMPARISON:  None available.   FINDINGS: No suspicious nodules, masses, or infiltrates are identified in the visualized portion of the lungs. No pleural fluid seen.   The visualized portions of the mediastinum and chest wall are unremarkable.   IMPRESSION: No significant non-cardiac abnormality identified.     Electronically Signed By: Norleen DELENA Kil M.D. On: 05/12/2022 11:16   ECHOCARDIOGRAM   ECHOCARDIOGRAM COMPLETE 04/17/2022   Narrative ECHOCARDIOGRAM REPORT       Patient Name:   Krista Austin Date of Exam: 04/16/2022 Medical Rec #:  [id number]      Height:       65.0 in Accession #:    [id number]     Weight:       151.0 lb Date of Birth:  06-09-1945       BSA:          1.755 m Patient Age:    76 years       BP:           100/60 mmHg Patient Gender: F              HR:           56 bpm. Exam Location:  [address]   Procedure: 3D Echo, 2D Echo, Cardiac Doppler, Color Doppler and Strain Analysis   Indications:    R06.09 Dyspnea   History:        Patient has prior history of Echocardiogram examinations, most recent 10/07/2016. CAD, Abnormal ECG, COPD, Arrythmias:Atrial Fibrillation, Signs/Symptoms:Dyspnea and Dizziness/Lightheadedness; Risk Factors:Family History of Coronary Artery Disease, Hypertension, Diabetes, Dyslipidemia, Former Smoker and Sleep Apnea.   Sonographer:    Heather Hawks RDCS Referring Phys: ONEIL JAYSON PARCHMENT   IMPRESSIONS     1. Left ventricular ejection fraction, by estimation, is 60 to 65%. Left ventricular ejection fraction by 3D volume is 68 %. The left ventricle has normal function. The left ventricle has no regional wall motion abnormalities. Left ventricular diastolic parameters are consistent with Grade I diastolic  dysfunction (impaired relaxation). The average left ventricular global longitudinal strain is -23.7 %. The global longitudinal strain is normal. 2. Right ventricular systolic function is normal. The right ventricular size is normal. Tricuspid regurgitation signal is inadequate for assessing PA pressure. 3. The mitral valve is normal in structure. No evidence of mitral valve regurgitation. No evidence of mitral stenosis. 4. The aortic valve is tricuspid. There is mild calcification of the aortic valve. Aortic valve regurgitation is not visualized. No aortic stenosis is present. 5. The inferior vena cava is normal in size with greater than 50% respiratory variability, suggesting right atrial pressure of 3 mmHg.   FINDINGS Left Ventricle: Left ventricular ejection fraction, by estimation, is 60 to 65%. Left ventricular ejection fraction by 3D volume is 68 %. The left ventricle has normal function. The left ventricle has no regional wall motion abnormalities. The average left ventricular global longitudinal strain is -23.7 %. The global longitudinal strain is normal. The left ventricular internal cavity size was normal in size. There is no  left ventricular hypertrophy. Left ventricular diastolic parameters are consistent with Grade I diastolic dysfunction (impaired relaxation).   Right Ventricle: The right ventricular size is normal. No increase in right ventricular wall thickness. Right ventricular systolic function is normal. Tricuspid regurgitation signal is inadequate for assessing PA pressure.   Left Atrium: Left atrial size was normal in size.   Right Atrium: Right atrial size was normal in size.   Pericardium: Trivial pericardial effusion is present.   Mitral Valve: The mitral valve is normal in structure. No evidence of mitral valve regurgitation. No evidence of mitral valve stenosis.   Tricuspid Valve: The tricuspid valve is normal in structure. Tricuspid valve regurgitation is trivial. No  evidence of tricuspid stenosis.   Aortic Valve: The aortic valve is tricuspid. There is mild calcification of the aortic valve. Aortic valve regurgitation is not visualized. No aortic stenosis is present.   Pulmonic Valve: The pulmonic valve was normal in structure. Pulmonic valve regurgitation is trivial. No evidence of pulmonic stenosis.   Aorta: The aortic root is normal in size and structure and the ascending aorta was not well visualized.   Venous: The inferior vena cava is normal in size with greater than 50% respiratory variability, suggesting right atrial pressure of 3 mmHg.   IAS/Shunts: No atrial level shunt detected by color flow Doppler.     LEFT VENTRICLE PLAX 2D LVIDd:         4.60 cm         Diastology LVIDs:         2.80 cm         LV e' medial:    8.24 cm/s LV PW:         0.60 cm         LV E/e' medial:  7.7 LV IVS:        0.60 cm         LV e' lateral:   10.40 cm/s LVOT diam:     1.90 cm         LV E/e' lateral: 6.1 LV SV:         66 LV SV Index:   37              2D LVOT Area:     2.84 cm        Longitudinal Strain 2D Strain GLS  -23.2 % (A2C): 2D Strain GLS  -25.8 % (A3C): 2D Strain GLS  -22.1 % (A4C): 2D Strain GLS  -23.7 % Avg:   3D Volume EF LV 3D EF:    Left ventricul ar ejection fraction by 3D volume is 68 %.   3D Volume EF: 3D EF:        68 % LV EDV:       102 ml LV ESV:       32 ml LV SV:        69 ml   RIGHT VENTRICLE RV Basal diam:  2.90 cm RV S prime:     12.40 cm/s TAPSE (M-mode): 2.3 cm   LEFT ATRIUM             Index        RIGHT ATRIUM           Index LA diam:        3.80 cm 2.16 cm/m   RA Area:     17.80 cm LA Vol (A2C):   50.7 ml 28.88 ml/m  RA Volume:   49.90 ml  28.43  ml/m LA Vol (A4C):   44.8 ml 25.52 ml/m LA Biplane Vol: 48.6 ml 27.69 ml/m AORTIC VALVE LVOT Vmax:   111.00 cm/s LVOT Vmean:  72.700 cm/s LVOT VTI:    0.231 m   AORTA Ao Root diam: 2.90 cm Ao Asc diam:  3.10 cm   MITRAL VALVE MV Area (PHT):  cm         SHUNTS MV Decel Time: 261 msec    Systemic VTI:  0.23 m MV E velocity: 63.40 cm/s  Systemic Diam: 1.90 cm MV A velocity: 83.10 cm/s MV E/A ratio:  0.76   Soyla Merck MD Electronically signed by Soyla Merck MD Signature Date/Time: 04/17/2022/7:41:09 AM       Final      Physical Exam VS:  BP (!) 98/48   Pulse 68   Ht 5' 4 (1.626 m)   Wt 150 lb 12.8 oz (68.4 kg)   BMI 25.88 kg/m        Wt Readings from Last 3 Encounters:  12/28/23 150 lb 12.8 oz (68.4 kg)  09/21/22 146 lb (66.2 kg)  03/17/22 151 lb (68.5 kg)    GEN: Well nourished, well developed in no acute distress NECK: No JVD; No carotid bruits CARDIAC: RRR, no murmurs, rubs, gallops RESPIRATORY:  Clear to auscultation without rales, wheezing or rhonchi  ABDOMEN: Soft, non-tender, non-distended EXTREMITIES:  No edema; No deformity   ASSESSMENT AND PLAN  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 - Schedule six-month follow-up with Dr. Jeffrie, including an EKG.  Chronic kidney disease Worsening kidney function raises the possibility of increasing lisinopril  to 5 mg, but there is concern about hypotension affecting renal perfusion. - Consult nephrologist regarding increasing lisinopril  to 5 mg, considering effects on blood pressure and renal perfusion. - Monitor blood pressure closely if lisinopril  dose is increased.  Type 2 diabetes mellitus A1c levels are rising after previous control with Ozempic. Currently not on diabetes medication. - Discuss potential re-initiation of Ozempic in three weeks with primary care provider.  Hyperlipidemia Concerns about cognitive effects of statins due to family history of vascular dementia. Considering switch from Crestor  to Praluent or Repatha,  which lack these cognitive side effects. - Initiate prior authorization for Praluent or Repatha. - Prescribe Praluent or Repatha upon insurance approval. - Recheck lipid panel 8-12 weeks after starting new medication.  Hypotension Blood pressure typically ranges from 90s to 100s, asymptomatic. Concern that increasing lisinopril  may exacerbate hypotension and affect renal perfusion. - Monitor blood pressure closely, especially if lisinopril  dose is increased.    Dispo: Follow-up in 6 months with Dr. Jeffrie with EKG  Signed, Orren LOISE Fabry, PA-C

## 2023-12-28 ENCOUNTER — Ambulatory Visit: Attending: Physician Assistant | Admitting: Physician Assistant

## 2023-12-28 VITALS — BP 98/48 | HR 68 | Ht 64.0 in | Wt 150.8 lb

## 2023-12-28 DIAGNOSIS — Z87891 Personal history of nicotine dependence: Secondary | ICD-10-CM | POA: Insufficient documentation

## 2023-12-28 DIAGNOSIS — I10 Essential (primary) hypertension: Secondary | ICD-10-CM | POA: Insufficient documentation

## 2023-12-28 DIAGNOSIS — R0609 Other forms of dyspnea: Secondary | ICD-10-CM | POA: Insufficient documentation

## 2023-12-28 DIAGNOSIS — I25118 Atherosclerotic heart disease of native coronary artery with other forms of angina pectoris: Secondary | ICD-10-CM | POA: Insufficient documentation

## 2023-12-28 NOTE — Patient Instructions (Signed)
 Medication Instructions:  No changes *If you need a refill on your cardiac medications before your next appointment, please call your pharmacy*  Lab Work: 8 weeks we will need a lipid panel   Testing/Procedures: No testing  Follow-Up: At [facility], you and your health needs are our priority.  As part of our continuing mission to provide you with exceptional heart care, our providers are all part of one team.  This team includes your primary Cardiologist (physician) and Advanced Practice Providers or APPs (Physician Assistants and Nurse Practitioners) who all work together to provide you with the care you need, when you need it.  Your next appointment:   6 month(s)  Provider:   Oneil Parchment, MD

## 2023-12-29 ENCOUNTER — Telehealth: Payer: Self-pay | Admitting: Pharmacy Technician

## 2023-12-29 ENCOUNTER — Other Ambulatory Visit (HOSPITAL_COMMUNITY): Payer: Self-pay

## 2023-12-29 NOTE — Telephone Encounter (Signed)
 Pharmacy Patient Advocate Encounter   Received notification from cc'd charts that prior authorization for repatha is required/requested.   Insurance verification completed.   The patient is insured through [employer] .   Per test claim: PA required; PA submitted to above mentioned insurance via Latent Key/confirmation #/[id number] Status is pending

## 2023-12-29 NOTE — Telephone Encounter (Signed)
 Pharmacy Patient Advocate Encounter  Received notification from [facility] that Prior Authorization for repatha has been APPROVED from 12/29/23 to 06/30/24. Ran test claim, Copay is $47.00 one month. This test claim was processed through [employer]- copay amounts may vary at other pharmacies due to pharmacy/plan contracts, or as the patient moves through the different stages of their insurance plan.   PA #/Case ID/Reference #: [id number]

## 2023-12-30 ENCOUNTER — Encounter: Payer: Self-pay | Admitting: Physician Assistant

## 2024-01-05 ENCOUNTER — Ambulatory Visit: Attending: Adult Health Nurse Practitioner | Admitting: Physical Therapy

## 2024-01-05 DIAGNOSIS — M542 Cervicalgia: Secondary | ICD-10-CM | POA: Insufficient documentation

## 2024-01-05 DIAGNOSIS — M5441 Lumbago with sciatica, right side: Secondary | ICD-10-CM | POA: Diagnosis present

## 2024-01-05 DIAGNOSIS — M6281 Muscle weakness (generalized): Secondary | ICD-10-CM | POA: Diagnosis present

## 2024-01-05 NOTE — Therapy (Signed)
 OUTPATIENT PHYSICAL THERAPY THORACOLUMBAR PROGRESS NOTE   Patient Name: Krista Austin MRN: [id number] DOB:1945-12-18, 78 y.o., female Today's Date: 01/05/2024  END OF SESSION:  PT End of Session - 01/05/24 1012     Visit Number 6    Date for PT Re-Evaluation 02/03/24    Authorization Type Medicare ABN signed for traditional Medicare 9/3    Progress Note Due on Visit 10    PT Start Time 1015    PT Stop Time 1056    PT Time Calculation (min) 41 min    Activity Tolerance Patient tolerated treatment well          Past Medical History:  Diagnosis Date   A-fib (HCC)    Adenomatous polyp    Arthritis    mostly fingers (10/07/2016)   At high risk for falls    Celiac disease    I do not have the disease; 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 (10/07/2016)   Chronic back pain    worse in my neck; lower back also affected (10/07/2016)   Chronic cholecystitis 10/16/2016   Complication of anesthesia    never fully regained my taste after my cervical fusion; I don't take much RX so I'm very sensitive to any sedation (10/07/2016)   Concussion    X2   DDD (degenerative disc disease), cervical    lumbar   Depression    hx; take Prozac  to keep me stable (10/07/2016)   Dizziness    caused by compression of cervical disc   Family history of adverse reaction to anesthesia    daughters get PONV; one daughter breaks out severely; grandson got suspended in a state of anxiousness after kidney surgery; had to be held for hours   GERD (gastroesophageal reflux disease)    Hyperlipidemia    OSA on CPAP    Pneumonia    once as an adult (10/07/2016)   Thyroid nodule    Type II diabetes mellitus (HCC)    controlled by diet and exercide   Past Surgical History:  Procedure Laterality Date   ANTERIOR CERVICAL DECOMP/DISCECTOMY FUSION  2015   BACK SURGERY     CARPAL TUNNEL RELEASE Bilateral    CATARACT EXTRACTION, BILATERAL Bilateral 09/10/2014 - 09/24/2014   by Evalene Raw   CHOLECYSTECTOMY N/A  10/16/2016   Procedure: LAPAROSCOPIC CHOLECYSTECTOMY;  Surgeon: Belinda Cough, MD;  Location: [facility] OR;  Service: General;  Laterality: N/A;   LAPAROSCOPIC CHOLECYSTECTOMY  10/16/2016   LUMBAR DISC SURGERY  2007   TONSILLECTOMY  05/05/1983   TUBAL LIGATION  02/09/1979   Patient Active Problem List   Diagnosis Date Noted   Closed fracture of 9th & 10th ribs of right side 12/17/2016   Pneumothorax on right 12/17/2016   Lumbar degenerative disc disease 12/17/2016   Essential hypertension 12/17/2016   Dyslipidemia 10/07/2016   Pulmonary nodule 10/07/2016   Abnormal CXR    Chest pain 10/06/2016   S/P lumbar spinal fusion 08/05/2016   Obstructive sleep apnea syndrome, mild 10/27/2013   Controlled type 2 diabetes mellitus without complication, without long-term current use of insulin (HCC) 06/19/2011   GAD (generalized anxiety disorder) 11/01/2009    PCP: Nena Knee PA-C  REFERRING PROVIDER:  Baird Crank NP  REFERRING DIAG: M54.41 lumbago with sciatica, right side; DDD cervical   Rationale for Evaluation and Treatment: Rehabilitation  THERAPY DIAG:  Back pain; weakness ONSET DATE: June 2   SUBJECTIVE:  SUBJECTIVE STATEMENT: It's the neck and midback.  The low back is doing OK.  Patient brought pillow for face and requested lowering the face piece as the treatment table was uncomfortable last time.     PERTINENT HISTORY:  spinal fusion 2018; chronic neck pain; bil carpal tunnel surgery (wears night splints); DM; HTN; chronic kidney disease; chronic dizziness  PAIN:  Are you having pain? Yes NPRS scale: 4/10   Pain location: right > left neck and thoracic region Pain orientation: Right and Left  PAIN TYPE: burning Pain description: constant  Aggravating factors: vacuuming, sweeping,  bending over, lifting, changing the sheets; ldriving, sleeping, reading Relieving factors: steroid dose pack, increased Gabapentin , wears back brace with activity, take breaks, lie in recliner  PRECAUTIONS: None   WEIGHT BEARING RESTRICTIONS: No  FALLS:  Has patient fallen in last 6 months? No  LIVING ENVIRONMENT: Lives with: lives with their family Lives in: House/apartment  OCCUPATION: retired  PLOF: Independent  PATIENT GOALS: get a handle on this; don't want it to become a big problem again    OBJECTIVE:  Note: Objective measures were completed at Evaluation unless otherwise noted.  DIAGNOSTIC FINDINGS:  No new scans of lumbar  2020: IMPRESSION: Mild spinal stenosis L2-3 with moderate subarticular stenosis bilaterally, right greater than left   Left laminectomy L3-4. Moderate subarticular and foraminal stenosis bilaterally unchanged   Left foraminal disc protrusion L4-5. Severe subarticular and foraminal stenosis on the left has progressed. Mild spinal stenosis. Moderate subarticular foraminal stenosis on the right   Severe foraminal encroachment on the left at L5-S1 due to spurring. No change from the prior study.  PATIENT SURVEYS:  Modified Oswestry 20% (pt completed first page only)  NDI: 50%  COGNITION: Overall cognitive status: Within functional limits for tasks assessed      POSTURE: decreased lumbar lordosis  PALPATION: Tender points in bil gluteals and bil piriformis CERVICAL ROM:   Active ROM A/PROM (deg) eval  Flexion 25  Extension 42  Right lateral flexion 25 pain on right  Left lateral flexion 24 pain on right  Right rotation 37 pain on right  Left rotation 25 pain on right   (Blank rows = not tested)   THORACIC ROM: Decreased rotation and extension by 50%  STRENGTH:  deep cervical flexor and extensor strength 4-/5 LUMBAR ROM:   AROM eval 9/3  Flexion 50% limited 25% limited  Extension 75% limited 50% limited  Right lateral  flexion 25% limited   Left lateral flexion 25% limited   Right rotation    Left rotation     (Blank rows = not tested)  TRUNK STRENGTH:  Decreased activation of transverse abdominus muscles; abdominals 4-/5; decreased activation of lumbar multifidi; trunk extensors 4-/5   9/3:  trunk musculature 4/5    LOWER EXTREMITY MMT:  bil hip abduction 4-/5 with pelvic drop noted with single leg standing 10 sec 9/3:  bil hip abduction and extension 4/5  FUNCTIONAL TESTS:  Able to rise from standard chair without UE assist Able to do partial squat to pick up small item from the floor  GAIT: decreased stride length (dec hip flexor lengths)   TREATMENT DATE: 01/05/24   Discussion of current status and areas of focus Seated thoracic extension with towel roll behind back and also with ball at mid back as fulcrum; hands behind neck 2x8 Seated ball at mid tspine with lateral shifting 10x Manual therapy: soft tissue mobilization to cervical, thoracic and upper lumbar musculature; PA grade 1/2 oscillations  lower t-spine 30 sec to each level Trigger Point Dry Needling Subsequent Treatment: Pt instructed on Dry Needling rational, procedures, and possible side effects. Pt instructed to expect mild to moderate muscle soreness later in the day and/or into the next day.  Pt instructed in methods to reduce muscle soreness. Patient verbalized understanding of these instructions and education.  Patient Verbal Consent Given: Yes Education Handout Provided: Previously Provided pt has had DN in the past at this faciltiy Muscles Treated:  bil cervical multifidi; bil upper traps; bil levator scap, bil rhomboids, thoracic multifidi with extra marination time Electrical Stimulation Performed: no Treatment Response/Outcome: improved soft tissue mobility; twitch responses on right which is a good prognostic indicator   TREATMENT DATE: 12/16/23   Discussion of current status and areas of focus Manual therapy: soft  tissue mobilization to cervical, thoracic and upper lumbar musculature; PA grade 1/2 oscillations lower t-spine 30 sec to each level Trigger Point Dry Needling Subsequent Treatment: Pt instructed on Dry Needling rational, procedures, and possible side effects. Pt instructed to expect mild to moderate muscle soreness later in the day and/or into the next day.  Pt instructed in methods to reduce muscle soreness. Patient verbalized understanding of these instructions and education.  Patient Verbal Consent Given: Yes Education Handout Provided: Previously Provided pt has had DN in the past at this faciltiy Muscles Treated: bil cervical multifidi, bil upper traps; bil levator scap, bil rhomboids,  Electrical Stimulation Performed: no Treatment Response/Outcome: improved soft tissue mobility; twitch responses on right which is a good prognostic indicator  TREATMENT DATE: 12/09/23   Cervical ROM Thoracic ROM NDI Manual therapy: soft tissue mobilization to thoracic musculature; PA grade 1/2 oscillations lower t-spine 30 sec to each level Trigger Point Dry Needling Subsequent Treatment: Pt instructed on Dry Needling rational, procedures, and possible side effects. Pt instructed to expect mild to moderate muscle soreness later in the day and/or into the next day.  Pt instructed in methods to reduce muscle soreness. Patient verbalized understanding of these instructions and education.  Patient Verbal Consent Given: Yes Education Handout Provided: Previously Provided pt has had DN in the past at this faciltiy Muscles Treated: bil cervical multifidi, bil upper traps, right levator scap, right rhomboids, right subscapularis, right infraspinatus (pt uses a cervical pillow for the face in prone for comfort) Electrical Stimulation Performed: no Treatment Response/Outcome: improved soft tissue mobility; twitch responses on right which is a good prognostic indicator  PATIENT EDUCATION:  Education details:  Educated patient on anatomy and physiology of current symptoms, prognosis, plan of care as well as initial self care strategies to promote recovery Person educated: Patient Education method: Explanation Education comprehension: verbalized understanding  HOME EXERCISE PROGRAM: Thoracic extension seated with towel or ball ASSESSMENT:  CLINICAL IMPRESSION: Low back symptoms much improved over the last 2 weeks.  Her primary complaint is cervical and thoracic pain and she responds well to manual therapy as well as DN to address taut bands and myofascial tender points in these areas.  Good response to seated thoracic mobility ex's especially with the ball.  Verbal cues to fixate neck with thoracic extension for comfort. Therapist monitoring response to all interventions and modifying treatment accordingly.     OBJECTIVE IMPAIRMENTS: decreased activity tolerance, difficulty walking, decreased ROM, decreased strength, increased fascial restrictions, impaired perceived functional ability, and pain.   ACTIVITY LIMITATIONS: lifting, bending, standing, squatting, and caring for others  PARTICIPATION LIMITATIONS: meal prep, cleaning, laundry, and community activity  PERSONAL FACTORS: 1-2 comorbidities: prior spinal  pain history, diabetes are also affecting patient's functional outcome.   REHAB POTENTIAL: Good  CLINICAL DECISION MAKING: Stable/uncomplicated  EVALUATION COMPLEXITY: Low   GOALS: Goals reviewed with patient? Yes  SHORT TERM GOALS: Target date: 11/19/2023   The patient will demonstrate knowledge of basic self care strategies and exercises to promote healing and recovery of function  Baseline: Goal status: met 7/31  2.  Pt will report at least 30% improvement in symptoms with light lifting, pushing and pulling tasks, sweeping and vacuuming  Baseline:  Goal status: met 9/3  3.  The patient will have improved hip strength to at least 4/5 needed for standing, walking longer  distances and descending stairs at home and in the community  Baseline:  Goal status: met 9/3  4.  The patient will have improved trunk flexor and extensor muscle strength to at least 4/5 needed for lifting light to medium weight objects such as grocery bags  Baseline:  Goal status:met 9/3   LONG TERM GOALS: Target date:02/03/2024    The patient will be independent in a safe self progression of a home exercise program to promote further recovery of function  Baseline:  Goal status: ongoing  2.  Pt will report at least 60% improvement in symptoms with light to medium lifting, changing the sheets, sweeping and vacumning  Baseline:  Goal status: ongoing  3.  The patient will have improved hip strength to at least 4+/5 needed for standing, walking longer distances and descending stairs at home and in the community  Baseline:  Goal status: ongoing  4.  The patient will have improved trunk flexor and extensor muscle strength to at least 4+/5 needed for lifting medium weight objects such as laundry and luggage  Baseline:  Goal status: ongoing  5.  Modified Oswestry functional outcome measure score improved to    16 % indicating improved function with ADLS with less pain.   Baseline:  Goal status: ongoing  6. Neck Disability Index improved to 38% indicating improved function with less pain NEW  7. Improved cervical rotation to 45 degrees needed for driving NEW    PLAN:  PT FREQUENCY: 1x/week  PT DURATION: 8 weeks  PLANNED INTERVENTIONS: 97164- PT Re-evaluation, 97110-Therapeutic exercises, 97530- Therapeutic activity, 97112- Neuromuscular re-education, 97535- Self Care, [id number]- Manual therapy, [phone]- Aquatic Therapy, [id number]- Electrical stimulation (unattended), [id number]- Electrical stimulation (manual), [id number]- Ultrasound, [id number]- Traction (mechanical), [id number]- Ionotophoresis 4mg /ml Dexamethasone , 79439 (1-2 muscles), 20561 (3+ muscles)- Dry Needling, Patient/Family education,  Taping, Joint mobilization, Spinal mobilization, Cryotherapy, and Moist heat.  PLAN FOR NEXT SESSION: check cervical ROM; DN to cervical region and periscapular muscles; thoracic mobility seated; try UE band ex's in supine  Glade Pesa, PT 01/05/24 2:12 PM Phone: [phone] Fax: [phone]

## 2024-01-13 ENCOUNTER — Ambulatory Visit: Admitting: Physical Therapy

## 2024-01-20 ENCOUNTER — Ambulatory Visit: Admitting: Physical Therapy

## 2024-01-20 DIAGNOSIS — M542 Cervicalgia: Secondary | ICD-10-CM

## 2024-01-20 DIAGNOSIS — M5441 Lumbago with sciatica, right side: Secondary | ICD-10-CM | POA: Diagnosis not present

## 2024-01-20 DIAGNOSIS — M6281 Muscle weakness (generalized): Secondary | ICD-10-CM

## 2024-01-20 NOTE — Therapy (Signed)
 OUTPATIENT PHYSICAL THERAPY THORACOLUMBAR PROGRESS NOTE   Patient Name: Krista Austin MRN: [id number] DOB:01-17-1946, 78 y.o., female Today's Date: 01/20/2024  END OF SESSION:  PT End of Session - 01/20/24 1056     Visit Number 7    Date for Recertification  02/03/24    Authorization Type Medicare ABN signed for traditional Medicare 9/3    Progress Note Due on Visit 10    PT Start Time 1100    PT Stop Time 1143    PT Time Calculation (min) 43 min    Activity Tolerance Patient tolerated treatment well          Past Medical History:  Diagnosis Date   A-fib (HCC)    Adenomatous polyp    Arthritis    mostly fingers (10/07/2016)   At high risk for falls    Celiac disease    I do not have the disease; 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 (10/07/2016)   Chronic back pain    worse in my neck; lower back also affected (10/07/2016)   Chronic cholecystitis 10/16/2016   Complication of anesthesia    never fully regained my taste after my cervical fusion; I don't take much RX so I'm very sensitive to any sedation (10/07/2016)   Concussion    X2   DDD (degenerative disc disease), cervical    lumbar   Depression    hx; take Prozac  to keep me stable (10/07/2016)   Dizziness    caused by compression of cervical disc   Family history of adverse reaction to anesthesia    daughters get PONV; one daughter breaks out severely; grandson got suspended in a state of anxiousness after kidney surgery; had to be held for hours   GERD (gastroesophageal reflux disease)    Hyperlipidemia    OSA on CPAP    Pneumonia    once as an adult (10/07/2016)   Thyroid nodule    Type II diabetes mellitus (HCC)    controlled by diet and exercide   Past Surgical History:  Procedure Laterality Date   ANTERIOR CERVICAL DECOMP/DISCECTOMY FUSION  2015   BACK SURGERY     CARPAL TUNNEL RELEASE Bilateral    CATARACT EXTRACTION, BILATERAL Bilateral 09/10/2014 - 09/24/2014   by Evalene Raw   CHOLECYSTECTOMY N/A  10/16/2016   Procedure: LAPAROSCOPIC CHOLECYSTECTOMY;  Surgeon: Belinda Cough, MD;  Location: [facility] OR;  Service: General;  Laterality: N/A;   LAPAROSCOPIC CHOLECYSTECTOMY  10/16/2016   LUMBAR DISC SURGERY  2007   TONSILLECTOMY  05/05/1983   TUBAL LIGATION  02/09/1979   Patient Active Problem List   Diagnosis Date Noted   Closed fracture of 9th & 10th ribs of right side 12/17/2016   Pneumothorax on right 12/17/2016   Lumbar degenerative disc disease 12/17/2016   Essential hypertension 12/17/2016   Dyslipidemia 10/07/2016   Pulmonary nodule 10/07/2016   Abnormal CXR    Chest pain 10/06/2016   S/P lumbar spinal fusion 08/05/2016   Obstructive sleep apnea syndrome, mild 10/27/2013   Controlled type 2 diabetes mellitus without complication, without long-term current use of insulin (HCC) 06/19/2011   GAD (generalized anxiety disorder) 11/01/2009    PCP: Nena Knee PA-C  REFERRING PROVIDER:  Baird Crank NP  REFERRING DIAG: M54.41 lumbago with sciatica, right side; DDD cervical   Rationale for Evaluation and Treatment: Rehabilitation  THERAPY DIAG:  Back pain; weakness ONSET DATE: June 2   SUBJECTIVE:  SUBJECTIVE STATEMENT: The DN didn't help last time.  I think the hands on work would be best.   The low back is doing OK.    PERTINENT HISTORY:  spinal fusion 2018; chronic neck pain; bil carpal tunnel surgery (wears night splints); DM; HTN; chronic kidney disease; chronic dizziness  PAIN:  Are you having pain? Yes NPRS scale: 4/10   Pain location: right > left neck and thoracic region Pain orientation: Right and Left  PAIN TYPE: burning Pain description: constant  Aggravating factors: vacuuming, sweeping, bending over, lifting, changing the sheets; ldriving, sleeping,  reading Relieving factors: steroid dose pack, increased Gabapentin , wears back brace with activity, take breaks, lie in recliner  PRECAUTIONS: None   WEIGHT BEARING RESTRICTIONS: No  FALLS:  Has patient fallen in last 6 months? No  LIVING ENVIRONMENT: Lives with: lives with their family Lives in: House/apartment  OCCUPATION: retired  PLOF: Independent  PATIENT GOALS: get a handle on this; don't want it to become a big problem again    OBJECTIVE:  Note: Objective measures were completed at Evaluation unless otherwise noted.  DIAGNOSTIC FINDINGS:  No new scans of lumbar  2020: IMPRESSION: Mild spinal stenosis L2-3 with moderate subarticular stenosis bilaterally, right greater than left   Left laminectomy L3-4. Moderate subarticular and foraminal stenosis bilaterally unchanged   Left foraminal disc protrusion L4-5. Severe subarticular and foraminal stenosis on the left has progressed. Mild spinal stenosis. Moderate subarticular foraminal stenosis on the right   Severe foraminal encroachment on the left at L5-S1 due to spurring. No change from the prior study.  PATIENT SURVEYS:  Modified Oswestry 20% (pt completed first page only)  NDI: 50%  COGNITION: Overall cognitive status: Within functional limits for tasks assessed      POSTURE: decreased lumbar lordosis  PALPATION: Tender points in bil gluteals and bil piriformis CERVICAL ROM:   Active ROM A/PROM (deg) eval  Flexion 25  Extension 42  Right lateral flexion 25 pain on right  Left lateral flexion 24 pain on right  Right rotation 37 pain on right  Left rotation 25 pain on right   (Blank rows = not tested)   THORACIC ROM: Decreased rotation and extension by 50%  STRENGTH:  deep cervical flexor and extensor strength 4-/5 LUMBAR ROM:   AROM eval 9/3  Flexion 50% limited 25% limited  Extension 75% limited 50% limited  Right lateral flexion 25% limited   Left lateral flexion 25% limited   Right  rotation    Left rotation     (Blank rows = not tested)  TRUNK STRENGTH:  Decreased activation of transverse abdominus muscles; abdominals 4-/5; decreased activation of lumbar multifidi; trunk extensors 4-/5   9/3:  trunk musculature 4/5    LOWER EXTREMITY MMT:  bil hip abduction 4-/5 with pelvic drop noted with single leg standing 10 sec 9/3:  bil hip abduction and extension 4/5  FUNCTIONAL TESTS:  Able to rise from standard chair without UE assist Able to do partial squat to pick up small item from the floor  GAIT: decreased stride length (dec hip flexor lengths)   TREATMENT DATE: 01/20/24   Discussion of current status and areas of focus Home traction use: daily or even 2x/day ok; since it's static start with 5 minutes 5-8 pounds to test tolerance Home use of heat/massager chair regularly Supine cervical manual distraction 10x 10 sec holds Supine suboccipital distraction 5 min Supine upper trap stretches passively 3x right/left Supine glenohumeral distraction and Grade 1/2 oscillations inferior and  posterior Prone thoracic PA mobs grade 2 Periscapular soft tissue mobilization bil  TREATMENT DATE: 01/05/24   Discussion of current status and areas of focus Seated thoracic extension with towel roll behind back and also with ball at mid back as fulcrum; hands behind neck 2x8 Seated ball at mid tspine with lateral shifting 10x Manual therapy: soft tissue mobilization to cervical, thoracic and upper lumbar musculature; PA grade 1/2 oscillations lower t-spine 30 sec to each level Trigger Point Dry Needling Subsequent Treatment: Pt instructed on Dry Needling rational, procedures, and possible side effects. Pt instructed to expect mild to moderate muscle soreness later in the day and/or into the next day.  Pt instructed in methods to reduce muscle soreness. Patient verbalized understanding of these instructions and education.  Patient Verbal Consent Given: Yes Education Handout  Provided: Previously Provided pt has had DN in the past at this faciltiy Muscles Treated:  bil cervical multifidi; bil upper traps; bil levator scap, bil rhomboids, thoracic multifidi with extra marination time Electrical Stimulation Performed: no Treatment Response/Outcome: improved soft tissue mobility; twitch responses on right which is a good prognostic indicator   TREATMENT DATE: 12/16/23   Discussion of current status and areas of focus Manual therapy: soft tissue mobilization to cervical, thoracic and upper lumbar musculature; PA grade 1/2 oscillations lower t-spine 30 sec to each level Trigger Point Dry Needling Subsequent Treatment: Pt instructed on Dry Needling rational, procedures, and possible side effects. Pt instructed to expect mild to moderate muscle soreness later in the day and/or into the next day.  Pt instructed in methods to reduce muscle soreness. Patient verbalized understanding of these instructions and education.  Patient Verbal Consent Given: Yes Education Handout Provided: Previously Provided pt has had DN in the past at this faciltiy Muscles Treated: bil cervical multifidi, bil upper traps; bil levator scap, bil rhomboids,  Electrical Stimulation Performed: no Treatment Response/Outcome: improved soft tissue mobility; twitch responses on right which is a good prognostic indicator  TREATMENT DATE: 12/09/23   Cervical ROM Thoracic ROM NDI Manual therapy: soft tissue mobilization to thoracic musculature; PA grade 1/2 oscillations lower t-spine 30 sec to each level Trigger Point Dry Needling Subsequent Treatment: Pt instructed on Dry Needling rational, procedures, and possible side effects. Pt instructed to expect mild to moderate muscle soreness later in the day and/or into the next day.  Pt instructed in methods to reduce muscle soreness. Patient verbalized understanding of these instructions and education.  Patient Verbal Consent Given: Yes Education Handout  Provided: Previously Provided pt has had DN in the past at this faciltiy Muscles Treated: bil cervical multifidi, bil upper traps, right levator scap, right rhomboids, right subscapularis, right infraspinatus (pt uses a cervical pillow for the face in prone for comfort) Electrical Stimulation Performed: no Treatment Response/Outcome: improved soft tissue mobility; twitch responses on right which is a good prognostic indicator  PATIENT EDUCATION:  Education details: Educated patient on anatomy and physiology of current symptoms, prognosis, plan of care as well as initial self care strategies to promote recovery Person educated: Patient Education method: Explanation Education comprehension: verbalized understanding  HOME EXERCISE PROGRAM: Thoracic extension seated with towel or ball ASSESSMENT:  CLINICAL IMPRESSION: Good initial response to manual therapy to address cervical, thoracic and glenohumeral hypomobility.  Much improved joint and soft tissue mobility following interventions.  Discussed home/self care management including home traction and heat/massager modalities to help with this cervical flare.  Therapist monitoring response to all interventions and modifying treatment accordingly.    OBJECTIVE  IMPAIRMENTS: decreased activity tolerance, difficulty walking, decreased ROM, decreased strength, increased fascial restrictions, impaired perceived functional ability, and pain.   ACTIVITY LIMITATIONS: lifting, bending, standing, squatting, and caring for others  PARTICIPATION LIMITATIONS: meal prep, cleaning, laundry, and community activity  PERSONAL FACTORS: 1-2 comorbidities: prior spinal pain history, diabetes are also affecting patient's functional outcome.   REHAB POTENTIAL: Good  CLINICAL DECISION MAKING: Stable/uncomplicated  EVALUATION COMPLEXITY: Low   GOALS: Goals reviewed with patient? Yes  SHORT TERM GOALS: Target date: 11/19/2023   The patient will demonstrate  knowledge of basic self care strategies and exercises to promote healing and recovery of function  Baseline: Goal status: met 7/31  2.  Pt will report at least 30% improvement in symptoms with light lifting, pushing and pulling tasks, sweeping and vacuuming  Baseline:  Goal status: met 9/3  3.  The patient will have improved hip strength to at least 4/5 needed for standing, walking longer distances and descending stairs at home and in the community  Baseline:  Goal status: met 9/3  4.  The patient will have improved trunk flexor and extensor muscle strength to at least 4/5 needed for lifting light to medium weight objects such as grocery bags  Baseline:  Goal status:met 9/3   LONG TERM GOALS: Target date:02/03/2024    The patient will be independent in a safe self progression of a home exercise program to promote further recovery of function  Baseline:  Goal status: ongoing  2.  Pt will report at least 60% improvement in symptoms with light to medium lifting, changing the sheets, sweeping and vacumning  Baseline:  Goal status: ongoing  3.  The patient will have improved hip strength to at least 4+/5 needed for standing, walking longer distances and descending stairs at home and in the community  Baseline:  Goal status: ongoing  4.  The patient will have improved trunk flexor and extensor muscle strength to at least 4+/5 needed for lifting medium weight objects such as laundry and luggage  Baseline:  Goal status: ongoing  5.  Modified Oswestry functional outcome measure score improved to    16 % indicating improved function with ADLS with less pain.   Baseline:  Goal status: ongoing  6. Neck Disability Index improved to 38% indicating improved function with less pain NEW  7. Improved cervical rotation to 45 degrees needed for driving NEW    PLAN:  PT FREQUENCY: 1x/week  PT DURATION: 8 weeks  PLANNED INTERVENTIONS: 97164- PT Re-evaluation, 97110-Therapeutic  exercises, 97530- Therapeutic activity, 97112- Neuromuscular re-education, 97535- Self Care, [id number]- Manual therapy, [phone]- Aquatic Therapy, [id number]- Electrical stimulation (unattended), [phone]- Electrical stimulation (manual), [id number]- Ultrasound, [id number]- Traction (mechanical), [id number]- Ionotophoresis 4mg /ml Dexamethasone , 79439 (1-2 muscles), 20561 (3+ muscles)- Dry Needling, Patient/Family education, Taping, Joint mobilization, Spinal mobilization, Cryotherapy, and Moist heat.  PLAN FOR NEXT SESSION: check cervical ROM; see how home traction is going; manual techniques; DN to cervical region and periscapular muscles; thoracic mobility seated; try UE band ex's in supine  Glade Pesa, PT 01/20/24 10:06 PM Phone: [phone] Fax: [phone]

## 2024-01-27 ENCOUNTER — Ambulatory Visit: Admitting: Physical Therapy

## 2024-01-27 DIAGNOSIS — M542 Cervicalgia: Secondary | ICD-10-CM

## 2024-01-27 DIAGNOSIS — M6281 Muscle weakness (generalized): Secondary | ICD-10-CM

## 2024-01-27 DIAGNOSIS — M5441 Lumbago with sciatica, right side: Secondary | ICD-10-CM | POA: Diagnosis not present

## 2024-01-27 NOTE — Therapy (Signed)
 OUTPATIENT PHYSICAL THERAPY THORACOLUMBAR PROGRESS NOTE   Patient Name: Krista Austin MRN: [id number] DOB:[birth year], 78 y.o., female Today's Date: 01/27/2024  END OF SESSION:  PT End of Session - 01/27/24 1105     Visit Number 8    Date for Recertification  02/03/24    Authorization Type Medicare ABN signed for traditional Medicare 9/3    Progress Note Due on Visit 10    PT Start Time 1100    PT Stop Time 1140    PT Time Calculation (min) 40 min    Activity Tolerance Patient tolerated treatment well          Past Medical History:  Diagnosis Date   A-fib (HCC)    Adenomatous polyp    Arthritis    mostly fingers (10/07/2016)   At high risk for falls    Celiac disease    I do not have the disease; 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 (10/07/2016)   Chronic back pain    worse in my neck; lower back also affected (10/07/2016)   Chronic cholecystitis 10/16/2016   Complication of anesthesia    never fully regained my taste after my cervical fusion; I don't take much RX so I'm very sensitive to any sedation (10/07/2016)   Concussion    X2   DDD (degenerative disc disease), cervical    lumbar   Depression    hx; take Prozac  to keep me stable (10/07/2016)   Dizziness    caused by compression of cervical disc   Family history of adverse reaction to anesthesia    daughters get PONV; one daughter breaks out severely; grandson got suspended in a state of anxiousness after kidney surgery; had to be held for hours   GERD (gastroesophageal reflux disease)    Hyperlipidemia    OSA on CPAP    Pneumonia    once as an adult (10/07/2016)   Thyroid nodule    Type II diabetes mellitus (HCC)    controlled by diet and exercide   Past Surgical History:  Procedure Laterality Date   ANTERIOR CERVICAL DECOMP/DISCECTOMY FUSION  2015   BACK SURGERY     CARPAL TUNNEL RELEASE Bilateral    CATARACT EXTRACTION, BILATERAL Bilateral 09/10/2014 - 09/24/2014   by Evalene Raw   CHOLECYSTECTOMY N/A  10/16/2016   Procedure: LAPAROSCOPIC CHOLECYSTECTOMY;  Surgeon: Belinda Cough, MD;  Location: [facility] OR;  Service: General;  Laterality: N/A;   LAPAROSCOPIC CHOLECYSTECTOMY  10/16/2016   LUMBAR DISC SURGERY  2007   TONSILLECTOMY  05/05/1983   TUBAL LIGATION  02/09/1979   Patient Active Problem List   Diagnosis Date Noted   Closed fracture of 9th & 10th ribs of right side 12/17/2016   Pneumothorax on right 12/17/2016   Lumbar degenerative disc disease 12/17/2016   Essential hypertension 12/17/2016   Dyslipidemia 10/07/2016   Pulmonary nodule 10/07/2016   Abnormal CXR    Chest pain 10/06/2016   S/P lumbar spinal fusion 08/05/2016   Obstructive sleep apnea syndrome, mild 10/27/2013   Controlled type 2 diabetes mellitus without complication, without long-term current use of insulin (HCC) 06/19/2011   GAD (generalized anxiety disorder) 11/01/2009    PCP: Nena Knee PA-C  REFERRING PROVIDER:  Baird Crank NP  REFERRING DIAG: M54.41 lumbago with sciatica, right side; DDD cervical   Rationale for Evaluation and Treatment: Rehabilitation  THERAPY DIAG:  Back pain; weakness ONSET DATE: June 2   SUBJECTIVE:  SUBJECTIVE STATEMENT: This is the worst it's ever been with my neck and I've had pain since my 30s.   Can you do what you did last time.  I've been using heat and the massage chair but I'm not sure about using the traction.   PERTINENT HISTORY:  spinal fusion 2018; chronic neck pain; bil carpal tunnel surgery (wears night splints); DM; HTN; chronic kidney disease; chronic dizziness  PAIN:  Are you having pain? Yes NPRS scale: 4/10   Pain location: right > left neck and thoracic region Pain orientation: Right and Left  PAIN TYPE: burning Pain description: constant  Aggravating factors:  vacuuming, sweeping, bending over, lifting, changing the sheets; ldriving, sleeping, reading Relieving factors: steroid dose pack, increased Gabapentin , wears back brace with activity, take breaks, lie in recliner  PRECAUTIONS: None   WEIGHT BEARING RESTRICTIONS: No  FALLS:  Has patient fallen in last 6 months? No  LIVING ENVIRONMENT: Lives with: lives with their family Lives in: House/apartment  OCCUPATION: retired  PLOF: Independent  PATIENT GOALS: get a handle on this; don't want it to become a big problem again    OBJECTIVE:  Note: Objective measures were completed at Evaluation unless otherwise noted.  DIAGNOSTIC FINDINGS:  No new scans of lumbar  2020: IMPRESSION: Mild spinal stenosis L2-3 with moderate subarticular stenosis bilaterally, right greater than left   Left laminectomy L3-4. Moderate subarticular and foraminal stenosis bilaterally unchanged   Left foraminal disc protrusion L4-5. Severe subarticular and foraminal stenosis on the left has progressed. Mild spinal stenosis. Moderate subarticular foraminal stenosis on the right   Severe foraminal encroachment on the left at L5-S1 due to spurring. No change from the prior study.  PATIENT SURVEYS:  Modified Oswestry 20% (pt completed first page only)  NDI: 50%  COGNITION: Overall cognitive status: Within functional limits for tasks assessed      POSTURE: decreased lumbar lordosis  PALPATION: Tender points in bil gluteals and bil piriformis CERVICAL ROM:   Active ROM A/PROM (deg) eval  Flexion 25  Extension 42  Right lateral flexion 25 pain on right  Left lateral flexion 24 pain on right  Right rotation 37 pain on right  Left rotation 25 pain on right   (Blank rows = not tested)   THORACIC ROM: Decreased rotation and extension by 50%  STRENGTH:  deep cervical flexor and extensor strength 4-/5 LUMBAR ROM:   AROM eval 9/3  Flexion 50% limited 25% limited  Extension 75% limited 50%  limited  Right lateral flexion 25% limited   Left lateral flexion 25% limited   Right rotation    Left rotation     (Blank rows = not tested)  TRUNK STRENGTH:  Decreased activation of transverse abdominus muscles; abdominals 4-/5; decreased activation of lumbar multifidi; trunk extensors 4-/5   9/3:  trunk musculature 4/5    LOWER EXTREMITY MMT:  bil hip abduction 4-/5 with pelvic drop noted with single leg standing 10 sec 9/3:  bil hip abduction and extension 4/5  FUNCTIONAL TESTS:  Able to rise from standard chair without UE assist Able to do partial squat to pick up small item from the floor  GAIT: decreased stride length (dec hip flexor lengths)   TREATMENT DATE: 01/27/24   Discussion of current status and areas of focus Discussion of home strategies for pain management Supine cervical manual distraction 10x 10 sec holds Supine suboccipital distraction 3 min Sidelying scapular mobilization medial and lateral, superior/inferior and distraction grade 3 10x each right/left Supine upper  trap stretches passively 3x right/left Supine glenohumeral distraction and Grade 1/2 oscillations inferior and posterior Prone thoracic PA mobs grade 2 Periscapular soft tissue mobilization bil  TREATMENT DATE: 01/20/24   Discussion of current status and areas of focus Home traction use: daily or even 2x/day ok; since it's static start with 5 minutes 5-8 pounds to test tolerance Home use of heat/massager chair regularly Supine cervical manual distraction 10x 10 sec holds Supine suboccipital distraction 5 min Supine upper trap stretches passively 3x right/left Supine glenohumeral distraction and Grade 1/2 oscillations inferior and posterior Prone thoracic PA mobs grade 2 Periscapular soft tissue mobilization bil  TREATMENT DATE: 01/05/24   Discussion of current status and areas of focus Seated thoracic extension with towel roll behind back and also with ball at mid back as fulcrum; hands behind  neck 2x8 Seated ball at mid tspine with lateral shifting 10x Manual therapy: soft tissue mobilization to cervical, thoracic and upper lumbar musculature; PA grade 1/2 oscillations lower t-spine 30 sec to each level Trigger Point Dry Needling Subsequent Treatment: Pt instructed on Dry Needling rational, procedures, and possible side effects. Pt instructed to expect mild to moderate muscle soreness later in the day and/or into the next day.  Pt instructed in methods to reduce muscle soreness. Patient verbalized understanding of these instructions and education.  Patient Verbal Consent Given: Yes Education Handout Provided: Previously Provided pt has had DN in the past at this faciltiy Muscles Treated:  bil cervical multifidi; bil upper traps; bil levator scap, bil rhomboids, thoracic multifidi with extra marination time Electrical Stimulation Performed: no Treatment Response/Outcome: improved soft tissue mobility; twitch responses on right which is a good prognostic indicator   TREATMENT DATE: 12/16/23   Discussion of current status and areas of focus Manual therapy: soft tissue mobilization to cervical, thoracic and upper lumbar musculature; PA grade 1/2 oscillations lower t-spine 30 sec to each level Trigger Point Dry Needling Subsequent Treatment: Pt instructed on Dry Needling rational, procedures, and possible side effects. Pt instructed to expect mild to moderate muscle soreness later in the day and/or into the next day.  Pt instructed in methods to reduce muscle soreness. Patient verbalized understanding of these instructions and education.  Patient Verbal Consent Given: Yes Education Handout Provided: Previously Provided pt has had DN in the past at this faciltiy Muscles Treated: bil cervical multifidi, bil upper traps; bil levator scap, bil rhomboids,  Electrical Stimulation Performed: no Treatment Response/Outcome: improved soft tissue mobility; twitch responses on right which is a  good prognostic indicator  TREATMENT DATE: 12/09/23   Cervical ROM Thoracic ROM NDI Manual therapy: soft tissue mobilization to thoracic musculature; PA grade 1/2 oscillations lower t-spine 30 sec to each level Trigger Point Dry Needling Subsequent Treatment: Pt instructed on Dry Needling rational, procedures, and possible side effects. Pt instructed to expect mild to moderate muscle soreness later in the day and/or into the next day.  Pt instructed in methods to reduce muscle soreness. Patient verbalized understanding of these instructions and education.  Patient Verbal Consent Given: Yes Education Handout Provided: Previously Provided pt has had DN in the past at this faciltiy Muscles Treated: bil cervical multifidi, bil upper traps, right levator scap, right rhomboids, right subscapularis, right infraspinatus (pt uses a cervical pillow for the face in prone for comfort) Electrical Stimulation Performed: no Treatment Response/Outcome: improved soft tissue mobility; twitch responses on right which is a good prognostic indicator  PATIENT EDUCATION:  Education details: Educated patient on anatomy and physiology of current  symptoms, prognosis, plan of care as well as initial self care strategies to promote recovery Person educated: Patient Education method: Explanation Education comprehension: verbalized understanding  HOME EXERCISE PROGRAM: Thoracic extension seated with towel or ball ASSESSMENT:  CLINICAL IMPRESSION: Jaspreet reports a heightening pain level with her neck chronic pain.  She is having trouble sleeping and it's affecting her ability to perform ADLs including taking care of her son. She responds well to manual techniques to cervical, scapular and thoracic areas bilaterally.  Therapist monitoring response to all interventions and modifying treatment accordingly.    OBJECTIVE IMPAIRMENTS: decreased activity tolerance, difficulty walking, decreased ROM, decreased strength,  increased fascial restrictions, impaired perceived functional ability, and pain.   ACTIVITY LIMITATIONS: lifting, bending, standing, squatting, and caring for others  PARTICIPATION LIMITATIONS: meal prep, cleaning, laundry, and community activity  PERSONAL FACTORS: 1-2 comorbidities: prior spinal pain history, diabetes are also affecting patient's functional outcome.   REHAB POTENTIAL: Good  CLINICAL DECISION MAKING: Stable/uncomplicated  EVALUATION COMPLEXITY: Low   GOALS: Goals reviewed with patient? Yes  SHORT TERM GOALS: Target date: 11/19/2023   The patient will demonstrate knowledge of basic self care strategies and exercises to promote healing and recovery of function  Baseline: Goal status: met 7/31  2.  Pt will report at least 30% improvement in symptoms with light lifting, pushing and pulling tasks, sweeping and vacuuming  Baseline:  Goal status: met 9/3  3.  The patient will have improved hip strength to at least 4/5 needed for standing, walking longer distances and descending stairs at home and in the community  Baseline:  Goal status: met 9/3  4.  The patient will have improved trunk flexor and extensor muscle strength to at least 4/5 needed for lifting light to medium weight objects such as grocery bags  Baseline:  Goal status:met 9/3   LONG TERM GOALS: Target date:02/03/2024    The patient will be independent in a safe self progression of a home exercise program to promote further recovery of function  Baseline:  Goal status: ongoing  2.  Pt will report at least 60% improvement in symptoms with light to medium lifting, changing the sheets, sweeping and vacumning  Baseline:  Goal status: ongoing  3.  The patient will have improved hip strength to at least 4+/5 needed for standing, walking longer distances and descending stairs at home and in the community  Baseline:  Goal status: ongoing  4.  The patient will have improved trunk flexor and  extensor muscle strength to at least 4+/5 needed for lifting medium weight objects such as laundry and luggage  Baseline:  Goal status: ongoing  5.  Modified Oswestry functional outcome measure score improved to    16 % indicating improved function with ADLS with less pain.   Baseline:  Goal status: ongoing  6. Neck Disability Index improved to 38% indicating improved function with less pain NEW  7. Improved cervical rotation to 45 degrees needed for driving NEW    PLAN:  PT FREQUENCY: 1x/week  PT DURATION: 8 weeks  PLANNED INTERVENTIONS: 97164- PT Re-evaluation, 97110-Therapeutic exercises, 97530- Therapeutic activity, 97112- Neuromuscular re-education, 97535- Self Care, [id number]- Manual therapy, [phone]- Aquatic Therapy, H9716- Electrical stimulation (unattended), [phone]- Electrical stimulation (manual), [id number]- Ultrasound, [id number]- Traction (mechanical), [id number]- Ionotophoresis 4mg /ml Dexamethasone , 79439 (1-2 muscles), 20561 (3+ muscles)- Dry Needling, Patient/Family education, Taping, Joint mobilization, Spinal mobilization, Cryotherapy, and Moist heat.  PLAN FOR NEXT SESSION: ERO next visit; check cervical ROM; manual techniques; DN to cervical region and periscapular  muscles; thoracic mobility seated; try UE band ex's in supine  Glade Pesa, PT 01/27/24 10:44 PM Phone: [phone] Fax: [phone]

## 2024-02-03 ENCOUNTER — Ambulatory Visit: Attending: Adult Health Nurse Practitioner | Admitting: Physical Therapy

## 2024-02-03 DIAGNOSIS — M6281 Muscle weakness (generalized): Secondary | ICD-10-CM | POA: Diagnosis present

## 2024-02-03 DIAGNOSIS — M542 Cervicalgia: Secondary | ICD-10-CM | POA: Diagnosis present

## 2024-02-03 DIAGNOSIS — M5441 Lumbago with sciatica, right side: Secondary | ICD-10-CM | POA: Insufficient documentation

## 2024-02-03 NOTE — Therapy (Signed)
 OUTPATIENT PHYSICAL THERAPY THORACOLUMBAR PROGRESS NOTE/RECERTIFICATION   Patient Name: Krista Austin MRN: [id number] DOB:January 28, 1946, 78 y.o., female Today's Date: 02/03/2024  END OF SESSION:  PT End of Session - 02/03/24 1102     Visit Number 9    Date for Recertification  04/27/24    Authorization Type Medicare ABN signed for traditional Medicare for Sept    Progress Note Due on Visit 10    PT Start Time 1102    PT Stop Time 1144    PT Time Calculation (min) 42 min    Activity Tolerance Patient tolerated treatment well          Past Medical History:  Diagnosis Date   A-fib (HCC)    Adenomatous polyp    Arthritis    mostly fingers (10/07/2016)   At high risk for falls    Celiac disease    I do not have the disease; 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 (10/07/2016)   Chronic back pain    worse in my neck; lower back also affected (10/07/2016)   Chronic cholecystitis 10/16/2016   Complication of anesthesia    never fully regained my taste after my cervical fusion; I don't take much RX so I'm very sensitive to any sedation (10/07/2016)   Concussion    X2   DDD (degenerative disc disease), cervical    lumbar   Depression    hx; take Prozac  to keep me stable (10/07/2016)   Dizziness    caused by compression of cervical disc   Family history of adverse reaction to anesthesia    daughters get PONV; one daughter breaks out severely; grandson got suspended in a state of anxiousness after kidney surgery; had to be held for hours   GERD (gastroesophageal reflux disease)    Hyperlipidemia    OSA on CPAP    Pneumonia    once as an adult (10/07/2016)   Thyroid nodule    Type II diabetes mellitus (HCC)    controlled by diet and exercide   Past Surgical History:  Procedure Laterality Date   ANTERIOR CERVICAL DECOMP/DISCECTOMY FUSION  2015   BACK SURGERY     CARPAL TUNNEL RELEASE Bilateral    CATARACT EXTRACTION, BILATERAL Bilateral 09/10/2014 - 09/24/2014   by Evalene Raw    CHOLECYSTECTOMY N/A 10/16/2016   Procedure: LAPAROSCOPIC CHOLECYSTECTOMY;  Surgeon: Belinda Cough, MD;  Location: [facility] OR;  Service: General;  Laterality: N/A;   LAPAROSCOPIC CHOLECYSTECTOMY  10/16/2016   LUMBAR DISC SURGERY  2007   TONSILLECTOMY  05/05/1983   TUBAL LIGATION  02/09/1979   Patient Active Problem List   Diagnosis Date Noted   Closed fracture of 9th & 10th ribs of right side 12/17/2016   Pneumothorax on right 12/17/2016   Lumbar degenerative disc disease 12/17/2016   Essential hypertension 12/17/2016   Dyslipidemia 10/07/2016   Pulmonary nodule 10/07/2016   Abnormal CXR    Chest pain 10/06/2016   S/P lumbar spinal fusion 08/05/2016   Obstructive sleep apnea syndrome, mild 10/27/2013   Controlled type 2 diabetes mellitus without complication, without long-term current use of insulin (HCC) 06/19/2011   GAD (generalized anxiety disorder) 11/01/2009    PCP: Nena Knee PA-C  REFERRING PROVIDER:  Baird Crank NP  REFERRING DIAG: M54.41 lumbago with sciatica, right side; DDD cervical   Rationale for Evaluation and Treatment: Rehabilitation  THERAPY DIAG:  Back pain; weakness ONSET DATE: June 2   SUBJECTIVE:  SUBJECTIVE STATEMENT: Having a neck and cervical MRI next week.  I'm doing the traction, decompression mid day, wearing lumbar support, postural support, heat, Advil . taking it easier,sleeping in the recliner  PERTINENT HISTORY:  spinal fusion 2018; chronic neck pain; bil carpal tunnel surgery (wears night splints); DM; HTN; chronic kidney disease; chronic dizziness  PAIN:  Are you having pain? Yes NPRS scale: mild now but it varies and I might be in agony/10   Pain location: right > left neck and thoracic region Pain orientation: Right and Left  PAIN TYPE:  burning Pain description: constant  Aggravating factors: vacuuming, sweeping, bending over, lifting, changing the sheets; ldriving, sleeping, reading Relieving factors: steroid dose pack, increased Gabapentin , wears back brace with activity, take breaks, lie in recliner  PRECAUTIONS: None   WEIGHT BEARING RESTRICTIONS: No  FALLS:  Has patient fallen in last 6 months? No  LIVING ENVIRONMENT: Lives with: lives with their family Lives in: House/apartment  OCCUPATION: retired  PLOF: Independent  PATIENT GOALS: get a handle on this; don't want it to become a big problem again    OBJECTIVE:  Note: Objective measures were completed at Evaluation unless otherwise noted.  DIAGNOSTIC FINDINGS:  No new scans of lumbar  2020: IMPRESSION: Mild spinal stenosis L2-3 with moderate subarticular stenosis bilaterally, right greater than left   Left laminectomy L3-4. Moderate subarticular and foraminal stenosis bilaterally unchanged   Left foraminal disc protrusion L4-5. Severe subarticular and foraminal stenosis on the left has progressed. Mild spinal stenosis. Moderate subarticular foraminal stenosis on the right   Severe foraminal encroachment on the left at L5-S1 due to spurring. No change from the prior study.  PATIENT SURVEYS:  Modified Oswestry 20% (pt completed first page only)  NDI: 50%  10/2 ODI 32% NDI 48%  COGNITION: Overall cognitive status: Within functional limits for tasks assessed      POSTURE: decreased lumbar lordosis  PALPATION: Tender points in bil gluteals and bil piriformis CERVICAL ROM:   Active ROM A/PROM (deg) eval 10/2  Flexion 25 25  Extension 42 25  Right lateral flexion 25 pain on right 18  Left lateral flexion 24 pain on right 20  Right rotation 37 pain on right 40  Left rotation 25 pain on right 38 pain   (Blank rows = not tested)   THORACIC ROM: Decreased rotation and extension by 50%  STRENGTH:  deep cervical flexor and extensor  strength 4-/5 LUMBAR ROM:   AROM eval 9/3 10/2  Flexion 50% limited 25% limited 20% limited  Extension 75% limited 50% limited 50% limited  Right lateral flexion 25% limited    Left lateral flexion 25% limited    Right rotation     Left rotation      (Blank rows = not tested)  TRUNK STRENGTH:  Decreased activation of transverse abdominus muscles; abdominals 4-/5; decreased activation of lumbar multifidi; trunk extensors 4-/5   9/3:  trunk musculature 4/5 10/2:  trunk musculature 4/5     LOWER EXTREMITY MMT:  bil hip abduction 4-/5 with pelvic drop noted with single leg standing 10 sec 9/3:  bil hip abduction and extension 4/5  FUNCTIONAL TESTS:  Able to rise from standard chair without UE assist Able to do partial squat to pick up small item from the floor  GAIT: decreased stride length (dec hip flexor lengths)  TREATMENT DATE: 02/03/24   ODI NDI Cervical, lumbar ROM Supine cervical manual distraction 10x 10 sec holds Supine suboccipital distraction 3 min Sidelying scapular mobilization medial  and lateral, superior/inferior and distraction grade 3 10x each right/left Supine upper trap stretches passively 3x right/left Prone thoracic PA mobs grade 2 Periscapular soft tissue mobilization bil  TREATMENT DATE: 01/27/24   Discussion of current status and areas of focus Discussion of home strategies for pain management Supine cervical manual distraction 10x 10 sec holds Supine suboccipital distraction 3 min Sidelying scapular mobilization medial and lateral, superior/inferior and distraction grade 3 10x each right/left Supine upper trap stretches passively 3x right/left Supine glenohumeral distraction and Grade 1/2 oscillations inferior and posterior Prone thoracic PA mobs grade 2 Periscapular soft tissue mobilization bil  TREATMENT DATE: 01/20/24   Discussion of current status and areas of focus Home traction use: daily or even 2x/day ok; since it's static start with 5  minutes 5-8 pounds to test tolerance Home use of heat/massager chair regularly Supine cervical manual distraction 10x 10 sec holds Supine suboccipital distraction 5 min Supine upper trap stretches passively 3x right/left Supine glenohumeral distraction and Grade 1/2 oscillations inferior and posterior Prone thoracic PA mobs grade 2 Periscapular soft tissue mobilization bil  TREATMENT DATE: 01/05/24   Discussion of current status and areas of focus Seated thoracic extension with towel roll behind back and also with ball at mid back as fulcrum; hands behind neck 2x8 Seated ball at mid tspine with lateral shifting 10x Manual therapy: soft tissue mobilization to cervical, thoracic and upper lumbar musculature; PA grade 1/2 oscillations lower t-spine 30 sec to each level Trigger Point Dry Needling Subsequent Treatment: Pt instructed on Dry Needling rational, procedures, and possible side effects. Pt instructed to expect mild to moderate muscle soreness later in the day and/or into the next day.  Pt instructed in methods to reduce muscle soreness. Patient verbalized understanding of these instructions and education.  Patient Verbal Consent Given: Yes Education Handout Provided: Previously Provided pt has had DN in the past at this faciltiy Muscles Treated:  bil cervical multifidi; bil upper traps; bil levator scap, bil rhomboids, thoracic multifidi with extra marination time Electrical Stimulation Performed: no Treatment Response/Outcome: improved soft tissue mobility; twitch responses on right which is a good prognostic indicator   TREATMENT DATE: 12/16/23   Discussion of current status and areas of focus Manual therapy: soft tissue mobilization to cervical, thoracic and upper lumbar musculature; PA grade 1/2 oscillations lower t-spine 30 sec to each level Trigger Point Dry Needling Subsequent Treatment: Pt instructed on Dry Needling rational, procedures, and possible side effects. Pt  instructed to expect mild to moderate muscle soreness later in the day and/or into the next day.  Pt instructed in methods to reduce muscle soreness. Patient verbalized understanding of these instructions and education.  Patient Verbal Consent Given: Yes Education Handout Provided: Previously Provided pt has had DN in the past at this faciltiy Muscles Treated: bil cervical multifidi, bil upper traps; bil levator scap, bil rhomboids,  Electrical Stimulation Performed: no Treatment Response/Outcome: improved soft tissue mobility; twitch responses on right which is a good prognostic indicator  PATIENT EDUCATION:  Education details: Educated patient on anatomy and physiology of current symptoms, prognosis, plan of care as well as initial self care strategies to promote recovery Person educated: Patient Education method: Explanation Education comprehension: verbalized understanding  HOME EXERCISE PROGRAM: Thoracic extension seated with towel or ball ASSESSMENT:  CLINICAL IMPRESSION: Minimal change in functional outcomes and variable ROM measurements based on symptom irritability.  Although progress is limited, the patient's condition demonstrates that skilled care is necessary for the performance of a safe and  effective program to maintain current condition and prevent deterioration.  Without PT the patient she  would have difficulty taking care of her son ( she is the primary caregiver).  She will be undergoing further imaging of her upper and mid spine and will continue treatment as indicated.     OBJECTIVE IMPAIRMENTS: decreased activity tolerance, difficulty walking, decreased ROM, decreased strength, increased fascial restrictions, impaired perceived functional ability, and pain.   ACTIVITY LIMITATIONS: lifting, bending, standing, squatting, and caring for others  PARTICIPATION LIMITATIONS: meal prep, cleaning, laundry, and community activity  PERSONAL FACTORS: 1-2 comorbidities: prior  spinal pain history, diabetes are also affecting patient's functional outcome.   REHAB POTENTIAL: Good  CLINICAL DECISION MAKING: Stable/uncomplicated  EVALUATION COMPLEXITY: Low   GOALS: Goals reviewed with patient? Yes  SHORT TERM GOALS: Target date: 11/19/2023   The patient will demonstrate knowledge of basic self care strategies and exercises to promote healing and recovery of function  Baseline: Goal status: met 7/31  2.  Pt will report at least 30% improvement in symptoms with light lifting, pushing and pulling tasks, sweeping and vacuuming  Baseline:  Goal status: met 9/3  3.  The patient will have improved hip strength to at least 4/5 needed for standing, walking longer distances and descending stairs at home and in the community  Baseline:  Goal status: met 9/3  4.  The patient will have improved trunk flexor and extensor muscle strength to at least 4/5 needed for lifting light to medium weight objects such as grocery bags  Baseline:  Goal status:met 9/3   LONG TERM GOALS: Target date:04/27/2024      The patient will be independent in a safe self progression of a home exercise program to promote further recovery of function  Baseline:  Goal status: ongoing  2.  Pt will report at least 60% improvement in symptoms with light to medium lifting, changing the sheets, sweeping and vacumning  Baseline:  Goal status: ongoing  3.  The patient will have improved hip strength to at least 4+/5 needed for standing, walking longer distances and descending stairs at home and in the community  Baseline:  Goal status: ongoing  4.  The patient will have improved trunk flexor and extensor muscle strength to at least 4+/5 needed for lifting medium weight objects such as laundry and luggage  Baseline:  Goal status: ongoing  5.  Modified Oswestry functional outcome measure score improved to    16 % indicating improved function with ADLS with less pain.   Baseline:  Goal  status: ongoing  6. Neck Disability Index improved to 38% indicating improved function with less pain ongoing 7. Improved cervical rotation to 45 degrees needed for driving ongoing  8. Sleep improved by 25%, able to sleep in the bed new    PLAN:  PT FREQUENCY: 1x/week  PT DURATION: 8 weeks  PLANNED INTERVENTIONS: 97164- PT Re-evaluation, 97110-Therapeutic exercises, 97530- Therapeutic activity, 97112- Neuromuscular re-education, 97535- Self Care, [id number]- Manual therapy, [id number]- Aquatic Therapy, [id number]- Electrical stimulation (unattended), [phone]- Electrical stimulation (manual), [id number]- Ultrasound, [id number]- Traction (mechanical), [id number]- Ionotophoresis 4mg /ml Dexamethasone , 79439 (1-2 muscles), [id number] (3+ muscles)- Dry Needling, Patient/Family education, Taping, Joint mobilization, Spinal mobilization, Cryotherapy, and Moist heat.  PLAN FOR NEXT SESSION:  check for imaging results; manual techniques; DN to cervical region and periscapular muscles; thoracic mobility   Glade Pesa, PT 02/03/24 10:26 PM Phone: [phone] Fax: [phone]

## 2024-02-17 ENCOUNTER — Ambulatory Visit: Admitting: Physical Therapy

## 2024-02-22 ENCOUNTER — Ambulatory Visit: Admitting: Physical Therapy

## 2024-02-22 DIAGNOSIS — M6281 Muscle weakness (generalized): Secondary | ICD-10-CM

## 2024-02-22 DIAGNOSIS — M5441 Lumbago with sciatica, right side: Secondary | ICD-10-CM

## 2024-02-22 DIAGNOSIS — M542 Cervicalgia: Secondary | ICD-10-CM

## 2024-02-22 NOTE — Therapy (Signed)
 OUTPATIENT PHYSICAL THERAPY THORACOLUMBAR PROGRESS NOTE   Patient Name: Krista Austin MRN: [id number] DOB:1946-03-08, 78 y.o., female Today's Date: 02/22/2024  Progress Note Reporting Period 6/20 to 02/22/24  See note below for Objective Data and Assessment of Progress/Goals.      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 (10/07/2016)   Chronic back pain    worse in my neck; lower back also affected (10/07/2016)   Chronic cholecystitis 10/16/2016   Complication of anesthesia    never fully regained my taste after my cervical fusion; I don't take much RX so I'm very sensitive to any sedation (10/07/2016)   Concussion    X2   DDD (degenerative disc disease), cervical    lumbar   Depression    hx; take Prozac  to keep me stable (10/07/2016)   Dizziness    caused by compression of cervical disc   Family history of adverse reaction to anesthesia    daughters get PONV; one daughter breaks out severely; grandson got suspended in a state of anxiousness after kidney surgery; had to be held for hours   GERD (gastroesophageal reflux disease)    Hyperlipidemia    OSA on CPAP    Pneumonia    once as an adult (10/07/2016)   Thyroid nodule    Type II diabetes mellitus (HCC)    controlled by diet and exercide   Past Surgical History:  Procedure Laterality Date   ANTERIOR CERVICAL DECOMP/DISCECTOMY FUSION  2015   BACK SURGERY     CARPAL  TUNNEL RELEASE Bilateral    CATARACT EXTRACTION, BILATERAL Bilateral 09/10/2014 - 09/24/2014   by Evalene Raw   CHOLECYSTECTOMY N/A 10/16/2016   Procedure: LAPAROSCOPIC CHOLECYSTECTOMY;  Surgeon: Belinda Cough, MD;  Location: [facility] OR;  Service: General;  Laterality: N/A;   LAPAROSCOPIC CHOLECYSTECTOMY  10/16/2016   LUMBAR DISC SURGERY  2007   TONSILLECTOMY  05/05/1983   TUBAL LIGATION  02/09/1979   Patient Active Problem List   Diagnosis Date Noted   Closed fracture of 9th & 10th ribs of right side 12/17/2016   Pneumothorax on right 12/17/2016   Lumbar degenerative disc disease 12/17/2016   Essential hypertension 12/17/2016   Dyslipidemia 10/07/2016   Pulmonary nodule 10/07/2016   Abnormal CXR    Chest pain 10/06/2016   S/P lumbar spinal fusion 08/05/2016   Obstructive sleep apnea syndrome, mild 10/27/2013   Controlled type 2 diabetes mellitus without complication, without long-term current use of insulin (HCC) 06/19/2011   GAD (generalized anxiety disorder) 11/01/2009    PCP: Nena Knee PA-C  REFERRING PROVIDER:  Baird Crank NP  REFERRING DIAG: M54.41 lumbago with sciatica, right side; DDD cervical   Rationale for Evaluation and Treatment: Rehabilitation  THERAPY DIAG:  Back pain;  weakness ONSET DATE: June 2   SUBJECTIVE:                                                                                                                                                                                           SUBJECTIVE STATEMENT: Sleeping sitting up but did get a new power chair recliner which will help with night time mobility.  The neck is more critical than the back.  Will get MRI results on Thursday.  The home traction seem to make it worse so I've stopped that.   PERTINENT HISTORY:  spinal fusion 2018; chronic neck pain; bil carpal tunnel surgery (wears night splints); DM; HTN; chronic kidney disease; chronic dizziness  PAIN:  Are you having pain?  Yes NPRS scale: 3-4/10   Pain location: right > left neck and thoracic region Pain orientation: Right and Left  PAIN TYPE: burning Pain description: constant  Aggravating factors: vacuuming, sweeping, bending over, lifting, changing the sheets; ldriving, sleeping, reading Relieving factors: steroid dose pack, increased Gabapentin , wears back brace with activity, take breaks, lie in recliner  PRECAUTIONS: None   WEIGHT BEARING RESTRICTIONS: No  FALLS:  Has patient fallen in last 6 months? No  LIVING ENVIRONMENT: Lives with: lives with their family Lives in: House/apartment  OCCUPATION: retired  PLOF: Independent  PATIENT GOALS: get a handle on this; don't want it to become a big problem again    OBJECTIVE:  Note: Objective measures were completed at Evaluation unless otherwise noted.  DIAGNOSTIC FINDINGS:  No new scans of lumbar  2020: IMPRESSION: Mild spinal stenosis L2-3 with moderate subarticular stenosis bilaterally, right greater than left   Left laminectomy L3-4. Moderate subarticular and foraminal stenosis bilaterally unchanged   Left foraminal disc protrusion L4-5. Severe subarticular and foraminal stenosis on the left has progressed. Mild spinal stenosis. Moderate subarticular foraminal stenosis on the right   Severe foraminal encroachment on the left at L5-S1 due to spurring. No change from the prior study.  PATIENT SURVEYS:  Modified Oswestry 20% (pt completed first page only)  NDI: 50%  10/2 ODI 32% NDI 48%  COGNITION: Overall cognitive status: Within functional limits for tasks assessed      POSTURE: decreased lumbar lordosis  PALPATION: Tender points in bil gluteals and bil piriformis CERVICAL ROM:   Active ROM A/PROM (deg) eval 10/2 10/21  Flexion 25 25 25   Extension 42 25 25  Right lateral flexion 25 pain on right 18 18  Left lateral flexion 24 pain on right 20 20  Right rotation 37 pain on right 40 40  Left rotation 25 pain on  right 38 pain 38   (  Blank rows = not tested)   THORACIC ROM: Decreased rotation and extension by 50%  STRENGTH:  deep cervical flexor and extensor strength 4-/5 LUMBAR ROM:   AROM eval 9/3 10/2  Flexion 50% limited 25% limited 20% limited  Extension 75% limited 50% limited 50% limited  Right lateral flexion 25% limited    Left lateral flexion 25% limited    Right rotation     Left rotation      (Blank rows = not tested)  TRUNK STRENGTH:  Decreased activation of transverse abdominus muscles; abdominals 4-/5; decreased activation of lumbar multifidi; trunk extensors 4-/5   9/3:  trunk musculature 4/5 10/2:  trunk musculature 4/5     LOWER EXTREMITY MMT:  bil hip abduction 4-/5 with pelvic drop noted with single leg standing 10 sec 9/3:  bil hip abduction and extension 4/5  FUNCTIONAL TESTS:  Able to rise from standard chair without UE assist Able to do partial squat to pick up small item from the floor  GAIT: decreased stride length (dec hip flexor lengths)    TREATMENT DATE: 02/22/24   Discussion of cervical radiculopathy symptoms and which nerves may be involved Supine cervical manual distraction 10x 10 sec holds Supine suboccipital distraction 3 min Sidelying scapular mobilization medial and lateral, superior/inferior and distraction grade 3 10x each right/left Supine upper trap stretches passively 3x right/left Prone thoracic PA mobs grade 2 Periscapular soft tissue mobilization bil  TREATMENT DATE: 02/03/24   ODI NDI Cervical, lumbar ROM Supine cervical manual distraction 10x 10 sec holds Supine suboccipital distraction 3 min Sidelying scapular mobilization medial and lateral, superior/inferior and distraction grade 3 10x each right/left Supine upper trap stretches passively 3x right/left Prone thoracic PA mobs grade 2 Periscapular soft tissue mobilization bil  TREATMENT DATE: 01/27/24   Discussion of current status and areas of focus Discussion of home  strategies for pain management Supine cervical manual distraction 10x 10 sec holds Supine suboccipital distraction 3 min Sidelying scapular mobilization medial and lateral, superior/inferior and distraction grade 3 10x each right/left Supine upper trap stretches passively 3x right/left Supine glenohumeral distraction and Grade 1/2 oscillations inferior and posterior Prone thoracic PA mobs grade 2 Periscapular soft tissue mobilization bil  TREATMENT DATE: 01/20/24   Discussion of current status and areas of focus Home traction use: daily or even 2x/day ok; since it's static start with 5 minutes 5-8 pounds to test tolerance Home use of heat/massager chair regularly Supine cervical manual distraction 10x 10 sec holds Supine suboccipital distraction 5 min Supine upper trap stretches passively 3x right/left Supine glenohumeral distraction and Grade 1/2 oscillations inferior and posterior Prone thoracic PA mobs grade 2 Periscapular soft tissue mobilization bil  TREATMENT DATE: 01/05/24   Discussion of current status and areas of focus Seated thoracic extension with towel roll behind back and also with ball at mid back as fulcrum; hands behind neck 2x8 Seated ball at mid tspine with lateral shifting 10x Manual therapy: soft tissue mobilization to cervical, thoracic and upper lumbar musculature; PA grade 1/2 oscillations lower t-spine 30 sec to each level Trigger Point Dry Needling Subsequent Treatment: Pt instructed on Dry Needling rational, procedures, and possible side effects. Pt instructed to expect mild to moderate muscle soreness later in the day and/or into the next day.  Pt instructed in methods to reduce muscle soreness. Patient verbalized understanding of these instructions and education.  Patient Verbal Consent Given: Yes Education Handout Provided: Previously Provided pt has had DN in the past at this faciltiy Muscles  Treated:  bil cervical multifidi; bil upper traps; bil levator  scap, bil rhomboids, thoracic multifidi with extra marination time Electrical Stimulation Performed: no Treatment Response/Outcome: improved soft tissue mobility; twitch responses on right which is a good prognostic indicator   TREATMENT DATE: 12/16/23   Discussion of current status and areas of focus Manual therapy: soft tissue mobilization to cervical, thoracic and upper lumbar musculature; PA grade 1/2 oscillations lower t-spine 30 sec to each level Trigger Point Dry Needling Subsequent Treatment: Pt instructed on Dry Needling rational, procedures, and possible side effects. Pt instructed to expect mild to moderate muscle soreness later in the day and/or into the next day.  Pt instructed in methods to reduce muscle soreness. Patient verbalized understanding of these instructions and education.  Patient Verbal Consent Given: Yes Education Handout Provided: Previously Provided pt has had DN in the past at this faciltiy Muscles Treated: bil cervical multifidi, bil upper traps; bil levator scap, bil rhomboids,  Electrical Stimulation Performed: no Treatment Response/Outcome: improved soft tissue mobility; twitch responses on right which is a good prognostic indicator  PATIENT EDUCATION:  Education details: Educated patient on anatomy and physiology of current symptoms, prognosis, plan of care as well as initial self care strategies to promote recovery Person educated: Patient Education method: Explanation Education comprehension: verbalized understanding  HOME EXERCISE PROGRAM: Thoracic extension seated with towel or ball ASSESSMENT:  CLINICAL IMPRESSION: Deyanira continues to have high intensity pain in her neck.   She will be seeing the doctor on Thursday for MRI results and to see if there are options for medical management.  She reports some relief following manual therapy. Therapist monitoring response to all interventions and modifying treatment accordingly.   Although progress is  limited, the patient's condition demonstrates that skilled care is necessary for the performance of a safe and effective program to maintain current condition and prevent deterioration.  Without PT the patient she  would have difficulty taking care of her son ( she is the primary caregiver).    OBJECTIVE IMPAIRMENTS: decreased activity tolerance, difficulty walking, decreased ROM, decreased strength, increased fascial restrictions, impaired perceived functional ability, and pain.   ACTIVITY LIMITATIONS: lifting, bending, standing, squatting, and caring for others  PARTICIPATION LIMITATIONS: meal prep, cleaning, laundry, and community activity  PERSONAL FACTORS: 1-2 comorbidities: prior spinal pain history, diabetes are also affecting patient's functional outcome.   REHAB POTENTIAL: Good  CLINICAL DECISION MAKING: Stable/uncomplicated  EVALUATION COMPLEXITY: Low   GOALS: Goals reviewed with patient? Yes  SHORT TERM GOALS: Target date: 11/19/2023   The patient will demonstrate knowledge of basic self care strategies and exercises to promote healing and recovery of function  Baseline: Goal status: met 7/31  2.  Pt will report at least 30% improvement in symptoms with light lifting, pushing and pulling tasks, sweeping and vacuuming  Baseline:  Goal status: met 9/3  3.  The patient will have improved hip strength to at least 4/5 needed for standing, walking longer distances and descending stairs at home and in the community  Baseline:  Goal status: met 9/3  4.  The patient will have improved trunk flexor and extensor muscle strength to at least 4/5 needed for lifting light to medium weight objects such as grocery bags  Baseline:  Goal status:met 9/3   LONG TERM GOALS: Target date:04/27/2024      The patient will be independent in a safe self progression of a home exercise program to promote further recovery of function  Baseline:  Goal status: ongoing  2.  Pt will report  at least 60% improvement in symptoms with light to medium lifting, changing the sheets, sweeping and vacumning  Baseline:  Goal status: ongoing  3.  The patient will have improved hip strength to at least 4+/5 needed for standing, walking longer distances and descending stairs at home and in the community  Baseline:  Goal status: ongoing  4.  The patient will have improved trunk flexor and extensor muscle strength to at least 4+/5 needed for lifting medium weight objects such as laundry and luggage  Baseline:  Goal status: ongoing  5.  Modified Oswestry functional outcome measure score improved to    16 % indicating improved function with ADLS with less pain.   Baseline:  Goal status: ongoing  6. Neck Disability Index improved to 38% indicating improved function with less pain ongoing 7. Improved cervical rotation to 45 degrees needed for driving ongoing  8. Sleep improved by 25%, able to sleep in the bed new    PLAN:  PT FREQUENCY: 1x/week  PT DURATION: 8 weeks  PLANNED INTERVENTIONS: 97164- PT Re-evaluation, 97110-Therapeutic exercises, 97530- Therapeutic activity, 97112- Neuromuscular re-education, 97535- Self Care, [id number]- Manual therapy, [phone]- Aquatic Therapy, [id number]- Electrical stimulation (unattended), [phone]- Electrical stimulation (manual), [id number]- Ultrasound, [id number]- Traction (mechanical), [id number]- Ionotophoresis 4mg /ml Dexamethasone , 79439 (1-2 muscles), [id number] (3+ muscles)- Dry Needling, Patient/Family education, Taping, Joint mobilization, Spinal mobilization, Cryotherapy, and Moist heat.  PLAN FOR NEXT SESSION:  sees the doctor on Thursday for imaging results; manual techniques; DN to cervical region and periscapular muscles; thoracic mobility  Glade Pesa, PT 02/22/24 9:27 PM Phone: [phone] Fax: [phone]

## 2024-02-24 ENCOUNTER — Encounter: Admitting: Physical Therapy

## 2024-03-02 ENCOUNTER — Encounter: Payer: Self-pay | Admitting: Physical Therapy

## 2024-03-02 ENCOUNTER — Ambulatory Visit: Admitting: Physical Therapy

## 2024-03-02 DIAGNOSIS — M542 Cervicalgia: Secondary | ICD-10-CM | POA: Diagnosis not present

## 2024-03-02 DIAGNOSIS — M6281 Muscle weakness (generalized): Secondary | ICD-10-CM

## 2024-03-02 DIAGNOSIS — M5441 Lumbago with sciatica, right side: Secondary | ICD-10-CM

## 2024-03-02 NOTE — Therapy (Signed)
 OUTPATIENT PHYSICAL THERAPY THORACOLUMBAR PROGRESS NOTE   Patient Name: Krista Austin MRN: [id number] DOB:1945-09-21, 78 y.o., female Today's Date: 03/02/2024    END OF SESSION:  PT End of Session - 03/02/24 1018     Visit Number 11    Date for Recertification  04/27/24    Authorization Type Medicare ABN signed for traditional Medicare for Sept    Progress Note Due on Visit 20    Activity Tolerance Patient tolerated treatment well          Past Medical History:  Diagnosis Date   A-fib (HCC)    Adenomatous polyp    Arthritis    mostly fingers (10/07/2016)   At high risk for falls    Celiac disease    I do not have the disease; 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 (10/07/2016)   Chronic back pain    worse in my neck; lower back also affected (10/07/2016)   Chronic cholecystitis 10/16/2016   Complication of anesthesia    never fully regained my taste after my cervical fusion; I don't take much RX so I'm very sensitive to any sedation (10/07/2016)   Concussion    X2   DDD (degenerative disc disease), cervical    lumbar   Depression    hx; take Prozac  to keep me stable (10/07/2016)   Dizziness    caused by compression of cervical disc   Family history of adverse reaction to anesthesia    daughters get PONV; one daughter breaks out severely; grandson got suspended in a state of anxiousness after kidney surgery; had to be held for hours   GERD (gastroesophageal reflux disease)    Hyperlipidemia    OSA on CPAP    Pneumonia    once as an adult (10/07/2016)   Thyroid nodule    Type II diabetes mellitus (HCC)    controlled by diet and exercide   Past Surgical History:  Procedure Laterality Date   ANTERIOR CERVICAL DECOMP/DISCECTOMY FUSION  2015   BACK SURGERY     CARPAL TUNNEL RELEASE Bilateral    CATARACT EXTRACTION, BILATERAL Bilateral 09/10/2014 - 09/24/2014   by Evalene Raw   CHOLECYSTECTOMY N/A 10/16/2016   Procedure: LAPAROSCOPIC CHOLECYSTECTOMY;  Surgeon: Belinda Cough, MD;  Location: [facility] OR;  Service: General;  Laterality: N/A;   LAPAROSCOPIC CHOLECYSTECTOMY  10/16/2016   LUMBAR DISC SURGERY  2007   TONSILLECTOMY  05/05/1983   TUBAL LIGATION  02/09/1979   Patient Active Problem List   Diagnosis Date Noted   Closed fracture of 9th & 10th ribs of right side 12/17/2016   Pneumothorax on right 12/17/2016   Lumbar degenerative disc disease 12/17/2016   Essential hypertension 12/17/2016   Dyslipidemia 10/07/2016   Pulmonary nodule 10/07/2016   Abnormal CXR    Chest pain 10/06/2016   S/P lumbar spinal fusion 08/05/2016   Obstructive sleep apnea syndrome, mild 10/27/2013   Controlled type 2 diabetes mellitus without complication, without long-term current use of insulin (HCC) 06/19/2011   GAD (generalized anxiety disorder) 11/01/2009    PCP: Nena Knee PA-C  REFERRING PROVIDER:  Baird Crank NP  REFERRING DIAG: M54.41 lumbago with sciatica, right side; DDD cervical   Rationale for Evaluation and Treatment: Rehabilitation  THERAPY DIAG:  Back pain; weakness ONSET DATE: June 2   SUBJECTIVE:  SUBJECTIVE STATEMENT: Right sciatica has been bothering me.  I think we need to do DN on that area.  Having cervical injection on Monday.  Pt brought MRI report.   PERTINENT HISTORY:  spinal fusion 2018; chronic neck pain; bil carpal tunnel surgery (wears night splints); DM; HTN; chronic kidney disease; chronic dizziness  PAIN:  Are you having pain? Yes NPRS scale: 5-6/10  neck and mid back, right buttock Pain location: right > left neck and thoracic region Pain orientation: Right and Left  PAIN TYPE: burning Pain description: constant  Aggravating factors: vacuuming, sweeping, bending over, lifting, changing the sheets; ldriving, sleeping,  reading Relieving factors: steroid dose pack, increased Gabapentin , wears back brace with activity, take breaks, lie in recliner  PRECAUTIONS: None   WEIGHT BEARING RESTRICTIONS: No  FALLS:  Has patient fallen in last 6 months? No  LIVING ENVIRONMENT: Lives with: lives with their family Lives in: House/apartment  OCCUPATION: retired  PLOF: Independent  PATIENT GOALS: get a handle on this; don't want it to become a big problem again    OBJECTIVE:  Note: Objective measures were completed at Evaluation unless otherwise noted.  DIAGNOSTIC FINDINGS:  No new scans of lumbar  2020: IMPRESSION: Mild spinal stenosis L2-3 with moderate subarticular stenosis bilaterally, right greater than left   Left laminectomy L3-4. Moderate subarticular and foraminal stenosis bilaterally unchanged   Left foraminal disc protrusion L4-5. Severe subarticular and foraminal stenosis on the left has progressed. Mild spinal stenosis. Moderate subarticular foraminal stenosis on the right   Severe foraminal encroachment on the left at L5-S1 due to spurring. No change from the prior study.  PATIENT SURVEYS:  Modified Oswestry 20% (pt completed first page only)  NDI: 50%  10/2 ODI 32% NDI 48%  COGNITION: Overall cognitive status: Within functional limits for tasks assessed      POSTURE: decreased lumbar lordosis  PALPATION: Tender points in bil gluteals and bil piriformis CERVICAL ROM:   Active ROM A/PROM (deg) eval 10/2 10/21  Flexion 25 25 25   Extension 42 25 25  Right lateral flexion 25 pain on right 18 18  Left lateral flexion 24 pain on right 20 20  Right rotation 37 pain on right 40 40  Left rotation 25 pain on right 38 pain 38   (Blank rows = not tested)   THORACIC ROM: Decreased rotation and extension by 50%  STRENGTH:  deep cervical flexor and extensor strength 4-/5 LUMBAR ROM:   AROM eval 9/3 10/2  Flexion 50% limited 25% limited 20% limited  Extension 75% limited  50% limited 50% limited  Right lateral flexion 25% limited    Left lateral flexion 25% limited    Right rotation     Left rotation      (Blank rows = not tested)  TRUNK STRENGTH:  Decreased activation of transverse abdominus muscles; abdominals 4-/5; decreased activation of lumbar multifidi; trunk extensors 4-/5   9/3:  trunk musculature 4/5 10/2:  trunk musculature 4/5     LOWER EXTREMITY MMT:  bil hip abduction 4-/5 with pelvic drop noted with single leg standing 10 sec 9/3:  bil hip abduction and extension 4/5  FUNCTIONAL TESTS:  Able to rise from standard chair without UE assist Able to do partial squat to pick up small item from the floor  GAIT: decreased stride length (dec hip flexor lengths)   TREATMENT DATE: 03/02/24   Discussion of MRI results and plan for today's session Trigger Point Dry Needling Subsequent Treatment: Instructions provided previously at  initial dry needling treatment.  Patient Verbal Consent Given: Yes Education Handout Provided: Yes Muscles Treated: right lumbar multifidi, right gluteals Electrical Stimulation Performed: Yes, Parameters: 2.0 ma 15 min  Treatment Response/Outcome: improved soft tissue mobility Sidelying scapular mobilization medial and lateral, superior/inferior and distraction grade 3 10x each right Supine GH distraction right grade 1/2 10x Prone thoracic PA mobs grade 2 Periscapular soft tissue mobilization bil  TREATMENT DATE: 02/22/24   Discussion of cervical radiculopathy symptoms and which nerves may be involved Supine cervical manual distraction 10x 10 sec holds Supine suboccipital distraction 3 min Sidelying scapular mobilization medial and lateral, superior/inferior and distraction grade 3 10x each right/left Supine upper trap stretches passively 3x right/left Prone thoracic PA mobs grade 2 Periscapular soft tissue mobilization bil  TREATMENT DATE: 02/03/24   ODI NDI Cervical, lumbar ROM Supine cervical manual  distraction 10x 10 sec holds Supine suboccipital distraction 3 min Sidelying scapular mobilization medial and lateral, superior/inferior and distraction grade 3 10x each right/left Supine upper trap stretches passively 3x right/left Prone thoracic PA mobs grade 2 Periscapular soft tissue mobilization bil  TREATMENT DATE: 01/27/24   Discussion of current status and areas of focus Discussion of home strategies for pain management Supine cervical manual distraction 10x 10 sec holds Supine suboccipital distraction 3 min Sidelying scapular mobilization medial and lateral, superior/inferior and distraction grade 3 10x each right/left Supine upper trap stretches passively 3x right/left Supine glenohumeral distraction and Grade 1/2 oscillations inferior and posterior Prone thoracic PA mobs grade 2 Periscapular soft tissue mobilization bil  TREATMENT DATE: 01/20/24   Discussion of current status and areas of focus Home traction use: daily or even 2x/day ok; since it's static start with 5 minutes 5-8 pounds to test tolerance Home use of heat/massager chair regularly Supine cervical manual distraction 10x 10 sec holds Supine suboccipital distraction 5 min Supine upper trap stretches passively 3x right/left Supine glenohumeral distraction and Grade 1/2 oscillations inferior and posterior Prone thoracic PA mobs grade 2 Periscapular soft tissue mobilization bil  TREATMENT DATE: 01/05/24   Discussion of current status and areas of focus Seated thoracic extension with towel roll behind back and also with ball at mid back as fulcrum; hands behind neck 2x8 Seated ball at mid tspine with lateral shifting 10x Manual therapy: soft tissue mobilization to cervical, thoracic and upper lumbar musculature; PA grade 1/2 oscillations lower t-spine 30 sec to each level Trigger Point Dry Needling Subsequent Treatment: Pt instructed on Dry Needling rational, procedures, and possible side effects. Pt instructed to  expect mild to moderate muscle soreness later in the day and/or into the next day.  Pt instructed in methods to reduce muscle soreness. Patient verbalized understanding of these instructions and education.  Patient Verbal Consent Given: Yes Education Handout Provided: Previously Provided pt has had DN in the past at this faciltiy Muscles Treated:  bil cervical multifidi; bil upper traps; bil levator scap, bil rhomboids, thoracic multifidi with extra marination time Electrical Stimulation Performed: no Treatment Response/Outcome: improved soft tissue mobility; twitch responses on right which is a good prognostic indicator   TREATMENT DATE: 12/16/23   Discussion of current status and areas of focus Manual therapy: soft tissue mobilization to cervical, thoracic and upper lumbar musculature; PA grade 1/2 oscillations lower t-spine 30 sec to each level Trigger Point Dry Needling Subsequent Treatment: Pt instructed on Dry Needling rational, procedures, and possible side effects. Pt instructed to expect mild to moderate muscle soreness later in the day and/or into the next day.  Pt instructed in methods to reduce muscle soreness. Patient verbalized understanding of these instructions and education.  Patient Verbal Consent Given: Yes Education Handout Provided: Previously Provided pt has had DN in the past at this faciltiy Muscles Treated: bil cervical multifidi, bil upper traps; bil levator scap, bil rhomboids,  Electrical Stimulation Performed: no Treatment Response/Outcome: improved soft tissue mobility; twitch responses on right which is a good prognostic indicator  PATIENT EDUCATION:  Education details: Educated patient on anatomy and physiology of current symptoms, prognosis, plan of care as well as initial self care strategies to promote recovery Person educated: Patient Education method: Explanation Education comprehension: verbalized understanding  HOME EXERCISE PROGRAM: Thoracic  extension seated with towel or ball ASSESSMENT:  CLINICAL IMPRESSION: Electrical stimulation added to indwelling needles for further impacts on pain relief and neuromuscular changes in the L5 radicular pattern. Manual techniques to the right upper quarter particular scapular, thoracic and glenohumeral regions.  Therapist monitoring response to all interventions and modifying treatment accordingly.   OBJECTIVE IMPAIRMENTS: decreased activity tolerance, difficulty walking, decreased ROM, decreased strength, increased fascial restrictions, impaired perceived functional ability, and pain.   ACTIVITY LIMITATIONS: lifting, bending, standing, squatting, and caring for others  PARTICIPATION LIMITATIONS: meal prep, cleaning, laundry, and community activity  PERSONAL FACTORS: 1-2 comorbidities: prior spinal pain history, diabetes are also affecting patient's functional outcome.   REHAB POTENTIAL: Good  CLINICAL DECISION MAKING: Stable/uncomplicated  EVALUATION COMPLEXITY: Low   GOALS: Goals reviewed with patient? Yes  SHORT TERM GOALS: Target date: 11/19/2023   The patient will demonstrate knowledge of basic self care strategies and exercises to promote healing and recovery of function  Baseline: Goal status: met 7/31  2.  Pt will report at least 30% improvement in symptoms with light lifting, pushing and pulling tasks, sweeping and vacuuming  Baseline:  Goal status: met 9/3  3.  The patient will have improved hip strength to at least 4/5 needed for standing, walking longer distances and descending stairs at home and in the community  Baseline:  Goal status: met 9/3  4.  The patient will have improved trunk flexor and extensor muscle strength to at least 4/5 needed for lifting light to medium weight objects such as grocery bags  Baseline:  Goal status:met 9/3   LONG TERM GOALS: Target date:04/27/2024      The patient will be independent in a safe self progression of a home  exercise program to promote further recovery of function  Baseline:  Goal status: ongoing  2.  Pt will report at least 60% improvement in symptoms with light to medium lifting, changing the sheets, sweeping and vacumning  Baseline:  Goal status: ongoing  3.  The patient will have improved hip strength to at least 4+/5 needed for standing, walking longer distances and descending stairs at home and in the community  Baseline:  Goal status: ongoing  4.  The patient will have improved trunk flexor and extensor muscle strength to at least 4+/5 needed for lifting medium weight objects such as laundry and luggage  Baseline:  Goal status: ongoing  5.  Modified Oswestry functional outcome measure score improved to    16 % indicating improved function with ADLS with less pain.   Baseline:  Goal status: ongoing  6. Neck Disability Index improved to 38% indicating improved function with less pain ongoing 7. Improved cervical rotation to 45 degrees needed for driving ongoing  8. Sleep improved by 25%, able to sleep in the bed new    PLAN:  PT FREQUENCY: 1x/week  PT DURATION: 8 weeks  PLANNED INTERVENTIONS: 97164- PT Re-evaluation, 97110-Therapeutic exercises, 97530- Therapeutic activity, 97112- Neuromuscular re-education, 97535- Self Care, 02859- Manual therapy, [phone]- Aquatic Therapy, [phone]- Electrical stimulation (unattended), [phone]- Electrical stimulation (manual), [id number]- Ultrasound, [id number]- Traction (mechanical), [id number]- Ionotophoresis 4mg /ml Dexamethasone , 79439 (1-2 muscles), [id number] (3+ muscles)- Dry Needling, Patient/Family education, Taping, Joint mobilization, Spinal mobilization, Cryotherapy, and Moist heat.  PLAN FOR NEXT SESSION:  see how cervical injection went;  manual techniques to cervical region and periscapular muscles; thoracic mobility; DN with ES to lumbar and gluteal regions  Glade Pesa, PT 03/02/24 10:02 PM Phone: [phone] Fax: [phone]

## 2024-03-07 ENCOUNTER — Encounter: Payer: Self-pay | Admitting: Physical Therapy

## 2024-03-07 ENCOUNTER — Ambulatory Visit: Attending: Adult Health Nurse Practitioner | Admitting: Physical Therapy

## 2024-03-07 DIAGNOSIS — M5441 Lumbago with sciatica, right side: Secondary | ICD-10-CM | POA: Insufficient documentation

## 2024-03-07 DIAGNOSIS — M542 Cervicalgia: Secondary | ICD-10-CM | POA: Diagnosis present

## 2024-03-07 DIAGNOSIS — M6281 Muscle weakness (generalized): Secondary | ICD-10-CM | POA: Diagnosis present

## 2024-03-07 NOTE — Therapy (Signed)
 OUTPATIENT PHYSICAL THERAPY THORACOLUMBAR PROGRESS NOTE   Patient Name: Krista Austin MRN: [id number] DOB:25-Dec-1945, 78 y.o., female Today's Date: 03/07/2024    END OF SESSION:  PT End of Session - 03/07/24 1019     Visit Number 12    Date for Recertification  04/27/24    Authorization Type Medicare ABN signed for traditional Medicare for October    Progress Note Due on Visit 20    PT Start Time 1017    PT Stop Time 1100    PT Time Calculation (min) 43 min    Activity Tolerance Patient tolerated treatment well          Past Medical History:  Diagnosis Date   A-fib (HCC)    Adenomatous polyp    Arthritis    mostly fingers (10/07/2016)   At high risk for falls    Celiac disease    I do not have the disease; 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 (10/07/2016)   Chronic back pain    worse in my neck; lower back also affected (10/07/2016)   Chronic cholecystitis 10/16/2016   Complication of anesthesia    never fully regained my taste after my cervical fusion; I don't take much RX so I'm very sensitive to any sedation (10/07/2016)   Concussion    X2   DDD (degenerative disc disease), cervical    lumbar   Depression    hx; take Prozac  to keep me stable (10/07/2016)   Dizziness    caused by compression of cervical disc   Family history of adverse reaction to anesthesia    daughters get PONV; one daughter breaks out severely; grandson got suspended in a state of anxiousness after kidney surgery; had to be held for hours   GERD (gastroesophageal reflux disease)    Hyperlipidemia    OSA on CPAP    Pneumonia    once as an adult (10/07/2016)   Thyroid nodule    Type II diabetes mellitus (HCC)    controlled by diet and exercide   Past Surgical History:  Procedure Laterality Date   ANTERIOR CERVICAL DECOMP/DISCECTOMY FUSION  2015   BACK SURGERY     CARPAL TUNNEL RELEASE Bilateral    CATARACT EXTRACTION, BILATERAL Bilateral 09/10/2014 - 09/24/2014   by Evalene Raw    CHOLECYSTECTOMY N/A 10/16/2016   Procedure: LAPAROSCOPIC CHOLECYSTECTOMY;  Surgeon: Belinda Cough, MD;  Location: [facility] OR;  Service: General;  Laterality: N/A;   LAPAROSCOPIC CHOLECYSTECTOMY  10/16/2016   LUMBAR DISC SURGERY  2007   TONSILLECTOMY  05/05/1983   TUBAL LIGATION  02/09/1979   Patient Active Problem List   Diagnosis Date Noted   Closed fracture of 9th & 10th ribs of right side 12/17/2016   Pneumothorax on right 12/17/2016   Lumbar degenerative disc disease 12/17/2016   Essential hypertension 12/17/2016   Dyslipidemia 10/07/2016   Pulmonary nodule 10/07/2016   Abnormal CXR    Chest pain 10/06/2016   S/P lumbar spinal fusion 08/05/2016   Obstructive sleep apnea syndrome, mild 10/27/2013   Controlled type 2 diabetes mellitus without complication, without long-term current use of insulin (HCC) 06/19/2011   GAD (generalized anxiety disorder) 11/01/2009    PCP: Nena Knee PA-C  REFERRING PROVIDER:  Baird Crank NP  REFERRING DIAG: M54.41 lumbago with sciatica, right side; DDD cervical   Rationale for Evaluation and Treatment: Rehabilitation  THERAPY DIAG:  Back pain; weakness ONSET DATE: June 2   SUBJECTIVE:  SUBJECTIVE STATEMENT: Had neck injections yesterday.  I need to work on this sciatica (lumbar) numb in all toes.  Gabapentin  is sedating and I can't take care of Stephan.  Patient brought HEP in for review.  PERTINENT HISTORY:  spinal fusion 2018; chronic neck pain; bil carpal tunnel surgery (wears night splints); DM; HTN; chronic kidney disease; chronic dizziness  PAIN:  Are you having pain? Yes NPRS scale: 5-6/10  neck and mid back, right buttock Pain location: right > left neck and thoracic region Pain orientation: Right and Left  PAIN TYPE: burning Pain  description: constant  Aggravating factors: vacuuming, sweeping, bending over, lifting, changing the sheets; ldriving, sleeping, reading 11/4: back: worse with standing, walking, sitting in a hard chair                    Better Be Active pressure point brace Relieving factors: steroid dose pack, increased Gabapentin , wears back brace with activity, take breaks, lie in recliner  PRECAUTIONS: None   WEIGHT BEARING RESTRICTIONS: No  FALLS:  Has patient fallen in last 6 months? No  LIVING ENVIRONMENT: Lives with: lives with their family Lives in: House/apartment  OCCUPATION: retired  PLOF: Independent  PATIENT GOALS: get a handle on this; don't want it to become a big problem again    OBJECTIVE:  Note: Objective measures were completed at Evaluation unless otherwise noted.  DIAGNOSTIC FINDINGS:  No new scans of lumbar  2020: IMPRESSION: Mild spinal stenosis L2-3 with moderate subarticular stenosis bilaterally, right greater than left   Left laminectomy L3-4. Moderate subarticular and foraminal stenosis bilaterally unchanged   Left foraminal disc protrusion L4-5. Severe subarticular and foraminal stenosis on the left has progressed. Mild spinal stenosis. Moderate subarticular foraminal stenosis on the right   Severe foraminal encroachment on the left at L5-S1 due to spurring. No change from the prior study.  PATIENT SURVEYS:  Modified Oswestry 20% (pt completed first page only)  NDI: 50%  10/2 ODI 32% NDI 48%  COGNITION: Overall cognitive status: Within functional limits for tasks assessed      POSTURE: decreased lumbar lordosis  PALPATION: Tender points in bil gluteals and bil piriformis CERVICAL ROM:   Active ROM A/PROM (deg) eval 10/2 10/21  Flexion 25 25 25   Extension 42 25 25  Right lateral flexion 25 pain on right 18 18  Left lateral flexion 24 pain on right 20 20  Right rotation 37 pain on right 40 40  Left rotation 25 pain on right 38 pain 38    (Blank rows = not tested)   THORACIC ROM: Decreased rotation and extension by 50%  STRENGTH:  deep cervical flexor and extensor strength 4-/5 LUMBAR ROM:   AROM eval 9/3 10/2  Flexion 50% limited 25% limited 20% limited  Extension 75% limited 50% limited 50% limited  Right lateral flexion 25% limited    Left lateral flexion 25% limited    Right rotation     Left rotation      (Blank rows = not tested)  TRUNK STRENGTH:  Decreased activation of transverse abdominus muscles; abdominals 4-/5; decreased activation of lumbar multifidi; trunk extensors 4-/5   9/3:  trunk musculature 4/5 10/2:  trunk musculature 4/5     LOWER EXTREMITY MMT:  bil hip abduction 4-/5 with pelvic drop noted with single leg standing 10 sec 9/3:  bil hip abduction and extension 4/5  FUNCTIONAL TESTS:  Able to rise from standard chair without UE assist Able to do partial squat to  pick up small item from the floor  GAIT: decreased stride length (dec hip flexor lengths)    TREATMENT DATE: 03/07/24   Pt brought prior HEPs for discussion on appropriate ones to focus on Seated sciatic neural floss 10x right/left  (Added to HEP- see below) Seated HS stretch 30 sec right/left 2-3x (Added to HEP- see below) Supine single knee to chest 20 sec 3x right/left (Added to HEP- see below) Supine double knee to chest 20 sec 3x right/left (Added to HEP- see below) Supine lumbar rotation 10x right/left Manual therapy: sidelying pelvic distraction grade 3 5x, sidelying neutral gapping, gluteal and lumbar paraspinal soft tissue mobilization Addaday instrument assisted soft tissue mobilization used on right lateral hip to promote improved circulation and pain reduction; pt positioned in left S/L with PT monitoring response to treatment  TREATMENT DATE: 03/02/24   Discussion of MRI results and plan for today's session Trigger Point Dry Needling Subsequent Treatment: Instructions provided previously at initial dry needling  treatment.  Patient Verbal Consent Given: Yes Education Handout Provided: Yes Muscles Treated: right lumbar multifidi, right gluteals Electrical Stimulation Performed: Yes, Parameters: 2.0 ma 15 min  Treatment Response/Outcome: improved soft tissue mobility Sidelying scapular mobilization medial and lateral, superior/inferior and distraction grade 3 10x each right Supine GH distraction right grade 1/2 10x Prone thoracic PA mobs grade 2 Periscapular soft tissue mobilization bil  TREATMENT DATE: 02/22/24   Discussion of cervical radiculopathy symptoms and which nerves may be involved Supine cervical manual distraction 10x 10 sec holds Supine suboccipital distraction 3 min Sidelying scapular mobilization medial and lateral, superior/inferior and distraction grade 3 10x each right/left Supine upper trap stretches passively 3x right/left Prone thoracic PA mobs grade 2 Periscapular soft tissue mobilization bil  TREATMENT DATE: 02/03/24   ODI NDI Cervical, lumbar ROM Supine cervical manual distraction 10x 10 sec holds Supine suboccipital distraction 3 min Sidelying scapular mobilization medial and lateral, superior/inferior and distraction grade 3 10x each right/left Supine upper trap stretches passively 3x right/left Prone thoracic PA mobs grade 2 Periscapular soft tissue mobilization bil  TREATMENT DATE: 01/27/24   Discussion of current status and areas of focus Discussion of home strategies for pain management Supine cervical manual distraction 10x 10 sec holds Supine suboccipital distraction 3 min Sidelying scapular mobilization medial and lateral, superior/inferior and distraction grade 3 10x each right/left Supine upper trap stretches passively 3x right/left Supine glenohumeral distraction and Grade 1/2 oscillations inferior and posterior Prone thoracic PA mobs grade 2 Periscapular soft tissue mobilization bil  TREATMENT DATE: 01/20/24   Discussion of current status and areas  of focus Home traction use: daily or even 2x/day ok; since it's static start with 5 minutes 5-8 pounds to test tolerance Home use of heat/massager chair regularly Supine cervical manual distraction 10x 10 sec holds Supine suboccipital distraction 5 min Supine upper trap stretches passively 3x right/left Supine glenohumeral distraction and Grade 1/2 oscillations inferior and posterior Prone thoracic PA mobs grade 2 Periscapular soft tissue mobilization bil  TREATMENT DATE: 01/05/24   Discussion of current status and areas of focus Seated thoracic extension with towel roll behind back and also with ball at mid back as fulcrum; hands behind neck 2x8 Seated ball at mid tspine with lateral shifting 10x Manual therapy: soft tissue mobilization to cervical, thoracic and upper lumbar musculature; PA grade 1/2 oscillations lower t-spine 30 sec to each level Trigger Point Dry Needling Subsequent Treatment: Pt instructed on Dry Needling rational, procedures, and possible side effects. Pt instructed to expect mild  to moderate muscle soreness later in the day and/or into the next day.  Pt instructed in methods to reduce muscle soreness. Patient verbalized understanding of these instructions and education.  Patient Verbal Consent Given: Yes Education Handout Provided: Previously Provided pt has had DN in the past at this faciltiy Muscles Treated:  bil cervical multifidi; bil upper traps; bil levator scap, bil rhomboids, thoracic multifidi with extra marination time Electrical Stimulation Performed: no Treatment Response/Outcome: improved soft tissue mobility; twitch responses on right which is a good prognostic indicator  PATIENT EDUCATION:  Education details: Educated patient on anatomy and physiology of current symptoms, prognosis, plan of care as well as initial self care strategies to promote recovery Person educated: Patient Education method: Explanation Education comprehension: verbalized  understanding  HOME EXERCISE PROGRAM: Access Code: [email or web address] URL: [email or web address] Date: 03/07/2024 Prepared by: Glade Pesa  Exercises - Seated Slump Nerve Glide  - 1 x daily - 7 x weekly - 1 sets - 10 reps - Seated Hamstring Stretch  - 1 x daily - 7 x weekly - 1 sets - 3 reps - 20-30 hold - Supine Single Knee to Chest Stretch  - 1 x daily - 7 x weekly - 1 sets - 3 reps - 20-30 hold - Supine Double Knee to Chest  - 1 x daily - 7 x weekly - 1 sets - 3 reps - 20-30 hold - Supine Lower Trunk Rotation  - 1 x daily - 7 x weekly - 1 sets - 10 reps Thoracic extension seated with towel or ball ASSESSMENT:  CLINICAL IMPRESSION: Able to perform seated and supine low level lumbar mobility ex's without symptom exacerbation.  Good response to manual therapy particularly with the Addaday to gluteal soft tissue.  Therapist monitoring response to all interventions and modifying treatment accordingly.    OBJECTIVE IMPAIRMENTS: decreased activity tolerance, difficulty walking, decreased ROM, decreased strength, increased fascial restrictions, impaired perceived functional ability, and pain.   ACTIVITY LIMITATIONS: lifting, bending, standing, squatting, and caring for others  PARTICIPATION LIMITATIONS: meal prep, cleaning, laundry, and community activity  PERSONAL FACTORS: 1-2 comorbidities: prior spinal pain history, diabetes are also affecting patient's functional outcome.   REHAB POTENTIAL: Good  CLINICAL DECISION MAKING: Stable/uncomplicated  EVALUATION COMPLEXITY: Low   GOALS: Goals reviewed with patient? Yes  SHORT TERM GOALS: Target date: 11/19/2023   The patient will demonstrate knowledge of basic self care strategies and exercises to promote healing and recovery of function  Baseline: Goal status: met 7/31  2.  Pt will report at least 30% improvement in symptoms with light lifting, pushing and pulling tasks, sweeping and vacuuming  Baseline:  Goal status: met  9/3  3.  The patient will have improved hip strength to at least 4/5 needed for standing, walking longer distances and descending stairs at home and in the community  Baseline:  Goal status: met 9/3  4.  The patient will have improved trunk flexor and extensor muscle strength to at least 4/5 needed for lifting light to medium weight objects such as grocery bags  Baseline:  Goal status:met 9/3   LONG TERM GOALS: Target date:04/27/2024      The patient will be independent in a safe self progression of a home exercise program to promote further recovery of function  Baseline:  Goal status: ongoing  2.  Pt will report at least 60% improvement in symptoms with light to medium lifting, changing the sheets, sweeping and vacumning  Baseline:  Goal status: ongoing  3.  The  patient will have improved hip strength to at least 4+/5 needed for standing, walking longer distances and descending stairs at home and in the community  Baseline:  Goal status: ongoing  4.  The patient will have improved trunk flexor and extensor muscle strength to at least 4+/5 needed for lifting medium weight objects such as laundry and luggage  Baseline:  Goal status: ongoing  5.  Modified Oswestry functional outcome measure score improved to    16 % indicating improved function with ADLS with less pain.   Baseline:  Goal status: ongoing  6. Neck Disability Index improved to 38% indicating improved function with less pain ongoing 7. Improved cervical rotation to 45 degrees needed for driving ongoing  8. Sleep improved by 25%, able to sleep in the bed new    PLAN:  PT FREQUENCY: 1x/week  PT DURATION: 8 weeks  PLANNED INTERVENTIONS: 97164- PT Re-evaluation, 97110-Therapeutic exercises, 97530- Therapeutic activity, 97112- Neuromuscular re-education, 97535- Self Care, 02859- Manual therapy, [phone]- Aquatic Therapy, H9716- Electrical stimulation (unattended), [phone]- Electrical stimulation (manual),  [id number]- Ultrasound, [id number]- Traction (mechanical), [id number]- Ionotophoresis 4mg /ml Dexamethasone , 79439 (1-2 muscles), [id number] (3+ muscles)- Dry Needling, Patient/Family education, Taping, Joint mobilization, Spinal mobilization, Cryotherapy, and Moist heat.  PLAN FOR NEXT SESSION: review and progress HEP;  manual techniques to cervical region and periscapular muscles; thoracic mobility; DN with ES to lumbar and gluteal regions Glade Pesa, PT 03/07/24 10:37 PM Phone: [phone] Fax: [phone]

## 2024-03-16 ENCOUNTER — Encounter: Payer: Self-pay | Admitting: Physical Therapy

## 2024-03-16 ENCOUNTER — Ambulatory Visit: Admitting: Physical Therapy

## 2024-03-16 DIAGNOSIS — M6281 Muscle weakness (generalized): Secondary | ICD-10-CM

## 2024-03-16 DIAGNOSIS — M5441 Lumbago with sciatica, right side: Secondary | ICD-10-CM

## 2024-03-16 DIAGNOSIS — M542 Cervicalgia: Secondary | ICD-10-CM

## 2024-03-16 NOTE — Therapy (Signed)
 OUTPATIENT PHYSICAL THERAPY THORACOLUMBAR PROGRESS NOTE   Patient Name: Krista Austin MRN: [id number] DOB:[birth year], 78 y.o., female Today's Date: 03/16/2024    END OF SESSION:  PT End of Session - 03/16/24 1010     Visit Number 13    Date for Recertification  04/27/24    Authorization Type Medicare ABN signed for traditional Medicare for October    Progress Note Due on Visit 20    PT Start Time 1013    PT Stop Time 1056    PT Time Calculation (min) 43 min    Activity Tolerance Patient tolerated treatment well          Past Medical History:  Diagnosis Date   A-fib (HCC)    Adenomatous polyp    Arthritis    mostly fingers (10/07/2016)   At high risk for falls    Celiac disease    I do not have the disease; 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 (10/07/2016)   Chronic back pain    worse in my neck; lower back also affected (10/07/2016)   Chronic cholecystitis 10/16/2016   Complication of anesthesia    never fully regained my taste after my cervical fusion; I don't take much RX so I'm very sensitive to any sedation (10/07/2016)   Concussion    X2   DDD (degenerative disc disease), cervical    lumbar   Depression    hx; take Prozac  to keep me stable (10/07/2016)   Dizziness    caused by compression of cervical disc   Family history of adverse reaction to anesthesia    daughters get PONV; one daughter breaks out severely; grandson got suspended in a state of anxiousness after kidney surgery; had to be held for hours   GERD (gastroesophageal reflux disease)    Hyperlipidemia    OSA on CPAP    Pneumonia    once as an adult (10/07/2016)   Thyroid nodule    Type II diabetes mellitus (HCC)    controlled by diet and exercide   Past Surgical History:  Procedure Laterality Date   ANTERIOR CERVICAL DECOMP/DISCECTOMY FUSION  2015   BACK SURGERY     CARPAL TUNNEL RELEASE Bilateral    CATARACT EXTRACTION, BILATERAL Bilateral 09/10/2014 - 09/24/2014   by Evalene Raw    CHOLECYSTECTOMY N/A 10/16/2016   Procedure: LAPAROSCOPIC CHOLECYSTECTOMY;  Surgeon: Belinda Cough, MD;  Location: [facility] OR;  Service: General;  Laterality: N/A;   LAPAROSCOPIC CHOLECYSTECTOMY  10/16/2016   LUMBAR DISC SURGERY  2007   TONSILLECTOMY  05/05/1983   TUBAL LIGATION  02/09/1979   Patient Active Problem List   Diagnosis Date Noted   Closed fracture of 9th & 10th ribs of right side 12/17/2016   Pneumothorax on right 12/17/2016   Lumbar degenerative disc disease 12/17/2016   Essential hypertension 12/17/2016   Dyslipidemia 10/07/2016   Pulmonary nodule 10/07/2016   Abnormal CXR    Chest pain 10/06/2016   S/P lumbar spinal fusion 08/05/2016   Obstructive sleep apnea syndrome, mild 10/27/2013   Controlled type 2 diabetes mellitus without complication, without long-term current use of insulin (HCC) 06/19/2011   GAD (generalized anxiety disorder) 11/01/2009    PCP: Nena Knee PA-C  REFERRING PROVIDER:  Baird Crank NP  REFERRING DIAG: M54.41 lumbago with sciatica, right side; DDD cervical   Rationale for Evaluation and Treatment: Rehabilitation  THERAPY DIAG:  Back pain; weakness ONSET DATE: June 2   SUBJECTIVE:  SUBJECTIVE STATEMENT: Injections seemed to only help for 2 days.  Krista Austin had to help me dress on Sunday.  Sent a note to PCP on Monday.  Ex's got my hip hurting in the groin area so I stopped them.  Posture harness helps the most.  I'd like you to hit that gluteal area today.  Krista Austin used the massage gun. I'd like for you to work on my shoulder blade area and upper back.     PERTINENT HISTORY:  NCV in 2023 + bil CTS spinal fusion 2018; chronic neck pain; bil carpal tunnel surgery (wears night splints); DM; HTN; chronic kidney disease; chronic dizziness  PAIN:  Are you  having pain? Yes NPRS scale: 5-6/10  neck and mid back, right buttock Pain location: right > left neck and thoracic region Pain orientation: Right and Left  PAIN TYPE: burning Pain description: constant  Aggravating factors: vacuuming, sweeping, bending over, lifting, changing the sheets; ldriving, sleeping, reading 11/4: back: worse with standing, walking, sitting in a hard chair                    Better Be Active pressure point brace Relieving factors: steroid dose pack, increased Gabapentin , wears back brace with activity, take breaks, lie in recliner  PRECAUTIONS: None   WEIGHT BEARING RESTRICTIONS: No  FALLS:  Has patient fallen in last 6 months? No  LIVING ENVIRONMENT: Lives with: lives with their family Lives in: House/apartment  OCCUPATION: retired  PLOF: Independent  PATIENT GOALS: get a handle on this; don't want it to become a big problem again    OBJECTIVE:  Note: Objective measures were completed at Evaluation unless otherwise noted.  DIAGNOSTIC FINDINGS:  No new scans of lumbar  2020: IMPRESSION: Mild spinal stenosis L2-3 with moderate subarticular stenosis bilaterally, right greater than left   Left laminectomy L3-4. Moderate subarticular and foraminal stenosis bilaterally unchanged   Left foraminal disc protrusion L4-5. Severe subarticular and foraminal stenosis on the left has progressed. Mild spinal stenosis. Moderate subarticular foraminal stenosis on the right   Severe foraminal encroachment on the left at L5-S1 due to spurring. No change from the prior study.  PATIENT SURVEYS:  Modified Oswestry 20% (pt completed first page only)  NDI: 50%  10/2 ODI 32% NDI 48%  COGNITION: Overall cognitive status: Within functional limits for tasks assessed      POSTURE: decreased lumbar lordosis  PALPATION: Tender points in bil gluteals and bil piriformis CERVICAL ROM:   Active ROM A/PROM (deg) eval 10/2 10/21  Flexion 25 25 25   Extension  42 25 25  Right lateral flexion 25 pain on right 18 18  Left lateral flexion 24 pain on right 20 20  Right rotation 37 pain on right 40 40  Left rotation 25 pain on right 38 pain 38   (Blank rows = not tested)   THORACIC ROM: Decreased rotation and extension by 50%  STRENGTH:  deep cervical flexor and extensor strength 4-/5 LUMBAR ROM:   AROM eval 9/3 10/2  Flexion 50% limited 25% limited 20% limited  Extension 75% limited 50% limited 50% limited  Right lateral flexion 25% limited    Left lateral flexion 25% limited    Right rotation     Left rotation      (Blank rows = not tested)  TRUNK STRENGTH:  Decreased activation of transverse abdominus muscles; abdominals 4-/5; decreased activation of lumbar multifidi; trunk extensors 4-/5   9/3:  trunk musculature 4/5 10/2:  trunk musculature 4/5  LOWER EXTREMITY MMT:  bil hip abduction 4-/5 with pelvic drop noted with single leg standing 10 sec 9/3:  bil hip abduction and extension 4/5  FUNCTIONAL TESTS:  Able to rise from standard chair without UE assist Able to do partial squat to pick up small item from the floor  GAIT: decreased stride length (dec hip flexor lengths)    TREATMENT DATE: 03/16/24   Review of HEP: discussed eliminating piriformis stretch and lumbar rotation as this seemed to aggravate symptoms  Discussed symptoms and possibility of both peripheral nerve as well as nerve root compression could be occurring Discussed home interventions including massage gun and possibly restarting cervical traction Manual therapy: sidelying right gluteal soft tissue mobilization; pelvic distraction grade 3 5x, sidelying neutral gapping, gluteal and lumbar paraspinal soft tissue mobilization Manual therapy: right and left scapular mobilization glides and distraction for scapular dissociation Prone manual therapy: T6-T10 PA mobs and rotation mobs grade 3    TREATMENT DATE: 03/07/24   Pt brought prior HEPs for discussion on  appropriate ones to focus on Seated sciatic neural floss 10x right/left  (Added to HEP- see below) Seated HS stretch 30 sec right/left 2-3x (Added to HEP- see below) Supine single knee to chest 20 sec 3x right/left (Added to HEP- see below) Supine double knee to chest 20 sec 3x right/left (Added to HEP- see below) Supine lumbar rotation 10x right/left Manual therapy: sidelying pelvic distraction grade 3 5x, sidelying neutral gapping, gluteal and lumbar paraspinal soft tissue mobilization Addaday instrument assisted soft tissue mobilization used on right lateral hip to promote improved circulation and pain reduction; pt positioned in left S/L with PT monitoring response to treatment  TREATMENT DATE: 03/02/24   Discussion of MRI results and plan for today's session Trigger Point Dry Needling Subsequent Treatment: Instructions provided previously at initial dry needling treatment.  Patient Verbal Consent Given: Yes Education Handout Provided: Yes Muscles Treated: right lumbar multifidi, right gluteals Electrical Stimulation Performed: Yes, Parameters: 2.0 ma 15 min  Treatment Response/Outcome: improved soft tissue mobility Sidelying scapular mobilization medial and lateral, superior/inferior and distraction grade 3 10x each right Supine GH distraction right grade 1/2 10x Prone thoracic PA mobs grade 2 Periscapular soft tissue mobilization bil  TREATMENT DATE: 02/22/24   Discussion of cervical radiculopathy symptoms and which nerves may be involved Supine cervical manual distraction 10x 10 sec holds Supine suboccipital distraction 3 min Sidelying scapular mobilization medial and lateral, superior/inferior and distraction grade 3 10x each right/left Supine upper trap stretches passively 3x right/left Prone thoracic PA mobs grade 2 Periscapular soft tissue mobilization bil  TREATMENT DATE: 02/03/24   ODI NDI Cervical, lumbar ROM Supine cervical manual distraction 10x 10 sec holds Supine  suboccipital distraction 3 min Sidelying scapular mobilization medial and lateral, superior/inferior and distraction grade 3 10x each right/left Supine upper trap stretches passively 3x right/left Prone thoracic PA mobs grade 2 Periscapular soft tissue mobilization bil  TREATMENT DATE: 01/27/24   Discussion of current status and areas of focus Discussion of home strategies for pain management Supine cervical manual distraction 10x 10 sec holds Supine suboccipital distraction 3 min Sidelying scapular mobilization medial and lateral, superior/inferior and distraction grade 3 10x each right/left Supine upper trap stretches passively 3x right/left Supine glenohumeral distraction and Grade 1/2 oscillations inferior and posterior Prone thoracic PA mobs grade 2 Periscapular soft tissue mobilization bil  TREATMENT DATE: 01/20/24   Discussion of current status and areas of focus Home traction use: daily or even 2x/day ok; since it's static  start with 5 minutes 5-8 pounds to test tolerance Home use of heat/massager chair regularly Supine cervical manual distraction 10x 10 sec holds Supine suboccipital distraction 5 min Supine upper trap stretches passively 3x right/left Supine glenohumeral distraction and Grade 1/2 oscillations inferior and posterior Prone thoracic PA mobs grade 2 Periscapular soft tissue mobilization bil PATIENT EDUCATION:  Education details: Educated patient on anatomy and physiology of current symptoms, prognosis, plan of care as well as initial self care strategies to promote recovery Person educated: Patient Education method: Explanation Education comprehension: verbalized understanding  HOME EXERCISE PROGRAM: Access Code: [email or web address] URL: [email or web address] Date: 03/07/2024 Prepared by: Glade Pesa  Exercises - Seated Slump Nerve Glide  - 1 x daily - 7 x weekly - 1 sets - 10 reps - Seated Hamstring Stretch  - 1 x daily - 7 x weekly - 1 sets - 3 reps  - 20-30 hold - Supine Single Knee to Chest Stretch  - 1 x daily - 7 x weekly - 1 sets - 3 reps - 20-30 hold - Supine Double Knee to Chest  - 1 x daily - 7 x weekly - 1 sets - 3 reps - 20-30 hold - Supine Lower Trunk Rotation  - 1 x daily - 7 x weekly - 1 sets - 10 reps Thoracic extension seated with towel or ball ASSESSMENT:  CLINICAL IMPRESSION: Laiklyn continues to be in significant neck and UE pain which is variable but severely impacts her function (needed help with dressing over the weekend).  She plans to follow up with the doctor regarding symptoms as soon as she is able to get in.  She responds well to lumbo/pelvic/hip manual techniques as well as scapular mobilization however her neck pain increases in the sidelying position today.  She also reports pain reduction with thoracic joint mobilization both PA and rotational directions.  Therapist monitoring response to all interventions and modifying treatment accordingly.      OBJECTIVE IMPAIRMENTS: decreased activity tolerance, difficulty walking, decreased ROM, decreased strength, increased fascial restrictions, impaired perceived functional ability, and pain.   ACTIVITY LIMITATIONS: lifting, bending, standing, squatting, and caring for others  PARTICIPATION LIMITATIONS: meal prep, cleaning, laundry, and community activity  PERSONAL FACTORS: 1-2 comorbidities: prior spinal pain history, diabetes are also affecting patient's functional outcome.   REHAB POTENTIAL: Good  CLINICAL DECISION MAKING: Stable/uncomplicated  EVALUATION COMPLEXITY: Low   GOALS: Goals reviewed with patient? Yes  SHORT TERM GOALS: Target date: 11/19/2023   The patient will demonstrate knowledge of basic self care strategies and exercises to promote healing and recovery of function  Baseline: Goal status: met 7/31  2.  Pt will report at least 30% improvement in symptoms with light lifting, pushing and pulling tasks, sweeping and vacuuming  Baseline:   Goal status: met 9/3  3.  The patient will have improved hip strength to at least 4/5 needed for standing, walking longer distances and descending stairs at home and in the community  Baseline:  Goal status: met 9/3  4.  The patient will have improved trunk flexor and extensor muscle strength to at least 4/5 needed for lifting light to medium weight objects such as grocery bags  Baseline:  Goal status:met 9/3   LONG TERM GOALS: Target date:04/27/2024      The patient will be independent in a safe self progression of a home exercise program to promote further recovery of function  Baseline:  Goal status: ongoing  2.  Pt will report at least 60% improvement in symptoms  with light to medium lifting, changing the sheets, sweeping and vacumning  Baseline:  Goal status: ongoing  3.  The patient will have improved hip strength to at least 4+/5 needed for standing, walking longer distances and descending stairs at home and in the community  Baseline:  Goal status: ongoing  4.  The patient will have improved trunk flexor and extensor muscle strength to at least 4+/5 needed for lifting medium weight objects such as laundry and luggage  Baseline:  Goal status: ongoing  5.  Modified Oswestry functional outcome measure score improved to    16 % indicating improved function with ADLS with less pain.   Baseline:  Goal status: ongoing  6. Neck Disability Index improved to 38% indicating improved function with less pain ongoing 7. Improved cervical rotation to 45 degrees needed for driving ongoing  8. Sleep improved by 25%, able to sleep in the bed new    PLAN:  PT FREQUENCY: 1x/week  PT DURATION: 8 weeks  PLANNED INTERVENTIONS: 97164- PT Re-evaluation, 97110-Therapeutic exercises, 97530- Therapeutic activity, 97112- Neuromuscular re-education, 97535- Self Care, 02859- Manual therapy, [phone]- Aquatic Therapy, [id number]- Electrical stimulation (unattended), [phone]- Electrical  stimulation (manual), [id number]- Ultrasound, [id number]- Traction (mechanical), [id number]- Ionotophoresis 4mg /ml Dexamethasone , 79439 (1-2 muscles), 20561 (3+ muscles)- Dry Needling, Patient/Family education, Taping, Joint mobilization, Spinal mobilization, Cryotherapy, and Moist heat.  PLAN FOR NEXT SESSION: review and progress HEP;  manual techniques to lumbar, cervical region and periscapular muscles; thoracic mobility; DN with ES to lumbar and gluteal regions  Glade Pesa, PT 03/16/24 2:33 PM Phone: [phone] Fax: [phone]

## 2024-03-23 ENCOUNTER — Ambulatory Visit: Admitting: Physical Therapy

## 2024-04-04 ENCOUNTER — Encounter: Payer: Self-pay | Admitting: Physical Therapy

## 2024-04-04 ENCOUNTER — Ambulatory Visit: Attending: Adult Health Nurse Practitioner | Admitting: Physical Therapy

## 2024-04-04 DIAGNOSIS — M542 Cervicalgia: Secondary | ICD-10-CM | POA: Insufficient documentation

## 2024-04-04 DIAGNOSIS — M5442 Lumbago with sciatica, left side: Secondary | ICD-10-CM | POA: Diagnosis present

## 2024-04-04 DIAGNOSIS — M6281 Muscle weakness (generalized): Secondary | ICD-10-CM | POA: Diagnosis present

## 2024-04-04 DIAGNOSIS — M5441 Lumbago with sciatica, right side: Secondary | ICD-10-CM | POA: Insufficient documentation

## 2024-04-04 NOTE — Therapy (Signed)
 OUTPATIENT PHYSICAL THERAPY THORACOLUMBAR PROGRESS NOTE   Patient Name: Krista Austin MRN: [id number] DOB:1945/06/21, 78 y.o., female Today's Date: 04/04/2024    END OF SESSION:  PT End of Session - 04/04/24 1148     Visit Number 14    Date for Recertification  04/27/24    Authorization Type Medicare ABN signed for traditional Medicare for December    Progress Note Due on Visit 20    PT Start Time 1146    PT Stop Time 1230    PT Time Calculation (min) 44 min    Activity Tolerance Patient tolerated treatment well          Past Medical History:  Diagnosis Date   A-fib (HCC)    Adenomatous polyp    Arthritis    mostly fingers (10/07/2016)   At high risk for falls    Celiac disease    I do not have the disease; 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 (10/07/2016)   Chronic back pain    worse in my neck; lower back also affected (10/07/2016)   Chronic cholecystitis 10/16/2016   Complication of anesthesia    never fully regained my taste after my cervical fusion; I don't take much RX so I'm very sensitive to any sedation (10/07/2016)   Concussion    X2   DDD (degenerative disc disease), cervical    lumbar   Depression    hx; take Prozac  to keep me stable (10/07/2016)   Dizziness    caused by compression of cervical disc   Family history of adverse reaction to anesthesia    daughters get PONV; one daughter breaks out severely; grandson got suspended in a state of anxiousness after kidney surgery; had to be held for hours   GERD (gastroesophageal reflux disease)    Hyperlipidemia    OSA on CPAP    Pneumonia    once as an adult (10/07/2016)   Thyroid nodule    Type II diabetes mellitus (HCC)    controlled by diet and exercide   Past Surgical History:  Procedure Laterality Date   ANTERIOR CERVICAL DECOMP/DISCECTOMY FUSION  2015   BACK SURGERY     CARPAL TUNNEL RELEASE Bilateral    CATARACT EXTRACTION, BILATERAL Bilateral 09/10/2014 - 09/24/2014   by Evalene Raw    CHOLECYSTECTOMY N/A 10/16/2016   Procedure: LAPAROSCOPIC CHOLECYSTECTOMY;  Surgeon: Belinda Cough, MD;  Location: [facility] OR;  Service: General;  Laterality: N/A;   LAPAROSCOPIC CHOLECYSTECTOMY  10/16/2016   LUMBAR DISC SURGERY  2007   TONSILLECTOMY  05/05/1983   TUBAL LIGATION  02/09/1979   Patient Active Problem List   Diagnosis Date Noted   Closed fracture of 9th & 10th ribs of right side 12/17/2016   Pneumothorax on right 12/17/2016   Lumbar degenerative disc disease 12/17/2016   Essential hypertension 12/17/2016   Dyslipidemia 10/07/2016   Pulmonary nodule 10/07/2016   Abnormal CXR    Chest pain 10/06/2016   S/P lumbar spinal fusion 08/05/2016   Obstructive sleep apnea syndrome, mild 10/27/2013   Controlled type 2 diabetes mellitus without complication, without long-term current use of insulin (HCC) 06/19/2011   GAD (generalized anxiety disorder) 11/01/2009    PCP: Nena Knee PA-C  REFERRING PROVIDER:  Baird Crank NP  REFERRING DIAG: M54.41 lumbago with sciatica, right side; DDD cervical   Rationale for Evaluation and Treatment: Rehabilitation  THERAPY DIAG:  Back pain; weakness ONSET DATE: June 2   SUBJECTIVE:  SUBJECTIVE STATEMENT: It's been a bad 2 weeks.  Going for NCV with Dr. Camella.  Still having lumbar sciatica.  Neck and Ues worse over night.  Better once I get upright.  Sleeping in the recliner.  This is wearing on me.  I stopped the leg ex's bc it made my hip worse.  Stopped traction b/c it wasn't hurting.  Tried a neck collar for night time.  Reduction in taste.   Monday with Gram[facility] 12/11 with neurology  PERTINENT HISTORY:  NCV in 2023 + bil CTS spinal fusion 2018; chronic neck pain; bil carpal tunnel surgery (wears night splints); DM; HTN; chronic kidney disease;  chronic dizziness  PAIN:  Are you having pain? Yes NPRS scale: 5-6/10  neck and mid back, right buttock Pain location: right > left neck and thoracic region Pain orientation: Right and Left  PAIN TYPE: burning Pain description: constant  Aggravating factors: vacuuming, sweeping, bending over, lifting, changing the sheets; ldriving, sleeping, reading 11/4: back: worse with standing, walking, sitting in a hard chair                    Better Be Active pressure point brace Relieving factors: steroid dose pack, increased Gabapentin , wears back brace with activity, take breaks, lie in recliner  PRECAUTIONS: None   WEIGHT BEARING RESTRICTIONS: No  FALLS:  Has patient fallen in last 6 months? No  LIVING ENVIRONMENT: Lives with: lives with their family Lives in: House/apartment  OCCUPATION: retired  PLOF: Independent  PATIENT GOALS: get a handle on this; don't want it to become a big problem again    OBJECTIVE:  Note: Objective measures were completed at Evaluation unless otherwise noted.  DIAGNOSTIC FINDINGS:  No new scans of lumbar  2020: IMPRESSION: Mild spinal stenosis L2-3 with moderate subarticular stenosis bilaterally, right greater than left   Left laminectomy L3-4. Moderate subarticular and foraminal stenosis bilaterally unchanged   Left foraminal disc protrusion L4-5. Severe subarticular and foraminal stenosis on the left has progressed. Mild spinal stenosis. Moderate subarticular foraminal stenosis on the right   Severe foraminal encroachment on the left at L5-S1 due to spurring. No change from the prior study.  PATIENT SURVEYS:  Modified Oswestry 20% (pt completed first page only)  NDI: 50%  10/2 ODI 32% NDI 48%  COGNITION: Overall cognitive status: Within functional limits for tasks assessed      POSTURE: decreased lumbar lordosis  PALPATION: Tender points in bil gluteals and bil piriformis CERVICAL ROM:   Active ROM A/PROM (deg) eval 10/2  10/21  Flexion 25 25 25   Extension 42 25 25  Right lateral flexion 25 pain on right 18 18  Left lateral flexion 24 pain on right 20 20  Right rotation 37 pain on right 40 40  Left rotation 25 pain on right 38 pain 38   (Blank rows = not tested)   THORACIC ROM: Decreased rotation and extension by 50%  STRENGTH:  deep cervical flexor and extensor strength 4-/5 LUMBAR ROM:   AROM eval 9/3 10/2  Flexion 50% limited 25% limited 20% limited  Extension 75% limited 50% limited 50% limited  Right lateral flexion 25% limited    Left lateral flexion 25% limited    Right rotation     Left rotation      (Blank rows = not tested)  TRUNK STRENGTH:  Decreased activation of transverse abdominus muscles; abdominals 4-/5; decreased activation of lumbar multifidi; trunk extensors 4-/5   9/3:  trunk musculature 4/5 10/2:  trunk musculature 4/5     LOWER EXTREMITY MMT:  bil hip abduction 4-/5 with pelvic drop noted with single leg standing 10 sec 9/3:  bil hip abduction and extension 4/5  FUNCTIONAL TESTS:  Able to rise from standard chair without UE assist Able to do partial squat to pick up small item from the floor  GAIT: decreased stride length (dec hip flexor lengths)     TREATMENT DATE:  04/04/24 Discussion of upcoming appts including NCV and assessment of proximal nerves as well as distal to determine where neural compromise exists (possibility of double crush syndrome) Discussion of current status to determine best course of intervention today Trigger Point Dry Needling Subsequent Treatment: Instructions provided previously at initial dry needling treatment.  Patient Verbal Consent Given: Yes Education Handout Provided: Yes Muscles Treated: right lumbar multifidi, right gluteals Electrical Stimulation Performed: Yes, Parameters: 2.0 ma 15 min  Treatment Response/Outcome: improved soft tissue mobility  Manual therapy: Prone thoracic PA mobs grade 2 Periscapular soft tissue  mobilization bil particularly rhomboids and levator scap   TREATMENT DATE: 03/16/24   Review of HEP: discussed eliminating piriformis stretch and lumbar rotation as this seemed to aggravate symptoms  Discussed symptoms and possibility of both peripheral nerve as well as nerve root compression could be occurring Discussed home interventions including massage gun and possibly restarting cervical traction Manual therapy: sidelying right gluteal soft tissue mobilization; pelvic distraction grade 3 5x, sidelying neutral gapping, gluteal and lumbar paraspinal soft tissue mobilization Manual therapy: right and left scapular mobilization glides and distraction for scapular dissociation Prone manual therapy: T6-T10 PA mobs and rotation mobs grade 3    TREATMENT DATE: 03/07/24   Pt brought prior HEPs for discussion on appropriate ones to focus on Seated sciatic neural floss 10x right/left  (Added to HEP- see below) Seated HS stretch 30 sec right/left 2-3x (Added to HEP- see below) Supine single knee to chest 20 sec 3x right/left (Added to HEP- see below) Supine double knee to chest 20 sec 3x right/left (Added to HEP- see below) Supine lumbar rotation 10x right/left Manual therapy: sidelying pelvic distraction grade 3 5x, sidelying neutral gapping, gluteal and lumbar paraspinal soft tissue mobilization Addaday instrument assisted soft tissue mobilization used on right lateral hip to promote improved circulation and pain reduction; pt positioned in left S/L with PT monitoring response to treatment  TREATMENT DATE: 03/02/24   Discussion of MRI results and plan for today's session Trigger Point Dry Needling Subsequent Treatment: Instructions provided previously at initial dry needling treatment.  Patient Verbal Consent Given: Yes Education Handout Provided: Yes Muscles Treated: right lumbar multifidi, right gluteals Electrical Stimulation Performed: Yes, Parameters: 2.0 ma 15 min  Treatment  Response/Outcome: improved soft tissue mobility Sidelying scapular mobilization medial and lateral, superior/inferior and distraction grade 3 10x each right Supine GH distraction right grade 1/2 10x Prone thoracic PA mobs grade 2 Periscapular soft tissue mobilization bil  TREATMENT DATE: 02/22/24   Discussion of cervical radiculopathy symptoms and which nerves may be involved Supine cervical manual distraction 10x 10 sec holds Supine suboccipital distraction 3 min Sidelying scapular mobilization medial and lateral, superior/inferior and distraction grade 3 10x each right/left Supine upper trap stretches passively 3x right/left Prone thoracic PA mobs grade 2 Periscapular soft tissue mobilization bil  TREATMENT DATE: 02/03/24   ODI NDI Cervical, lumbar ROM Supine cervical manual distraction 10x 10 sec holds Supine suboccipital distraction 3 min Sidelying scapular mobilization medial and lateral, superior/inferior and distraction grade 3 10x each right/left Supine upper  trap stretches passively 3x right/left Prone thoracic PA mobs grade 2 Periscapular soft tissue mobilization bil  TREATMENT DATE: 01/27/24   Discussion of current status and areas of focus Discussion of home strategies for pain management Supine cervical manual distraction 10x 10 sec holds Supine suboccipital distraction 3 min Sidelying scapular mobilization medial and lateral, superior/inferior and distraction grade 3 10x each right/left Supine upper trap stretches passively 3x right/left Supine glenohumeral distraction and Grade 1/2 oscillations inferior and posterior Prone thoracic PA mobs grade 2 Periscapular soft tissue mobilization bil  TREATMENT DATE: 01/20/24   Discussion of current status and areas of focus Home traction use: daily or even 2x/day ok; since it's static start with 5 minutes 5-8 pounds to test tolerance Home use of heat/massager chair regularly Supine cervical manual distraction 10x 10 sec  holds Supine suboccipital distraction 5 min Supine upper trap stretches passively 3x right/left Supine glenohumeral distraction and Grade 1/2 oscillations inferior and posterior Prone thoracic PA mobs grade 2 Periscapular soft tissue mobilization bil PATIENT EDUCATION:  Education details: Educated patient on anatomy and physiology of current symptoms, prognosis, plan of care as well as initial self care strategies to promote recovery Person educated: Patient Education method: Explanation Education comprehension: verbalized understanding  HOME EXERCISE PROGRAM: Access Code: [email or web address] URL: [email or web address] Date: 03/07/2024 Prepared by: Glade Pesa  Exercises - Seated Slump Nerve Glide  - 1 x daily - 7 x weekly - 1 sets - 10 reps - Seated Hamstring Stretch  - 1 x daily - 7 x weekly - 1 sets - 3 reps - 20-30 hold - Supine Single Knee to Chest Stretch  - 1 x daily - 7 x weekly - 1 sets - 3 reps - 20-30 hold - Supine Double Knee to Chest  - 1 x daily - 7 x weekly - 1 sets - 3 reps - 20-30 hold - Supine Lower Trunk Rotation  - 1 x daily - 7 x weekly - 1 sets - 10 reps Thoracic extension seated with towel or ball ASSESSMENT:  CLINICAL IMPRESSION: Treatment interventions selected to address constant severe neck and bil UE symptoms affecting sleep and basic ADLs.  Manual techniques to periscapular and thoracic region offers temporary relief.  She responds well with DN combined with ES for lumbar/gluteal regions.  Therapist monitoring response to all interventions and modifying treatment accordingly.      OBJECTIVE IMPAIRMENTS: decreased activity tolerance, difficulty walking, decreased ROM, decreased strength, increased fascial restrictions, impaired perceived functional ability, and pain.   ACTIVITY LIMITATIONS: lifting, bending, standing, squatting, and caring for others  PARTICIPATION LIMITATIONS: meal prep, cleaning, laundry, and community activity  PERSONAL  FACTORS: 1-2 comorbidities: prior spinal pain history, diabetes are also affecting patient's functional outcome.   REHAB POTENTIAL: Good  CLINICAL DECISION MAKING: Stable/uncomplicated  EVALUATION COMPLEXITY: Low   GOALS: Goals reviewed with patient? Yes  SHORT TERM GOALS: Target date: 11/19/2023   The patient will demonstrate knowledge of basic self care strategies and exercises to promote healing and recovery of function  Baseline: Goal status: met 7/31  2.  Pt will report at least 30% improvement in symptoms with light lifting, pushing and pulling tasks, sweeping and vacuuming  Baseline:  Goal status: met 9/3  3.  The patient will have improved hip strength to at least 4/5 needed for standing, walking longer distances and descending stairs at home and in the community  Baseline:  Goal status: met 9/3  4.  The patient will have improved trunk flexor and extensor muscle  strength to at least 4/5 needed for lifting light to medium weight objects such as grocery bags  Baseline:  Goal status:met 9/3   LONG TERM GOALS: Target date:04/27/2024      The patient will be independent in a safe self progression of a home exercise program to promote further recovery of function  Baseline:  Goal status: ongoing  2.  Pt will report at least 60% improvement in symptoms with light to medium lifting, changing the sheets, sweeping and vacumning  Baseline:  Goal status: ongoing  3.  The patient will have improved hip strength to at least 4+/5 needed for standing, walking longer distances and descending stairs at home and in the community  Baseline:  Goal status: ongoing  4.  The patient will have improved trunk flexor and extensor muscle strength to at least 4+/5 needed for lifting medium weight objects such as laundry and luggage  Baseline:  Goal status: ongoing  5.  Modified Oswestry functional outcome measure score improved to    16 % indicating improved function with ADLS  with less pain.   Baseline:  Goal status: ongoing  6. Neck Disability Index improved to 38% indicating improved function with less pain ongoing 7. Improved cervical rotation to 45 degrees needed for driving ongoing  8. Sleep improved by 25%, able to sleep in the bed new    PLAN:  PT FREQUENCY: 1x/week  PT DURATION: 8 weeks  PLANNED INTERVENTIONS: 97164- PT Re-evaluation, 97110-Therapeutic exercises, 97530- Therapeutic activity, 97112- Neuromuscular re-education, 97535- Self Care, [id number]- Manual therapy, [phone]- Aquatic Therapy, [id number]- Electrical stimulation (unattended), [phone]- Electrical stimulation (manual), [id number]- Ultrasound, [id number]- Traction (mechanical), [id number]- Ionotophoresis 4mg /ml Dexamethasone , 79439 (1-2 muscles), [id number] (3+ muscles)- Dry Needling, Patient/Family education, Taping, Joint mobilization, Spinal mobilization, Cryotherapy, and Moist heat.  PLAN FOR NEXT SESSION: see how NCV went; appt with neuro in 1 week; review and progress HEP;  manual techniques to lumbar, cervical region and periscapular muscles; thoracic mobility; DN with ES to lumbar and gluteal regions  Glade Pesa, PT 04/04/24 7:20 PM Phone: [phone] Fax: [phone]

## 2024-04-13 ENCOUNTER — Ambulatory Visit: Admitting: Physical Therapy

## 2024-04-18 ENCOUNTER — Encounter: Payer: Self-pay | Admitting: Physical Therapy

## 2024-04-18 ENCOUNTER — Ambulatory Visit: Admitting: Physical Therapy

## 2024-04-18 DIAGNOSIS — M6281 Muscle weakness (generalized): Secondary | ICD-10-CM

## 2024-04-18 DIAGNOSIS — M5441 Lumbago with sciatica, right side: Secondary | ICD-10-CM

## 2024-04-18 DIAGNOSIS — M542 Cervicalgia: Secondary | ICD-10-CM | POA: Diagnosis not present

## 2024-04-18 DIAGNOSIS — M5442 Lumbago with sciatica, left side: Secondary | ICD-10-CM

## 2024-04-18 NOTE — Therapy (Addendum)
 " OUTPATIENT PHYSICAL THERAPY THORACOLUMBAR PROGRESS NOTE/DISCHARGE SUMMARY   Patient Name: Krista Austin MRN: [id number] DOB:1946/04/18, 78 y.o., female Today's Date: 04/18/2024    END OF SESSION:  PT End of Session - 04/18/24 1046     Visit Number 15    Date for Recertification  04/27/24    Authorization Type Medicare ABN signed for traditional Medicare for December    Progress Note Due on Visit 20    PT Start Time 1100    PT Stop Time 1144    PT Time Calculation (min) 44 min    Activity Tolerance Patient tolerated treatment well          Past Medical History:  Diagnosis Date   A-fib (HCC)    Adenomatous polyp    Arthritis    mostly fingers (10/07/2016)   At high risk for falls    Celiac disease    I do not have the disease; 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 (10/07/2016)   Chronic back pain    worse in my neck; lower back also affected (10/07/2016)   Chronic cholecystitis 10/16/2016   Complication of anesthesia    never fully regained my taste after my cervical fusion; I don't take much RX so I'm very sensitive to any sedation (10/07/2016)   Concussion    X2   DDD (degenerative disc disease), cervical    lumbar   Depression    hx; take Prozac  to keep me stable (10/07/2016)   Dizziness    caused by compression of cervical disc   Family history of adverse reaction to anesthesia    daughters get PONV; one daughter breaks out severely; grandson got suspended in a state of anxiousness after kidney surgery; had to be held for hours   GERD (gastroesophageal reflux disease)    Hyperlipidemia    OSA on CPAP    Pneumonia    once as an adult (10/07/2016)   Thyroid nodule    Type II diabetes mellitus (HCC)    controlled by diet and exercide   Past Surgical History:  Procedure Laterality Date   ANTERIOR CERVICAL DECOMP/DISCECTOMY FUSION  2015   BACK SURGERY     CARPAL TUNNEL RELEASE Bilateral    CATARACT EXTRACTION, BILATERAL Bilateral 09/10/2014 - 09/24/2014   by  Evalene Raw   CHOLECYSTECTOMY N/A 10/16/2016   Procedure: LAPAROSCOPIC CHOLECYSTECTOMY;  Surgeon: Belinda Cough, MD;  Location: [facility] OR;  Service: General;  Laterality: N/A;   LAPAROSCOPIC CHOLECYSTECTOMY  10/16/2016   LUMBAR DISC SURGERY  2007   TONSILLECTOMY  05/05/1983   TUBAL LIGATION  02/09/1979   Patient Active Problem List   Diagnosis Date Noted   Closed fracture of 9th & 10th ribs of right side 12/17/2016   Pneumothorax on right 12/17/2016   Lumbar degenerative disc disease 12/17/2016   Essential hypertension 12/17/2016   Dyslipidemia 10/07/2016   Pulmonary nodule 10/07/2016   Abnormal CXR    Chest pain 10/06/2016   S/P lumbar spinal fusion 08/05/2016   Obstructive sleep apnea syndrome, mild 10/27/2013   Controlled type 2 diabetes mellitus without complication, without long-term current use of insulin (HCC) 06/19/2011   GAD (generalized anxiety disorder) 11/01/2009    PCP: Nena Knee PA-C  REFERRING PROVIDER:  Baird Crank NP  REFERRING DIAG: M54.41 lumbago with sciatica, right side; DDD cervical   Rationale for Evaluation and Treatment: Rehabilitation  THERAPY DIAG:  Back pain; weakness ONSET DATE: June 2   SUBJECTIVE:  SUBJECTIVE STATEMENT: Had NCV on Sunday and it confirmed carpal tunnel syndrome.  Saw Dr. Camella and injections in carpal tunnel which didn't really change anything. Sees Gramig again 1/8. Going to do an MRI for lumbar.  I think we can keep that sciatica at bay.  Had a good week but then a few bad days.      Last visit: It's been a bad 2 weeks.  Going for NCV with Dr. Camella.  Still having lumbar sciatica.  Neck and Ues worse over night.  Better once I get upright.  Sleeping in the recliner.  This is wearing on me.  I stopped the leg ex's bc it made my hip  worse.  Stopped traction b/c it wasn't hurting.  Tried a neck collar for night time.  Reduction in taste.    PERTINENT HISTORY:  NCV in 2023 + bil CTS spinal fusion 2018; chronic neck pain; bil carpal tunnel surgery (wears night splints); DM; HTN; chronic kidney disease; chronic dizziness  PAIN:  Are you having pain? Yes NPRS scale: 5-6/10  neck and mid back, right buttock Pain location: right > left neck and thoracic region Pain orientation: Right and Left  PAIN TYPE: burning Pain description: constant  Aggravating factors: vacuuming, sweeping, bending over, lifting, changing the sheets; ldriving, sleeping, reading 11/4: back: worse with standing, walking, sitting in a hard chair                    Better Be Active pressure point brace Relieving factors: steroid dose pack, increased Gabapentin , wears back brace with activity, take breaks, lie in recliner  PRECAUTIONS: None   WEIGHT BEARING RESTRICTIONS: No  FALLS:  Has patient fallen in last 6 months? No  LIVING ENVIRONMENT: Lives with: lives with their family Lives in: House/apartment  OCCUPATION: retired  PLOF: Independent  PATIENT GOALS: get a handle on this; don't want it to become a big problem again    OBJECTIVE:  Note: Objective measures were completed at Evaluation unless otherwise noted.  DIAGNOSTIC FINDINGS:  No new scans of lumbar  2020: IMPRESSION: Mild spinal stenosis L2-3 with moderate subarticular stenosis bilaterally, right greater than left   Left laminectomy L3-4. Moderate subarticular and foraminal stenosis bilaterally unchanged   Left foraminal disc protrusion L4-5. Severe subarticular and foraminal stenosis on the left has progressed. Mild spinal stenosis. Moderate subarticular foraminal stenosis on the right   Severe foraminal encroachment on the left at L5-S1 due to spurring. No change from the prior study.  PATIENT SURVEYS:  Modified Oswestry 20% (pt completed first page only)  NDI:  50%  10/2 ODI 32% NDI 48%  COGNITION: Overall cognitive status: Within functional limits for tasks assessed      POSTURE: decreased lumbar lordosis  PALPATION: Tender points in bil gluteals and bil piriformis CERVICAL ROM:   Active ROM A/PROM (deg) eval 10/2 10/21  Flexion 25 25 25   Extension 42 25 25  Right lateral flexion 25 pain on right 18 18  Left lateral flexion 24 pain on right 20 20  Right rotation 37 pain on right 40 40  Left rotation 25 pain on right 38 pain 38   (Blank rows = not tested)   THORACIC ROM: Decreased rotation and extension by 50%  STRENGTH:  deep cervical flexor and extensor strength 4-/5 LUMBAR ROM:   AROM eval 9/3 10/2  Flexion 50% limited 25% limited 20% limited  Extension 75% limited 50% limited 50% limited  Right lateral flexion 25% limited  Left lateral flexion 25% limited    Right rotation     Left rotation      (Blank rows = not tested)  TRUNK STRENGTH:  Decreased activation of transverse abdominus muscles; abdominals 4-/5; decreased activation of lumbar multifidi; trunk extensors 4-/5   9/3:  trunk musculature 4/5 10/2:  trunk musculature 4/5     LOWER EXTREMITY MMT:  bil hip abduction 4-/5 with pelvic drop noted with single leg standing 10 sec 9/3:  bil hip abduction and extension 4/5  FUNCTIONAL TESTS:  Able to rise from standard chair without UE assist Able to do partial squat to pick up small item from the floor  GAIT: decreased stride length (dec hip flexor lengths)     TREATMENT DATE:  04/18/24 Discussion of cervical radicular patterns- she has symptoms particular in C6 dermatome but suspect other levels may also be affected Discussion of current status to determine best course of intervention today Trigger Point Dry Needling Subsequent Treatment: Instructions provided previously at initial dry needling treatment.  Patient Verbal Consent Given: Yes Education Handout Provided: Yes Muscles Treated: bil lumbar  multifidi, bil gluteals Electrical Stimulation Performed: Yes, Parameters: 2.0 ma 15 min  Treatment Response/Outcome: improved soft tissue mobility Manual therapy: Prone thoracic PA mobs grade 2 Periscapular soft tissue mobilization right > left particularly rhomboids and levator scap 04/04/24 Discussion of upcoming appts including NCV and assessment of proximal nerves as well as distal to determine where neural compromise exists (possibility of double crush syndrome) Discussion of current status to determine best course of intervention today Trigger Point Dry Needling Subsequent Treatment: Instructions provided previously at initial dry needling treatment.  Patient Verbal Consent Given: Yes Education Handout Provided: Yes Muscles Treated: right lumbar multifidi, right gluteals Electrical Stimulation Performed: Yes, Parameters: 2.0 ma 15 min  Treatment Response/Outcome: improved soft tissue mobility  Manual therapy: Prone thoracic PA mobs grade 2 Periscapular soft tissue mobilization bil particularly rhomboids and levator scap 04/04/24 Discussion of upcoming appts including NCV and assessment of proximal nerves as well as distal to determine where neural compromise exists (possibility of double crush syndrome) Discussion of current status to determine best course of intervention today Trigger Point Dry Needling Subsequent Treatment: Instructions provided previously at initial dry needling treatment.  Patient Verbal Consent Given: Yes Education Handout Provided: Yes Muscles Treated: right lumbar multifidi, right gluteals Electrical Stimulation Performed: Yes, Parameters: 2.0 ma 15 min  Treatment Response/Outcome: improved soft tissue mobility  Manual therapy: Prone thoracic PA mobs grade 2 Periscapular soft tissue mobilization bil particularly rhomboids and levator scap   TREATMENT DATE: 03/16/24   Review of HEP: discussed eliminating piriformis stretch and lumbar rotation as this  seemed to aggravate symptoms  Discussed symptoms and possibility of both peripheral nerve as well as nerve root compression could be occurring Discussed home interventions including massage gun and possibly restarting cervical traction Manual therapy: sidelying right gluteal soft tissue mobilization; pelvic distraction grade 3 5x, sidelying neutral gapping, gluteal and lumbar paraspinal soft tissue mobilization Manual therapy: right and left scapular mobilization glides and distraction for scapular dissociation Prone manual therapy: T6-T10 PA mobs and rotation mobs grade 3    TREATMENT DATE: 03/07/24   Pt brought prior HEPs for discussion on appropriate ones to focus on Seated sciatic neural floss 10x right/left  (Added to HEP- see below) Seated HS stretch 30 sec right/left 2-3x (Added to HEP- see below) Supine single knee to chest 20 sec 3x right/left (Added to HEP- see below) Supine double knee to  chest 20 sec 3x right/left (Added to HEP- see below) Supine lumbar rotation 10x right/left Manual therapy: sidelying pelvic distraction grade 3 5x, sidelying neutral gapping, gluteal and lumbar paraspinal soft tissue mobilization Addaday instrument assisted soft tissue mobilization used on right lateral hip to promote improved circulation and pain reduction; pt positioned in left S/L with PT monitoring response to treatment  TREATMENT DATE: 03/02/24   Discussion of MRI results and plan for today's session Trigger Point Dry Needling Subsequent Treatment: Instructions provided previously at initial dry needling treatment.  Patient Verbal Consent Given: Yes Education Handout Provided: Yes Muscles Treated: right lumbar multifidi, right gluteals Electrical Stimulation Performed: Yes, Parameters: 2.0 ma 15 min  Treatment Response/Outcome: improved soft tissue mobility Sidelying scapular mobilization medial and lateral, superior/inferior and distraction grade 3 10x each right Supine GH distraction  right grade 1/2 10x Prone thoracic PA mobs grade 2 Periscapular soft tissue mobilization bil  TREATMENT DATE: 02/22/24   Discussion of cervical radiculopathy symptoms and which nerves may be involved Supine cervical manual distraction 10x 10 sec holds Supine suboccipital distraction 3 min Sidelying scapular mobilization medial and lateral, superior/inferior and distraction grade 3 10x each right/left Supine upper trap stretches passively 3x right/left Prone thoracic PA mobs grade 2 Periscapular soft tissue mobilization bil  TREATMENT DATE: 02/03/24   ODI NDI Cervical, lumbar ROM Supine cervical manual distraction 10x 10 sec holds Supine suboccipital distraction 3 min Sidelying scapular mobilization medial and lateral, superior/inferior and distraction grade 3 10x each right/left Supine upper trap stretches passively 3x right/left Prone thoracic PA mobs grade 2 Periscapular soft tissue mobilization bil  TREATMENT DATE: 01/27/24   Discussion of current status and areas of focus Discussion of home strategies for pain management Supine cervical manual distraction 10x 10 sec holds Supine suboccipital distraction 3 min Sidelying scapular mobilization medial and lateral, superior/inferior and distraction grade 3 10x each right/left Supine upper trap stretches passively 3x right/left Supine glenohumeral distraction and Grade 1/2 oscillations inferior and posterior Prone thoracic PA mobs grade 2 Periscapular soft tissue mobilization bil  TREATMENT DATE: 01/20/24   Discussion of current status and areas of focus Home traction use: daily or even 2x/day ok; since it's static start with 5 minutes 5-8 pounds to test tolerance Home use of heat/massager chair regularly Supine cervical manual distraction 10x 10 sec holds Supine suboccipital distraction 5 min Supine upper trap stretches passively 3x right/left Supine glenohumeral distraction and Grade 1/2 oscillations inferior and  posterior Prone thoracic PA mobs grade 2 Periscapular soft tissue mobilization bil PATIENT EDUCATION:  Education details: Educated patient on anatomy and physiology of current symptoms, prognosis, plan of care as well as initial self care strategies to promote recovery Person educated: Patient Education method: Explanation Education comprehension: verbalized understanding  HOME EXERCISE PROGRAM: Access Code: [email or web address] URL: [email or web address] Date: 03/07/2024 Prepared by: Glade Pesa  Exercises - Seated Slump Nerve Glide  - 1 x daily - 7 x weekly - 1 sets - 10 reps - Seated Hamstring Stretch  - 1 x daily - 7 x weekly - 1 sets - 3 reps - 20-30 hold - Supine Single Knee to Chest Stretch  - 1 x daily - 7 x weekly - 1 sets - 3 reps - 20-30 hold - Supine Double Knee to Chest  - 1 x daily - 7 x weekly - 1 sets - 3 reps - 20-30 hold - Supine Lower Trunk Rotation  - 1 x daily - 7 x weekly - 1 sets - 10 reps Thoracic  extension seated with towel or ball ASSESSMENT:  CLINICAL IMPRESSION: Krista Austin responds well to PA mobs to thoracic joints as well as soft tissue mobilization to periscapular muscles with a temporary improvement in pain.  Her symptoms match a C6 radicular pattern.  Electrical stimulation to indwelling needles for further impacts on pain relief and neuromuscular changes.  Therapist monitoring response to all interventions and modifying treatment accordingly.     OBJECTIVE IMPAIRMENTS: decreased activity tolerance, difficulty walking, decreased ROM, decreased strength, increased fascial restrictions, impaired perceived functional ability, and pain.   ACTIVITY LIMITATIONS: lifting, bending, standing, squatting, and caring for others  PARTICIPATION LIMITATIONS: meal prep, cleaning, laundry, and community activity  PERSONAL FACTORS: 1-2 comorbidities: prior spinal pain history, diabetes are also affecting patient's functional outcome.   REHAB POTENTIAL:  Good  CLINICAL DECISION MAKING: Stable/uncomplicated  EVALUATION COMPLEXITY: Low   GOALS: Goals reviewed with patient? Yes  SHORT TERM GOALS: Target date: 11/19/2023   The patient will demonstrate knowledge of basic self care strategies and exercises to promote healing and recovery of function  Baseline: Goal status: met 7/31  2.  Pt will report at least 30% improvement in symptoms with light lifting, pushing and pulling tasks, sweeping and vacuuming  Baseline:  Goal status: met 9/3  3.  The patient will have improved hip strength to at least 4/5 needed for standing, walking longer distances and descending stairs at home and in the community  Baseline:  Goal status: met 9/3  4.  The patient will have improved trunk flexor and extensor muscle strength to at least 4/5 needed for lifting light to medium weight objects such as grocery bags  Baseline:  Goal status:met 9/3   LONG TERM GOALS: Target date:04/27/2024      The patient will be independent in a safe self progression of a home exercise program to promote further recovery of function  Baseline:  Goal status: ongoing  2.  Pt will report at least 60% improvement in symptoms with light to medium lifting, changing the sheets, sweeping and vacumning  Baseline:  Goal status: ongoing  3.  The patient will have improved hip strength to at least 4+/5 needed for standing, walking longer distances and descending stairs at home and in the community  Baseline:  Goal status: ongoing  4.  The patient will have improved trunk flexor and extensor muscle strength to at least 4+/5 needed for lifting medium weight objects such as laundry and luggage  Baseline:  Goal status: ongoing  5.  Modified Oswestry functional outcome measure score improved to    16 % indicating improved function with ADLS with less pain.   Baseline:  Goal status: ongoing  6. Neck Disability Index improved to 38% indicating improved function with less  pain ongoing 7. Improved cervical rotation to 45 degrees needed for driving ongoing  8. Sleep improved by 25%, able to sleep in the bed new    PLAN:  PT FREQUENCY: 1x/week  PT DURATION: 8 weeks  PLANNED INTERVENTIONS: 97164- PT Re-evaluation, 97110-Therapeutic exercises, 97530- Therapeutic activity, 97112- Neuromuscular re-education, 97535- Self Care, 02859- Manual therapy, [phone]- Aquatic Therapy, [id number]- Electrical stimulation (unattended), [phone]- Electrical stimulation (manual), [id number]- Ultrasound, [id number]- Traction (mechanical), [id number]- Ionotophoresis 4mg /ml Dexamethasone , 79439 (1-2 muscles), 20561 (3+ muscles)- Dry Needling, Patient/Family education, Taping, Joint mobilization, Spinal mobilization, Cryotherapy, and Moist heat.  PLAN FOR NEXT SESSION: ERO in 1 week; review and progress HEP;  manual techniques to lumbar, cervical region and periscapular muscles; thoracic mobility; DN with ES to lumbar and  gluteal regions  Glade Pesa, PT 04/18/2024 9:01 PM Phone: [phone] Fax: [phone]   PHYSICAL THERAPY DISCHARGE SUMMARY  Visits from Start of Care: 15  Current functional level related to goals / functional outcomes: The patient is having carpal tunnel surgeries and interventions for lumbar spine including injections and maybe surgery.  Her neurosurgeon recommended discharge from PT at this time.   Remaining deficits: As above   Education / Equipment: As above   Patient agrees to discharge. Patient goals were partially met. Patient is being discharged due to a change in medical status.  Glade Pesa, PT 05/19/24 11:42 AM Phone: [phone] Fax: [phone]               "

## 2024-05-02 ENCOUNTER — Ambulatory Visit: Admitting: Physical Therapy

## 2024-05-16 ENCOUNTER — Ambulatory Visit: Admitting: Physical Therapy

## 2024-05-23 ENCOUNTER — Ambulatory Visit: Admitting: Physical Therapy

## 2024-05-30 ENCOUNTER — Ambulatory Visit: Admitting: Physical Therapy

## 2024-09-04 ENCOUNTER — Ambulatory Visit: Admitting: Cardiology
